# Patient Record
Sex: Female | Born: 1980 | Race: White | Hispanic: No | Marital: Married | Smoking: Current every day smoker
Health system: Southern US, Community
[De-identification: ages and names within clinical notes are randomized; demographics above are authoritative.]

## PROBLEM LIST (undated history)

## (undated) DIAGNOSIS — F1110 Opioid abuse, uncomplicated: Secondary | ICD-10-CM

## (undated) DIAGNOSIS — G8929 Other chronic pain: Secondary | ICD-10-CM

## (undated) DIAGNOSIS — R011 Cardiac murmur, unspecified: Secondary | ICD-10-CM

## (undated) DIAGNOSIS — J189 Pneumonia, unspecified organism: Secondary | ICD-10-CM

## (undated) DIAGNOSIS — F1490 Cocaine use, unspecified, uncomplicated: Secondary | ICD-10-CM

## (undated) DIAGNOSIS — F1111 Opioid abuse, in remission: Secondary | ICD-10-CM

## (undated) DIAGNOSIS — F419 Anxiety disorder, unspecified: Secondary | ICD-10-CM

## (undated) DIAGNOSIS — M549 Dorsalgia, unspecified: Secondary | ICD-10-CM

## (undated) DIAGNOSIS — N809 Endometriosis, unspecified: Secondary | ICD-10-CM

## (undated) DIAGNOSIS — E876 Hypokalemia: Secondary | ICD-10-CM

## (undated) DIAGNOSIS — A6009 Herpesviral infection of other urogenital tract: Secondary | ICD-10-CM

## (undated) DIAGNOSIS — R102 Pelvic and perineal pain: Secondary | ICD-10-CM

## (undated) DIAGNOSIS — R51 Headache: Secondary | ICD-10-CM

## (undated) DIAGNOSIS — N289 Disorder of kidney and ureter, unspecified: Secondary | ICD-10-CM

## (undated) DIAGNOSIS — R519 Headache, unspecified: Secondary | ICD-10-CM

## (undated) HISTORY — PX: TUBAL LIGATION: SHX77

## (undated) HISTORY — DX: Herpesviral infection of other urogenital tract: A60.09

## (undated) HISTORY — PX: CARPAL TUNNEL RELEASE: SHX101

## (undated) HISTORY — PX: TONSILLECTOMY: SUR1361

---

## 2000-08-01 ENCOUNTER — Inpatient Hospital Stay (HOSPITAL_COMMUNITY): Admission: AD | Admit: 2000-08-01 | Discharge: 2000-08-01 | Payer: Self-pay | Admitting: *Deleted

## 2001-08-21 ENCOUNTER — Encounter: Payer: Self-pay | Admitting: Emergency Medicine

## 2001-08-21 ENCOUNTER — Observation Stay (HOSPITAL_COMMUNITY): Admission: EM | Admit: 2001-08-21 | Discharge: 2001-08-21 | Payer: Self-pay | Admitting: Emergency Medicine

## 2003-01-28 ENCOUNTER — Emergency Department (HOSPITAL_COMMUNITY): Admission: EM | Admit: 2003-01-28 | Discharge: 2003-01-28 | Payer: Self-pay | Admitting: Emergency Medicine

## 2003-08-06 ENCOUNTER — Ambulatory Visit (HOSPITAL_COMMUNITY): Admission: AD | Admit: 2003-08-06 | Discharge: 2003-08-06 | Payer: Self-pay | Admitting: Obstetrics and Gynecology

## 2003-09-18 ENCOUNTER — Inpatient Hospital Stay (HOSPITAL_COMMUNITY): Admission: RE | Admit: 2003-09-18 | Discharge: 2003-09-20 | Payer: Self-pay | Admitting: Obstetrics and Gynecology

## 2003-10-23 ENCOUNTER — Ambulatory Visit (HOSPITAL_COMMUNITY): Admission: RE | Admit: 2003-10-23 | Discharge: 2003-10-23 | Payer: Self-pay | Admitting: Obstetrics and Gynecology

## 2005-02-13 ENCOUNTER — Ambulatory Visit: Payer: Self-pay | Admitting: Cardiology

## 2005-03-10 HISTORY — PX: ABDOMINAL HYSTERECTOMY: SHX81

## 2005-08-13 ENCOUNTER — Observation Stay (HOSPITAL_COMMUNITY): Admission: RE | Admit: 2005-08-13 | Discharge: 2005-08-14 | Payer: Self-pay | Admitting: Obstetrics & Gynecology

## 2005-08-13 ENCOUNTER — Encounter (INDEPENDENT_AMBULATORY_CARE_PROVIDER_SITE_OTHER): Payer: Self-pay | Admitting: Specialist

## 2006-02-18 ENCOUNTER — Ambulatory Visit: Payer: Self-pay | Admitting: Cardiology

## 2006-03-11 ENCOUNTER — Ambulatory Visit: Payer: Self-pay | Admitting: Cardiology

## 2006-05-23 ENCOUNTER — Emergency Department (HOSPITAL_COMMUNITY): Admission: EM | Admit: 2006-05-23 | Discharge: 2006-05-23 | Payer: Self-pay | Admitting: Emergency Medicine

## 2006-06-22 ENCOUNTER — Emergency Department (HOSPITAL_COMMUNITY): Admission: EM | Admit: 2006-06-22 | Discharge: 2006-06-22 | Payer: Self-pay | Admitting: Emergency Medicine

## 2006-08-05 ENCOUNTER — Ambulatory Visit (HOSPITAL_COMMUNITY): Admission: RE | Admit: 2006-08-05 | Discharge: 2006-08-05 | Payer: Self-pay | Admitting: Obstetrics and Gynecology

## 2006-08-30 ENCOUNTER — Emergency Department (HOSPITAL_COMMUNITY): Admission: EM | Admit: 2006-08-30 | Discharge: 2006-08-30 | Payer: Self-pay | Admitting: Emergency Medicine

## 2006-09-29 ENCOUNTER — Ambulatory Visit (HOSPITAL_BASED_OUTPATIENT_CLINIC_OR_DEPARTMENT_OTHER): Admission: RE | Admit: 2006-09-29 | Discharge: 2006-09-29 | Payer: Self-pay | Admitting: Orthopedic Surgery

## 2007-01-20 ENCOUNTER — Ambulatory Visit (HOSPITAL_BASED_OUTPATIENT_CLINIC_OR_DEPARTMENT_OTHER): Admission: RE | Admit: 2007-01-20 | Discharge: 2007-01-20 | Payer: Self-pay | Admitting: Orthopedic Surgery

## 2008-03-10 HISTORY — PX: CHOLECYSTECTOMY: SHX55

## 2008-08-28 ENCOUNTER — Emergency Department (HOSPITAL_COMMUNITY): Admission: EM | Admit: 2008-08-28 | Discharge: 2008-08-28 | Payer: Self-pay | Admitting: Emergency Medicine

## 2008-10-27 ENCOUNTER — Inpatient Hospital Stay (HOSPITAL_COMMUNITY): Admission: EM | Admit: 2008-10-27 | Discharge: 2008-10-31 | Payer: Self-pay | Admitting: Emergency Medicine

## 2008-10-30 ENCOUNTER — Encounter (INDEPENDENT_AMBULATORY_CARE_PROVIDER_SITE_OTHER): Payer: Self-pay | Admitting: General Surgery

## 2008-11-20 ENCOUNTER — Emergency Department (HOSPITAL_COMMUNITY): Admission: EM | Admit: 2008-11-20 | Discharge: 2008-11-20 | Payer: Self-pay | Admitting: Emergency Medicine

## 2008-12-17 ENCOUNTER — Emergency Department (HOSPITAL_COMMUNITY): Admission: EM | Admit: 2008-12-17 | Discharge: 2008-12-17 | Payer: Self-pay | Admitting: Emergency Medicine

## 2009-09-11 ENCOUNTER — Emergency Department (HOSPITAL_COMMUNITY): Admission: EM | Admit: 2009-09-11 | Discharge: 2009-09-11 | Payer: Self-pay | Admitting: Emergency Medicine

## 2009-10-18 ENCOUNTER — Emergency Department (HOSPITAL_COMMUNITY): Admission: EM | Admit: 2009-10-18 | Discharge: 2009-10-18 | Payer: Self-pay | Admitting: Emergency Medicine

## 2009-11-04 ENCOUNTER — Emergency Department (HOSPITAL_COMMUNITY): Admission: EM | Admit: 2009-11-04 | Discharge: 2009-11-04 | Payer: Self-pay | Admitting: Emergency Medicine

## 2009-11-20 ENCOUNTER — Emergency Department (HOSPITAL_COMMUNITY): Admission: EM | Admit: 2009-11-20 | Discharge: 2009-11-20 | Payer: Self-pay | Admitting: Emergency Medicine

## 2009-11-30 ENCOUNTER — Emergency Department (HOSPITAL_COMMUNITY): Admission: EM | Admit: 2009-11-30 | Discharge: 2009-11-30 | Payer: Self-pay | Admitting: Emergency Medicine

## 2009-12-07 ENCOUNTER — Emergency Department (HOSPITAL_COMMUNITY): Admission: EM | Admit: 2009-12-07 | Discharge: 2009-12-07 | Payer: Self-pay | Admitting: Emergency Medicine

## 2009-12-10 ENCOUNTER — Ambulatory Visit (HOSPITAL_COMMUNITY): Admission: RE | Admit: 2009-12-10 | Discharge: 2009-12-10 | Payer: Self-pay | Admitting: Obstetrics and Gynecology

## 2010-01-04 ENCOUNTER — Emergency Department (HOSPITAL_COMMUNITY): Admission: EM | Admit: 2010-01-04 | Discharge: 2010-01-04 | Payer: Self-pay | Admitting: Emergency Medicine

## 2010-02-01 ENCOUNTER — Emergency Department (HOSPITAL_COMMUNITY): Admission: EM | Admit: 2010-02-01 | Discharge: 2010-02-01 | Payer: Self-pay | Admitting: Emergency Medicine

## 2010-02-02 ENCOUNTER — Emergency Department (HOSPITAL_COMMUNITY): Admission: EM | Admit: 2010-02-02 | Discharge: 2010-02-02 | Payer: Self-pay | Admitting: Emergency Medicine

## 2010-02-07 ENCOUNTER — Emergency Department (HOSPITAL_COMMUNITY)
Admission: EM | Admit: 2010-02-07 | Discharge: 2010-02-07 | Payer: Self-pay | Source: Home / Self Care | Admitting: Emergency Medicine

## 2010-05-20 LAB — RAPID URINE DRUG SCREEN, HOSP PERFORMED
Amphetamines: NOT DETECTED
Barbiturates: NOT DETECTED
Cocaine: NOT DETECTED
Opiates: NOT DETECTED
Tetrahydrocannabinol: NOT DETECTED

## 2010-05-20 LAB — BASIC METABOLIC PANEL
BUN: 2 mg/dL — ABNORMAL LOW (ref 6–23)
Chloride: 98 mEq/L (ref 96–112)
GFR calc Af Amer: >60 mL/min (ref >60)
Glucose, Bld: 121 mg/dL — ABNORMAL HIGH (ref 70–99)
Potassium: 2.8 mEq/L — ABNORMAL LOW (ref 3.5–5.1)

## 2010-05-20 LAB — DIFFERENTIAL
Basophils Absolute: 0 10*3/uL (ref 0.0–0.1)
Eosinophils Absolute: 0.2 10*3/uL (ref 0.0–0.7)
Eosinophils Relative: 2 % (ref 0–5)
Lymphocytes Relative: 18 % (ref 12–46)
Monocytes Absolute: 0.9 10*3/uL (ref 0.1–1.0)
Monocytes Relative: 8 % (ref 3–12)

## 2010-05-20 LAB — URINALYSIS, ROUTINE W REFLEX MICROSCOPIC
Bilirubin Urine: NEGATIVE
Glucose, UA: NEGATIVE mg/dL
Ketones, ur: NEGATIVE mg/dL
Specific Gravity, Urine: >1.03 — ABNORMAL HIGH (ref 1.005–1.030)
Urobilinogen, UA: 1 mg/dL (ref 0.0–1.0)
pH: 6.5 (ref 5.0–8.0)

## 2010-05-20 LAB — CBC
Hemoglobin: 15.3 g/dL — ABNORMAL HIGH (ref 12.0–15.0)
MCV: 98.8 fL (ref 78.0–100.0)
Platelets: 249 10*3/uL (ref 150–400)
RDW: 11.9 % (ref 11.5–15.5)
WBC: 11.6 10*3/uL — ABNORMAL HIGH (ref 4.0–10.5)

## 2010-05-23 LAB — SURGICAL PCR SCREEN
MRSA, PCR: NEGATIVE
Staphylococcus aureus: NEGATIVE

## 2010-05-23 LAB — BASIC METABOLIC PANEL
CO2: 28 mEq/L (ref 19–32)
Calcium: 8.9 mg/dL (ref 8.4–10.5)
Creatinine, Ser: 0.7 mg/dL (ref 0.4–1.2)
GFR calc non Af Amer: >60 mL/min (ref >60)
Sodium: 141 mEq/L (ref 135–145)

## 2010-05-23 LAB — CBC
HCT: 40.2 % (ref 36.0–46.0)
Platelets: 282 10*3/uL (ref 150–400)

## 2010-05-24 LAB — URINALYSIS, ROUTINE W REFLEX MICROSCOPIC
Hgb urine dipstick: NEGATIVE
Ketones, ur: NEGATIVE mg/dL
Nitrite: NEGATIVE
Protein, ur: NEGATIVE mg/dL
Specific Gravity, Urine: 1.02 (ref 1.005–1.030)
pH: 7 (ref 5.0–8.0)

## 2010-05-26 LAB — BASIC METABOLIC PANEL
BUN: 5 mg/dL — ABNORMAL LOW (ref 6–23)
CO2: 28 mEq/L (ref 19–32)
Calcium: 9 mg/dL (ref 8.4–10.5)
Chloride: 102 mEq/L (ref 96–112)
Creatinine, Ser: 0.6 mg/dL (ref 0.4–1.2)
GFR calc Af Amer: >60 mL/min (ref >60)
Potassium: 4.1 mEq/L (ref 3.5–5.1)

## 2010-05-26 LAB — CBC
Hemoglobin: 14.1 g/dL (ref 12.0–15.0)
MCH: 34.1 pg — ABNORMAL HIGH (ref 26.0–34.0)

## 2010-05-26 LAB — DIFFERENTIAL
Basophils Absolute: 0.1 10*3/uL (ref 0.0–0.1)
Basophils Relative: 1 % (ref 0–1)
Eosinophils Relative: 0 % (ref 0–5)
Monocytes Relative: 4 % (ref 3–12)
Neutro Abs: 5.8 10*3/uL (ref 1.7–7.7)

## 2010-05-26 LAB — POCT PREGNANCY, URINE: Preg Test, Ur: NEGATIVE

## 2010-05-26 LAB — URINALYSIS, ROUTINE W REFLEX MICROSCOPIC
Bilirubin Urine: NEGATIVE
Hgb urine dipstick: NEGATIVE
Ketones, ur: NEGATIVE mg/dL
Protein, ur: NEGATIVE mg/dL
pH: 5 (ref 5.0–8.0)

## 2010-05-26 LAB — HEPATIC FUNCTION PANEL
ALT: 11 U/L (ref 0–35)
AST: 15 U/L (ref 0–37)
Total Bilirubin: 0.7 mg/dL (ref 0.3–1.2)
Total Protein: 6.7 g/dL (ref 6.0–8.3)

## 2010-05-26 LAB — LIPASE, BLOOD: Lipase: 20 U/L (ref 11–59)

## 2010-06-13 LAB — BASIC METABOLIC PANEL
Calcium: 9.5 mg/dL (ref 8.4–10.5)
Chloride: 107 mEq/L (ref 96–112)
Glucose, Bld: 99 mg/dL (ref 70–99)

## 2010-06-13 LAB — CBC
HCT: 40.9 % (ref 36.0–46.0)
Hemoglobin: 14.1 g/dL (ref 12.0–15.0)
MCHC: 34.6 g/dL (ref 30.0–36.0)
MCV: 100.7 fL — ABNORMAL HIGH (ref 78.0–100.0)
Platelets: 308 10*3/uL (ref 150–400)
RBC: 4.06 MIL/uL (ref 3.87–5.11)
RDW: 13 % (ref 11.5–15.5)
WBC: 9.7 10*3/uL (ref 4.0–10.5)

## 2010-06-13 LAB — DIFFERENTIAL
Basophils Relative: 1 % (ref 0–1)
Eosinophils Absolute: 0 10*3/uL (ref 0.0–0.7)
Lymphs Abs: 1.9 10*3/uL (ref 0.7–4.0)
Monocytes Relative: 4 % (ref 3–12)
Neutrophils Relative %: 75 % (ref 43–77)

## 2010-06-14 LAB — DIFFERENTIAL
Basophils Relative: 0 % (ref 0–1)
Eosinophils Relative: 0 % (ref 0–5)
Monocytes Absolute: 0.3 10*3/uL (ref 0.1–1.0)
Monocytes Relative: 3 % (ref 3–12)
Neutro Abs: 9.3 10*3/uL — ABNORMAL HIGH (ref 1.7–7.7)

## 2010-06-14 LAB — BASIC METABOLIC PANEL
Chloride: 101 mEq/L (ref 96–112)
GFR calc Af Amer: >60 mL/min (ref >60)
Glucose, Bld: 101 mg/dL — ABNORMAL HIGH (ref 70–99)
Potassium: 4 mEq/L (ref 3.5–5.1)

## 2010-06-14 LAB — CBC
HCT: 43.6 % (ref 36.0–46.0)
Hemoglobin: 15.3 g/dL — ABNORMAL HIGH (ref 12.0–15.0)
MCHC: 34.9 g/dL (ref 30.0–36.0)
MCV: 100.8 fL — ABNORMAL HIGH (ref 78.0–100.0)
RBC: 4.33 MIL/uL (ref 3.87–5.11)

## 2010-06-15 LAB — CBC
HCT: 32.4 % — ABNORMAL LOW (ref 36.0–46.0)
HCT: 40.1 % (ref 36.0–46.0)
Hemoglobin: 11.5 g/dL — ABNORMAL LOW (ref 12.0–15.0)
MCHC: 35.1 g/dL (ref 30.0–36.0)
MCV: 101.3 fL — ABNORMAL HIGH (ref 78.0–100.0)
MCV: 101.8 fL — ABNORMAL HIGH (ref 78.0–100.0)
Platelets: 170 10*3/uL (ref 150–400)
Platelets: 190 10*3/uL (ref 150–400)
Platelets: 234 10*3/uL (ref 150–400)
RBC: 3.96 MIL/uL (ref 3.87–5.11)
RDW: 12.2 % (ref 11.5–15.5)
WBC: 12.1 10*3/uL — ABNORMAL HIGH (ref 4.0–10.5)
WBC: 6.9 10*3/uL (ref 4.0–10.5)
WBC: 8 10*3/uL (ref 4.0–10.5)

## 2010-06-15 LAB — BASIC METABOLIC PANEL
BUN: 1 mg/dL — ABNORMAL LOW (ref 6–23)
BUN: 5 mg/dL — ABNORMAL LOW (ref 6–23)
CO2: 27 mEq/L (ref 19–32)
Chloride: 105 mEq/L (ref 96–112)
Creatinine, Ser: 0.65 mg/dL (ref 0.4–1.2)
GFR calc non Af Amer: >60 mL/min (ref >60)
Glucose, Bld: 87 mg/dL (ref 70–99)
Potassium: 3.9 mEq/L (ref 3.5–5.1)
Sodium: 142 mEq/L (ref 135–145)

## 2010-06-15 LAB — HEPATIC FUNCTION PANEL
ALT: 10 U/L (ref 0–35)
AST: 11 U/L (ref 0–37)
Albumin: 3.4 g/dL — ABNORMAL LOW (ref 3.5–5.2)
Alkaline Phosphatase: 50 U/L (ref 39–117)
Indirect Bilirubin: 0.3 mg/dL (ref 0.3–0.9)
Indirect Bilirubin: 0.4 mg/dL (ref 0.3–0.9)
Total Protein: 5 g/dL — ABNORMAL LOW (ref 6.0–8.3)
Total Protein: 6 g/dL (ref 6.0–8.3)

## 2010-06-15 LAB — COMPREHENSIVE METABOLIC PANEL
BUN: 4 mg/dL — ABNORMAL LOW (ref 6–23)
CO2: 25 mEq/L (ref 19–32)
Chloride: 110 mEq/L (ref 96–112)
Creatinine, Ser: 0.49 mg/dL (ref 0.4–1.2)
GFR calc non Af Amer: >60 mL/min (ref >60)
Total Bilirubin: 0.7 mg/dL (ref 0.3–1.2)

## 2010-06-15 LAB — DIFFERENTIAL
Basophils Absolute: 0 10*3/uL (ref 0.0–0.1)
Basophils Absolute: 0.1 10*3/uL (ref 0.0–0.1)
Basophils Relative: 0 % (ref 0–1)
Eosinophils Absolute: 0.1 10*3/uL (ref 0.0–0.7)
Eosinophils Relative: 0 % (ref 0–5)
Eosinophils Relative: 1 % (ref 0–5)
Lymphocytes Relative: 29 % (ref 12–46)
Lymphocytes Relative: 38 % (ref 12–46)
Lymphs Abs: 2.6 10*3/uL (ref 0.7–4.0)
Neutro Abs: 5.2 10*3/uL (ref 1.7–7.7)
Neutrophils Relative %: 71 % (ref 43–77)

## 2010-06-15 LAB — URINALYSIS, ROUTINE W REFLEX MICROSCOPIC
Glucose, UA: NEGATIVE mg/dL
Hgb urine dipstick: NEGATIVE
Protein, ur: NEGATIVE mg/dL

## 2010-06-15 LAB — LIPASE, BLOOD: Lipase: 23 U/L (ref 11–59)

## 2010-07-05 ENCOUNTER — Emergency Department (HOSPITAL_COMMUNITY)
Admission: EM | Admit: 2010-07-05 | Discharge: 2010-07-05 | Disposition: A | Discharge status: Home / Self Care | Payer: BC Managed Care – PPO | Attending: Emergency Medicine | Admitting: Emergency Medicine

## 2010-07-05 DIAGNOSIS — R51 Headache: Secondary | ICD-10-CM | POA: Insufficient documentation

## 2010-07-05 DIAGNOSIS — F3289 Other specified depressive episodes: Secondary | ICD-10-CM | POA: Insufficient documentation

## 2010-07-05 DIAGNOSIS — F329 Major depressive disorder, single episode, unspecified: Secondary | ICD-10-CM | POA: Insufficient documentation

## 2010-07-05 DIAGNOSIS — F411 Generalized anxiety disorder: Secondary | ICD-10-CM | POA: Insufficient documentation

## 2010-07-15 ENCOUNTER — Emergency Department (HOSPITAL_COMMUNITY)
Admission: EM | Admit: 2010-07-15 | Discharge: 2010-07-15 | Disposition: A | Discharge status: Home / Self Care | Payer: BC Managed Care – PPO | Attending: Emergency Medicine | Admitting: Emergency Medicine

## 2010-07-15 DIAGNOSIS — F411 Generalized anxiety disorder: Secondary | ICD-10-CM | POA: Insufficient documentation

## 2010-07-15 DIAGNOSIS — B370 Candidal stomatitis: Secondary | ICD-10-CM | POA: Insufficient documentation

## 2010-07-22 ENCOUNTER — Emergency Department (HOSPITAL_COMMUNITY): Payer: BC Managed Care – PPO

## 2010-07-22 ENCOUNTER — Emergency Department (HOSPITAL_COMMUNITY)
Admission: EM | Admit: 2010-07-22 | Discharge: 2010-07-22 | Disposition: A | Discharge status: Home / Self Care | Payer: BC Managed Care – PPO | Attending: Emergency Medicine | Admitting: Emergency Medicine

## 2010-07-22 DIAGNOSIS — M25579 Pain in unspecified ankle and joints of unspecified foot: Secondary | ICD-10-CM | POA: Insufficient documentation

## 2010-07-22 DIAGNOSIS — M25473 Effusion, unspecified ankle: Secondary | ICD-10-CM | POA: Insufficient documentation

## 2010-07-22 DIAGNOSIS — X500XXA Overexertion from strenuous movement or load, initial encounter: Secondary | ICD-10-CM | POA: Insufficient documentation

## 2010-07-22 DIAGNOSIS — F341 Dysthymic disorder: Secondary | ICD-10-CM | POA: Insufficient documentation

## 2010-07-22 DIAGNOSIS — S93409A Sprain of unspecified ligament of unspecified ankle, initial encounter: Secondary | ICD-10-CM | POA: Insufficient documentation

## 2010-07-22 DIAGNOSIS — Z9889 Other specified postprocedural states: Secondary | ICD-10-CM | POA: Insufficient documentation

## 2010-07-22 DIAGNOSIS — M25476 Effusion, unspecified foot: Secondary | ICD-10-CM | POA: Insufficient documentation

## 2010-07-22 DIAGNOSIS — Z79899 Other long term (current) drug therapy: Secondary | ICD-10-CM | POA: Insufficient documentation

## 2010-07-22 DIAGNOSIS — Y92009 Unspecified place in unspecified non-institutional (private) residence as the place of occurrence of the external cause: Secondary | ICD-10-CM | POA: Insufficient documentation

## 2010-07-23 NOTE — Op Note (Signed)
NAMEJONELL, [name]                  ACCOUNT NO.:  [id number]   MEDICAL RECORD NO.:  [id number]          PATIENT TYPE:  AMB   LOCATION:  DSC                          FACILITY:  [facility]   PHYSICIAN:  Cindee Salt, M.D.       DATE OF BIRTH:  05-03-1980   DATE OF PROCEDURE:  01/20/2007  DATE OF DISCHARGE:  01/20/2007                               OPERATIVE REPORT   PREOPERATIVE DIAGNOSIS:  Carpal tunnel syndrome, right hand.   POSTOPERATIVE DIAGNOSIS:  Carpal tunnel syndrome, right hand.   OPERATION:  Decompression of right median nerve.   SURGEON:  Cindee Salt, M.D.   ANESTHESIA:  General.   ANESTHESIOLOGIST:  Kaylyn Layer. Michelle Piper, M.D.   HISTORY:  The patient is a 30 year old female with history of carpal  tunnel syndrome, EMG nerve conductions positive.  This has not responded  to conservative treatment.  She has undergone release on the left side  and is admitted now for release on the right.  In the preoperative area  the patient is seen, questions encouraged and answered, extremity marked  by both the patient and surgeon.  Antibiotic given.   PROCEDURE:  The patient is brought to the operating room where forearm  based IV regional anesthesia was attempted.  This was unsuccessful and  LMA general anesthetic was then used.  She was prepped using DuraPrep,  supine position, right arm free.  The limb was exsanguinated with an  Esmarch bandage, tourniquet on the forearm was inflated to 200 mmHg.  Straight incision was made longitudinally in palm carried down through  subcutaneous tissue.  Bleeders were electrocauterized, palmar fascia was  split, superficial palmar arch identified, flexor tendon of the ring and  little finger identified to the ulnar side of median nerve.  Carpal  retinaculum was incised with sharp dissection, right angle and Sewall  retractor were placed between skin and forearm fascia.  The fascia  released for approximately 1.5 cm proximal to the wrist crease under  direct vision.  The canal was explored.  Area of compression to the  nerve was apparent.  No further lesions were identified.  The wound was  irrigated.  The skin was then closed with interrupted 5-0 Vicryl Rapide  sutures.  A sterile compressive dressing and splint to the wrist  applied, fingers free.  The patient tolerated the procedure well and was  taken to the recovery room for observation in satisfactory condition.  She will be discharged home to return to hand surgeon in [place] in 1  week on Vicodin.           ______________________________  Cindee Salt, M.D.     GK/MEDQ  D:  01/20/2007  T:  01/21/2007  Job:  [id number]   cc:   Kingsley Callander. Ouida Sills, MD

## 2010-07-23 NOTE — Op Note (Signed)
[name], [name]                  ACCOUNT NO.:  [id number]   MEDICAL RECORD NO.:  [id number]          PATIENT TYPE:  INP   LOCATION:  A312                          FACILITY:  [facility]   PHYSICIAN:  Barbaraann Barthel, M.D. DATE OF BIRTH:  [birth year]   DATE OF PROCEDURE:  10/30/2008  DATE OF DISCHARGE:                               OPERATIVE REPORT   PREOPERATIVE DIAGNOSIS:  Acute cholecystitis and cholelithiasis.   POSTOPERATIVE DIAGNOSIS:  Acute cholecystitis and cholelithiasis   PROCEDURE:  Laparoscopic cholecystectomy.   Note, this is a 30 year old white female who was admitted through the  emergency room with severe right upper quadrant pain.  She had multiple  episodes of this.  Sonogram revealed cholelithiasis.  There was evidence  of gallstones, but no biliary duct dilatation.  There were no active  findings.  No definite gallbladder wall thickening; however, the patient  had considerable pain requiring Dilaudid for pain control.  She was  admitted.  IVs were initiated and antibiotics and we followed her  clinically until her pain somewhat diminished and then planned for  surgery on her on Monday morning and after discussing complications of  surgery not limited to, but including bleeding, infection, damage to  bile ducts, perforation of organs and transitory diarrhea.   GROSS OPERATIVE FINDINGS:  Consistent with acute cholecystitis.  She had  some edema of the gallbladder wall.  There were stones also within the  gallbladder.  The liver also appeared to have fatty infiltration.  There  was a small cystic duct, which was not cannulated for cholangiogram.   A wound classification contaminated because of diagnosis of acute  cholecystitis.   SPECIMEN:  Gallbladder and stones.   TECHNIQUE:  The patient was placed in supine position.  After adequate  administration of general anesthesia via endotracheal intubation, her  entire abdomen was prepped with Betadine solution and draped  in the  usual manner.  Prior to this, Foley catheter was aseptically inserted.  A periumbilical incision was carried out over the superior aspect of the  umbilicus.  The fascia was grasped with a sharp towel clip and elevated  and a Veress needle was inserted and confirmed the position with a  saline drop test.  We insufflated the abdomen and then using the  Visiport technique placed a 11 mm cannula in the umbilicus.  We noted  considerable air bubbles within the parietal peritoneum obviously during  insufflation.  The Veress needle that had dissected around the hepatic  ligament and the parietal peritoneum, but there was no bowel dilatation  or any sign of any mishap from the insertion other than just the gas.  We proceeded to take the gallbladder out grasping it and clearly  visualizing the cystic duct and clipping this 3 times and dividing this  as well as the cystic artery.  The gallbladder was removed from the  liver bed without spillage and using the EndoCatch device was removed  through the epigastric incision.  We checked for hemostasis.  We then  elected to leave a Jackson-Pratt drain in the liver bed as well  as a  piece of Surgicel.  Again, there was no signs of any bowel mishap on  cannulization of the abdomen.  I put the camera in the epigastric  incision after the procedure and carefully looked at the abdomen and  there was no signs of any succus entericus or anything other than the  above-mentioned air bubbles from the parietal peritoneum.  We then  desufflated the abdomen and closed the fascia in the area of the  umbilicus with O Polysorb suture.  There was a very small cannula, a  hole in the epigastric area, this was not closed.  I used 0.5%  Sensorcaine around port sites and then closed all the wounds with a  stapling device.  The drain was sutured in place with 3-0 nylon. Prior  to closure, all sponge, needle, instrument counts found to be correct.  Estimated blood loss  was minimal.  The patient received a liter of  crystalloids intraoperatively and as stated one Jackson-Pratt drain was  left in the liver bed.  Prior to closure, all sponge, needle, and  instrument counts were accounted for.      Barbaraann Barthel, M.D.  Electronically Signed     WB/MEDQ  D:  10/30/2008  T:  10/31/2008  Job:  [id number]

## 2010-07-23 NOTE — H&P (Signed)
NAMEDAPHNE, KARRER                  ACCOUNT NO.:  [id number]   MEDICAL RECORD NO.:  [id number]          PATIENT TYPE:  INP   LOCATION:  A312                          FACILITY:  [facility]   PHYSICIAN:  Barbaraann Barthel, M.D. DATE OF BIRTH:  02/04/1981   DATE OF ADMISSION:  10/27/2008  DATE OF DISCHARGE:  LH                              HISTORY & PHYSICAL   Surgeon was asked to see this 30 year old white female with recurrent  right upper quadrant pain.  The chief complaint is that of postprandial  right upper quadrant pain accompanied with nausea and vomiting.  She has  had several episodes of this and her latest one has been worse today.  Clinically, this pain is localized in the right upper quadrant and  radiates to her back and has occurred after eating a greasy meal.   PHYSICAL EXAMINATION:  GENERAL:  A pleasant 30 year old white female,  who is uncomfortable, but in no acute distress.  VITAL SIGNS:  She is 5 feet 1 inch tall, weighs 48.2 kg, her temperature  is 97.6, blood pressure 99/52, heart rate 47, respirations 20, and O2  sat on room air is 99%.  HEENT:  Head is normocephalic.  Eyes, extraocular movements are intact.  Pupils are round and reactive to light and accommodation.  There is no  conjunctive pallor or scleral injection.  The sclera is a normal  tincture.  NECK:  Supple and cylindrical without jugular vein distention,  thyromegaly, or tracheal deviation.  There is no adenopathy.  CHEST:  Clear to both anterior and posterior auscultation.  HEART:  Regular rhythm.  BREASTS AND AXILLA:  Without masses.  ABDOMEN:  Tender in the right upper quadrant.  Bowel sounds are somewhat  diminished.  There are no femoral or inguinal hernias.  The patient has  had previous perforation, piercing of her umbilicus.  RECTAL:  Stool is guaiac-negative.  EXTREMITIES:  Within normal limits.  She has a past history of a  fracture of her left ankle.   REVIEW OF SYSTEMS:  GI SYSTEM:  The  right upper quadrant postprandial  pain with nausea and vomiting.  She has had several episodes of this,  this is the worse today.  She has had no past history of hepatitis, no  past history of bright red rectal bleeding, or black tarry stools, and  no unexplained weight loss.  No past history of hepatitis.  GU SYSTEM:  No history of nephrolithiasis, although her family has had history and  she has no history of dysuria.  ENDOCRINE SYSTEM:  No history of  diabetes or thyroid disease.  MUSCULOSKELETAL SYSTEM:  Grossly within  normal limits.  CARDIORESPIRATORY SYSTEM:  The patient has had a  childhood murmur in the past; however, there is no murmur at present.   SOCIAL HISTORY:  She smokes a pack of cigarettes per day.   OB/GYN HISTORY:  She is a gravida 4, para 4, aborta 0, cesarean 0  female, who has had in the past a tubal ligation in 2007 and in 2008,  had a total abdominal hysterectomy and  right salpingo-oophorectomy.   No past history of carcinoma of the breast in any first-line relatives.   MEDICATIONS:  As stated medications include Ativan for anxiety and she  takes Ambien to help her sleep at night.   ALLERGIES:  She has no known allergies.   LABORATORY DATA:  Sonogram shows cholelithiasis.  No obvious fluid  noted.  Around the gallbladder, metabolic 7 is grossly within normal  limits.  BUN is 4 with creatinine to 0.49.  Liver function studies are  within normal limits.  Bilirubin is 0.7.  White count is 8.0 with an H  and H of 14.3 and 40.1 with 66% neutrophilia.  Lipase is not elevated.   IMPRESSION:  Ms. Lembo is a 30 year old white female with recurrent  episodes of biliary colic.  She has had to have narcotics in the  emergency room for pain and she still had considerable pain and she is  admitted for that reason and we will plan to hopefully do her surgery  during this hospitalization.  Hydration has been continued.  She has  been made n.p.o. except for sips of clears  and for medicines and  antibiotics have been initiated.  We will plan for surgery hopefully  during this hospitalization.  We discussed surgery in detail with her  discussing complications not limited to but including bleeding,  infection, damage to bile ducts, perforation of organs, transitory  diarrhea, and the possibility of an open surgery might be required.  Informed consent was obtained.  I discussed the surgery in detail with  the patient and her husband, and all questions were answered, and  informed consent was obtained.      Barbaraann Barthel, M.D.  Electronically Signed     WB/MEDQ  D:  10/27/2008  T:  10/28/2008  Job:  [id number]

## 2010-07-23 NOTE — Discharge Summary (Signed)
[name], [name]                  ACCOUNT NO.:  [id number]   MEDICAL RECORD NO.:  [id number]          PATIENT TYPE:  INP   LOCATION:  A312                          FACILITY:  [facility]   PHYSICIAN:  Barbaraann Barthel, M.D. DATE OF BIRTH:  26-Sep-1980   DATE OF ADMISSION:  10/27/2008  DATE OF DISCHARGE:  08/24/2010LH                               DISCHARGE SUMMARY   DIAGNOSIS:  Acute cholecystitis.   PROCEDURE:  Laparoscopic cholecystectomy on October 30, 2008.   Note, this is a 30 year old white female who came to the emergency room  with recurrent right upper quadrant pain, nausea, and vomiting.  She was  seen in the emergency room and admitted.  Antibiotics were initiated and  hydration was initiated, the patient improved.  She still had  considerable right upper quadrant pain.  Her laboratory data did not  show any elevation of her liver function studies, her high white count,  and her labs appeared fairly unremarkable.  However, she had persistent  right upper quadrant pain requiring a parenteral analgesia and this got  better with the above-mentioned treatment and we took her to surgery on  October 30, 2008, at which time a laparoscopic cholecystectomy was  performed.  We found multiple stones within the Hartmann's pouch of the  gallbladder and the patient had an uneventful surgery.  On the following  postoperative day, she was tolerating p.o. well.  Her pain was much  resolved.  She had minimal serous JP drainage.  Her abdomen was soft and  she was doing well.  At the time of discharge, her wounds were clean.  We removed her Jackson-Pratt drain and she was discharged.  She remained  afebrile in the postoperative period without shortness of breath, leg  pain, or dysuria.   LABORATORY DATA:  Her white count postoperatively was 12.1, slightly  leukemoid reaction from postsurgery.  Her lytes are within normal  limits, as was her liver function studies.  Sonogram revealed  cholelithiasis  as mentioned.   DISCHARGE INSTRUCTIONS:  Continuing her medications at home which were  Ativan for her anxiety and Ambien 10 mg as needed for bedtime.  She was  discharged with Percocet 5/325 every 4-6 hours for pain and Colace 100  mg p.o. daily or every other day as needed.  She is told to avoid any  aspirin products.  Rest of discharge instructions, she is excused from  work.  She is told to clean her wound with alcohol 2-3 times a day.  She  is told to do no heavy lifting, no straining, no driving, no sex until  further instructed.  She is permitted to shower and not take a tub bath  or go in the swimming pool.  Her diet is full liquids and soft diet as  needed for the first few days and to keep her wound clean with alcohol 3  times a day and after her shower.  She is told to dress her drain site  as needed.  She is given our telephone number, told to call us, should  there be any acute  changes, or go to the emergency room.      Barbaraann Barthel, M.D.  Electronically Signed     WB/MEDQ  D:  10/31/2008  T:  11/01/2008  Job:  [id number]

## 2010-07-23 NOTE — Op Note (Signed)
[name], [name]                  ACCOUNT NO.:  [id number]   MEDICAL RECORD NO.:  [id number]          PATIENT TYPE:  AMB   LOCATION:  DSC                          FACILITY:  [facility]   PHYSICIAN:  Cindee Salt, M.D.       DATE OF BIRTH:  08/25/1980   DATE OF PROCEDURE:  09/29/2006  DATE OF DISCHARGE:                               OPERATIVE REPORT   PREOPERATIVE DIAGNOSIS:  Carpal tunnel syndrome left hand.   POSTOPERATIVE DIAGNOSIS:  Carpal tunnel syndrome left hand.   OPERATION:  Decompression left median nerve.   SURGEON:  Cindee Salt, M.D.   ASSISTANT:  Joaquin Courts, R.N.   ANESTHESIA:  Forearm based IV regional.   HISTORY:  The patient is a 30 year old female with a history of carpal  tunnel syndrome, EMG nerve conductions positive.  This has not responded  to conservative treatment.  She is desirous having this surgically  released.  Pre, peri, and postoperative course have been discussed along  with risks and complications.  She is aware there is no guarantee with  the surgery, possibility of infection, recurrence, injury to arteries,  nerves, tendons, incomplete relief of symptoms, dystrophy.  In the  preoperative area, the patient is seen, the extremity marked by both  patient and surgeon, questions encouraged and answered.   PROCEDURE:  The patient is brought to the operating room where a forearm  based IV regional anesthetic was carried out without difficulty.  She  was prepped using DuraPrep, supine position, left arm free.  A  longitudinal incision was made in the palm and carried down through  subcutaneous tissues.  Bleeders were electrocauterized, the palmar  fascia was split, superficial palmar arch identified, flexor tendon in  the ring and little finger identified. To the ulnar side of the median  nerve, the carpal retinaculum was incised with sharp dissection. Right  angle and Sewall retractor were placed between skin and forearm fascia.  The fascia was released  for approximately 1.5 cm proximal to the wrist  crease under direct vision.  The canal was explored.  Tenosynovium  tissue was moderately thickened.  The area of compression of the nerve  was apparent.  No further lesions were identified.  The wound was  irrigated.  The skin was then closed interrupted 5-0 Vicryl Rapide  sutures.  A sterile compressive dressing and splint with the fingers  free was applied.  The patient tolerated the procedure well and was  taken to the recovery room for observation in satisfactory condition.  She is discharged home to return to the [facility] [place] in one  week on Vicodin.          ______________________________  Cindee Salt, M.D.    GK/MEDQ  D:  09/29/2006  T:  09/29/2006  Job:  [id number]   cc:   Kingsley Callander. Ouida Sills, MD

## 2010-07-26 NOTE — Op Note (Signed)
NAMEBRIANTE, LOVEALL                              ACCOUNT NO.:  [id number]   MEDICAL RECORD NO.:  [id number]                  PATIENT TYPE:   LOCATION:  LD2                                  FACILITY:   PHYSICIAN:  Langley Gauss, M.D.                DATE OF BIRTH:   DATE OF PROCEDURE:  09/18/2003  DATE OF DISCHARGE:                                 OPERATIVE REPORT   PROCEDURE:  Placement of continuous lumbar epidural analgesia; start time  0330. Placed by Dr. Roylene Reason. Lisette Grinder.   COMPLICATIONS:  None.   SUMMARY:  Appropriate and informed consent was obtained.  The patient had  had epidural placed with 2 of her prior labor deliveries.  The patient was  placed in the seated position.  Continuous electronic fetal monitoring is  performed utilizing fetal scalp electrode.  In a seated position the bony  landmarks were identified.  The L3-L4 interspace was chosen.  The patient's  back is sterilely prepped and draped utilizing the epidural tray; 5 cc of 1%  lidocaine plain injected at this site to raise a small skin wheal.   A 17-gauge Touhy-Schliff needle was then utilized with loss of resistance in  air-filled glass syringe to identify entry into the epidural space.  On the  first attempt the bony spinous was encountered, so the epidural needle was  removed and placed slightly caudad and slightly more to the patient's left.  On the second attempt, there was excellent loss of resistance consistent  with entry into the epidural space; 5 cc of 1.5% lidocaine plus epinephrine  injected through the epidural needle.  No signs of CSF or intravascular  injection obtained.  Thus the epidural catheter is inserted to a depth of 5  cm.  Epidural needle is removed.  Aspiration test is negative.  Second test  dose of 2 cc of 1.5% lidocaine plus epinephrine injected through the  epidural line.  Again, no signs of CSF or intravascular injection obtained.   Thus, the patient is connected to the infusion  pump containing the Fentanyl  mixture.  She is treated with a continuous infusion rate of 14 cc per hour.  She, in addition, is given a bolus of 5 cc of 2% lidocaine plain to rapidly  place an adequate block.      ___________________________________________                                            Langley Gauss, M.D.   DC/MEDQ  D:  09/18/2003  T:  09/18/2003  Job:  [id number]   cc:   Langley Gauss, M.D.  [address]  [place]  [place] [address]  Fax: [phone]   [facility]

## 2010-07-26 NOTE — H&P (Signed)
NAME:  [name], [name]                            ACCOUNT NO.:  [id number]   MEDICAL RECORD NO.:  [id number]                   PATIENT TYPE:  AMB   LOCATION:  DAY                                  FACILITY:  [facility]   PHYSICIAN:  [name], M.D.              DATE OF BIRTH:  03/07/1981   DATE OF ADMISSION:  10/23/2003  DATE OF DISCHARGE:                                HISTORY & PHYSICAL   ADMISSION DIAGNOSIS:  Desire for elective permanent sterilization.   HISTORY OF PRESENT ILLNESS:  This 30 year old female, gravida 4, para 4,  recently status post vaginal delivery performed September 18, 2003, is admitted  at this time for elective permanent sterilization.  [name] has acknowledged  her desire for permanent sterilization, has signed Medicaid consent form  September 20, 2003, and is admitted at this time for the tubal ligation.  Permanency of procedure has been emphasized to the patient who acknowledges  a 1 in 100 failure rate.  Technical aspects of the procedure include  laparoscopic technique discussed at our office visit.   PAST MEDICAL HISTORY:  Benign.   PAST SURGICAL HISTORY:  Negative.   OBSTETRIC HISTORY:  1. Four vaginal deliveries, [facility] x 3.  2. Postpartum depression after third pregnancy, none noted at time of this     care for this pregnancy, and postpartum was similarly uneventful.   PHYSICAL EXAMINATION:  GENERAL:  Healthy Caucasian female.  VITAL SIGNS:  Weight 155, blood pressure 100/62.  HEENT:  Pupils equal, round, and reactive to light.  Extraocular movements  intact.  NECK:  Supple.  ABDOMEN:  Nontender.  PELVIC:  External genitalia multiparous without lesions.  Vaginal exam:  Normal secretions.  Cervix closed, multiparous.  Uterus within normal limits  for postpartum state.  Adnexa nontender without masses.   The patient is breast feeding, has not been sexually active since delivery,  and husband is doubling their sterility by obtaining a vasectomy.   PLAN:  Laparoscopic tubal sterilization, Falope rings, October 23, 2003.     ___________________________________________                                         [name], M.D.   JVF/MEDQ  D:  10/22/2003  T:  10/22/2003  Job:  [id number]

## 2010-07-26 NOTE — Op Note (Signed)
NAME:  AHLIYA, GLATT                            ACCOUNT NO.:  [id number]   MEDICAL RECORD NO.:  [id number]                   PATIENT TYPE:  INP   LOCATION:  A417                                 FACILITY:  [facility]   PHYSICIAN:  [name], M.D.              DATE OF BIRTH:  1980-07-03   DATE OF PROCEDURE:  09/18/2003  DATE OF DISCHARGE:                                 OPERATIVE REPORT   DELIVERY NOTE:  Louiza progressed steadily through labor and developed an  urge to push at approximately 0855.  After a two contraction second stage,  she delivered a viable female infant at 0900.  The mouth and nose were  suctioned with a bulb syringe on the perineum and a loose nuchal cord was  reduced.  Apgars are 9 and 9.  Weight is pending, although it looks to be  around 6-1/2 to 7 pounds.  The placenta separated spontaneously and was  delivered via controlled cord traction at 0905.  Pitocin 20 units dilated in  1000 mL of lactated Ringers was then infused rapidly IV.  The fundus was  immediately firm and minimal blood loss was noted.  The vagina was then  inspected and no lacerations were found.  The epidural catheter was removed  with the balloon tip visualized as being intact.  Estimated blood loss 250  mL.     ________________________________________  ___________________________________________  Jacklyn Shell, C.N.M.           [name], M.D.   FC/MEDQ  D:  09/18/2003  T:  09/18/2003  Job:  [id number]   cc:   [facility]  [address]  [place], [place] [address]

## 2010-07-26 NOTE — Assessment & Plan Note (Signed)
[facility]                          [facility] OFFICE NOTE   DARENE, NAPPI                         MRN:          [id number]  DATE:02/18/2006                            DOB:          [birth year]    This is a 30 year old female, granddaughter of [employer].  Patient  has a history of palpitations.  She was seen approximately a year ago.  Appeared these palpitations were felt to be rather benign and further  cardiac workup was undertaken.  The patient says that over the last year  she has been doing well.  She continues to be under a significant amount  of social stress.  Unfortunately also continues to smoke.  She reports  two separate episodes of palpitations over the last two months.  They  were approximately half an hour in duration but were not associated with  diaphoresis, chest pain or shortness of breath.  There was no pre  syncope or syncope.  They did appear to occur at times of increased  stress.  The patient also has a history of heart murmur as a child.   As outlined above, the patient continues to smoke and she also has  problems now with a dental abscess for which she will require further  evaluation next week.  She has been on antibiotic therapy.  Medications,  amoxicillin and pain medications p.r.n.   PHYSICAL EXAMINATION:  VITAL SIGNS: Blood pressure 100/80, heart rate 60  beats per min.  NECK: Normal carotid upstrokes with no bruits.  LUNGS:  Clear and breath sounds are normal.  HEART:  Regular rate and rhythm with normal sinus rhythm. No murmurs,  rubs, or gallops.  ABDOMEN: Soft.  EXTREMITY EXAM: No cyanosis, clubbing or edema.  NEURO: Patient alert, oriented and grossly nonfocal.   PROBLEM:  1. Palpitations.  2. History of cardiac murmur.  3. Stress and anxiety.  4. Tobacco use.   PLAN:  1. The patients palpitations again, I suspect, are rather benign.  I      can not rule out SVT, but they are so infrequent  for her cardiac      monitor is not indicated now.  2. I will rule the patient out for structural heart disease with an      echocardiograph study and encourage her to stop smoking.    Learta Codding, MD,FACC  Electronically Signed   GED/MedQ  DD: 02/18/2006  DT: 02/18/2006  Job #: [id number]   cc:   Kingsley Callander. Ouida Sills, MD

## 2010-07-26 NOTE — H&P (Signed)
[name], [name] NO.:  [id number]   MEDICAL RECORD NO.:  [id number]                  PATIENT TYPE:   LOCATION:                                       FACILITY:   PHYSICIAN:  Langley Gauss, M.D.                DATE OF BIRTH:   DATE OF ADMISSION:  09/18/2003  DATE OF DISCHARGE:                                HISTORY & PHYSICAL   HISTORY OF PRESENT ILLNESS:  A 30 year old gravida 4, para 3, [redacted] weeks  gestation, prenatal care provided through [facility] who presents in  early stages of active labor.  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   PAST MEDICAL HISTORY:  1. Three prior vaginally resolved complications, all delivered up at     [facility].  2. History of anxiety and depression, specifically postpartum depression.     Thus, she was started on Lexapro 10 mg p.o. daily during this third     trimester.  3. No other medical or surgical history.   ALLERGIES:  No known drug allergies.   PHYSICAL EXAMINATION:  GENERAL APPEARANCE:  Pleasant white female.  VITAL SIGNS:  Respirations 20, temperature 97, blood pressure 106/61, height  5 feet 1 inches, weight 176.  HEENT:  Negative.  No adenopathy.  NECK:  Supple.  Thyroid is nonpalpable.  LUNGS:  Clear.  CARDIOVASCULAR:  Regular rate and rhythm.  ABDOMEN:  Soft and nontender.  No surgical scars are identified.  Fundal  height is 38 cm.  She has vertex presentation by Leopold's maneuvers.  EXTREMITIES:  Normal.   STUDIES:  External fetal monitor:  The patient is contracting q.2-4 minutes  of moderate intensity.  She is noted to have a reassuring fetal heart rate  with accelerations noted.  No fetal heart rate decelerations.   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  The  patient is receiving IV ampicillin in effort to prevent vertical  transmission to the infant, A positive blood type, urinalysis  negative,  hemoglobin 12.0, hematocrit 33.4, white count 11.0.  Urinalysis is negative.  The patient was noted to have cervix at 2-3 cm dilated and heavier than  expected bleeding per the nursing staff as well as some irregularity of the  cervix.  When I examined the patient, she was noted to be 6 cm dilated.  Amniotomy was performed with fetal scalp electrode, clear amniotic fluid and  no additional bleeding was noted at that time.  The patient does desire  placement of continuous lumbar epidural. This request will be complied with.    ___________________________________________                                         Langley Gauss, M.D.   DC/MEDQ  D:  09/18/2003  T:  09/18/2003  Job:  [id number]

## 2010-07-26 NOTE — Op Note (Signed)
[name], [name]                  ACCOUNT NO.:  [id number]   MEDICAL RECORD NO.:  [id number]          PATIENT TYPE:  OBV   LOCATION:  A417                          FACILITY:  [facility]   PHYSICIAN:  Lazaro Arms, M.D.   DATE OF BIRTH:  [birth year]   DATE OF PROCEDURE:  08/13/2005  DATE OF DISCHARGE:                                 OPERATIVE REPORT   PREOPERATIVE DIAGNOSES:  1.  Menometrorrhagia.  2.  Dysmenorrhea.  3.  Dyspareunia.  4.  Right lower quadrant pain.   POSTOPERATIVE DIAGNOSES:  1.  Menometrorrhagia.  2.  Dysmenorrhea.  3.  Dyspareunia.  4.  Right lower quadrant pain.   PROCEDURE:  Vaginal hysterectomy with right salpingo-oophorectomy.   SURGEON:  Dr. Despina Hidden   ANESTHESIA:  General endotracheal.   FINDINGS:  The patient had a very mushy uterus.  I would assume that she is  going to have extensive adenomyosis.  The right ovary and tube were normal.  The left ovary and tube were normal.  As per preoperative plan, though the  right was taken out.   DESCRIPTION OF OPERATION:  The patient was taken to the operating room,  placed in a supine position, where she underwent general endotracheal  anesthesia.  She was then placed in dorsal lithotomy position in candy-cane  stirrups.  She was prepped and draped in the usual sterile fashion.  Weighted speculum was placed.  The cervix was grasped.  Marcaine 0.5% with  1:200,000 epinephrine was injected into the cervix.  Electrocautery unit was  used.  An incision was made about the cervix.  The anterior and posterior  vagina were pushed off the lower uterine segment without difficulty.  The  anterior cul-de-sac was entered without difficulty.  The peritoneum was  identified, and I entered anteriorly.  The uterosacral ligaments were  clamped, cut, and suture ligated and held bilaterally.  The cardinal  ligaments were clamped, cut, suture ligated and held bilaterally.  The  anterior and posterior leafs of the broad ligament were  plicated.  The  posterior cul-de-sac was also entered.  The uterine vessels were clamped,  cut, and suture ligated.  Serial pedicles were taken up the cervix and up  the fundus, each pedicle being clamped, cut, and suture ligated.  The  uteroovarian ligaments were cross-clamped, double suture ligated, and the  uterus was removed.  The infundibulopelvic ligament on the right was then  cross-clamped, double suture ligated, and also large hemoclips were placed  above the sutures to prevent any slippage of the peritoneum.  There was a  small hematoma that formed in the right infundibulopelvic ligament but was  stable and watched for several minutes, but it did not expand, and  there was no bleeding.  The peritoneum was closed in a pursestring fashion.  The vagina was closed to posterior without difficulty in interrupted  fashion.  The patient tolerated the procedure well.  She was awakened from  anesthesia and taken to the recovery room in good, stable condition.  All  counts were correct x3.      Omelia Blackwater  Lauretta Chester, M.D.  Electronically Signed     LHE/MEDQ  D:  08/13/2005  T:  08/13/2005  Job:  [id number]

## 2010-07-26 NOTE — H&P (Signed)
[facility]  Patient:    [name], [name] Visit Number: [id number] MRN: [id number]          Service Type: MED Location: 3A A319 01 Attending Physician:  Carylon Perches Admit Date:  08/21/2001 Discharge Date: 08/21/2001                           History and Physical  CHIEF COMPLAINT:  Headache, inability to talk, weakness on the right hand, numbness on the right hand, and right-sided drooping of the mouth.  HISTORY OF PRESENT ILLNESS:  Patient is a 30 year old white female with history of questionable migraine headache who presents in the ER with sudden onset of inability to talk, weakness on the right hand, numbness of the right hand, drooling of the right side of the mouth all of which associated with right-sided headache.  At the time of taking this history, patient was still unable to express herself, it was the husband who gave the major part of the history.  About 20 minutes while patient was in triage, the above-mentioned complaints were still going on.  But, when patient came to the ER, most of the complaints have resolved including weakness, drooping of the mouth, and headache, but inability to express herself had continued.  Husband said patient has had the same complaints about eight months ago.  At that time, a CT scan of the head was done and said to be "negative."  But the report of the CT is not available at this time.  Patient denied fever and chills.  She denied chest pain, shortness of breath, palpitation, and leg swelling.  PAST MEDICAL HISTORY:  Essentially as stated above.  Patient denied a history of hypertension, hypercholesterolemia, seizure disorder, and a history of depressive illness.  SOCIAL HISTORY:  Patient smokes cigarettes about one pack a day, denied a history of cocaine abuse or any other drug abuse.  FAMILY HISTORY:  Positive for stroke in patients father, denied family history of migraine headache.  CURRENT  MEDICATIONS:  Husband said patient takes medicine for sinus but does not state what type of medicine.  ALLERGIES:  No known drug allergies.  REVIEW OF SYSTEMS:  Positive for headache.  No fever.  No nausea, no vomiting, no abdominal pain.  PHYSICAL EXAMINATION:  GENERAL:  A young white woman in no apparent distress.  Patient is alert and oriented x 3.  VITAL SIGNS:  Blood pressure is 130/70, pulse 99, respiratory rate 18, temperature 98.2.  HEENT:  Head is normocephalic, atraumatic.  Pupils are equal, round, reactive to light and accommodation.  Mucous membranes are moist, no oropharyngeal lesion.  NECK:  Supple without adenopathy, no jugular venous distention, no carotid bruit.  CARDIOVASCULAR:  Normal heart sounds and rate, no S3 gallop.  No murmur appreciated.  RESPIRATORY:  Lungs are clear to auscultation and percussion and there is good air entry bilaterally.  GI:  Abdomen is obese, no area of tenderness, no palpable mass, no organomegaly, bowel sounds are normal active.  EXTREMITIES:  No cyanosis, no digital clubbing, pulses palpable and normal.  NEUROLOGIC:  No focal neurological deficits.  No facial drooping.  Power 5/5 in all extremities.  Cranial nerves 2-12 are grossly intact.  DIAGNOSTIC INVESTIGATIONS:  CBC:  WBC 10.9, hemoglobin 15.6, hematocrit 43.5, platelets 326.  PT 12.7, INR 1.0, PTT 32.  CT of the head stated to be negative.  The exact report is not available to me at this time.  ASSESSMENT:  A young white woman with questionable history of migraine now presenting with right-sided headache associated with transient right-sided weakness and persistent expressive aphasia with negative CT of the head.  PLAN:  Patient will be admitted with the following working diagnosis: 1. Acute migraine headache, possibly cluster type of migraine headache. 2. History of migraine headache.  Patient will be admitted to the medical floor and will be closely  monitored. She will be started on Neurontin and on an appropriate analgesic as needed. Patient and her husband have been counseled regarding patients present medical condition.  She has also been counseled regarding tobacco cessation. Attending Physician:  Carylon Perches DD:  08/21/01 TD:  08/23/01 Job: [id number]

## 2010-07-26 NOTE — Op Note (Signed)
NAME:  [name], [name]                            ACCOUNT NO.:  [id number]   MEDICAL RECORD NO.:  [id number]                   PATIENT TYPE:  AMB   LOCATION:  DAY                                  FACILITY:  [facility]   PHYSICIAN:  [name], M.D.              DATE OF BIRTH:  Mar 29, 1980   DATE OF PROCEDURE:  DATE OF DISCHARGE:                                  PROCEDURE NOTE   PREOPERATIVE DIAGNOSIS:  Elective sterilization.   POSTOPERATIVE DIAGNOSIS:  Elective sterilization.   PROCEDURE:  Laparoscopic tubal sterilization, Falope ring.   SURGEON:  Dr. Emelda Fear.   ASSISTANT:  None.   ANESTHESIA:  General.   COMPLICATIONS:  None.   FINDINGS:  Normal anatomy.  Postpartum status.  Slightly enlarged uterus at  five weeks postpartum.  No evidence of uterine perforation or bowel injury.  Slight increased bleeding from the Hulka tenaculum.  Will monitor in  recovery.   DETAILS OF PROCEDURE:   PREOPERATIVE DIAGNOSIS:  Elective sterilization.   POSTOPERATIVE DIAGNOSIS:  Elective sterilization.   PROCEDURE:  Laparoscopic tubal sterilization with Falope rings.   SURGEON:  Christin Bach, M.D.   ASSISTANT:  @@   ANESTHESIA:  General with endotracheal intubation, Idacavage, CRNA.   COMPLICATIONS:  None.   ESTIMATED BLOOD LOSS:  Minimal.   FINDINGS:  Normal tubes, no evidence of adhesions, elongate round ligaments  with very lax pelvic support.   DETAILS OF PROCEDURE:  The patient was taken to the operating room, prepped  and draped in the usual standard fashion with legs in low lithotomy leg  supports after general anesthesia was introduced without difficulty.  The  bladder was in-and-out catheterized and Hulka tenaculum attached to the  cervix for uterine  manipulation.  An infraumbilical, vertical, 1-cm, skin  incision was made as well as a transverse suprapubic 1-cm incision. A Veress  needle was used to achieve pneumoperitoneum through the umbilical incision  while being  careful to orient the needle toward the pelvis while elevating  the abdominal wall by manual elevation. Water droplet test was used to  confirm intraperitoneal placement.   Pneumoperitoneum was achieved easily under 8-to-10 mm of intra-abdominal  pressure; and the laparoscopic trocar was introduced, a 5-mm blunt tipped  trocar, under direct visualization using the video camera.  Peritoneal  cavity was entered without difficulty.  Inspection of the anterior surfaces  of the abdominal contents showed no evidence of injury or bleeding.  Attention was directed to the pelvis.  Findings were as described above.   Attention was first directed to the left fallopian tube which was elevated,  identified to its fimbriated end and grasped in its midportion with Falope  ring applier.  Falope ring applied and then the tube infiltrated with  Marcaine solution 0.25% using a 22-gauge spinal needle percutaneously  applied.   Attention was then directed to the right fallopian tube where a similar  procedure was performed.  Photo documentation of the ring placements was  performed; 120 cc of saline was instilled into the abdomen; deflation of  CO2 performed; instruments removed and subcuticular 4-0 Dexon closure of  skin incisions performed.  The rest of the surgical instruments were  removed; Steri-Strips placed.  The patient allowed to awaken and go to  recovery room in standard fashion.      ___________________________________________                                            [name], M.D.   JVF/MEDQ  D:  10/23/2003  T:  10/23/2003  Job:  [id number]

## 2010-09-19 ENCOUNTER — Encounter: Payer: Self-pay | Admitting: *Deleted

## 2010-09-19 ENCOUNTER — Emergency Department (HOSPITAL_COMMUNITY)
Admission: EM | Admit: 2010-09-19 | Discharge: 2010-09-19 | Disposition: A | Discharge status: Home / Self Care | Payer: BC Managed Care – PPO | Attending: Emergency Medicine | Admitting: Emergency Medicine

## 2010-09-19 DIAGNOSIS — F17200 Nicotine dependence, unspecified, uncomplicated: Secondary | ICD-10-CM | POA: Insufficient documentation

## 2010-09-19 DIAGNOSIS — K029 Dental caries, unspecified: Secondary | ICD-10-CM | POA: Insufficient documentation

## 2010-09-19 DIAGNOSIS — E876 Hypokalemia: Secondary | ICD-10-CM | POA: Insufficient documentation

## 2010-09-19 DIAGNOSIS — F411 Generalized anxiety disorder: Secondary | ICD-10-CM | POA: Insufficient documentation

## 2010-09-19 DIAGNOSIS — F1110 Opioid abuse, uncomplicated: Secondary | ICD-10-CM | POA: Insufficient documentation

## 2010-09-19 HISTORY — DX: Opioid abuse, uncomplicated: F11.10

## 2010-09-19 HISTORY — DX: Hypokalemia: E87.6

## 2010-09-19 MED ORDER — PREDNISONE 10 MG PO TABS: 20.0000 mg | ORAL_TABLET | Freq: Every day | ORAL | Status: AC

## 2010-09-19 MED ORDER — AMOXICILLIN 500 MG PO CAPS: 500.0000 mg | ORAL_CAPSULE | Freq: Three times a day (TID) | ORAL | Status: AC

## 2010-09-19 MED ORDER — PENICILLIN V POTASSIUM 250 MG PO TABS
500.0000 mg | ORAL_TABLET | Freq: Once | ORAL | Status: AC
  Administered 2010-09-19: 500 mg via ORAL
  Filled 2010-09-19: qty 2

## 2010-09-19 MED ORDER — HYDROCODONE-ACETAMINOPHEN 5-325 MG PO TABS
1.0000 | ORAL_TABLET | Freq: Once | ORAL | Status: AC
  Administered 2010-09-19: 1 via ORAL
  Filled 2010-09-19: qty 1

## 2010-09-19 MED ORDER — PENICILLIN V POTASSIUM 250 MG PO TABS
ORAL_TABLET | ORAL | Status: AC
  Filled 2010-09-19: qty 1

## 2010-09-19 MED ORDER — HYDROCODONE-ACETAMINOPHEN 5-500 MG PO TABS: ORAL_TABLET | ORAL | Status: DC

## 2010-09-19 MED ORDER — PREDNISONE 20 MG PO TABS
20.0000 mg | ORAL_TABLET | Freq: Once | ORAL | Status: AC
  Administered 2010-09-19: 20 mg via ORAL
  Filled 2010-09-19: qty 1

## 2010-09-19 NOTE — ED Notes (Signed)
Pt called out. States that pain is worse and it is making her clammy. Pt sitting on side of bed. HOB raised and pt laid back in bed. Cool, wet cloth applied to forehead.

## 2010-09-19 NOTE — ED Provider Notes (Signed)
History     Chief Complaint  Patient presents with  . Dental Pain   Patient is a 30 y.o. female presenting with tooth pain. The history is provided by the patient.  Dental PainThe primary symptoms include mouth pain. Primary symptoms do not include shortness of breath or cough. The symptoms began more than 1 week ago. The symptoms are worsening. The symptoms occur frequently.  Additional symptoms include: dental sensitivity to temperature, gum swelling, gum tenderness, jaw pain and facial swelling. Additional symptoms do not include: nosebleeds.    Past Medical History  Diagnosis Date  . Hypokalemia   . Narcotic abuse     Past Surgical History  Procedure Date  . Tonsillectomy   . Abdominal hysterectomy   . Cholecystectomy     History reviewed. No pertinent family history.  History  Substance Use Topics  . Smoking status: Current Everyday Smoker  . Smokeless tobacco: Not on file  . Alcohol Use: No    OB History    Grav Para Term Preterm Abortions TAB SAB Ect Mult Living                  Review of Systems  Constitutional: Negative for activity change.       All ROS Neg except as noted in HPI  HENT: Positive for facial swelling, neck pain and dental problem. Negative for nosebleeds.   Eyes: Negative for photophobia and discharge.  Respiratory: Negative for cough, shortness of breath and wheezing.   Cardiovascular: Negative for chest pain and palpitations.  Gastrointestinal: Negative for abdominal pain and blood in stool.  Genitourinary: Negative for dysuria, frequency and hematuria.  Musculoskeletal: Negative for back pain and arthralgias.  Skin: Negative.   Neurological: Negative for dizziness, seizures and speech difficulty.  Psychiatric/Behavioral: Negative for hallucinations and confusion. The patient is nervous/anxious.     Physical Exam  BP 118/67  Pulse 96  Temp(Src) 98.7 F (37.1 C) (Oral)  Resp 18  Ht 5\' 1"  (1.549 m)  Wt 105 lb (47.628 kg)  BMI  19.84 kg/m2  SpO2 100%  Physical Exam  Nursing note and vitals reviewed. Constitutional: She is oriented to person, place, and time. She appears well-developed and well-nourished.  Non-toxic appearance.  HENT:  Head: Normocephalic.  Right Ear: Tympanic membrane and external ear normal.  Left Ear: Tympanic membrane and external ear normal.  Mouth/Throat: Oropharynx is clear and moist. Abnormal dentition. Dental caries present.  Eyes: EOM and lids are normal. Pupils are equal, round, and reactive to light.  Neck: Normal range of motion. Neck supple. Carotid bruit is not present.  Cardiovascular: Normal rate, regular rhythm, normal heart sounds, intact distal pulses and normal pulses.   Pulmonary/Chest: Breath sounds normal. No respiratory distress.  Abdominal: Soft. Bowel sounds are normal. There is no tenderness. There is no guarding.  Musculoskeletal: Normal range of motion.  Lymphadenopathy:       Head (right side): No submandibular adenopathy present.       Head (left side): No submandibular adenopathy present.    She has no cervical adenopathy.  Neurological: She is alert and oriented to person, place, and time. She has normal strength. No cranial nerve deficit or sensory deficit.  Skin: Skin is warm and dry.  Psychiatric: Her speech is normal. Her mood appears anxious.    ED Course  Procedures  MDM I have reviewed nursing notes, vital signs, and all appropriate lab and imaging results for this patient.      Kathie Dike,  PA 09/19/10 1404

## 2010-09-19 NOTE — ED Notes (Signed)
Sharp shooting pains from right tooth shooting into right ear x 5 days ago.  C/o headache.

## 2010-12-17 LAB — POCT HEMOGLOBIN-HEMACUE
Hemoglobin: 13.8
Operator id: 123881

## 2010-12-22 ENCOUNTER — Emergency Department (HOSPITAL_COMMUNITY)
Admission: EM | Admit: 2010-12-22 | Discharge: 2010-12-22 | Disposition: A | Discharge status: Home / Self Care | Payer: BC Managed Care – PPO | Attending: Emergency Medicine | Admitting: Emergency Medicine

## 2010-12-22 ENCOUNTER — Emergency Department (HOSPITAL_COMMUNITY): Payer: BC Managed Care – PPO

## 2010-12-22 ENCOUNTER — Encounter (HOSPITAL_COMMUNITY): Payer: Self-pay | Admitting: *Deleted

## 2010-12-22 DIAGNOSIS — IMO0002 Reserved for concepts with insufficient information to code with codable children: Secondary | ICD-10-CM | POA: Insufficient documentation

## 2010-12-22 DIAGNOSIS — Y92009 Unspecified place in unspecified non-institutional (private) residence as the place of occurrence of the external cause: Secondary | ICD-10-CM | POA: Insufficient documentation

## 2010-12-22 DIAGNOSIS — S0083XA Contusion of other part of head, initial encounter: Secondary | ICD-10-CM

## 2010-12-22 DIAGNOSIS — F17200 Nicotine dependence, unspecified, uncomplicated: Secondary | ICD-10-CM | POA: Insufficient documentation

## 2010-12-22 DIAGNOSIS — S0003XA Contusion of scalp, initial encounter: Secondary | ICD-10-CM | POA: Insufficient documentation

## 2010-12-22 MED ORDER — KETOROLAC TROMETHAMINE 60 MG/2ML IM SOLN
60.0000 mg | Freq: Once | INTRAMUSCULAR | Status: AC
  Administered 2010-12-22: 60 mg via INTRAMUSCULAR
  Filled 2010-12-22: qty 2

## 2010-12-22 MED ORDER — IBUPROFEN 600 MG PO TABS: 600.0000 mg | ORAL_TABLET | Freq: Four times a day (QID) | ORAL | Status: AC | PRN
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
## 2010-12-22 NOTE — ED Notes (Signed)
Pt very rude, mad because she wanted something else for pain
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
## 2010-12-22 NOTE — ED Notes (Signed)
Pt reporting pain in right side of jaw and in teeth.  States that no teeth were loosened.  Small abrasion noted on underside of jaw.  Pt reports that it does hurt to swallow.  No breathing difficulties noted.

## 2010-12-23 LAB — POCT HEMOGLOBIN-HEMACUE: Hemoglobin: 13

## 2010-12-24 NOTE — ED Provider Notes (Signed)
Medical screening examination/treatment/procedure(s) were performed by non-physician practitioner and as supervising physician I was immediately available for consultation/collaboration.  Brieonna Crutcher R. Lashan Gluth, MD 12/24/10 0720 

## 2011-01-27 ENCOUNTER — Emergency Department (HOSPITAL_COMMUNITY)
Admission: EM | Admit: 2011-01-27 | Discharge: 2011-01-27 | Disposition: A | Discharge status: Home / Self Care | Payer: BC Managed Care – PPO | Attending: Emergency Medicine | Admitting: Emergency Medicine

## 2011-01-27 ENCOUNTER — Encounter (HOSPITAL_COMMUNITY): Payer: Self-pay | Admitting: *Deleted

## 2011-01-27 ENCOUNTER — Emergency Department (HOSPITAL_COMMUNITY): Payer: BC Managed Care – PPO

## 2011-01-27 DIAGNOSIS — Z5320 Procedure and treatment not carried out because of patient's decision for unspecified reasons: Secondary | ICD-10-CM | POA: Insufficient documentation

## 2011-01-27 DIAGNOSIS — J069 Acute upper respiratory infection, unspecified: Secondary | ICD-10-CM | POA: Insufficient documentation

## 2011-01-27 MED ORDER — ACETAMINOPHEN 500 MG PO TABS: 1000.0000 mg | ORAL_TABLET | Freq: Once | ORAL | Status: DC

## 2011-01-27 NOTE — ED Notes (Signed)
Pt c/o aching all over, cough, ear ache and sore throat since Friday. Pt states that she has had a fever off and on with the highest 103. Pt also c/o nausea.

## 2011-01-27 NOTE — ED Provider Notes (Signed)
History    Scribed for Laray Anger, DO, the patient was seen in room APA09/APA09. This chart was scribed by Katha Cabal.   CSN: [id number] Arrival date & time: 01/27/2011  8:54 AM    Chief Complaint  Patient presents with  . Cough    HPI Pt was seen at 8:57 AM.  [name] is a 30 y.o. female who presents to the Emergency Department complaining of gradual onset and persistence of constant ears ache, sore throat, runny/stuffy nose, cough and generalized body aches/fatigue x3 days.  Has been assoc with home fever to "103" per pt.  +sick contacts in household with same symptoms.  Denies CP/SOB, no rash, no back pain, no vomiting/diarrhea.    Past Medical History  Diagnosis Date  . Hypokalemia   . Narcotic abuse     Past Surgical History  Procedure Date  . Tonsillectomy   . Abdominal hysterectomy   . Cholecystectomy   . Tubal ligation     Family History  Problem Relation Age of Onset  . Stroke Father     History  Substance Use Topics  . Smoking status: Current Everyday Smoker    Types: Cigarettes  . Smokeless tobacco: Not on file  . Alcohol Use: Yes     rarely    Allergies    Allergies  Allergen Reactions  . Depakote   . Toradol Hives     Home Medications    Current Outpatient Rx  Name Route Sig Dispense Refill  . ALPRAZOLAM 1 MG PO TABS Oral Take 1 mg by mouth at bedtime as needed.      Marland Kitchen ZOLPIDEM TARTRATE ER 12.5 MG PO TBCR Oral Take 12.5 mg by mouth at bedtime as needed.        Review of Systems ROS: Statement: All systems negative except as marked or noted in the HPI; Constitutional: Positive for fever and chills, generalized body aches/fatigue; ; Eyes: Negative for eye pain, redness and discharge. ; ; ENMT: Positive for ear pain, nasal congestion, sinus pressure and sore throat. ; ; Cardiovascular: Negative for chest pain, palpitations, diaphoresis, dyspnea and peripheral edema. ; ; Respiratory: +cough.  Negative for wheezing and stridor. ; ;  Gastrointestinal: Negative for vomiting, diarrhea and abdominal pain, blood in stool, hematemesis, jaundice and rectal bleeding. . ; ; Genitourinary: Negative for dysuria, flank pain and hematuria. ; ; Musculoskeletal: Negative for back pain and neck pain. Negative for swelling and trauma.; ; Skin: Negative for pruritus, rash, abrasions, blisters, bruising and skin lesion.; ; Neuro: Negative for headache, lightheadedness and neck stiffness. Negative for weakness, altered level of consciousness , altered mental status, extremity weakness, paresthesias, involuntary movement, seizure and syncope.     BP 99/51  Pulse 81  Temp(Src) 98.2 F (36.8 C) (Oral)  Resp 16  Ht 5\' 1"  (1.549 m)  Wt 115 lb (52.164 kg)  BMI 21.73 kg/m2  SpO2 100%  Physical Exam 0915: Physical examination:  Nursing notes reviewed; Vital signs and O2 SAT reviewed;  Constitutional: Well developed, Well nourished, Well hydrated, In no acute distress; Head:  Normocephalic, atraumatic; Eyes: EOMI, PERRL, No scleral icterus; ENMT: TM's clear bilat.  +edemetous nasal turbinates bilat with clear rhinorrhea. No drooling, no hoarse voice, no stridor.  Mouth and pharynx normal, Mucous membranes moist; Neck: Supple, Full range of motion, No lymphadenopathy, no meningeal signs; Cardiovascular: Regular rate and rhythm, No murmur, rub, or gallop; Respiratory: Breath sounds clear & equal bilaterally, No rales, rhonchi, wheezes, or rub, Normal respiratory  effort/excursion; Chest: Nontender, Movement normal; Abdomen: Soft, +suprapubic tenderness to palp, No palp hernias, no erythema or drainage around naval ring.  Nondistended, Normal bowel sounds; Genitourinary: No CVA tenderness; Extremities: Pulses normal, No tenderness, No edema, No calf edema or asymmetry.; Neuro: AA&Ox3, Major CN grossly intact.  No gross focal motor or sensory deficits in extremities.; Skin: Color normal, Warm, Dry, no rash, no petechiae.    ED Course  Procedures    MDM    MDM Reviewed: vitals, nursing note and previous chart     9:30 AM:  After my exam, pt asked me for "something for the pains all over."  I told her I would give her tylenol and motrin.  States she "can't take motrin."  I informed her that I will give her tylenol for what appears to be a URI/viral illness at this time.  After I left the room, pt apparently told ED RN that she could "just go home and take tylenol" and left the ED AMA.     [facility]  I personally performed the services described in this documentation, which was scribed in my presence. The recorded information has been reviewed and considered.    Laray Anger, DO 01/27/11 [phone]

## 2011-02-24 ENCOUNTER — Emergency Department (HOSPITAL_COMMUNITY)
Admission: EM | Admit: 2011-02-24 | Discharge: 2011-02-24 | Disposition: A | Discharge status: Home / Self Care | Payer: BC Managed Care – PPO | Attending: Emergency Medicine | Admitting: Emergency Medicine

## 2011-02-24 ENCOUNTER — Encounter (HOSPITAL_COMMUNITY): Payer: Self-pay

## 2011-02-24 DIAGNOSIS — F17200 Nicotine dependence, unspecified, uncomplicated: Secondary | ICD-10-CM | POA: Insufficient documentation

## 2011-02-24 DIAGNOSIS — K047 Periapical abscess without sinus: Secondary | ICD-10-CM | POA: Insufficient documentation

## 2011-02-24 DIAGNOSIS — F411 Generalized anxiety disorder: Secondary | ICD-10-CM | POA: Insufficient documentation

## 2011-02-24 DIAGNOSIS — K089 Disorder of teeth and supporting structures, unspecified: Secondary | ICD-10-CM | POA: Insufficient documentation

## 2011-02-24 DIAGNOSIS — S025XXA Fracture of tooth (traumatic), initial encounter for closed fracture: Secondary | ICD-10-CM | POA: Insufficient documentation

## 2011-02-24 DIAGNOSIS — IMO0002 Reserved for concepts with insufficient information to code with codable children: Secondary | ICD-10-CM | POA: Insufficient documentation

## 2011-02-24 HISTORY — DX: Anxiety disorder, unspecified: F41.9

## 2011-02-24 MED ORDER — BUPIVACAINE HCL (PF) 0.5 % IJ SOLN
INTRAMUSCULAR | Status: AC
  Filled 2011-02-24: qty 30

## 2011-02-24 MED ORDER — OXYCODONE-ACETAMINOPHEN 5-325 MG PO TABS: 2.0000 | ORAL_TABLET | ORAL | Status: AC | PRN

## 2011-02-24 NOTE — ED Provider Notes (Signed)
History     CSN: [id number] Arrival date & time: 02/24/2011  4:40 PM   First MD Initiated Contact with Patient 02/24/11 1643      Chief Complaint  Patient presents with  . Dental Pain    (Consider location/radiation/quality/duration/timing/severity/associated sxs/prior treatment) Patient is a 30 y.o. female presenting with tooth pain. The history is provided by the patient.  Dental PainThe primary symptoms include dental injury. Primary symptoms do not include headaches, fever, shortness of breath or sore throat. Primary symptoms comment: she was eating a cheeseburger today when her tooth broke in half.  She has exquisite nerve pain in the tooth and cannot be seen by her dentist until tomorrow. The symptoms began 2 to 6 hours ago. The symptoms are unchanged. The symptoms occur constantly.  Affected teeth include: 31/right lower second molar. Description of dental injury: The tooth on the medial aspect of her filling has broken off.  Additional symptoms include: dental sensitivity to temperature and jaw pain. Additional symptoms do not include: gum swelling, gum tenderness and ear pain.    Past Medical History  Diagnosis Date  . Hypokalemia   . Narcotic abuse   . Anxiety     Past Surgical History  Procedure Date  . Tonsillectomy   . Abdominal hysterectomy   . Cholecystectomy   . Tubal ligation   . Carpal tunnel release     Family History  Problem Relation Age of Onset  . Stroke Father     History  Substance Use Topics  . Smoking status: Current Everyday Smoker    Types: Cigarettes  . Smokeless tobacco: Not on file  . Alcohol Use: Yes     rarely    OB History    Grav Para Term Preterm Abortions TAB SAB Ect Mult Living                  Review of Systems  Constitutional: Negative for fever.  HENT: Positive for dental problem. Negative for ear pain, congestion, sore throat and neck pain.   Eyes: Negative.   Respiratory: Negative for chest tightness and  shortness of breath.   Cardiovascular: Negative for chest pain.  Gastrointestinal: Negative for nausea and abdominal pain.  Genitourinary: Negative.   Musculoskeletal: Negative for joint swelling and arthralgias.  Skin: Negative.  Negative for rash and wound.  Neurological: Negative for dizziness, weakness, light-headedness, numbness and headaches.  Hematological: Negative.   Psychiatric/Behavioral: Negative.     Allergies  Depakote; Ibuprofen; and Toradol  Home Medications   Current Outpatient Rx  Name Route Sig Dispense Refill  . ALPRAZOLAM 1 MG PO TABS Oral Take 1 mg by mouth at bedtime as needed.      Marland Kitchen ZOLPIDEM TARTRATE ER 12.5 MG PO TBCR Oral Take 12.5 mg by mouth at bedtime as needed.        BP 110/51  Pulse 67  Temp 98.3 F (36.8 C)  Resp 20  Ht 5\' 1"  (1.549 m)  Wt 110 lb (49.896 kg)  BMI 20.78 kg/m2  SpO2 100%  Physical Exam  Constitutional: She is oriented to person, place, and time. She appears well-developed and well-nourished. No distress.  HENT:  Head: Normocephalic and atraumatic.  Right Ear: Tympanic membrane and external ear normal.  Left Ear: Tympanic membrane and external ear normal.  Mouth/Throat: Oropharynx is clear and moist and mucous membranes are normal. No oral lesions. Dental abscesses present.    Eyes: Conjunctivae are normal.  Neck: Normal range of motion. Neck supple.  Cardiovascular: Normal rate and normal heart sounds.   Pulmonary/Chest: Effort normal.  Abdominal: She exhibits no distension.  Musculoskeletal: Normal range of motion.  Lymphadenopathy:    She has no cervical adenopathy.  Neurological: She is alert and oriented to person, place, and time.  Skin: Skin is warm and dry. No erythema.  Psychiatric: She has a normal mood and affect.    ED Course  Dental Date/Time: 02/24/2011 5:17 PM Performed by: Barry Culverhouse L Authorized by: Candis Musa Consent: Verbal consent obtained. Risks and benefits: risks, benefits and  alternatives were discussed Consent given by: patient Patient understanding: patient states understanding of the procedure being performed Time out: Immediately prior to procedure a "time out" was called to verify the correct patient, procedure, equipment, support staff and site/side marked as required. Comments: Right lower dental block performed using 3 cc of 0.5% bupivocaine,  Pt tolerated well   (including critical care time)  Labs Reviewed - No data to display No results found.   No diagnosis found.    MDM  Oxycodone for pain when block wears off.  Pt to see dentist tomorrow.  No sign of infection today.        Candis Musa, PA 02/24/11 1718

## 2011-02-24 NOTE — ED Notes (Signed)
Pt reports broke tooth while eating today

## 2011-02-26 NOTE — ED Provider Notes (Signed)
Medical screening examination/treatment/procedure(s) were performed by non-physician practitioner and as supervising physician I was immediately available for consultation/collaboration.   Shelda Jakes, MD 02/26/11 [phone]

## 2011-03-01 NOTE — ED Notes (Signed)
Pt's fall assessment completed. Incorrect date and time on triage doc.

## 2011-03-18 ENCOUNTER — Emergency Department (HOSPITAL_COMMUNITY): Payer: BC Managed Care – PPO

## 2011-03-18 ENCOUNTER — Emergency Department (HOSPITAL_COMMUNITY)
Admission: EM | Admit: 2011-03-18 | Discharge: 2011-03-18 | Disposition: A | Discharge status: Home / Self Care | Payer: BC Managed Care – PPO | Attending: Emergency Medicine | Admitting: Emergency Medicine

## 2011-03-18 ENCOUNTER — Encounter (HOSPITAL_COMMUNITY): Payer: Self-pay | Admitting: *Deleted

## 2011-03-18 DIAGNOSIS — F411 Generalized anxiety disorder: Secondary | ICD-10-CM | POA: Insufficient documentation

## 2011-03-18 DIAGNOSIS — R102 Pelvic and perineal pain: Secondary | ICD-10-CM

## 2011-03-18 DIAGNOSIS — Z9079 Acquired absence of other genital organ(s): Secondary | ICD-10-CM | POA: Insufficient documentation

## 2011-03-18 DIAGNOSIS — F17200 Nicotine dependence, unspecified, uncomplicated: Secondary | ICD-10-CM | POA: Insufficient documentation

## 2011-03-18 DIAGNOSIS — N949 Unspecified condition associated with female genital organs and menstrual cycle: Secondary | ICD-10-CM | POA: Insufficient documentation

## 2011-03-18 DIAGNOSIS — R1032 Left lower quadrant pain: Secondary | ICD-10-CM | POA: Insufficient documentation

## 2011-03-18 LAB — CBC
HCT: 38.2 % (ref 36.0–46.0)
Hemoglobin: 13 g/dL (ref 12.0–15.0)
RBC: 3.77 MIL/uL — ABNORMAL LOW (ref 3.87–5.11)
WBC: 11 10*3/uL — ABNORMAL HIGH (ref 4.0–10.5)

## 2011-03-18 LAB — COMPREHENSIVE METABOLIC PANEL
AST: 12 U/L (ref 0–37)
Alkaline Phosphatase: 85 U/L (ref 39–117)
BUN: 4 mg/dL — ABNORMAL LOW (ref 6–23)
CO2: 29 mEq/L (ref 19–32)
Chloride: 105 mEq/L (ref 96–112)
Creatinine, Ser: 0.55 mg/dL (ref 0.50–1.10)
GFR calc non Af Amer: >90 mL/min (ref >90)
Potassium: 3.3 mEq/L — ABNORMAL LOW (ref 3.5–5.1)
Total Bilirubin: 0.3 mg/dL (ref 0.3–1.2)

## 2011-03-18 LAB — URINALYSIS, ROUTINE W REFLEX MICROSCOPIC
Glucose, UA: NEGATIVE mg/dL
Hgb urine dipstick: NEGATIVE
Ketones, ur: NEGATIVE mg/dL
Protein, ur: NEGATIVE mg/dL
Urobilinogen, UA: 0.2 mg/dL (ref 0.0–1.0)

## 2011-03-18 LAB — DIFFERENTIAL
Lymphocytes Relative: 21 % (ref 12–46)
Lymphs Abs: 2.3 10*3/uL (ref 0.7–4.0)
Monocytes Absolute: 0.6 10*3/uL (ref 0.1–1.0)
Monocytes Relative: 5 % (ref 3–12)
Neutro Abs: 7.9 10*3/uL — ABNORMAL HIGH (ref 1.7–7.7)
Neutrophils Relative %: 72 % (ref 43–77)

## 2011-03-18 MED ORDER — OXYCODONE-ACETAMINOPHEN 5-325 MG PO TABS: 1.0000 | ORAL_TABLET | Freq: Four times a day (QID) | ORAL | Status: AC | PRN

## 2011-03-18 MED ORDER — ONDANSETRON HCL 4 MG/2ML IJ SOLN
4.0000 mg | Freq: Once | INTRAMUSCULAR | Status: AC
  Administered 2011-03-18: 4 mg via INTRAVENOUS
  Filled 2011-03-18: qty 2

## 2011-03-18 MED ORDER — HYDROMORPHONE HCL PF 1 MG/ML IJ SOLN
1.0000 mg | Freq: Once | INTRAMUSCULAR | Status: AC
  Administered 2011-03-18: 1 mg via INTRAVENOUS
  Filled 2011-03-18: qty 1

## 2011-03-18 MED ORDER — SODIUM CHLORIDE 0.9 % IV SOLN
Freq: Once | INTRAVENOUS | Status: AC
  Administered 2011-03-18: 18:00:00 via INTRAVENOUS

## 2011-03-18 NOTE — ED Notes (Signed)
Assisted dr. zammitt with pelvic. No specimens obtained

## 2011-03-18 NOTE — ED Provider Notes (Signed)
History     CSN: [id number]  Arrival date & time 03/18/11  1644   First MD Initiated Contact with Patient 03/18/11 1759      Chief Complaint  Patient presents with  . Abdominal Pain    (Consider location/radiation/quality/duration/timing/severity/associated sxs/prior treatment) Patient is a 31 y.o. female presenting with abdominal pain. The history is provided by the patient (the patient complains of left lower abdominal pain for 2 days now. Patient states that she only has one ovary in the left side does not have a uterus. She's had a lot of problems with ovarian pain and cysts before.).  Abdominal Pain The primary symptoms of the illness include abdominal pain. The primary symptoms of the illness do not include fever, fatigue, shortness of breath or diarrhea. The current episode started 2 days ago. The onset of the illness was sudden. The problem has not changed since onset. The abdominal pain began 2 days ago. The pain came on gradually. The abdominal pain has been unchanged since its onset. The abdominal pain is located in the LLQ. The abdominal pain does not radiate. The severity of the abdominal pain is 6/10. The abdominal pain is relieved by nothing.  The patient states that she believes she is currently not pregnant. The patient has not had a change in bowel habit. Symptoms associated with the illness do not include chills, hematuria, frequency or back pain. Significant associated medical issues do not include PUD.    Past Medical History  Diagnosis Date  . Hypokalemia   . Narcotic abuse   . Anxiety     Past Surgical History  Procedure Date  . Tonsillectomy   . Abdominal hysterectomy   . Cholecystectomy   . Tubal ligation   . Carpal tunnel release     Family History  Problem Relation Age of Onset  . Stroke Father     History  Substance Use Topics  . Smoking status: Current Everyday Smoker    Types: Cigarettes  . Smokeless tobacco: Not on file  . Alcohol Use: Yes      rarely    OB History    Grav Para Term Preterm Abortions TAB SAB Ect Mult Living                  Review of Systems  Constitutional: Negative for fever, chills and fatigue.  HENT: Negative for congestion, sinus pressure and ear discharge.   Eyes: Negative for discharge.  Respiratory: Negative for cough and shortness of breath.   Cardiovascular: Negative for chest pain.  Gastrointestinal: Positive for abdominal pain. Negative for diarrhea.  Genitourinary: Negative for frequency and hematuria.  Musculoskeletal: Negative for back pain.  Skin: Negative for rash.  Neurological: Negative for seizures and headaches.  Hematological: Negative.   Psychiatric/Behavioral: Negative for hallucinations.    Allergies  Depakote; Ibuprofen; and Toradol  Home Medications   Current Outpatient Rx  Name Route Sig Dispense Refill  . ALPRAZOLAM 2 MG PO TABS Oral Take 2 mg by mouth 4 (four) times daily as needed. For anxiety     . POTASSIUM PO Oral Take 1 tablet by mouth daily.      . TRAMADOL HCL 50 MG PO TABS Oral Take 50 mg by mouth every 6 (six) hours as needed. For knee and back pain. Maximum dose= 8 tablets per day     . ZOLPIDEM TARTRATE 10 MG PO TABS Oral Take 10 mg by mouth at bedtime as needed. For pain     . OXYCODONE-ACETAMINOPHEN  5-325 MG PO TABS Oral Take 1 tablet by mouth every 6 (six) hours as needed for pain. 20 tablet 0    BP 109/74  Pulse 73  Temp(Src) 99 F (37.2 C) (Oral)  Resp 20  Ht 5\' 1"  (1.549 m)  Wt 105 lb (47.628 kg)  BMI 19.84 kg/m2  SpO2 96%  Physical Exam  Constitutional: She is oriented to person, place, and time. She appears well-developed.  HENT:  Head: Normocephalic and atraumatic.  Eyes: Conjunctivae and EOM are normal. No scleral icterus.  Neck: Neck supple. No thyromegaly present.  Cardiovascular: Normal rate and regular rhythm.  Exam reveals no gallop and no friction rub.   No murmur heard. Pulmonary/Chest: No stridor. She has no wheezes. She  has no rales. She exhibits no tenderness.  Abdominal: She exhibits no distension. There is tenderness. There is no rebound.  Musculoskeletal: Normal range of motion. She exhibits no edema.  Lymphadenopathy:    She has no cervical adenopathy.  Neurological: She is oriented to person, place, and time. Coordination normal.  Skin: No rash noted. No erythema.  Psychiatric: She has a normal mood and affect. Her behavior is normal.    ED Course  Procedures (including critical care time)  Labs Reviewed  CBC - Abnormal; Notable for the following:    WBC 11.0 (*)    RBC 3.77 (*)    MCV 101.3 (*)    MCH 34.5 (*)    All other components within normal limits  DIFFERENTIAL - Abnormal; Notable for the following:    Neutro Abs 7.9 (*)    All other components within normal limits  COMPREHENSIVE METABOLIC PANEL - Abnormal; Notable for the following:    Potassium 3.3 (*)    BUN 4 (*)    All other components within normal limits  URINALYSIS, ROUTINE W REFLEX MICROSCOPIC   Ct Abdomen Pelvis Wo Contrast  03/18/2011  *RADIOLOGY REPORT*  Clinical Data: Left lower quadrant abdominal pain, low back pain, nausea  CT ABDOMEN AND PELVIS WITHOUT CONTRAST  Technique:  Multidetector CT imaging of the abdomen and pelvis was performed following the standard protocol without intravenous contrast.  Comparison: 09/11/2009  Findings: Lung bases clear.  Normal heart size.  No pericardial or pleural effusion.  Abdomen:  Previous cholecystectomy noted.  No biliary dilatation or obstruction.  Kidneys demonstrate no acute obstructive uropathy, hydronephrosis, or intrarenal calculi.  No hydroureter or ureteral calculus demonstrated.  Focal fatty infiltration of the liver along the falciform ligament, image 22 which is also stable.  No other hepatic abnormality by noncontrast imaging.  Biliary system, pancreas, spleen, and adrenal glands are within normal limits for noncontrast exam.  No bowel obstruction, dilatation, ileus, or  free air.  No abdominal free fluid, fluid collection, hemorrhage, adenopathy or abscess.  Pelvis: Right lower quadrant clips noted.  No abnormal appendix demonstrated.  Patient is also status post hysterectomy.  Pelvic calcifications are likely venous phleboliths.  Trace pelvic free fluid on the right, image 65.  Prominent left ovary with a discrete calcification noted, image 58.  No significant pelvic fluid collection, hemorrhage, abscess, adenopathy, inguinal abnormality, or hernia.  No acute or abnormal osseous finding.  IMPRESSION: Negative for acute obstructive uropathy, hydronephrosis, or obstructing urinary tract calculus.  Focal fatty infiltration of the liver anteriorly  Trace pelvic free fluid on the right, nonspecific.  No acute intra-abdominal pelvic process by noncontrast CT.  Original Report Authenticated By: Judie Petit. Ruel Favors, M.D.   US Transvaginal Non-ob  03/18/2011  *RADIOLOGY  REPORT*  Clinical Data:  Ovarian torsion.  Endometriosis.  Pelvic pain.  TRANSABDOMINAL AND TRANSVAGINAL ULTRASOUND OF PELVIS DOPPLER ULTRASOUND OF OVARIES  Technique:  Both transabdominal and transvaginal ultrasound examinations of the pelvis were performed. Transabdominal technique was performed for global imaging of the pelvis including uterus, ovaries, adnexal regions, and pelvic cul-de-sac.  It was necessary to proceed with endovaginal exam following the transabdominal exam to visualize the right adnexa.  Color and duplex Doppler ultrasound was utilized to evaluate blood flow to the ovaries.  Comparison:  None.  Findings:  Uterus:  Hysterectomy.  Right ovary: Not present.  Resected.  Left ovary: 40 mm x 21 mm x 24 mm.  The normal physiologic appearance.  Normal arterial and venous waveforms.  Multiple follicles.  Pulsed Doppler evaluation demonstrates normal low-resistance arterial and venous waveforms in the leftovaries.  Trace free fluid in the anatomic pelvis.  IMPRESSION: No evidence of ovarian torsion.  Hysterectomy  and resection of the right ovary. Normal physiologic appearance of the left ovary.  No sonographic evidence for ovarian torsion.  Original Report Authenticated By: Andreas Newport, M.D.   US Pelvis Complete  03/18/2011  *RADIOLOGY REPORT*  Clinical Data:  Ovarian torsion.  Endometriosis.  Pelvic pain.  TRANSABDOMINAL AND TRANSVAGINAL ULTRASOUND OF PELVIS DOPPLER ULTRASOUND OF OVARIES  Technique:  Both transabdominal and transvaginal ultrasound examinations of the pelvis were performed. Transabdominal technique was performed for global imaging of the pelvis including uterus, ovaries, adnexal regions, and pelvic cul-de-sac.  It was necessary to proceed with endovaginal exam following the transabdominal exam to visualize the right adnexa.  Color and duplex Doppler ultrasound was utilized to evaluate blood flow to the ovaries.  Comparison:  None.  Findings:  Uterus:  Hysterectomy.  Right ovary: Not present.  Resected.  Left ovary: 40 mm x 21 mm x 24 mm.  The normal physiologic appearance.  Normal arterial and venous waveforms.  Multiple follicles.  Pulsed Doppler evaluation demonstrates normal low-resistance arterial and venous waveforms in the leftovaries.  Trace free fluid in the anatomic pelvis.  IMPRESSION: No evidence of ovarian torsion.  Hysterectomy and resection of the right ovary. Normal physiologic appearance of the left ovary.  No sonographic evidence for ovarian torsion.  Original Report Authenticated By: Andreas Newport, M.D.   [place] Art/ven Flow Abd Pelv Doppler  03/18/2011  *RADIOLOGY REPORT*  Clinical Data:  Ovarian torsion.  Endometriosis.  Pelvic pain.  TRANSABDOMINAL AND TRANSVAGINAL ULTRASOUND OF PELVIS DOPPLER ULTRASOUND OF OVARIES  Technique:  Both transabdominal and transvaginal ultrasound examinations of the pelvis were performed. Transabdominal technique was performed for global imaging of the pelvis including uterus, ovaries, adnexal regions, and pelvic cul-de-sac.  It was necessary to proceed  with endovaginal exam following the transabdominal exam to visualize the right adnexa.  Color and duplex Doppler ultrasound was utilized to evaluate blood flow to the ovaries.  Comparison:  None.  Findings:  Uterus:  Hysterectomy.  Right ovary: Not present.  Resected.  Left ovary: 40 mm x 21 mm x 24 mm.  The normal physiologic appearance.  Normal arterial and venous waveforms.  Multiple follicles.  Pulsed Doppler evaluation demonstrates normal low-resistance arterial and venous waveforms in the leftovaries.  Trace free fluid in the anatomic pelvis.  IMPRESSION: No evidence of ovarian torsion.  Hysterectomy and resection of the right ovary. Normal physiologic appearance of the left ovary.  No sonographic evidence for ovarian torsion.  Original Report Authenticated By: Andreas Newport, M.D.     1. Pelvic pain in female  MDM          [name], MD 03/18/11 2151

## 2011-03-18 NOTE — ED Notes (Signed)
Assisted dr. zammit with pelvic.

## 2011-03-18 NOTE — ED Notes (Signed)
LLQ pain, with pressure sensation.  Alert,  Nausea,  Vag bleeding

## 2011-03-19 NOTE — ED Provider Notes (Signed)
History     CSN: [id number]  Arrival date & time 09/19/10  1159   None     Chief Complaint  Patient presents with  . Dental Pain    (Consider location/radiation/quality/duration/timing/severity/associated sxs/prior treatment) HPI  Past Medical History  Diagnosis Date  . Hypokalemia   . Narcotic abuse   . Anxiety     Past Surgical History  Procedure Date  . Tonsillectomy   . Abdominal hysterectomy   . Cholecystectomy   . Tubal ligation   . Carpal tunnel release     Family History  Problem Relation Age of Onset  . Stroke Father     History  Substance Use Topics  . Smoking status: Current Everyday Smoker    Types: Cigarettes  . Smokeless tobacco: Not on file  . Alcohol Use: Yes     rarely    OB History    Grav Para Term Preterm Abortions TAB SAB Ect Mult Living                  Review of Systems  Allergies  Depakote; Ibuprofen; and Toradol  Home Medications   Current Outpatient Rx  Name Route Sig Dispense Refill  . ALPRAZOLAM 2 MG PO TABS Oral Take 2 mg by mouth 4 (four) times daily as needed. For anxiety     . OXYCODONE-ACETAMINOPHEN 5-325 MG PO TABS Oral Take 1 tablet by mouth every 6 (six) hours as needed for pain. 20 tablet 0  . POTASSIUM PO Oral Take 1 tablet by mouth daily.      . TRAMADOL HCL 50 MG PO TABS Oral Take 50 mg by mouth every 6 (six) hours as needed. For knee and back pain. Maximum dose= 8 tablets per day     . ZOLPIDEM TARTRATE 10 MG PO TABS Oral Take 10 mg by mouth at bedtime as needed. For pain       BP 118/67  Pulse 96  Temp(Src) 98.7 F (37.1 C) (Oral)  Resp 18  Ht 5\' 1"  (1.549 m)  Wt 105 lb (47.628 kg)  BMI 19.84 kg/m2  SpO2 100%  Physical Exam  ED Course  Procedures (including critical care time)  Labs Reviewed - No data to display Ct Abdomen Pelvis Wo Contrast  03/18/2011  *RADIOLOGY REPORT*  Clinical Data: Left lower quadrant abdominal pain, low back pain, nausea  CT ABDOMEN AND PELVIS WITHOUT CONTRAST   Technique:  Multidetector CT imaging of the abdomen and pelvis was performed following the standard protocol without intravenous contrast.  Comparison: 09/11/2009  Findings: Lung bases clear.  Normal heart size.  No pericardial or pleural effusion.  Abdomen:  Previous cholecystectomy noted.  No biliary dilatation or obstruction.  Kidneys demonstrate no acute obstructive uropathy, hydronephrosis, or intrarenal calculi.  No hydroureter or ureteral calculus demonstrated.  Focal fatty infiltration of the liver along the falciform ligament, image 22 which is also stable.  No other hepatic abnormality by noncontrast imaging.  Biliary system, pancreas, spleen, and adrenal glands are within normal limits for noncontrast exam.  No bowel obstruction, dilatation, ileus, or free air.  No abdominal free fluid, fluid collection, hemorrhage, adenopathy or abscess.  Pelvis: Right lower quadrant clips noted.  No abnormal appendix demonstrated.  Patient is also status post hysterectomy.  Pelvic calcifications are likely venous phleboliths.  Trace pelvic free fluid on the right, image 65.  Prominent left ovary with a discrete calcification noted, image 58.  No significant pelvic fluid collection, hemorrhage, abscess, adenopathy, inguinal abnormality, or hernia.  No acute or abnormal osseous finding.  IMPRESSION: Negative for acute obstructive uropathy, hydronephrosis, or obstructing urinary tract calculus.  Focal fatty infiltration of the liver anteriorly  Trace pelvic free fluid on the right, nonspecific.  No acute intra-abdominal pelvic process by noncontrast CT.  Original Report Authenticated By: Judie Petit. Ruel Favors, M.D.   US Transvaginal Non-ob  03/18/2011  *RADIOLOGY REPORT*  Clinical Data:  Ovarian torsion.  Endometriosis.  Pelvic pain.  TRANSABDOMINAL AND TRANSVAGINAL ULTRASOUND OF PELVIS DOPPLER ULTRASOUND OF OVARIES  Technique:  Both transabdominal and transvaginal ultrasound examinations of the pelvis were performed.  Transabdominal technique was performed for global imaging of the pelvis including uterus, ovaries, adnexal regions, and pelvic cul-de-sac.  It was necessary to proceed with endovaginal exam following the transabdominal exam to visualize the right adnexa.  Color and duplex Doppler ultrasound was utilized to evaluate blood flow to the ovaries.  Comparison:  None.  Findings:  Uterus:  Hysterectomy.  Right ovary: Not present.  Resected.  Left ovary: 40 mm x 21 mm x 24 mm.  The normal physiologic appearance.  Normal arterial and venous waveforms.  Multiple follicles.  Pulsed Doppler evaluation demonstrates normal low-resistance arterial and venous waveforms in the leftovaries.  Trace free fluid in the anatomic pelvis.  IMPRESSION: No evidence of ovarian torsion.  Hysterectomy and resection of the right ovary. Normal physiologic appearance of the left ovary.  No sonographic evidence for ovarian torsion.  Original Report Authenticated By: Andreas Newport, M.D.   US Pelvis Complete  03/18/2011  *RADIOLOGY REPORT*  Clinical Data:  Ovarian torsion.  Endometriosis.  Pelvic pain.  TRANSABDOMINAL AND TRANSVAGINAL ULTRASOUND OF PELVIS DOPPLER ULTRASOUND OF OVARIES  Technique:  Both transabdominal and transvaginal ultrasound examinations of the pelvis were performed. Transabdominal technique was performed for global imaging of the pelvis including uterus, ovaries, adnexal regions, and pelvic cul-de-sac.  It was necessary to proceed with endovaginal exam following the transabdominal exam to visualize the right adnexa.  Color and duplex Doppler ultrasound was utilized to evaluate blood flow to the ovaries.  Comparison:  None.  Findings:  Uterus:  Hysterectomy.  Right ovary: Not present.  Resected.  Left ovary: 40 mm x 21 mm x 24 mm.  The normal physiologic appearance.  Normal arterial and venous waveforms.  Multiple follicles.  Pulsed Doppler evaluation demonstrates normal low-resistance arterial and venous waveforms in the  leftovaries.  Trace free fluid in the anatomic pelvis.  IMPRESSION: No evidence of ovarian torsion.  Hysterectomy and resection of the right ovary. Normal physiologic appearance of the left ovary.  No sonographic evidence for ovarian torsion.  Original Report Authenticated By: Andreas Newport, M.D.   [place] Art/ven Flow Abd Pelv Doppler  03/18/2011  *RADIOLOGY REPORT*  Clinical Data:  Ovarian torsion.  Endometriosis.  Pelvic pain.  TRANSABDOMINAL AND TRANSVAGINAL ULTRASOUND OF PELVIS DOPPLER ULTRASOUND OF OVARIES  Technique:  Both transabdominal and transvaginal ultrasound examinations of the pelvis were performed. Transabdominal technique was performed for global imaging of the pelvis including uterus, ovaries, adnexal regions, and pelvic cul-de-sac.  It was necessary to proceed with endovaginal exam following the transabdominal exam to visualize the right adnexa.  Color and duplex Doppler ultrasound was utilized to evaluate blood flow to the ovaries.  Comparison:  None.  Findings:  Uterus:  Hysterectomy.  Right ovary: Not present.  Resected.  Left ovary: 40 mm x 21 mm x 24 mm.  The normal physiologic appearance.  Normal arterial and venous waveforms.  Multiple follicles.  Pulsed Doppler evaluation demonstrates normal  low-resistance arterial and venous waveforms in the leftovaries.  Trace free fluid in the anatomic pelvis.  IMPRESSION: No evidence of ovarian torsion.  Hysterectomy and resection of the right ovary. Normal physiologic appearance of the left ovary.  No sonographic evidence for ovarian torsion.  Original Report Authenticated By: Andreas Newport, M.D.     1. Unspecified dental caries       MDM  Medical screening examination/treatment/procedure(s) were performed by non-physician practitioner and as supervising physician I was immediately available for consultation/collaboration.        Donnetta Hutching, MD 03/19/11 1501

## 2011-05-15 ENCOUNTER — Encounter (HOSPITAL_COMMUNITY): Payer: Self-pay | Admitting: *Deleted

## 2011-05-15 ENCOUNTER — Emergency Department (HOSPITAL_COMMUNITY)
Admission: EM | Admit: 2011-05-15 | Discharge: 2011-05-15 | Disposition: A | Discharge status: Home / Self Care | Payer: BC Managed Care – PPO | Attending: Emergency Medicine | Admitting: Emergency Medicine

## 2011-05-15 DIAGNOSIS — L0291 Cutaneous abscess, unspecified: Secondary | ICD-10-CM

## 2011-05-15 DIAGNOSIS — L02818 Cutaneous abscess of other sites: Secondary | ICD-10-CM | POA: Insufficient documentation

## 2011-05-15 DIAGNOSIS — R599 Enlarged lymph nodes, unspecified: Secondary | ICD-10-CM | POA: Insufficient documentation

## 2011-05-15 DIAGNOSIS — L03818 Cellulitis of other sites: Secondary | ICD-10-CM | POA: Insufficient documentation

## 2011-05-15 DIAGNOSIS — F17200 Nicotine dependence, unspecified, uncomplicated: Secondary | ICD-10-CM | POA: Insufficient documentation

## 2011-05-15 MED ORDER — OXYCODONE-ACETAMINOPHEN 5-325 MG PO TABS
1.0000 | ORAL_TABLET | Freq: Once | ORAL | Status: AC
  Administered 2011-05-15: 1 via ORAL
  Filled 2011-05-15: qty 1

## 2011-05-15 MED ORDER — DOXYCYCLINE HYCLATE 100 MG PO TABS
100.0000 mg | ORAL_TABLET | Freq: Once | ORAL | Status: AC
  Administered 2011-05-15: 100 mg via ORAL
  Filled 2011-05-15: qty 1

## 2011-05-15 MED ORDER — OXYCODONE-ACETAMINOPHEN 5-325 MG PO TABS: 1.0000 | ORAL_TABLET | ORAL | Status: AC | PRN

## 2011-05-15 MED ORDER — DOXYCYCLINE HYCLATE 100 MG PO CAPS: 100.0000 mg | ORAL_CAPSULE | Freq: Two times a day (BID) | ORAL | Status: AC

## 2011-05-15 NOTE — Discharge Instructions (Signed)
Abscess An abscess (boil or furuncle) is an infected area under your skin. This area is filled with yellowish white fluid (pus). HOME CARE   Only take medicine as told by your doctor.   Keep the skin clean around your abscess. Keep clothes that may touch the abscess clean.   Change any bandages (dressings) as told by your doctor.   Avoid direct skin contact with other people. The infection can spread by skin contact with others.   Practice good hygiene and do not share personal care items.   Do not share athletic equipment, towels, or whirlpools. Shower after every practice or work out session.   If a draining area cannot be covered:   Do not play sports.   Children should not go to daycare until the wound has healed or until fluid (drainage) stops coming out of the wound.   See your doctor for a follow-up visit as told.  GET HELP RIGHT AWAY IF:   There is more pain, puffiness (swelling), and redness in the wound site.   There is fluid or bleeding from the wound site.   You have muscle aches, chills, fever, or feel sick.   You or your child has a temperature by mouth above 102 F (38.9 C), not controlled by medicine.   Your baby is older than 3 months with a rectal temperature of 102 F (38.9 C) or higher.  MAKE SURE YOU:   Understand these instructions.   Will watch your condition.   Will get help right away if you are not doing well or get worse.  Document Released: 08/13/2007 Document Revised: 02/13/2011 Document Reviewed: 08/13/2007 [employer] Patient Information 2012 [employer]. 

## 2011-05-15 NOTE — ED Notes (Signed)
Pt states boil to back of neck first noticed yesterday. NAD

## 2011-05-15 NOTE — ED Provider Notes (Signed)
History     CSN: [id number]  Arrival date & time 05/15/11  4098   First MD Initiated Contact with Patient 05/15/11 [phone]      Chief Complaint  Patient presents with  . Abscess    (Consider location/radiation/quality/duration/timing/severity/associated sxs/prior treatment) Patient is a 31 y.o. female presenting with abscess. The history is provided by the patient. No language interpreter was used.  Abscess  This is a new problem. The current episode started yesterday. The onset was gradual. The problem occurs continuously. The problem has been unchanged. The abscess is present on the scalp. The problem is moderate. The abscess is characterized by painfulness and redness. It is unknown what she was exposed to. The abscess first occurred at home. Pertinent negatives include no fever, no diarrhea, no congestion, no rhinorrhea, no sore throat and no cough. Her past medical history is significant for skin abscesses in family. There were no sick contacts. She has received no recent medical care.    Past Medical History  Diagnosis Date  . Hypokalemia   . Narcotic abuse   . Anxiety     Past Surgical History  Procedure Date  . Tonsillectomy   . Abdominal hysterectomy   . Cholecystectomy   . Tubal ligation   . Carpal tunnel release     Family History  Problem Relation Age of Onset  . Stroke Father     History  Substance Use Topics  . Smoking status: Current Everyday Smoker    Types: Cigarettes  . Smokeless tobacco: Not on file  . Alcohol Use: Yes     rarely    OB History    Grav Para Term Preterm Abortions TAB SAB Ect Mult Living                  Review of Systems  Constitutional: Negative for fever and appetite change.  HENT: Negative for congestion, sore throat, facial swelling, rhinorrhea, neck pain and neck stiffness.   Respiratory: Negative for cough.   Gastrointestinal: Negative for diarrhea.  Musculoskeletal: Negative.   Skin:       abscess  Neurological:  Negative for dizziness and headaches.  Hematological: Positive for adenopathy.  All other systems reviewed and are negative.    Allergies  Depakote; Ibuprofen; and Toradol  Home Medications   Current Outpatient Rx  Name Route Sig Dispense Refill  . ALPRAZOLAM 2 MG PO TABS Oral Take 2 mg by mouth 4 (four) times daily as needed. For anxiety     . POTASSIUM PO Oral Take 1 tablet by mouth daily.      . TRAMADOL HCL 50 MG PO TABS Oral Take 50 mg by mouth every 6 (six) hours as needed. For knee and back pain. Maximum dose= 8 tablets per day     . ZOLPIDEM TARTRATE 10 MG PO TABS Oral Take 10 mg by mouth at bedtime as needed. For pain       BP 93/77  Pulse 61  Temp(Src) 97.9 F (36.6 C) (Oral)  Resp 16  Ht 5\' 1"  (1.549 m)  Wt 105 lb (47.628 kg)  BMI 19.84 kg/m2  SpO2 94%  Physical Exam  Nursing note and vitals reviewed. Constitutional: She appears well-developed and well-nourished. No distress.  HENT:  Head: Normocephalic.  Right Ear: External ear normal.  Left Ear: External ear normal.  Mouth/Throat: Oropharynx is clear and moist.  Neck: Normal range of motion. Neck supple.  Cardiovascular: Normal rate, regular rhythm and normal heart sounds.   No murmur  heard. Pulmonary/Chest: Effort normal and breath sounds normal.  Lymphadenopathy:    She has cervical adenopathy.  Neurological: She exhibits normal muscle tone. Coordination normal.  Skin:       Small, erythematous papule to the posterior scalp.  Center appears crusted.  Likely early abscess, no drainage at present    ED Course  Procedures (including critical care time)       MDM     Early developing abscess to the posterior scalp.  Small scabbed area also present. Mild surrounding erythema without drainage.   Slightly enlarged posterior cervical lymph nodes. Patient is nontoxic appearing. Vitals are stable   I will prescribe doxycycline in a short course of pain medication she agrees to frequent warm  compresses and close followup with her primary care physician or I have also advised her to return here if her symptoms worsen   Bryston Colocho L. [place], [place] 05/17/11 2207

## 2011-05-16 ENCOUNTER — Encounter (HOSPITAL_COMMUNITY): Payer: Self-pay

## 2011-05-16 ENCOUNTER — Emergency Department (HOSPITAL_COMMUNITY)
Admission: EM | Admit: 2011-05-16 | Discharge: 2011-05-16 | Disposition: A | Discharge status: Home / Self Care | Payer: BC Managed Care – PPO | Attending: Emergency Medicine | Admitting: Emergency Medicine

## 2011-05-16 DIAGNOSIS — L02811 Cutaneous abscess of head [any part, except face]: Secondary | ICD-10-CM

## 2011-05-16 DIAGNOSIS — L02818 Cutaneous abscess of other sites: Secondary | ICD-10-CM | POA: Insufficient documentation

## 2011-05-16 DIAGNOSIS — Z9089 Acquired absence of other organs: Secondary | ICD-10-CM | POA: Insufficient documentation

## 2011-05-16 DIAGNOSIS — F411 Generalized anxiety disorder: Secondary | ICD-10-CM | POA: Insufficient documentation

## 2011-05-16 DIAGNOSIS — F17200 Nicotine dependence, unspecified, uncomplicated: Secondary | ICD-10-CM | POA: Insufficient documentation

## 2011-05-16 MED ORDER — OXYCODONE-ACETAMINOPHEN 5-325 MG PO TABS
ORAL_TABLET | ORAL | Status: AC
  Filled 2011-05-16: qty 2

## 2011-05-16 MED ORDER — OXYCODONE-ACETAMINOPHEN 7.5-325 MG PO TABS: ORAL_TABLET | ORAL | Status: DC

## 2011-05-16 MED ORDER — OXYCODONE-ACETAMINOPHEN 5-325 MG PO TABS
2.0000 | ORAL_TABLET | Freq: Once | ORAL | Status: AC
  Administered 2011-05-16: 2 via ORAL

## 2011-05-16 NOTE — Discharge Instructions (Signed)
Abscess An abscess (boil or furuncle) is an infected area that contains a collection of pus.  SYMPTOMS Signs and symptoms of an abscess include pain, tenderness, redness, or hardness. You may feel a moveable soft area under your skin. An abscess can occur anywhere in the body.  TREATMENT  A surgical cut (incision) may be made over your abscess to drain the pus. Gauze may be packed into the space or a drain may be looped through the abscess cavity (pocket). This provides a drain that will allow the cavity to heal from the inside outwards. The abscess may be painful for a few days, but should feel much better if it was drained.  Your abscess, if seen early, may not have localized and may not have been drained. If not, another appointment may be required if it does not get better on its own or with medications. HOME CARE INSTRUCTIONS   Only take over-the-counter or prescription medicines for pain, discomfort, or fever as directed by your caregiver.   Take your antibiotics as directed if they were prescribed. Finish them even if you start to feel better.   Keep the skin and clothes clean around your abscess.   If the abscess was drained, you will need to use gauze dressing to collect any draining pus. Dressings will typically need to be changed 3 or more times a day.  The infection may spread by skin contact with others. Avoid skin contact as much as possible. Heat Therapy Your caregiver advises heat therapy for your condition. Heat applications help reduce pain and muscle spasm around injuries or areas of inflammation. They also increase blood flow to the area which can speed healing. Moist heat is commonly used to help heal skin infections. Heat treatments should be used for about 30-40 minutes every 2-4 hours. Shorter treatments should be used if there is discomfort. Different forms of heat therapy are: Warm water - Use a basin or tub filled with heated water; change it often to keep the water hot.  The water temperature should not be uncomfortable to the skin.  Hot packs - Use several bath towels soaked in hot water and lightly wrung out. These should be changed every 5-10 minutes. You can buy commercially-available packs that provide more sustained heat. Hot water bottles are not recommended because they give only a small amount of heat.  Electric heating pads - These may be used for dry heat only. Do not use wet material around a regular heating pad because of the risk of electrical shock. Do not leave heating pads on for long periods as they can burn the skin or cause permanent discoloration. Do not lie on top of a heating pad because, again, this can cause a burn.  Heat lamps - Use an infrared light. Keep the bulb 15-25 inches from the skin. Watch for signs of excessive heat (blotchy areas will appear).  Be cautious with heat therapy to avoid burning the skin. You should not use heat therapy without careful medical supervision if you have: circulation problems, numbness or unusual swelling in the area to be treated. Document Released: 02/24/2005 Document Revised: 02/13/2011 Document Reviewed: 08/22/2006  [facility] Patient Information 2012 [place], [place].  Practice good hygiene. This includes regular hand washing, cover any draining skin lesions, and do not share personal care items.   If you participate in sports, do not share athletic equipment, towels, whirlpools, or personal care items. Shower after every practice or tournament.   If a draining area cannot be  adequately covered:   Do not participate in sports.   Children should not participate in day care until the wound has healed or drainage stops.   If your caregiver has given you a follow-up appointment, it is very important to keep that appointment. Not keeping the appointment could result in a much worse infection, chronic or permanent injury, pain, and disability. If there is any problem keeping the appointment, you must call  back to this facility for assistance.  SEEK MEDICAL CARE IF:   You develop increased pain, swelling, redness, drainage, or bleeding in the wound site.   You develop signs of generalized infection including muscle aches, chills, fever, or a general ill feeling.   You have an oral temperature above 102 F (38.9 C).  MAKE SURE YOU:   Understand these instructions.   Will watch your condition.   Will get help right away if you are not doing well or get worse.  Document Released: 12/04/2004 Document Revised: 02/13/2011 Document Reviewed: 09/28/2007 [facility] Patient Information 2012 [place], [place].    Continue the antibiotic as directed.  Of the percocet 5/325 tabs you still have, take an additional 1/2 tablet with each dose prescribed.  Apply warm compresses 10-15 min several times daily.  If the area becomes soft in the center, return to the ED  And we will perform an incision and drainage procedure on it.

## 2011-05-16 NOTE — ED Provider Notes (Signed)
History     CSN: [id number]  Arrival date & time 05/16/11  1552   First MD Initiated Contact with Patient 05/16/11 1825      Chief Complaint  Patient presents with  . Abscess    (Consider location/radiation/quality/duration/timing/severity/associated sxs/prior treatment) HPI Comments: Pt was here yest for evaluation of a swollen, tender area on the scalp behind her R ear.  She is taking doxycycline 100 mg BID and percocet 5/325.  States no pain relief.  Patient is a 31 y.o. female presenting with abscess. The history is provided by the patient. No language interpreter was used.  Abscess  This is a new problem. The problem has been gradually worsening. The abscess is present on the scalp. The problem is severe. The abscess is characterized by swelling. The abscess first occurred at home. Pertinent negatives include no fever. There were no sick contacts. She has received no recent medical care.    Past Medical History  Diagnosis Date  . Hypokalemia   . Narcotic abuse   . Anxiety     Past Surgical History  Procedure Date  . Tonsillectomy   . Abdominal hysterectomy   . Cholecystectomy   . Tubal ligation   . Carpal tunnel release     Family History  Problem Relation Age of Onset  . Stroke Father     History  Substance Use Topics  . Smoking status: Current Everyday Smoker    Types: Cigarettes  . Smokeless tobacco: Not on file  . Alcohol Use: Yes     rarely    OB History    Grav Para Term Preterm Abortions TAB SAB Ect Mult Living                  Review of Systems  Constitutional: Negative for fever.  Skin:       abscess  All other systems reviewed and are negative.    Allergies  Depakote; Ibuprofen; and Toradol  Home Medications   Current Outpatient Rx  Name Route Sig Dispense Refill  . ALPRAZOLAM 2 MG PO TABS Oral Take 2 mg by mouth 4 (four) times daily as needed. For anxiety     . DOXYCYCLINE HYCLATE 100 MG PO CAPS Oral Take 1 capsule (100 mg  total) by mouth 2 (two) times daily. For 10 days 20 capsule 0  . OXYCODONE-ACETAMINOPHEN 5-325 MG PO TABS Oral Take 1 tablet by mouth every 4 (four) hours as needed for pain. 16 tablet 0  . TRAMADOL HCL 50 MG PO TABS Oral Take 50 mg by mouth every 6 (six) hours as needed. For knee and back pain. Maximum dose= 8 tablets per day     . ZOLPIDEM TARTRATE 10 MG PO TABS Oral Take 10 mg by mouth at bedtime as needed. For pain       BP 109/60  Pulse 72  Temp(Src) 98.1 F (36.7 C) (Oral)  Resp 18  Ht 5\' 1"  (1.549 m)  Wt 105 lb (47.628 kg)  BMI 19.84 kg/m2  SpO2 97%  Physical Exam  Nursing note and vitals reviewed. Constitutional: She is oriented to person, place, and time. She appears well-developed and well-nourished. No distress.  HENT:  Head: Normocephalic and atraumatic.    Eyes: EOM are normal.  Neck: Normal range of motion.  Cardiovascular: Normal rate, regular rhythm and normal heart sounds.   Pulmonary/Chest: Effort normal and breath sounds normal.  Abdominal: Soft. She exhibits no distension. There is no tenderness.  Musculoskeletal: Normal range of motion.  Neurological: She is alert and oriented to person, place, and time.  Skin: Skin is warm and dry.  Psychiatric: She has a normal mood and affect. Judgment normal.    ED Course  Procedures (including critical care time)  Labs Reviewed - No data to display No results found.   No diagnosis found.    MDM  Continue the doxy.  rx percocet 7.5/325.  Of the percocet you still have; take 1.5 tabs every 4-6 hrs.  Apply warm compresses 10-15 min several times daily to the lesion.  If if becomes soft in the center, return to the ED and will do an incision and drainage procedure.      Worthy Rancher, PA 05/16/11 [phone]

## 2011-05-16 NOTE — ED Provider Notes (Signed)
Medical screening examination/treatment/procedure(s) were performed by non-physician practitioner and as supervising physician I was immediately available for consultation/collaboration.  Doug Sou, MD 05/16/11 2340

## 2011-05-16 NOTE — ED Notes (Signed)
Seen here yesterday for abscess to post scalp.  Says she is having a lot of pain and does not feel better.

## 2011-05-16 NOTE — ED Notes (Signed)
Pt presents with abscess to back of head. Pt states she was here yesterday but the pain medication she was given isn't working at all.

## 2011-05-18 NOTE — ED Provider Notes (Signed)
Medical screening examination/treatment/procedure(s) were performed by non-physician practitioner and as supervising physician I was immediately available for consultation/collaboration.   Shelda Jakes, MD 05/18/11 2031

## 2011-08-07 ENCOUNTER — Emergency Department (HOSPITAL_COMMUNITY)
Admission: EM | Admit: 2011-08-07 | Discharge: 2011-08-07 | Disposition: A | Discharge status: Home / Self Care | Payer: BC Managed Care – PPO | Attending: Physician Assistant | Admitting: Physician Assistant

## 2011-08-07 ENCOUNTER — Encounter (HOSPITAL_COMMUNITY): Payer: Self-pay | Admitting: *Deleted

## 2011-08-07 DIAGNOSIS — K0889 Other specified disorders of teeth and supporting structures: Secondary | ICD-10-CM

## 2011-08-07 DIAGNOSIS — K089 Disorder of teeth and supporting structures, unspecified: Secondary | ICD-10-CM | POA: Insufficient documentation

## 2011-08-07 MED ORDER — AMOXICILLIN 500 MG PO CAPS: ORAL_CAPSULE | ORAL | Status: DC

## 2011-08-07 MED ORDER — BUTALBITAL-APAP-CAFF-COD 50-325-40-30 MG PO CAPS: ORAL_CAPSULE | ORAL | Status: DC

## 2011-08-07 NOTE — ED Notes (Signed)
Pt c/o left lower toothache since last night. States that a piece of her tooth broke off. Taking tylenol but is not helping the pain.

## 2011-08-07 NOTE — ED Provider Notes (Signed)
History     CSN: [id number]  Arrival date & time 08/07/11  0944   None     Chief Complaint  Patient presents with  . Dental Pain    (Consider location/radiation/quality/duration/timing/severity/associated sxs/prior treatment) Patient is a 31 y.o. female presenting with tooth pain. The history is provided by the patient.  Dental PainThe primary symptoms include mouth pain. Primary symptoms do not include oral bleeding, fever, shortness of breath or cough. The symptoms began yesterday. The symptoms are worsening. The symptoms occur constantly.  Additional symptoms include: gum tenderness and jaw pain. Additional symptoms do not include: facial swelling and nosebleeds. Medical issues include: smoking.    Past Medical History  Diagnosis Date  . Hypokalemia   . Narcotic abuse   . Anxiety     Past Surgical History  Procedure Date  . Tonsillectomy   . Abdominal hysterectomy   . Cholecystectomy   . Tubal ligation   . Carpal tunnel release     Family History  Problem Relation Age of Onset  . Stroke Father     History  Substance Use Topics  . Smoking status: Current Everyday Smoker    Types: Cigarettes  . Smokeless tobacco: Not on file  . Alcohol Use: Yes     rarely    OB History    Grav Para Term Preterm Abortions TAB SAB Ect Mult Living                  Review of Systems  Constitutional: Negative for fever and activity change.       All ROS Neg except as noted in HPI  HENT: Positive for dental problem. Negative for nosebleeds, facial swelling and neck pain.   Eyes: Negative for photophobia and discharge.  Respiratory: Negative for cough, shortness of breath and wheezing.   Cardiovascular: Negative for chest pain and palpitations.  Gastrointestinal: Negative for abdominal pain and blood in stool.  Genitourinary: Negative for dysuria, frequency and hematuria.  Musculoskeletal: Negative for back pain and arthralgias.  Skin: Negative.   Neurological: Negative  for dizziness, seizures and speech difficulty.  Psychiatric/Behavioral: Negative for hallucinations and confusion.    Allergies  Divalproex sodium; Ibuprofen; and Ketorolac tromethamine  Home Medications   Current Outpatient Rx  Name Route Sig Dispense Refill  . ALPRAZOLAM 2 MG PO TABS Oral Take 2 mg by mouth 4 (four) times daily as needed. For anxiety     . OXYCODONE-ACETAMINOPHEN 7.5-325 MG PO TABS  One po q 4-6 hrs prn pain 20 tablet 0  . TRAMADOL HCL 50 MG PO TABS Oral Take 50 mg by mouth every 6 (six) hours as needed. For knee and back pain. Maximum dose= 8 tablets per day     . ZOLPIDEM TARTRATE 10 MG PO TABS Oral Take 10 mg by mouth at bedtime as needed. For pain       BP 111/52  Pulse 88  Temp(Src) 98.1 F (36.7 C) (Oral)  Resp 16  Ht 5\' 1"  (1.549 m)  Wt 105 lb (47.628 kg)  BMI 19.84 kg/m2  SpO2 99%  Physical Exam  Nursing note and vitals reviewed. Constitutional: She is oriented to person, place, and time. She appears well-developed and well-nourished.  Non-toxic appearance.  HENT:  Head: Normocephalic.  Right Ear: Tympanic membrane and external ear normal.  Left Ear: Tympanic membrane and external ear normal.       Multiple dental caries. Mild swelling of the left lower gum. No abscess noted. Airway patent.  Eyes:  EOM and lids are normal. Pupils are equal, round, and reactive to light.  Neck: Normal range of motion. Neck supple. Carotid bruit is not present.  Cardiovascular: Normal rate, regular rhythm, normal heart sounds, intact distal pulses and normal pulses.   No murmur heard. Pulmonary/Chest: Breath sounds normal. No respiratory distress.  Abdominal: Soft. Bowel sounds are normal. There is no tenderness. There is no guarding.  Musculoskeletal: Normal range of motion.  Lymphadenopathy:       Head (right side): No submandibular adenopathy present.       Head (left side): No submandibular adenopathy present.    She has no cervical adenopathy.  Neurological:  She is alert and oriented to person, place, and time. She has normal strength. No cranial nerve deficit or sensory deficit.  Skin: Skin is warm and dry.  Psychiatric: She has a normal mood and affect. Her speech is normal.    ED Course  Procedures (including critical care time)  Labs Reviewed - No data to display No results found.   No diagnosis found.    MDM  I have reviewed nursing notes, vital signs, and all appropriate lab and imaging results for this patient. Pt has hx of dental caries. Tooth chip last night. Having increasing pain. Can't see her dentist until next week.  Rx for Amoxicillin and fioricet#3 given to the patient.       Kathie Dike, [place] 08/20/11 2021

## 2011-08-07 NOTE — Discharge Instructions (Signed)
Dental Pain  A tooth ache may be caused by cavities (tooth decay). Cavities expose the nerve of the tooth to air and hot or cold temperatures. It may come from an infection or abscess (also called a boil or furuncle) around your tooth. It is also often caused by dental caries (tooth decay). This causes the pain you are having.  DIAGNOSIS   Your caregiver can diagnose this problem by exam.  TREATMENT   · If caused by an infection, it may be treated with medications which kill germs (antibiotics) and pain medications as prescribed by your caregiver. Take medications as directed.  · Only take over-the-counter or prescription medicines for pain, discomfort, or fever as directed by your caregiver.  · Whether the tooth ache today is caused by infection or dental disease, you should see your dentist as soon as possible for further care.  SEEK MEDICAL CARE IF:  The exam and treatment you received today has been provided on an emergency basis only. This is not a substitute for complete medical or dental care. If your problem worsens or new problems (symptoms) appear, and you are unable to meet with your dentist, call or return to this location.  SEEK IMMEDIATE MEDICAL CARE IF:   · You have a fever.  · You develop redness and swelling of your face, jaw, or neck.  · You are unable to open your mouth.  · You have severe pain uncontrolled by pain medicine.  MAKE SURE YOU:   · Understand these instructions.  · Will watch your condition.  · Will get help right away if you are not doing well or get worse.  Document Released: 02/24/2005 Document Revised: 02/13/2011 Document Reviewed: 10/13/2007  [employer]® Patient Information ©2012 [employer].

## 2011-08-21 NOTE — ED Provider Notes (Signed)
Medical screening examination/treatment/procedure(s) were performed by non-physician practitioner and as supervising physician I was immediately available for consultation/collaboration.  Juliet Rude. Rubin Payor, MD 08/21/11 1610

## 2011-08-24 ENCOUNTER — Encounter (HOSPITAL_COMMUNITY): Payer: Self-pay | Admitting: *Deleted

## 2011-08-24 ENCOUNTER — Emergency Department (HOSPITAL_COMMUNITY)
Admission: EM | Admit: 2011-08-24 | Discharge: 2011-08-24 | Disposition: A | Discharge status: Home / Self Care | Payer: BC Managed Care – PPO | Attending: Emergency Medicine | Admitting: Emergency Medicine

## 2011-08-24 DIAGNOSIS — F411 Generalized anxiety disorder: Secondary | ICD-10-CM | POA: Insufficient documentation

## 2011-08-24 DIAGNOSIS — K0889 Other specified disorders of teeth and supporting structures: Secondary | ICD-10-CM

## 2011-08-24 DIAGNOSIS — K089 Disorder of teeth and supporting structures, unspecified: Secondary | ICD-10-CM | POA: Insufficient documentation

## 2011-08-24 DIAGNOSIS — K029 Dental caries, unspecified: Secondary | ICD-10-CM | POA: Insufficient documentation

## 2011-08-24 DIAGNOSIS — F17200 Nicotine dependence, unspecified, uncomplicated: Secondary | ICD-10-CM | POA: Insufficient documentation

## 2011-08-24 MED ORDER — HYDROCODONE-ACETAMINOPHEN 5-325 MG PO TABS: ORAL_TABLET | ORAL | Status: DC

## 2011-08-24 NOTE — ED Notes (Signed)
Patient with no complaints at this time. Respirations even and unlabored. Skin warm/dry. Discharge instructions reviewed with patient at this time. Patient given opportunity to voice concerns/ask questions. Patient discharged at this time and left Emergency Department with steady gait.   

## 2011-08-24 NOTE — ED Notes (Signed)
Toothache since last night,

## 2011-08-24 NOTE — ED Provider Notes (Signed)
History     CSN: [id number]  Arrival date & time 08/24/11  1416   First MD Initiated Contact with Patient 08/24/11 1432      Chief Complaint  Patient presents with  . Dental Pain    HPI Pt was seen at 1445.  Per pt, c/o gradual onset and persistence of constant left lower teeth "pain" for the past several days.  States she has run out of pain medication and is requesting a refill.  Continues to take the abx as prescribed.  Denies fevers, no intra-oral edema, no rash, no facial swelling, no dysphagia, no neck pain.   The condition is aggravated by nothing. The condition is relieved by nothing. The symptoms have been associated with no other complaints. The patient has no significant history of serious medical conditions.     Past Medical History  Diagnosis Date  . Hypokalemia   . Narcotic abuse   . Anxiety     Past Surgical History  Procedure Date  . Tonsillectomy   . Abdominal hysterectomy   . Cholecystectomy   . Tubal ligation   . Carpal tunnel release     Family History  Problem Relation Age of Onset  . Stroke Father     History  Substance Use Topics  . Smoking status: Current Everyday Smoker    Types: Cigarettes  . Smokeless tobacco: Not on file  . Alcohol Use: Yes     rarely    Review of Systems ROS: Statement: All systems negative except as marked or noted in the HPI; Constitutional: Negative for fever and chills. ; ; Eyes: Negative for eye pain and discharge. ; ; ENMT: Positive for dental caries, dental hygiene poor and toothache. Negative for ear pain, bleeding gums, dental injury, facial deformity, facial swelling, hoarseness, nasal congestion, sinus pressure, sore throat, throat swelling and tongue swollen. ; ; Cardiovascular: Negative for chest pain, palpitations, diaphoresis, dyspnea and peripheral edema. ; ; Respiratory: Negative for cough, wheezing and stridor. ; ; Gastrointestinal: Negative for nausea, vomiting, diarrhea and abdominal pain. ; ;  Genitourinary: Negative for dysuria, flank pain and hematuria. ; ; Musculoskeletal: Negative for back pain and neck pain. ; ; Skin: Negative for rash and skin lesion. ; ; Neuro: Negative for headache, lightheadedness and neck stiffness. ;     Allergies  Divalproex sodium; Fioricet-codeine; Ibuprofen; and Ketorolac tromethamine  Home Medications   Current Outpatient Rx  Name Route Sig Dispense Refill  . ALPRAZOLAM 2 MG PO TABS Oral Take 2 mg by mouth 4 (four) times daily as needed. For anxiety     . AMOXICILLIN 500 MG PO CAPS  2 po bid with food 28 capsule 0  . TRAMADOL HCL 50 MG PO TABS Oral Take 50 mg by mouth every 6 (six) hours as needed. For knee and back pain. Maximum dose= 8 tablets per day       BP 117/70  Pulse 95  Temp 98.3 F (36.8 C)  Resp 18  SpO2 99%  Physical Exam 1450: Physical examination: Vital signs and O2 SAT: Reviewed; Constitutional: Well developed, Well nourished, Well hydrated, In no acute distress; Head and Face: Normocephalic, Atraumatic; Eyes: EOMI, PERRL, No scleral icterus; ENMT: Mouth and pharynx normal, Poor dentition, Widespread dental decay, Left TM normal, Right TM normal, Mucous membranes moist, +lower left 1st and 2nd molars with extensive dental decay.  No gingival erythema, edema, fluctuance, or drainage.  No hoarse voice, no drooling, no stridor.  ; Neck: Supple, Full range of motion,  No lymphadenopathy; Cardiovascular: Regular rate and rhythm, No murmur, rub, or gallop; Respiratory: Breath sounds clear & equal bilaterally, No rales, rhonchi, wheezes, or rub, Normal respiratory effort/excursion; Chest: Nontender, Movement normal; Extremities: Pulses normal, No tenderness, No edema; Neuro: AA&Ox3, Major CN grossly intact.  No gross focal motor or sensory deficits in extremities.; Skin: Color normal, No rash, No petechiae, Warm, Dry   ED Course  Procedures    MDM  MDM Reviewed: nursing note, vitals and previous  chart              Laray Anger, DO 08/25/11 1708

## 2011-08-24 NOTE — Discharge Instructions (Signed)
RESOURCE GUIDE  Chronic Pain Problems: Contact [facility]  [phone] Patients need to be referred by their primary care doctor.  Insufficient Money for Medicine: Contact United Way:  call "211" or [facility] [phone].  No Primary Care Doctor: - Call [facility]  [phone] - can help you locate a primary care doctor that  accepts your insurance, provides certain services, etc. - Physician Referral Service- 1-800-533-3463  Agencies that provide inexpensive medical care: - [facility]  [phone] - [facility]  [phone] - [facility]  [phone] - [facility]  [phone] - Planned Parenthood  [phone] - [facility]  [phone]  Medicaid-accepting [place] Providers: - [facility]- [address]  [phone], Mon-Fri 9am-7pm, Sat 9am-1pm - [facility]- [address], [address]  [phone] - [facility]- [address], [address]  [phone] - [facility]- [address] [place] [address]  [phone] - Ve[facility] B[facility]- [address], [address], [phone]  Only accepts [place] Access Medicaid patients after they have their name  applied to their card  Self Pay (no insurance) in [place]: - Sickle Cell Patients: Dr Eric Dean, [facility]  [address], [phone] - [facility]- [address]  [phone]       -     [facility] [place]- [address]       -     [facility]- see information above (Speak to Pam H if you do not have insurance)       -  [facility]- [address], [phone]       -  [facility] [place]- [address],  [phone]       -  [facility]- [address] [place] [address], [phone]       -  Dr Osei-Bonsu-  [address], [place], [phone]       -  [facility]- [address], [phone]       -  [facility]- [address] [place] [address], [phone], also [address], [phone]       -    [facility]- [address], [phone], 1st & 3rd Saturday   every month, 10am-1pm  1) Find a Doctor and Pay Out of Pocket Although you won't have to find out who is covered by your insurance plan, it is a good idea to ask around and get recommendations. You will then need to call the office and see if the doctor you have chosen will accept you as a new patient and what types of options they offer for patients who are self-pay. Some doctors offer discounts or will set up payment plans for their patients who do not have insurance, but you will need to ask so you aren't surprised when you get to your appointment.  2) Contact Your Local Health Department Not all health departments have doctors that can see patients for sick visits, but many do, so it is worth a call to see if yours does. If you don't know where your local health department is, you can check in your phone book. The CDC also has a tool to help you locate your state's health department, and many state websites also have   listings of all of their local health departments.  3) Find a Walk-in Clinic If your illness is not likely to be very severe or complicated, you may want to try a walk in clinic. These are popping up all over the country in pharmacies, drugstores, and shopping centers. They're usually staffed by nurse practitioners or physician assistants that have been trained to treat common illnesses and complaints. They're usually fairly quick and inexpensive. However, if you have serious medical issues or chronic medical problems, these are probably not your best option  STD Testing - [place] [facility] [place], STD Clinic, [address], [place], phone [phone] or 1-877-539-9860.  Monday - Friday, call for an appointment. - [place]  [facility] [place], STD Clinic, [address], [place], phone [phone] or 1-877-539-9860.  Monday - Friday, call for an appointment.  Abuse/Neglect: - [place] Child Abuse Hotline (336) 641-3795 - [place] Child Abuse Hotline 800-378-5315 (After Hours)  Emergency Shelter:  [facility] (336) 271-5985  Maternity Homes: - [facility] (336) 275-9566 - [facility] (704) 372-4663  MRSA Hotline #:   832-7006  [place] Resources  [facility] [place]  [employer] [place] [facility] [address]                 [address]         [address]  [place]                                               [place]                              [place] Phone:  [phone]                                  Phone:  [phone]                   Phone:  [phone]  [place] [facility], [phone] - [place] Services - [facility]- 1-888-581-9988       -     [facility] in [place], [address],                                  336-349-4454, Insurance  [place] Child Abuse Hotline (336) 342-1394 or (336) 342-3537 (After Hours)   Behavioral Health Services  Substance Abuse Resources: - [facility]  336-882-2125 - [facility] 336-784-9470 - [facility] 336-285-9073 - [facility] 336-845-3988 - Residential & Outpatient Substance Abuse Program  800-659-3381  Psychological Services: -  Health  [phone] - [facility]  [phone] - [place] [facility], [address], [place], ACCESS LINE: 1-800-853-5163 or 336-641-4981, Http://www.[facility].com/services/adult.htm  Dental Assistance  If unable to pay or uninsured, contact:  [facility] or [place] [facility] to become qualified for the adult dental  clinic.  Patients with Medicaid:  [facility] [address], [phone], [phone]  If unable   to pay, or uninsured, contact [facility] 206-798-2356) or [facility] 438-375-0022 in [place], [phone] in [facility]) to become qualified for the adult dental clinic  Other Low-Cost Community Dental Services: - [facility]- [address] [place], [place], [place], [address], [phone], [address]. [phone], 2nd and 4th Thursday of the month at 6:30am.  10 clients each day by appointment, can sometimes see walk-in patients if someone does not show for an appointment. [facility]- [address] [place], [place], [address], [phone] - [facility]- [address], [place], [place], [address], [phone] [facility]- [phone] [facility] Department- [phone] [facility] [phone]     Take the prescription as directed.  Continue to take the antibiotic as previously directed.  Call your regular dentist tomorrow morning to schedule a follow up appointment within the next week.  Return to the Emergency Department immediately sooner if worsening.

## 2011-09-27 ENCOUNTER — Emergency Department (HOSPITAL_COMMUNITY)
Admission: EM | Admit: 2011-09-27 | Discharge: 2011-09-28 | Disposition: A | Discharge status: Other Acute Inpatient Hospital | Payer: BC Managed Care – PPO | Attending: Emergency Medicine | Admitting: Emergency Medicine

## 2011-09-27 ENCOUNTER — Encounter (HOSPITAL_COMMUNITY): Payer: Self-pay

## 2011-09-27 ENCOUNTER — Emergency Department (HOSPITAL_COMMUNITY)
Admission: EM | Admit: 2011-09-27 | Discharge: 2011-09-27 | Disposition: A | Discharge status: Home / Self Care | Payer: BC Managed Care – PPO | Attending: Emergency Medicine | Admitting: Emergency Medicine

## 2011-09-27 ENCOUNTER — Encounter (HOSPITAL_COMMUNITY): Payer: Self-pay | Admitting: *Deleted

## 2011-09-27 DIAGNOSIS — F17200 Nicotine dependence, unspecified, uncomplicated: Secondary | ICD-10-CM | POA: Insufficient documentation

## 2011-09-27 DIAGNOSIS — F1910 Other psychoactive substance abuse, uncomplicated: Secondary | ICD-10-CM

## 2011-09-27 DIAGNOSIS — F3289 Other specified depressive episodes: Secondary | ICD-10-CM | POA: Insufficient documentation

## 2011-09-27 DIAGNOSIS — K089 Disorder of teeth and supporting structures, unspecified: Secondary | ICD-10-CM | POA: Insufficient documentation

## 2011-09-27 DIAGNOSIS — R45851 Suicidal ideations: Secondary | ICD-10-CM

## 2011-09-27 DIAGNOSIS — Z79899 Other long term (current) drug therapy: Secondary | ICD-10-CM | POA: Insufficient documentation

## 2011-09-27 DIAGNOSIS — F411 Generalized anxiety disorder: Secondary | ICD-10-CM | POA: Insufficient documentation

## 2011-09-27 DIAGNOSIS — F329 Major depressive disorder, single episode, unspecified: Secondary | ICD-10-CM

## 2011-09-27 DIAGNOSIS — K0889 Other specified disorders of teeth and supporting structures: Secondary | ICD-10-CM

## 2011-09-27 DIAGNOSIS — F32A Depression, unspecified: Secondary | ICD-10-CM

## 2011-09-27 LAB — RAPID URINE DRUG SCREEN, HOSP PERFORMED: Amphetamines: NOT DETECTED

## 2011-09-27 LAB — CBC WITH DIFFERENTIAL/PLATELET
Basophils Absolute: 0 10*3/uL (ref 0.0–0.1)
Basophils Relative: 0 % (ref 0–1)
Eosinophils Absolute: 0.3 10*3/uL (ref 0.0–0.7)
Eosinophils Relative: 3 % (ref 0–5)
HCT: 39.5 % (ref 36.0–46.0)
Lymphocytes Relative: 16 % (ref 12–46)
MCH: 34 pg (ref 26.0–34.0)
MCHC: 34.9 g/dL (ref 30.0–36.0)
MCV: 97.3 fL (ref 78.0–100.0)
Monocytes Absolute: 0.9 10*3/uL (ref 0.1–1.0)
RDW: 11.9 % (ref 11.5–15.5)

## 2011-09-27 LAB — URINALYSIS, ROUTINE W REFLEX MICROSCOPIC
Bilirubin Urine: NEGATIVE
Hgb urine dipstick: NEGATIVE
Ketones, ur: NEGATIVE mg/dL
Protein, ur: NEGATIVE mg/dL
Urobilinogen, UA: 0.2 mg/dL (ref 0.0–1.0)

## 2011-09-27 LAB — BASIC METABOLIC PANEL
CO2: 30 mEq/L (ref 19–32)
Calcium: 9.6 mg/dL (ref 8.4–10.5)
Creatinine, Ser: 0.69 mg/dL (ref 0.50–1.10)

## 2011-09-27 LAB — ETHANOL: Alcohol, Ethyl (B): <11 mg/dL (ref 0–11)

## 2011-09-27 LAB — POCT I-STAT, CHEM 8
Creatinine, Ser: 0.8 mg/dL (ref 0.50–1.10)
HCT: 42 % (ref 36.0–46.0)
Hemoglobin: 14.3 g/dL (ref 12.0–15.0)
Potassium: 3.6 mEq/L (ref 3.5–5.1)
Sodium: 141 mEq/L (ref 135–145)

## 2011-09-27 MED ORDER — HYDROCODONE-ACETAMINOPHEN 5-325 MG PO TABS: 1.0000 | ORAL_TABLET | ORAL | Status: DC | PRN

## 2011-09-27 MED ORDER — LORAZEPAM 1 MG PO TABS
1.0000 mg | ORAL_TABLET | Freq: Once | ORAL | Status: AC
  Administered 2011-09-27: 1 mg via ORAL
  Filled 2011-09-27: qty 1

## 2011-09-27 MED ORDER — NICOTINE 21 MG/24HR TD PT24
21.0000 mg | MEDICATED_PATCH | Freq: Every day | TRANSDERMAL | Status: DC
  Administered 2011-09-27: 21 mg via TRANSDERMAL
  Filled 2011-09-27: qty 1

## 2011-09-27 MED ORDER — HYDROCODONE-ACETAMINOPHEN 5-325 MG PO TABS
1.0000 | ORAL_TABLET | Freq: Once | ORAL | Status: AC
  Administered 2011-09-27: 1 via ORAL
  Filled 2011-09-27: qty 1

## 2011-09-27 MED ORDER — ONDANSETRON HCL 4 MG PO TABS: 4.0000 mg | ORAL_TABLET | Freq: Three times a day (TID) | ORAL | Status: DC | PRN

## 2011-09-27 MED ORDER — ZOLPIDEM TARTRATE 5 MG PO TABS: 5.0000 mg | ORAL_TABLET | Freq: Every evening | ORAL | Status: DC | PRN

## 2011-09-27 MED ORDER — POTASSIUM CHLORIDE 20 MEQ PO PACK
40.0000 meq | PACK | Freq: Once | ORAL | Status: AC
  Administered 2011-09-27: 40 meq via ORAL
  Filled 2011-09-27: qty 2

## 2011-09-27 MED ORDER — ACETAMINOPHEN 325 MG PO TABS
650.0000 mg | ORAL_TABLET | ORAL | Status: DC | PRN
  Administered 2011-09-27 (×2): 650 mg via ORAL
  Filled 2011-09-27 (×2): qty 2

## 2011-09-27 MED ORDER — LORAZEPAM 1 MG PO TABS
1.0000 mg | ORAL_TABLET | Freq: Three times a day (TID) | ORAL | Status: DC | PRN
  Administered 2011-09-27 (×2): 1 mg via ORAL
  Filled 2011-09-27 (×2): qty 1

## 2011-09-27 NOTE — ED Notes (Signed)
Patient sitting in bed eating dinner tray at this time. Sitter at bedside.

## 2011-09-27 NOTE — ED Provider Notes (Signed)
Pt has been accepted to [facility].  Will transfer stable.   Laray Anger, DO 09/27/11 1134

## 2011-09-27 NOTE — ED Notes (Signed)
Notified by Patsy Lager at [facility] that patient has been accepted there. [facility] called.

## 2011-09-27 NOTE — ED Notes (Signed)
Spoke with Blima Rich, RN at [facility]. Notified that pt potassium is now 3.6. Advised that pt will be enroute when [facility] truck becomes available.

## 2011-09-27 NOTE — ED Notes (Signed)
Patient lying in bed watching TV at this time. No obvious distress noted. Sitter at bedside.

## 2011-09-27 NOTE — ED Notes (Signed)
Pt stated she will try to get help for drug addiction on her own, pt instructed to return to ER if unsuccessful.

## 2011-09-27 NOTE — ED Notes (Signed)
Pt notified that transport to [facility] is delayed until her potassium labs return to normal.

## 2011-09-27 NOTE — ED Notes (Signed)
Pt states she needs help to get off of crack, also states she is having suicidal thoughts but does not have a specific plan.  Pt also continues to c/o dental pain.

## 2011-09-27 NOTE — ED Notes (Signed)
Pt reports no relief from ativan.  Requesting something else for anxiety.  edp notified and orders received.

## 2011-09-27 NOTE — ED Notes (Signed)
Was told by Annice Pih at [facility] that "I only have one person working and 3 patients coming here so tell [facility] that they will have to wait with the patient at least 2 hours when they bring the patient. Advised [facility]. Charge nurse called Behavioral Health to check on placement with them. [facility] stated they will "hold off until we hear back from [facility]."
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
## 2011-09-27 NOTE — ED Notes (Signed)
Called report to Irven Baltimore, RN at [facility]. She stated that they cannot accept the pt until potassium level is at a normal level. Reports will hold be for pt until able to transfer. EDP notified

## 2011-09-27 NOTE — ED Notes (Signed)
Spoke with Gearldine Bienenstock at [facility], per Barnett Hatter in intake states they will hold pt's bed until in the morning for pt to arrive.

## 2011-09-27 NOTE — ED Notes (Signed)
Spoke with Annice Pih - intake at [facility]. Advised Annice Pih that [facility] would not be able to transfer patient til after 2300 today. Annice Pih advised that was fine and she would still be accepted after 2300 tonight. Stated she would call back if there was any change.

## 2011-09-27 NOTE — ED Notes (Signed)
Beth Renigar called from [facility] and stated that pt has been accepted by Dr. Betti Cruz.

## 2011-09-27 NOTE — ED Notes (Signed)
Pt complains of tooth/gum pain upper/lower, both sides. Pt also states she wants help with drug addiction problem. Has used crack in past 12 hrs.

## 2011-09-27 NOTE — ED Notes (Signed)
Pt requesting meds for headache and anxiety.  meds given.  Pt calm, cooperative, pleasant.  Sitter at bedside.  nad noted.

## 2011-09-27 NOTE — ED Provider Notes (Signed)
History     CSN: [id number]  Arrival date & time 09/27/11  4098   First MD Initiated Contact with Patient 09/27/11 [phone]      Chief Complaint  Patient presents with  . Medical Clearance    (Consider location/radiation/quality/duration/timing/severity/associated sxs/prior treatment) HPI   [name] is a 31 y.o. female who presents to the Emergency Department complaining of  Depression, suicidal ideation and desire to have help for crack addiction. She was seen earlier tonight for dental pain. She now states her life is not worth living and she feels like she will hurt herself. She has no stated plan. She returned the Rx given her for dental pain (hydrocodone #10).    Past Medical History  Diagnosis Date  . Hypokalemia   . Narcotic abuse   . Anxiety     Past Surgical History  Procedure Date  . Tonsillectomy   . Abdominal hysterectomy   . Cholecystectomy   . Tubal ligation   . Carpal tunnel release     Family History  Problem Relation Age of Onset  . Stroke Father     History  Substance Use Topics  . Smoking status: Current Everyday Smoker -- 1.0 packs/day    Types: Cigarettes  . Smokeless tobacco: Not on file  . Alcohol Use: Yes     rarely    OB History    Grav Para Term Preterm Abortions TAB SAB Ect Mult Living                  Review of Systems  Constitutional: Negative for fever.       10 Systems reviewed and are negative for acute change except as noted in the HPI.  HENT: Positive for dental problem. Negative for congestion.   Eyes: Negative for discharge and redness.  Respiratory: Negative for cough and shortness of breath.   Cardiovascular: Negative for chest pain.  Gastrointestinal: Negative for vomiting and abdominal pain.  Musculoskeletal: Negative for back pain.  Skin: Negative for rash.  Neurological: Negative for syncope, numbness and headaches.  Psychiatric/Behavioral:       Suicidal ideation, depression    Allergies  Divalproex  sodium; Fioricet-codeine; Ibuprofen; and Ketorolac tromethamine  Home Medications   Current Outpatient Rx  Name Route Sig Dispense Refill  . ALPRAZOLAM 2 MG PO TABS Oral Take 2 mg by mouth 4 (four) times daily as needed. For anxiety     . AMOXICILLIN 500 MG PO CAPS  2 po bid with food 28 capsule 0  . HYDROCODONE-ACETAMINOPHEN 5-325 MG PO TABS Oral Take 1 tablet by mouth every 4 (four) hours as needed for pain. 10 tablet 0  . TRAMADOL HCL 50 MG PO TABS Oral Take 50 mg by mouth every 6 (six) hours as needed. For knee and back pain. Maximum dose= 8 tablets per day       BP 113/62  Pulse 73  Temp 97.7 F (36.5 C) (Oral)  Resp 16  SpO2 99%  Physical Exam  Nursing note and vitals reviewed. Constitutional:       Awake, alert, nontoxic appearance.  HENT:  Head: Atraumatic.       Poor dentition with multiple caries and broken teeth. No obvious abscess.  Eyes: Right eye exhibits no discharge. Left eye exhibits no discharge.  Neck: Neck supple.  Pulmonary/Chest: Effort normal. She exhibits no tenderness.  Abdominal: Soft. There is no tenderness. There is no rebound.  Musculoskeletal: She exhibits no tenderness.  Baseline ROM, no obvious new focal weakness.  Neurological:       Mental status and motor strength appears baseline for patient and situation.  Skin: No rash noted.  Psychiatric: She has a normal mood and affect.       Patient states she is suicidal.    ED Course  Procedures (including critical care time)   Labs Reviewed  URINALYSIS, ROUTINE W REFLEX MICROSCOPIC  PREGNANCY, URINE      MDM  Patient with suicidal ideation and depression. To be seen by [facility]. Awaiting labs for medical clearance. UDS should be positive for opiates as she was given opiates at her earlier visit for dental pain.  MDM Reviewed: nursing note and vitals Interpretation: labs          Nicoletta Dress. Colon Branch, MD 09/27/11 [phone]

## 2011-09-27 NOTE — ED Provider Notes (Signed)
History     CSN: [id number]  Arrival date & time 09/27/11  0028   First MD Initiated Contact with Patient 09/27/11 [phone]      Chief Complaint  Patient presents with  . Dental Pain  . Drug Problem    (Consider location/radiation/quality/duration/timing/severity/associated sxs/prior treatment) HPI  [name] is a 31 y.o. female who presents to the Emergency Department complaining of persistent and recurrent lower jaw and dental pain. She has finished antibiotics and has run out of pain medicine. She does not have financial means to see a dentist. She also states she wants help with crack addiction.   Past Medical History  Diagnosis Date  . Hypokalemia   . Narcotic abuse   . Anxiety     Past Surgical History  Procedure Date  . Tonsillectomy   . Abdominal hysterectomy   . Cholecystectomy   . Tubal ligation   . Carpal tunnel release     Family History  Problem Relation Age of Onset  . Stroke Father     History  Substance Use Topics  . Smoking status: Current Everyday Smoker -- 1.0 packs/day    Types: Cigarettes  . Smokeless tobacco: Not on file  . Alcohol Use: Yes     rarely    OB History    Grav Para Term Preterm Abortions TAB SAB Ect Mult Living                  Review of Systems  Constitutional: Negative for fever.       10 Systems reviewed and are negative for acute change except as noted in the HPI.  HENT: Positive for dental problem. Negative for congestion.   Eyes: Negative for discharge and redness.  Respiratory: Negative for cough and shortness of breath.   Cardiovascular: Negative for chest pain.  Gastrointestinal: Negative for vomiting and abdominal pain.  Musculoskeletal: Negative for back pain.  Skin: Negative for rash.  Neurological: Negative for syncope, numbness and headaches.  Psychiatric/Behavioral:       No behavior change.    Allergies  Divalproex sodium; Fioricet-codeine; Ibuprofen; and Ketorolac tromethamine  Home  Medications   Current Outpatient Rx  Name Route Sig Dispense Refill  . ALPRAZOLAM 2 MG PO TABS Oral Take 2 mg by mouth 4 (four) times daily as needed. For anxiety     . AMOXICILLIN 500 MG PO CAPS  2 po bid with food 28 capsule 0  . HYDROCODONE-ACETAMINOPHEN 5-325 MG PO TABS Oral Take 1 tablet by mouth every 4 (four) hours as needed for pain. 10 tablet 0  . TRAMADOL HCL 50 MG PO TABS Oral Take 50 mg by mouth every 6 (six) hours as needed. For knee and back pain. Maximum dose= 8 tablets per day       BP 122/75  Pulse 72  Temp 97.6 F (36.4 C) (Oral)  Resp 18  Ht 5\' 1"  (1.549 m)  Wt 110 lb (49.896 kg)  BMI 20.78 kg/m2  SpO2 99%  Physical Exam  Nursing note and vitals reviewed. Constitutional:       Awake, alert, nontoxic appearance.  HENT:  Head: Atraumatic.       Poor dentition with multiple caries and broken teeth. No obvious abscess.   Eyes: Right eye exhibits no discharge. Left eye exhibits no discharge.  Neck: Neck supple.  Pulmonary/Chest: Effort normal. She exhibits no tenderness.  Abdominal: Soft. There is no tenderness. There is no rebound.  Musculoskeletal: She exhibits no tenderness.  Baseline ROM, no obvious new focal weakness.  Neurological:       Mental status and motor strength appears baseline for patient and situation.  Skin: No rash noted.  Psychiatric: She has a normal mood and affect.    ED Course  Procedures (including critical care time)  0220 Spoke with patient about need for dental help. Spoke with her re crack addiction. Gave her resource information, [facility].   1. Pain, dental       MDM  Patient with recurrent dental pain. Also states she wants help with crack addiction. Given analgesic for dental pain. [facility].Pt stable in ED with no significant deterioration in condition.The patient appears reasonably screened and/or stabilized for discharge and I doubt any other medical condition or other  [facility] requiring further screening, evaluation, or treatment in the ED at this time prior to discharge.  MDM Reviewed: nursing note and vitals           Nicoletta Dress. Colon Branch, MD 09/27/11 [phone]

## 2011-09-27 NOTE — ED Notes (Signed)
Patient requesting Tylenol for headache.  

## 2011-09-27 NOTE — ED Notes (Signed)
Warm blanket given.  Sitter at bedside.  nad noted.

## 2011-09-27 NOTE — ED Notes (Signed)
Pt changed into paper scrubs and has given [place] a urine.

## 2011-09-27 NOTE — ED Notes (Signed)
Patient requesting medications for anxiety. Ativan 1mg  PO prn ordered.

## 2011-09-27 NOTE — ED Notes (Addendum)
Called [facility] regarding transport.  Reports will be after 7pm before pt will be transferred.  Pt sleeping at this time with equal bil chest rise and fall.  nad noted.  Sitter at bedside.

## 2011-09-27 NOTE — ED Notes (Signed)
Discharge instructions reviewed with pt, questions answered. Pt verbalized understanding.  

## 2011-09-27 NOTE — ED Notes (Addendum)
Pt resting in bed at this time. 

## 2011-09-27 NOTE — ED Notes (Signed)
Dr strand talked to pt and she has given back her norco script. The papers were shredded.

## 2011-09-28 ENCOUNTER — Encounter (HOSPITAL_COMMUNITY): Payer: Self-pay | Admitting: *Deleted

## 2011-09-28 ENCOUNTER — Inpatient Hospital Stay (HOSPITAL_COMMUNITY)
Admission: AD | Admit: 2011-09-28 | Discharge: 2011-09-29 | DRG: 745 | Disposition: A | Discharge status: Home / Self Care | Payer: BC Managed Care – PPO | Source: Other Acute Inpatient Hospital | Attending: Psychiatry | Admitting: Psychiatry

## 2011-09-28 DIAGNOSIS — F1994 Other psychoactive substance use, unspecified with psychoactive substance-induced mood disorder: Principal | ICD-10-CM | POA: Diagnosis present

## 2011-09-28 DIAGNOSIS — F1121 Opioid dependence, in remission: Secondary | ICD-10-CM | POA: Diagnosis present

## 2011-09-28 DIAGNOSIS — F1910 Other psychoactive substance abuse, uncomplicated: Secondary | ICD-10-CM

## 2011-09-28 DIAGNOSIS — E349 Endocrine disorder, unspecified: Secondary | ICD-10-CM | POA: Diagnosis present

## 2011-09-28 DIAGNOSIS — F1420 Cocaine dependence, uncomplicated: Secondary | ICD-10-CM | POA: Diagnosis present

## 2011-09-28 DIAGNOSIS — Z79899 Other long term (current) drug therapy: Secondary | ICD-10-CM

## 2011-09-28 DIAGNOSIS — K029 Dental caries, unspecified: Secondary | ICD-10-CM | POA: Diagnosis present

## 2011-09-28 DIAGNOSIS — F17200 Nicotine dependence, unspecified, uncomplicated: Secondary | ICD-10-CM | POA: Diagnosis present

## 2011-09-28 DIAGNOSIS — F1410 Cocaine abuse, uncomplicated: Secondary | ICD-10-CM | POA: Diagnosis present

## 2011-09-28 MED ORDER — PENICILLIN V POTASSIUM 500 MG PO TABS
500.0000 mg | ORAL_TABLET | Freq: Two times a day (BID) | ORAL | Status: DC
  Administered 2011-09-28 – 2011-09-29 (×3): 500 mg via ORAL
  Filled 2011-09-28 (×5): qty 1

## 2011-09-28 MED ORDER — HYDROXYZINE HCL 25 MG PO TABS
25.0000 mg | ORAL_TABLET | ORAL | Status: DC | PRN
  Administered 2011-09-28: 25 mg via ORAL

## 2011-09-28 MED ORDER — TRAZODONE HCL 100 MG PO TABS
100.0000 mg | ORAL_TABLET | Freq: Every day | ORAL | Status: DC
  Administered 2011-09-28: 100 mg via ORAL
  Filled 2011-09-28: qty 1
  Filled 2011-09-28: qty 14
  Filled 2011-09-28: qty 1

## 2011-09-28 MED ORDER — MAGNESIUM HYDROXIDE 400 MG/5ML PO SUSP: 30.0000 mL | Freq: Every day | ORAL | Status: DC | PRN

## 2011-09-28 MED ORDER — ALUM & MAG HYDROXIDE-SIMETH 200-200-20 MG/5ML PO SUSP: 30.0000 mL | ORAL | Status: DC | PRN

## 2011-09-28 MED ORDER — ACETAMINOPHEN 325 MG PO TABS
650.0000 mg | ORAL_TABLET | Freq: Four times a day (QID) | ORAL | Status: DC | PRN
  Administered 2011-09-28: 650 mg via ORAL

## 2011-09-28 MED ORDER — FLUOXETINE HCL 10 MG PO CAPS
10.0000 mg | ORAL_CAPSULE | Freq: Every day | ORAL | Status: DC
  Administered 2011-09-28 – 2011-09-29 (×2): 10 mg via ORAL
  Filled 2011-09-28: qty 1
  Filled 2011-09-28: qty 14
  Filled 2011-09-28 (×3): qty 1

## 2011-09-28 MED ORDER — CHLORHEXIDINE GLUCONATE 0.12 % MT SOLN
15.0000 mL | Freq: Four times a day (QID) | OROMUCOSAL | Status: DC
  Administered 2011-09-28 – 2011-09-29 (×3): 15 mL via OROMUCOSAL
  Filled 2011-09-28 (×8): qty 15

## 2011-09-28 MED ORDER — TRAZODONE HCL 50 MG PO TABS
50.0000 mg | ORAL_TABLET | Freq: Every evening | ORAL | Status: DC | PRN
  Administered 2011-09-28: 50 mg via ORAL
  Filled 2011-09-28: qty 1

## 2011-09-28 MED ORDER — HYDROXYZINE HCL 50 MG PO TABS
50.0000 mg | ORAL_TABLET | Freq: Four times a day (QID) | ORAL | Status: DC | PRN
  Administered 2011-09-28 – 2011-09-29 (×2): 50 mg via ORAL

## 2011-09-28 MED ORDER — NICOTINE 21 MG/24HR TD PT24
21.0000 mg | MEDICATED_PATCH | Freq: Every day | TRANSDERMAL | Status: DC
  Administered 2011-09-28 – 2011-09-29 (×2): 21 mg via TRANSDERMAL
  Filled 2011-09-28 (×4): qty 1

## 2011-09-28 NOTE — Progress Notes (Signed)
Patient ID: DANISA KOPEC, female   DOB: [birth year], 31 y.o.   MRN: [id number]   [facility] Notes:  (Counselor/Nursing/MHT/Case Management/Adjunct)  09/28/2011 1:15 PM  Type of Therapy:  Group Therapy, Dance/Movement Therapy   Participation Level:  Minimal  Participation Quality:  Appropriate  Affect:  Appropriate  Cognitive:  Appropriate  Insight:  Limited  Engagement in Group:  Limited  Engagement in Therapy:  Limited  Modes of Intervention:  Clarification, Problem-solving, Role-play, Socialization and Support  Summary of Progress/Problems:  Therapist and group members discussed motivation for recovery, healthy support systems, and how to ask for help when we need it. Therapist encouraged group members to share positive statements about one another and group members practiced repeating these positive affirmations. Therapist ended group by sharing the 12 promises and encouraged group members to make their own promises and positive statements. Pt. shared that her motivation for recovery is her children.     Cassidi Long 09/28/2011. 3:11 PM

## 2011-09-28 NOTE — Progress Notes (Signed)
Patient ID: [name], female   DOB: 28-Aug-1980, 31 y.o.   MRN: [id number]  After care: Pt. attended and participated in aftercare planning group. Pt. accepted information on suicide prevention, warning signs to look for with suicide and crisis line numbers to use. The pt. agreed to call crisis line numbers if having warning signs or having thoughts of suicide. Pt. listed their current anxiety level as 9 and their current depression level as 8. Pt. shared that she is unsure what will happen when she leaves the hospital and that she is worried.

## 2011-09-28 NOTE — Tx Team (Addendum)
Initial Interdisciplinary Treatment Plan  PATIENT STRENGTHS: (choose at least two) Average or above average intelligence Motivation for treatment/growth Supportive family/friends  PATIENT STRESSORS: Marital or family conflict Substance abuse   PROBLEM LIST: Problem List/Patient Goals Date to be addressed Date deferred Reason deferred Estimated date of resolution  Substance abuse (crack) 09/28/11     Suicidal ideation 09/28/11     depression 09/28/11                                          DISCHARGE CRITERIA:  Improved stabilization in mood, thinking, and/or behavior Motivation to continue treatment in a less acute level of care Need for constant or close observation no longer present Withdrawal symptoms are absent or subacute and managed without 24-hour nursing intervention  PRELIMINARY DISCHARGE PLAN: Attend aftercare/continuing care group Attend 12-step recovery group Return to previous living arrangement  PATIENT/FAMIILY INVOLVEMENT: This treatment plan has been presented to and reviewed with the patient, [name].  The patient and family have been given the opportunity to ask questions and make suggestions.  Juliann Pares 09/28/2011, 2:02 AM

## 2011-09-28 NOTE — H&P (Signed)
  Pt was seen by me today and I agree with the key elements documented in H&P.  
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
## 2011-09-28 NOTE — Progress Notes (Signed)
Patient ID: [name], female   DOB: [birth year], 31 y.o.   MRN: [id number] Pt. denies lethality and A/V/H's and withdrawal pains. At  Pt. did state she has pain in her teeth (due to destruction by crack cocaine) and headache, both rated as "7/10" and accepted Tylenol. Pt. was alert and oriented X's 4 and is pleasant and appropriate with staff and peers. Pt.'s facial scabbed areas are not draining and remain dry and itchy at times.  Pt. cautioned not to scratch her face, due to problems she may have with infection: Pt. is already on antibiotic.  Pt. States she understands: Pt. went to lunch with peers. 14:19--Pt. asked for her new increased-dose of vistaril for anxiety=5/10 and it was given to her.
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
## 2011-09-28 NOTE — H&P (Signed)
Psychiatric Admission Assessment Adult  Patient Identification:  [name] Date of Evaluation:  09/28/2011 31yo MWF CC: detox from crack and help with dental care. History of Present Illness: Reports having her tubes tied when she was 23 and had to have a hysterectomy age 34.Got addicted to opioid pain pills eventually getting off using Suboxone in [place]. Has had cysts on remaining ovary and mood swings. Started using crack in December and is here to stop using before she looses her husband etc.  Has severe dental  disease and no recent GYN follow up.    Past Psychiatric History: No formal care.   Substance Abuse History: Became addicted to opioids a few years ago but got off using Suboxone. Started using crack last December.  Social History:     reports that she has been smoking Cigarettes.  She has been smoking about 1.5 packs per day. She does not have any smokeless tobacco history on file. She reports that she drinks alcohol. She reports that she uses illicit drugs (Cocaine). Finished 11th grade then got a GED . Has done a CNA course and has 4 children 2sons 15-13 2 daughters 65 & 8. Her parents live with her. Father is 13 and had his first stroke when she was 5.   Family Psych History: Mother has mood do but is not diagnosed or treated. Sister has been treated since age 56 for mood disorder.   Past Medical History:     Past Medical History  Diagnosis Date  . Hypokalemia   . Narcotic abuse   . Anxiety        Past Surgical History  Procedure Date  . Tonsillectomy   . Abdominal hysterectomy   . Cholecystectomy   . Tubal ligation   . Carpal tunnel release     Allergies:  Allergies  Allergen Reactions  . Fioricet-Codeine (Butalbital-Apap-Caff-Cod) Itching  . Ibuprofen Other (See Comments)    REACTION: GI UPSET, STOMACH PAIN  . Ketorolac Tromethamine Hives and Other (See Comments)    REACTION: STOMACH PAIN  . Divalproex Sodium Anxiety and Other (See Comments)    'makes me feel really weird' , felt distant from reality    Current Medications:  Prior to Admission medications   Medication Sig Start Date End Date Taking? Authorizing Provider  alprazolam Prudy Feeler) 2 MG tablet Take 2 mg by mouth 4 (four) times daily as needed. For anxiety     Historical Provider, MD  aspirin-acetaminophen-caffeine (EXCEDRIN MIGRAINE) [phone] MG per tablet Take 1 tablet by mouth every 6 (six) hours as needed. FOR HEADACHES    Historical Provider, MD  HYDROcodone-acetaminophen (NORCO/VICODIN) 5-325 MG per tablet Take 1 tablet by mouth every 4 (four) hours as needed for pain. 09/27/11 10/07/11  Nicoletta Dress. Colon Branch, MD  traMADol (ULTRAM) 50 MG tablet Take 50 mg by mouth every 6 (six) hours as needed. For knee and back pain. Maximum dose= 8 tablets per day     Historical Provider, MD    Mental Status Examination/Evaluation: Objective:  Appearance: Disheveled  Psychomotor Activity:  Normal  Eye Contact::  Good  Speech:  Clear and Coherent and Normal Rate  Volume:  Normal  Mood: anxious reports feeling helpless & hopeless -wants to knoew why she needs to get high    Affect:  Full Range  Thought Process:  Clear rational goal oriented   Orientation:  Full  Thought Content:  No AVH/psychosis   Suicidal Thoughts:  No  Homicidal Thoughts:  No  Judgement:  Fair  Insight:  Fair    DIAGNOSIS:    AXIS I Substance Abuse and Substance Induced Mood Disorder  AXIS II Deferred  AXIS III See medical history. R/O estrogen imbalance   AXIS IV economic problems, occupational problems, other psychosocial or environmental problems, problems related to social environment and problems with primary support group  AXIS V 41-50 serious symptoms     Treatment Plan Summary: Admit for safety & stabilization Initiate meds for anxiety-Prozac Vistaril and trazadone  Agree with H&P from ED  Will order  Labs to check for hormonal imbalance

## 2011-09-28 NOTE — Progress Notes (Signed)
Psychoeducational Group Note  Date:  09/28/2011 Time:  1515  Group Topic/Focus:  Conflict Resolution:   The focus of this group is to discuss the conflict resolution process and how it may be used upon discharge.  Participation Level:  Active  Participation Quality:  Appropriate  Affect:  Blunted  Cognitive:  Alert  Insight:  Good  Engagement in Group:  Good  Additional Comments:  none  Ailine Hefferan, Genia Plants 09/28/2011, 4:27 PM

## 2011-09-29 DIAGNOSIS — F1410 Cocaine abuse, uncomplicated: Secondary | ICD-10-CM

## 2011-09-29 LAB — PROGESTERONE: Progesterone: 0.4 ng/mL

## 2011-09-29 LAB — TESTOSTERONE: Testosterone: <10 ng/dL — ABNORMAL LOW (ref 10–70)

## 2011-09-29 MED ORDER — TRAZODONE HCL 100 MG PO TABS: 100.0000 mg | ORAL_TABLET | Freq: Every day | ORAL | Status: DC

## 2011-09-29 MED ORDER — FLUOXETINE HCL 10 MG PO CAPS: 10.0000 mg | ORAL_CAPSULE | Freq: Every day | ORAL | Status: DC

## 2011-09-29 NOTE — H&P (Signed)
Read & reviewed.  Signed for Dr. Theotis Barrio.

## 2011-09-29 NOTE — Progress Notes (Signed)
[facility] Adult Inpatient Family/Significant Other Suicide Prevention Education  Suicide Prevention Education:  Education Completed; Charnel Giles (husband) [phone] has been identified by the patient as the family member/significant other with whom the patient will be residing, and identified as the person(s) who will aid the patient in the event of a mental health crisis (suicidal ideations/suicide attempt).  With written consent from the patient, the family member/significant other has been provided the following suicide prevention education, prior to the and/or following the discharge of the patient.  The suicide prevention education provided includes the following:  Suicide risk factors  Suicide prevention and interventions  National Suicide Hotline telephone number  [facility] assessment telephone number  [facility] Emergency Assistance 911  [facility] and/or Residential Mobile Crisis Unit telephone number  Request made of family/significant other to:  Remove weapons (e.g., guns, rifles, knives), all items previously/currently identified as safety concern.    Remove drugs/medications (over-the-counter, prescriptions, illicit drugs), all items previously/currently identified as a safety concern.  The family member/significant other verbalizes understanding of the suicide prevention education information provided.  The family member/significant other agrees to remove the items of safety concern listed above.  Would like to see pt get a referral to a treatment center Husband is in the process of working with a lawyer to divorce pt Pt can not come home  [facility] 09/29/2011, 9:15 AM

## 2011-09-29 NOTE — Tx Team (Signed)
Interdisciplinary Treatment Plan Update (Adult)  Date:  09/29/2011  Time Reviewed:  10:03 AM   Progress in Treatment: Attending groups: Yes Participating in groups:  Yes Taking medication as prescribed: Yes Tolerating medication:  Yes Family/Significant othe contact made:  Yes Patient understands diagnosis:  Yes Discussing patient identified problems/goals with staff:  Yes Medical problems stabilized or resolved:  Yes Denies suicidal/homicidal ideation: Yes Issues/concerns per patient self-inventory:  None identified Other: N/A  New problem(s) identified: None Identified  Reason for Continuation of Hospitalization: Stable to d/c  Interventions implemented related to continuation of hospitalization: Stable to d/c  Additional comments: N/A  Estimated length of stay: D/C today  Discharge Plan: Pt will follow up with a psychiatrist or therapist in [place].   New goal(s): N/A  Review of initial/current patient goals per problem list:    1.  Goal(s): Address substance use  Met:  Yes  Target date: by discharge  As evidenced by: completed detox protocol and referred to appropriate treatment  2.  Goal (s): Reduce depressive and anxiety symptoms  Met:  Yes  Target date: by discharge  As evidenced by: Reducing depression from a 10 to a 3 as reported by pt.  Pt rates at a 0 today.   3.  Goal(s): Eliminate SI  Met:  Yes  Target date: by discharge  As evidenced by: Pt denies SI.    Attendees: Patient:  [name] 09/29/2011 10:03 AM   Family:     Physician:  Lupe Carney, DO 09/29/2011 10:03 AM   Nursing: Robbie Louis, RN 09/29/2011 10:03 AM   Case Manager:  Reyes Ivan, LCSWA 09/29/2011 10:03 AM   Counselor:  Ronda Fairly, LCSWA 09/29/2011 10:03 AM   Other:  Richelle Ito, LCSW  09/29/2011 10:03 AM   Other:  Chinita Greenland, RN 09/29/2011 10:04 AM   Other:     Other:      Scribe for Treatment Team:   Reyes Ivan 09/29/2011 10:03 AM

## 2011-09-29 NOTE — Progress Notes (Signed)
(  Late entry - system downtime) Met with pt 1:1. Affect flat, anxious with congruent mood. Asked for and was given vistaril prn with relief. Pt also given scheduled trazadone however after admin began to complain about legs feeling restless. Enc pt to report to team in A.M. She denies any other pain or problems. No SI/HI/AVH. Lawrence Marseilles

## 2011-09-29 NOTE — [facility] Suicide Risk Assessment (Signed)
Suicide Risk Assessment  Discharge Assessment      Demographic factors:  Caucasian;Low socioeconomic status;Unemployed;Access to firearms  Current Mental Status Per Nursing Assessment:  On Admission:  Suicidal ideation indicated by patient;Self-harm thoughts At Discharge: Pt denied any SI/HI/thoughts of self harm or acute psychiatric issues in treatment team with clinical, nursing and medical team present.  Current Mental Status Per Physician: Patient seen and evaluated. Chart reviewed. Patient stated that her mood was "better". Her affect was mood congruent and euthymic. She denied any current thoughts of self injurious behavior, suicidal ideation or homicidal ideation. There were no auditory or visual hallucinations, paranoia, delusional thought processes, or mania noted.  Thought process was linear and goal directed.  No psychomotor agitation or retardation was noted. Speech was normal rate, tone and volume. Eye contact was good. Judgment and insight are fair.  Patient has been up and engaged on the unit.  No acute safety concerns reported from team.  No w/d s/s noted.  VS:  Filed Vitals:   09/29/11 0601  BP: 87/61  Pulse: 78  Temp:   Resp:     Loss Factors: Financial problems / change in socioeconomic status;Decline in physical health  Historical Factors: Family history of suicide;Family history of mental illness or substance abuse;Domestic violence in family of origin  Risk Reduction Factors:   Responsible for children under 86 years of age;Sense of responsibility to family;Religious beliefs about death;Living with another person, especially a relative;Positive social support; wants to be employed  Discharge Diagnoses: Stimulant Use Disorder; Opioid Use Disorder; Unspecified Anxiety Disorder; Hypokalemia, resolved   Cognitive Features That Contribute To Risk: none.  Suicide Risk: Pt viewed as a chronic moderate increased risk of harm to self in light of her past hx and risk  factors.  No acute safety concerns on the unit.  Pt contracting for safety and is stable for discharge.  Plan Of Care/Follow-up recommendations: Pt seen and evaluated in treatment team. Chart reviewed.  Pt stable for and requesting discharge. Pt contracting for safety and does not currently meet [place] involuntary commitment criteria for continued hospitalization against her will.  Mental health treatment and continued sobriety will mitigate against the potential increased risk of harm to self and/or others.  Discussed the importance of recovery further with pt, as well as, tools to move forward in a healthy & safe manner.  Pt agreeable with the plan.  Discussed with the team.  Please see orders, follow up appointments per AVS and full discharge summary to be completed by physician extender.  Recommend follow up with NA.  Diet: Regular.  Activity: As tolerated.     Lupe Carney 09/29/2011, 11:49 AM

## 2011-09-29 NOTE — Progress Notes (Signed)
[facility] Case Management Discharge Plan:  Will you be returning to the same living situation after discharge: Yes,  return home At discharge, do you have transportation home?:Yes,  access to transportation Do you have the ability to pay for your medications:Yes,  access to meds   Release of information consent forms completed and in the chart;  Patient's signature needed at discharge.  Patient to Follow up at:  Follow-up Information    Follow up with [facility] on 09/30/2011. (Appointment scheduled at 2:00 pm)    Contact information:   [address] [place], [place] [address] [phone]         Patient denies SI/HI:   Yes,  deneis SI/Hi    Safety Planning and Suicide Prevention discussed:  Yes,  discussed with pt today  Barrier to discharge identified:No.  Summary and Recommendations: Pt attended discharge planning group and actively participated in group.  SW provided pt with today's workbook.  Pt presents with calm mood and affect.  Pt rates depression at a 2 and anxiety at a 3 today.  Pt denies SI.  Pt reports feeling stable to d/c today.  No recommendations from SW.  No further needs voiced by pt.  Pt stable to discharge.     Carmina Miller 09/29/2011, 11:18 AM

## 2011-09-29 NOTE — Progress Notes (Signed)
Pt was discharged home today.  She denied any S/I H/I or A/V hallucinations.    She was given f/u appointment, rx, sample medications, and hotline info booklet.  She voiced understanding to all instructions provided.  She declined the need for smoking cessation materials.  She removed his nicotine patch before he left.

## 2011-09-30 LAB — ESTROGENS, TOTAL: Estrogen: 129.5 pg/mL

## 2011-10-01 NOTE — Progress Notes (Signed)
Patient Discharge Instructions:  After Visit Summary (AVS):   Faxed to:  09/30/2011 Psychiatric Admission Assessment Note:   Faxed to:  09/30/2011 Suicide Risk Assessment - Discharge Assessment:   Faxed to:  09/30/2011 Faxed/Sent to the Next Level Care provider:  09/30/2011  Faxed to [facility] @ [phone]  Wandra Scot, 10/01/2011, 6:01 PM

## 2011-10-09 ENCOUNTER — Emergency Department (HOSPITAL_COMMUNITY)
Admission: EM | Admit: 2011-10-09 | Discharge: 2011-10-09 | Disposition: A | Discharge status: Home / Self Care | Payer: BC Managed Care – PPO | Attending: Emergency Medicine | Admitting: Emergency Medicine

## 2011-10-09 ENCOUNTER — Encounter (HOSPITAL_COMMUNITY): Payer: Self-pay | Admitting: *Deleted

## 2011-10-09 DIAGNOSIS — R5383 Other fatigue: Secondary | ICD-10-CM

## 2011-10-09 DIAGNOSIS — F17200 Nicotine dependence, unspecified, uncomplicated: Secondary | ICD-10-CM | POA: Insufficient documentation

## 2011-10-09 DIAGNOSIS — Z008 Encounter for other general examination: Secondary | ICD-10-CM | POA: Insufficient documentation

## 2011-10-09 LAB — BASIC METABOLIC PANEL
BUN: 4 mg/dL — ABNORMAL LOW (ref 6–23)
CO2: 26 mEq/L (ref 19–32)
Chloride: 102 mEq/L (ref 96–112)
Creatinine, Ser: 0.59 mg/dL (ref 0.50–1.10)
Glucose, Bld: 91 mg/dL (ref 70–99)
Potassium: 3.4 mEq/L — ABNORMAL LOW (ref 3.5–5.1)

## 2011-10-09 LAB — CBC WITH DIFFERENTIAL/PLATELET
Basophils Relative: 1 % (ref 0–1)
HCT: 40.7 % (ref 36.0–46.0)
Hemoglobin: 13.8 g/dL (ref 12.0–15.0)
Lymphocytes Relative: 35 % (ref 12–46)
Lymphs Abs: 3.2 10*3/uL (ref 0.7–4.0)
MCHC: 33.9 g/dL (ref 30.0–36.0)
Monocytes Absolute: 0.7 10*3/uL (ref 0.1–1.0)
Monocytes Relative: 8 % (ref 3–12)
Neutro Abs: 5 10*3/uL (ref 1.7–7.7)
Neutrophils Relative %: 53 % (ref 43–77)
RBC: 4.02 MIL/uL (ref 3.87–5.11)

## 2011-10-09 LAB — URINALYSIS, ROUTINE W REFLEX MICROSCOPIC
Bilirubin Urine: NEGATIVE
Nitrite: NEGATIVE
Protein, ur: NEGATIVE mg/dL
Specific Gravity, Urine: 1.02 (ref 1.005–1.030)
Urobilinogen, UA: 0.2 mg/dL (ref 0.0–1.0)

## 2011-10-09 LAB — RAPID URINE DRUG SCREEN, HOSP PERFORMED
Barbiturates: NOT DETECTED
Cocaine: NOT DETECTED
Opiates: NOT DETECTED

## 2011-10-09 LAB — SALICYLATE LEVEL: Salicylate Lvl: NOT DETECTED mg/dL (ref 2.8–20.0)

## 2011-10-09 NOTE — ED Provider Notes (Signed)
History     CSN: [id number]  Arrival date & time 10/09/11  0157   First MD Initiated Contact with Patient 10/09/11 0247      Chief Complaint  Patient presents with  . Medical Clearance    (Consider location/radiation/quality/duration/timing/severity/associated sxs/prior treatment) HPI  [name] IS A 31 y.o. female brought in by law enforcement to the Emergency Department with IVC paperwork stating patient has been taking hydrocodone and has been sleeping excessively over the last day. Petition states she has abused hydrocodone and they feel she is not safe. Patient was just admitted at [facility] for narcotic detox (09/27/11). She was seen by substance abuse counselor on Monday and referred to a counseling center. Petitioner states that she is missing a full bottle of hydrocodone. Patient denies taking any Rx. She admits to taking her own hydrocodone x 1 yesterday for tooth pain. She denies recent substance abuse since discharge from [facility].She states she and her husband both had a GI illness yesterday with nausea, vomiting and diarrhea. She denies suicidal ideation, HI, hallucinations.   PCP Dr. Sherwood Gambler   Past Medical History  Diagnosis Date  . Hypokalemia   . Narcotic abuse   . Anxiety     Past Surgical History  Procedure Date  . Tonsillectomy   . Abdominal hysterectomy   . Cholecystectomy   . Tubal ligation   . Carpal tunnel release     Family History  Problem Relation Age of Onset  . Stroke Father     History  Substance Use Topics  . Smoking status: Current Everyday Smoker -- 1.5 packs/day    Types: Cigarettes  . Smokeless tobacco: Not on file  . Alcohol Use: Yes     rarely    OB History    Grav Para Term Preterm Abortions TAB SAB Ect Mult Living                  Review of Systems  Constitutional: Negative for fever.       10 Systems reviewed and are negative for acute change except as noted in the HPI.  HENT: Negative for congestion.   Eyes:  Negative for discharge and redness.  Respiratory: Negative for cough and shortness of breath.   Cardiovascular: Negative for chest pain.  Gastrointestinal: Negative for vomiting and abdominal pain.  Musculoskeletal: Negative for back pain.  Skin: Negative for rash.  Neurological: Negative for syncope, numbness and headaches.  Psychiatric/Behavioral:       No behavior change.    Allergies  Fioricet-codeine; Ibuprofen; Ketorolac tromethamine; and Divalproex sodium  Home Medications   Current Outpatient Rx  Name Route Sig Dispense Refill  . FLUOXETINE HCL 10 MG PO CAPS Oral Take 1 capsule (10 mg total) by mouth daily. For depression 30 capsule 0  . TRAZODONE HCL 100 MG PO TABS Oral Take 1 tablet (100 mg total) by mouth at bedtime. For sleep 30 tablet 0    BP 103/51  Pulse 62  Temp 98.7 F (37.1 C) (Oral)  Resp 15  SpO2 100%  Physical Exam  Nursing note and vitals reviewed. Constitutional: She is oriented to person, place, and time. She appears well-developed and well-nourished. No distress.       Sleepy,  alert, nontoxic appearance.  HENT:  Head: Normocephalic and atraumatic.  Right Ear: External ear normal.  Left Ear: External ear normal.  Nose: Nose normal.  Mouth/Throat: Oropharynx is clear and moist.  Eyes: Conjunctivae and EOM are normal. Pupils are  equal, round, and reactive to light. Right eye exhibits no discharge. Left eye exhibits no discharge.  Neck: Neck supple.  Cardiovascular: Normal heart sounds.   Pulmonary/Chest: Effort normal and breath sounds normal. She exhibits no tenderness.  Abdominal: Soft. Bowel sounds are normal. There is no tenderness. There is no rebound.  Musculoskeletal: She exhibits tenderness.       Baseline ROM, no obvious new focal weakness.  Neurological: She is oriented to person, place, and time.       Mental status and motor strength appears baseline for patient and situation.  Skin: No rash noted.  Psychiatric:       Flat affect     ED Course  Procedures (including critical care time)  Results for orders placed during the hospital encounter of 10/09/11  URINALYSIS, ROUTINE W REFLEX MICROSCOPIC      Component Value Range   Color, Urine YELLOW  YELLOW   APPearance CLEAR  CLEAR   Specific Gravity, Urine 1.020  1.005 - 1.030   pH 5.5  5.0 - 8.0   Glucose, UA NEGATIVE  NEGATIVE mg/dL   Hgb urine dipstick NEGATIVE  NEGATIVE   Bilirubin Urine NEGATIVE  NEGATIVE   Ketones, ur NEGATIVE  NEGATIVE mg/dL   Protein, ur NEGATIVE  NEGATIVE mg/dL   Urobilinogen, UA 0.2  0.0 - 1.0 mg/dL   Nitrite NEGATIVE  NEGATIVE   Leukocytes, UA NEGATIVE  NEGATIVE  URINE RAPID DRUG SCREEN (HOSP PERFORMED)      Component Value Range   Opiates NONE DETECTED  NONE DETECTED   Cocaine NONE DETECTED  NONE DETECTED   Benzodiazepines NONE DETECTED  NONE DETECTED   Amphetamines NONE DETECTED  NONE DETECTED   Tetrahydrocannabinol NONE DETECTED  NONE DETECTED   Barbiturates NONE DETECTED  NONE DETECTED  CBC WITH DIFFERENTIAL      Component Value Range   WBC 9.4  4.0 - 10.5 K/uL   RBC 4.02  3.87 - 5.11 MIL/uL   Hemoglobin 13.8  12.0 - 15.0 g/dL   HCT 13.0  86.5 - 78.4 %   MCV 101.2 (*) 78.0 - 100.0 fL   MCH 34.3 (*) 26.0 - 34.0 pg   MCHC 33.9  30.0 - 36.0 g/dL   RDW 69.6  29.5 - 28.4 %   Platelets 245  150 - 400 K/uL   Neutrophils Relative 53  43 - 77 %   Neutro Abs 5.0  1.7 - 7.7 K/uL   Lymphocytes Relative 35  12 - 46 %   Lymphs Abs 3.2  0.7 - 4.0 K/uL   Monocytes Relative 8  3 - 12 %   Monocytes Absolute 0.7  0.1 - 1.0 K/uL   Eosinophils Relative 3  0 - 5 %   Eosinophils Absolute 0.3  0.0 - 0.7 K/uL   Basophils Relative 1  0 - 1 %   Basophils Absolute 0.1  0.0 - 0.1 K/uL  BASIC METABOLIC PANEL      Component Value Range   Sodium 137  135 - 145 mEq/L   Potassium 3.4 (*) 3.5 - 5.1 mEq/L   Chloride 102  96 - 112 mEq/L   CO2 26  19 - 32 mEq/L   Glucose, Bld 91  70 - 99 mg/dL   BUN 4 (*) 6 - 23 mg/dL   Creatinine, Ser 1.32  0.50  - 1.10 mg/dL   Calcium 9.1  8.4 - 44.0 mg/dL   GFR calc non Af Amer >90  >90 mL/min   GFR  calc Af Amer >90  >90 mL/min  ETHANOL      Component Value Range   Alcohol, Ethyl (B) NONE DETECTED  0 - 11 mg/dL  ACETAMINOPHEN LEVEL      Component Value Range   Acetaminophen (Tylenol), Serum NONE DETECTED  10 - 30 ug/mL  SALICYLATE LEVEL      Component Value Range   Salicylate Lvl NONE DETECTED  2.8 - 20.0 mg/dL   1610 Rescinded paperwork.    MDM  Patient brought in with IVC paperwork. She has a h/o substance abuse and recently completed a detox program. She is actively enrolled in an OP program. UDS negative. She is not suicidal, homicidal or psychotic. She does not meet criteria for admission. Rescinded paperwork.Patient to be discharged home. Pt stable in ED with no significant deterioration in condition.The patient appears reasonably screened and/or stabilized for discharge and I doubt any other medical condition or other [facility] requiring further screening, evaluation, or treatment in the ED at this time prior to discharge.  MDM Reviewed: nursing note, vitals and previous chart Interpretation: labs           Nicoletta Dress. Colon Branch, MD 10/09/11 [phone]

## 2011-10-09 NOTE — ED Notes (Signed)
Pt here by IVC from [employer]. paperwork states pt lethargic, slurred speech, was tx at [facility] for rehab & stayed 2 days. Petitioner states is missing a full bottle of hydrocodien pills. Pt states she was sleeping & bought here by police.

## 2011-10-09 NOTE — ED Notes (Signed)
Pt unable to get up out of bed to urinate, order for in and out obtained from dr Colon Branch.

## 2011-10-09 NOTE — ED Notes (Signed)
Pt has been asked to find a ride home, and pt cannot stay awake to find a ride home. resended paperwork has been given to Deputy.

## 2011-10-09 NOTE — ED Notes (Signed)
Pt unable to stay awake during triage, pt states she has not taking any unprescribed medications today.

## 2011-10-25 NOTE — Discharge Summary (Signed)
Physician Discharge Summary Note  Patient:  [name] is an 31 y.o., female MRN:  [id number] DOB:  12-12-1980 Patient phone:  [phone] (home)  Patient address:   [address] [place] [address]  Date of Admission:  09/28/2011  Reason for Admission: see H&P.  Principal Problem:  *Cocaine abuse Active Problems:  Dental decay  Hormonal disorder  Discharge Diagnoses: Stimulant Use Disorder; Opioid Use Disorder; Unspecified Anxiety Disorder; Hypokalemia, resolved   Level of Care:  OP  Hospital Course:  Pt admitted for crisis stabilization, detox and treatment. Pt attended all therapeutic groups, was active in her treatment planning process and agreed to her current medication regimen for further stability during recovery.  There were no acute issues during treatment and she was open to further outpt substance abuse Tx.  All labs were reviewed with her in great detail and medical needs were addressed.  No acute safety issues were noted on the unit. Medications were reviewed with pt and medication education was provided. Mental health treatment, medication management and continued sobriety will mitigate against any increased risk of harm to self and/or others.  Discussed the importance of recovery with pt, as well as, tools to move forward in a healthy & safe manner using the 12 Step Process.    Consults: none.  Significant Diagnostic Studies:  See labs.  Discharge Vitals:   Blood pressure 87/61, pulse 78, temperature 97.9 F (36.6 C), temperature source Oral, resp. rate 20, height 5\' 1"  (1.549 m), weight 50.349 kg (111 lb).  Mental Status Exam: See Mental Status Examination and Suicide Risk Assessment completed by Attending Physician prior to discharge.  Discharge destination:  Home  Is patient on multiple antipsychotic therapies at discharge:  No   Has Patient had three or more failed trials of antipsychotic monotherapy by history:  No  Recommended Plan for Multiple  Antipsychotic Therapies: NA.  Medication List  As of 10/25/2011  8:32 PM   STOP taking these medications         alprazolam 2 MG tablet      aspirin-acetaminophen-caffeine 250-250-65 MG per tablet      HYDROcodone-acetaminophen 5-325 MG per tablet      traMADol 50 MG tablet         TAKE these medications      Indication    FLUoxetine 10 MG capsule   Commonly known as: PROZAC   Take 1 capsule (10 mg total) by mouth daily. For depression       traZODone 100 MG tablet   Commonly known as: DESYREL   Take 1 tablet (100 mg total) by mouth at bedtime. For sleep            Follow-up Information    Follow up with [facility] on 09/30/2011. (Appointment scheduled at 2:00 pm)    Contact information:   [address] [place], [place] [address] [phone]         Plan Of Care/Follow-up recommendations: Pt seen and evaluated in treatment team upon discharge. Chart reviewed. Pt stable for and requesting discharge to home. Pt contracting for safety and does not currently meet [place] involuntary commitment criteria for continued hospitalization against her will. Mental health treatment and continued sobriety will mitigate against the potential increased risk of harm to self and/or others. Discussed the importance of recovery further with pt, as well as, tools to move forward in a healthy & safe manner. Pt agreeable with the plan. Discussed with the team.  Recommend follow  up with NA. Diet: Regular. Activity: As tolerated.    Signed: Lupe Carney 10/25/2011, 8:32 PM

## 2011-11-26 ENCOUNTER — Emergency Department (HOSPITAL_COMMUNITY)
Admission: EM | Admit: 2011-11-26 | Discharge: 2011-11-26 | Disposition: A | Discharge status: Home / Self Care | Payer: BC Managed Care – PPO | Attending: Emergency Medicine | Admitting: Emergency Medicine

## 2011-11-26 ENCOUNTER — Encounter (HOSPITAL_COMMUNITY): Payer: Self-pay | Admitting: Emergency Medicine

## 2011-11-26 DIAGNOSIS — S61219A Laceration without foreign body of unspecified finger without damage to nail, initial encounter: Secondary | ICD-10-CM

## 2011-11-26 DIAGNOSIS — Z823 Family history of stroke: Secondary | ICD-10-CM | POA: Insufficient documentation

## 2011-11-26 DIAGNOSIS — Z888 Allergy status to other drugs, medicaments and biological substances status: Secondary | ICD-10-CM | POA: Insufficient documentation

## 2011-11-26 DIAGNOSIS — F411 Generalized anxiety disorder: Secondary | ICD-10-CM | POA: Insufficient documentation

## 2011-11-26 DIAGNOSIS — Z23 Encounter for immunization: Secondary | ICD-10-CM | POA: Insufficient documentation

## 2011-11-26 DIAGNOSIS — S61209A Unspecified open wound of unspecified finger without damage to nail, initial encounter: Secondary | ICD-10-CM | POA: Insufficient documentation

## 2011-11-26 DIAGNOSIS — W260XXA Contact with knife, initial encounter: Secondary | ICD-10-CM | POA: Insufficient documentation

## 2011-11-26 DIAGNOSIS — Y93G1 Activity, food preparation and clean up: Secondary | ICD-10-CM | POA: Insufficient documentation

## 2011-11-26 DIAGNOSIS — F17200 Nicotine dependence, unspecified, uncomplicated: Secondary | ICD-10-CM | POA: Insufficient documentation

## 2011-11-26 MED ORDER — TETANUS-DIPHTH-ACELL PERTUSSIS 5-2.5-18.5 LF-MCG/0.5 IM SUSP
0.5000 mL | Freq: Once | INTRAMUSCULAR | Status: AC
  Administered 2011-11-26: 0.5 mL via INTRAMUSCULAR
  Filled 2011-11-26: qty 0.5

## 2011-11-26 NOTE — ED Provider Notes (Signed)
History   This chart was scribed for Derwood Kaplan, MD by Gerlean Ren. This patient was seen in room APA08/APA08 and the patient's care was started at 8:08PM.   CSN: [id number]  Arrival date & time 11/26/11  1849   First MD Initiated Contact with Patient 11/26/11 1942      Chief Complaint  Patient presents with  . Laceration    (Consider location/radiation/quality/duration/timing/severity/associated sxs/prior treatment) Patient is a 31 y.o. female presenting with skin laceration. The history is provided by the patient. No language interpreter was used.  Laceration    [name] is a 31 y.o. female who presents to the Emergency Department complaining of laceration to distal portion of left middle finger 1.5 hours ago while cutting chicken with a filet knife.  Pt reports last tetanus shot in 2007.  Pt denies any attempts to clean wound.  Pt is a current everyday smoker (1.5packs/day), reports rare alcohol use, and has a h/o narcotic abuse. Past Medical History  Diagnosis Date  . Hypokalemia   . Narcotic abuse   . Anxiety     Past Surgical History  Procedure Date  . Tonsillectomy   . Abdominal hysterectomy   . Cholecystectomy   . Tubal ligation   . Carpal tunnel release     Family History  Problem Relation Age of Onset  . Stroke Father     History  Substance Use Topics  . Smoking status: Current Every Day Smoker -- 1.5 packs/day    Types: Cigarettes  . Smokeless tobacco: Not on file  . Alcohol Use: Yes     rarely    No OB history provided.  Review of Systems  All other systems reviewed and are negative.    Allergies  Fioricet-codeine; Ibuprofen; Ketorolac tromethamine; and Divalproex sodium  Home Medications   Current Outpatient Rx  Name Route Sig Dispense Refill  . FLUOXETINE HCL 10 MG PO CAPS Oral Take 1 capsule (10 mg total) by mouth daily. For depression 30 capsule 0  . TRAZODONE HCL 100 MG PO TABS Oral Take 1 tablet (100 mg total) by mouth at  bedtime. For sleep 30 tablet 0    BP 119/61  Pulse 94  Temp 97.7 F (36.5 C) (Oral)  Resp 20  Ht 5\' 1"  (1.549 m)  Wt 105 lb (47.628 kg)  BMI 19.84 kg/m2  SpO2 100%  Physical Exam  Nursing note and vitals reviewed. Constitutional: She is oriented to person, place, and time. She appears well-developed and well-nourished.  HENT:  Head: Normocephalic and atraumatic.  Eyes: Conjunctivae normal and EOM are normal.  Neck: Normal range of motion. Neck supple. No tracheal deviation present.  Cardiovascular: Normal rate, regular rhythm and normal heart sounds.   Pulmonary/Chest: Effort normal and breath sounds normal. She has no wheezes.  Musculoskeletal: Normal range of motion. She exhibits no edema.  Neurological: She is alert and oriented to person, place, and time. Coordination normal.  Skin: Skin is warm and dry.       Shallow 0.5-1cm laceration distal to DIP joint and lateral to nail.   Cap refill less than 3 seconds.  Psychiatric: She has a normal mood and affect.    ED Course  Procedures (including critical care time) DIAGNOSTIC STUDIES: Oxygen Saturation is 100% on room air, normal by my interpretation.    COORDINATION OF CARE: 8:12PM- Discussed treatment plan with pt at bedside and pt agreed to plan.    Labs Reviewed - No data to display No results found.  No diagnosis found.    MDM  Medical screening examination/treatment/procedure(s) were performed by me as the supervising physician. Scribe service was utilized for documentation only.  Pt comes in with knife related injury to her digit.  The wound itself wasn't deep, or long. No need for stitches. Plan was to have patient clean the wound by herself, and then i was going to possibly numb her finger and further clean the wound - however, she left prior to me returning. She left without prescription, and i had no chance to give her warning signs on infection.        Derwood Kaplan, MD 11/27/11 2321

## 2011-11-26 NOTE — ED Notes (Signed)
Pt states she does not want to wait for any paperwork, states the doctor told her what to do for follow up etc, and pt walked out with family member

## 2011-11-26 NOTE — ED Notes (Signed)
Patient states she was cutting some chicken and cut her left middle finger.  Laceration noted to distal lateral fingertip beside fingernail.  Bleeding controlled at this time.

## 2011-12-20 ENCOUNTER — Encounter (HOSPITAL_COMMUNITY): Payer: Self-pay | Admitting: *Deleted

## 2011-12-20 ENCOUNTER — Emergency Department (HOSPITAL_COMMUNITY)
Admission: EM | Admit: 2011-12-20 | Discharge: 2011-12-20 | Disposition: A | Discharge status: Home / Self Care | Payer: BC Managed Care – PPO | Attending: Emergency Medicine | Admitting: Emergency Medicine

## 2011-12-20 DIAGNOSIS — S39012A Strain of muscle, fascia and tendon of lower back, initial encounter: Secondary | ICD-10-CM

## 2011-12-20 DIAGNOSIS — IMO0002 Reserved for concepts with insufficient information to code with codable children: Secondary | ICD-10-CM | POA: Insufficient documentation

## 2011-12-20 DIAGNOSIS — F17200 Nicotine dependence, unspecified, uncomplicated: Secondary | ICD-10-CM | POA: Insufficient documentation

## 2011-12-20 DIAGNOSIS — M5416 Radiculopathy, lumbar region: Secondary | ICD-10-CM

## 2011-12-20 DIAGNOSIS — S335XXA Sprain of ligaments of lumbar spine, initial encounter: Secondary | ICD-10-CM | POA: Insufficient documentation

## 2011-12-20 DIAGNOSIS — X500XXA Overexertion from strenuous movement or load, initial encounter: Secondary | ICD-10-CM | POA: Insufficient documentation

## 2011-12-20 MED ORDER — PREDNISONE 20 MG PO TABS
40.0000 mg | ORAL_TABLET | Freq: Once | ORAL | Status: AC
  Administered 2011-12-20: 40 mg via ORAL
  Filled 2011-12-20: qty 2

## 2011-12-20 MED ORDER — PREDNISONE 20 MG PO TABS: 40.0000 mg | ORAL_TABLET | Freq: Every day | ORAL | Status: DC

## 2011-12-20 MED ORDER — HYDROCODONE-ACETAMINOPHEN 5-325 MG PO TABS: 1.0000 | ORAL_TABLET | Freq: Four times a day (QID) | ORAL | Status: DC | PRN

## 2011-12-20 MED ORDER — HYDROCODONE-ACETAMINOPHEN 5-325 MG PO TABS
1.0000 | ORAL_TABLET | Freq: Once | ORAL | Status: AC
  Administered 2011-12-20: 1 via ORAL
  Filled 2011-12-20: qty 1

## 2011-12-20 NOTE — ED Notes (Signed)
Pt with lower back pain since yesterday while taking care of her dad at home, heavy lifting was involved

## 2011-12-20 NOTE — ED Provider Notes (Signed)
History     CSN: [id number]  Arrival date & time 12/20/11  1227   First MD Initiated Contact with Patient 12/20/11 1316      Chief Complaint  Patient presents with  . Back Pain    (Consider location/radiation/quality/duration/timing/severity/associated sxs/prior treatment) HPI Comments: States her father has alzheimers and she was helping her mother change her dad's diaper.  She pulled him off the bed and held in a standing position.  She has had low back pain since.  Patient is a 31 y.o. female presenting with back pain. The history is provided by the patient. No language interpreter was used.  Back Pain  This is a new problem. The current episode started yesterday. The problem occurs constantly. The problem has not changed since onset.The pain is associated with lifting heavy objects. The pain is present in the lumbar spine. The pain radiates to the right thigh. The pain is severe. The symptoms are aggravated by bending, twisting and certain positions. The pain is the same all the time. Associated symptoms include numbness, leg pain and paresthesias. Pertinent negatives include no fever, no bowel incontinence, no perianal numbness, no bladder incontinence, no pelvic pain, no paresis, no tingling and no weakness. She has tried NSAIDs for the symptoms. The treatment provided no ("hurt my stomach") relief.    Past Medical History  Diagnosis Date  . Hypokalemia   . Narcotic abuse   . Anxiety     Past Surgical History  Procedure Date  . Tonsillectomy   . Abdominal hysterectomy   . Cholecystectomy   . Tubal ligation   . Carpal tunnel release     Family History  Problem Relation Age of Onset  . Stroke Father     History  Substance Use Topics  . Smoking status: Current Every Day Smoker -- 1.5 packs/day    Types: Cigarettes  . Smokeless tobacco: Not on file  . Alcohol Use: Yes     rarely    OB History    Grav Para Term Preterm Abortions TAB SAB Ect Mult Living           Review of Systems  Constitutional: Negative for fever and chills.  Gastrointestinal: Negative for bowel incontinence.  Genitourinary: Negative for bladder incontinence and pelvic pain.  Musculoskeletal: Positive for back pain.  Neurological: Positive for numbness and paresthesias. Negative for tingling and weakness.  All other systems reviewed and are negative.    Allergies  Fioricet-codeine; Ibuprofen; Ketorolac tromethamine; and Divalproex sodium  Home Medications   Current Outpatient Rx  Name Route Sig Dispense Refill  . ASPIRIN-ACETAMINOPHEN-CAFFEINE 250-250-65 MG PO TABS Oral Take 1 tablet by mouth every 6 (six) hours as needed. For pain    . HYDROCODONE-ACETAMINOPHEN 5-325 MG PO TABS Oral Take 1 tablet by mouth every 6 (six) hours as needed for pain. 12 tablet 0  . PREDNISONE 20 MG PO TABS Oral Take 2 tablets (40 mg total) by mouth daily. 10 tablet 0    BP 116/62  Pulse 90  Temp 98.3 F (36.8 C) (Oral)  Resp 18  Ht 5\' 2"  (1.575 m)  Wt 110 lb (49.896 kg)  BMI 20.12 kg/m2  SpO2 100%  Physical Exam  Nursing note and vitals reviewed. Constitutional: She is oriented to person, place, and time. She appears well-developed and well-nourished. No distress.  HENT:  Head: Normocephalic and atraumatic.  Eyes: EOM are normal.  Neck: Normal range of motion.  Cardiovascular: Normal rate, regular rhythm and normal heart sounds.  Pulmonary/Chest: Effort normal and breath sounds normal.  Abdominal: Soft. She exhibits no distension. There is no tenderness.  Musculoskeletal: She exhibits tenderness.       Lumbar back: She exhibits decreased range of motion, tenderness and pain.       Back:  Neurological: She is alert and oriented to person, place, and time.  Skin: Skin is warm and dry.  Psychiatric: She has a normal mood and affect. Judgment normal.    ED Course  Procedures (including critical care time)  Labs Reviewed - No data to display No results  found.   1. Lumbar strain   2. Lumbar radiculopathy, acute       MDM  rx-hydrocodone, 12 rx-prednisone 40 mg x 5 d. Ice F/u with PCP prn        Evalina Field, PA 12/20/11 1357

## 2011-12-20 NOTE — ED Provider Notes (Signed)
Medical screening examination/treatment/procedure(s) were performed by non-physician practitioner and as supervising physician I was immediately available for consultation/collaboration.   Charles B. Bernette Mayers, MD 12/20/11 1400

## 2011-12-20 NOTE — ED Notes (Signed)
Patient with no complaints at this time. Respirations even and unlabored. Skin warm/dry. Discharge instructions reviewed with patient at this time. Patient given opportunity to voice concerns/ask questions. Patient discharged at this time and left Emergency Department with steady gait.   

## 2011-12-20 NOTE — ED Notes (Addendum)
Pain is primarily located to right of lumbar spine w/radiation down to R leg and across to L side. Baseline level of pain described as "tolerable", but has severe "peaks" which stop movement abruptly.

## 2011-12-24 ENCOUNTER — Other Ambulatory Visit (HOSPITAL_COMMUNITY): Payer: Self-pay | Admitting: Physician Assistant

## 2011-12-24 DIAGNOSIS — N6019 Diffuse cystic mastopathy of unspecified breast: Secondary | ICD-10-CM

## 2011-12-24 DIAGNOSIS — N63 Unspecified lump in unspecified breast: Secondary | ICD-10-CM

## 2011-12-27 ENCOUNTER — Encounter (HOSPITAL_COMMUNITY): Payer: Self-pay | Admitting: Emergency Medicine

## 2011-12-27 ENCOUNTER — Emergency Department (HOSPITAL_COMMUNITY)
Admission: EM | Admit: 2011-12-27 | Discharge: 2011-12-28 | Disposition: A | Discharge status: Home / Self Care | Payer: BC Managed Care – PPO | Attending: Emergency Medicine | Admitting: Emergency Medicine

## 2011-12-27 DIAGNOSIS — R10814 Left lower quadrant abdominal tenderness: Secondary | ICD-10-CM | POA: Insufficient documentation

## 2011-12-27 DIAGNOSIS — R110 Nausea: Secondary | ICD-10-CM | POA: Insufficient documentation

## 2011-12-27 DIAGNOSIS — R109 Unspecified abdominal pain: Secondary | ICD-10-CM | POA: Insufficient documentation

## 2011-12-27 DIAGNOSIS — Z90710 Acquired absence of both cervix and uterus: Secondary | ICD-10-CM | POA: Insufficient documentation

## 2011-12-27 LAB — URINALYSIS, ROUTINE W REFLEX MICROSCOPIC
Glucose, UA: NEGATIVE mg/dL
Hgb urine dipstick: NEGATIVE
Ketones, ur: NEGATIVE mg/dL
Leukocytes, UA: NEGATIVE
Protein, ur: NEGATIVE mg/dL
pH: 8 (ref 5.0–8.0)

## 2011-12-27 MED ORDER — HYDROMORPHONE HCL PF 1 MG/ML IJ SOLN
1.0000 mg | Freq: Once | INTRAMUSCULAR | Status: AC
Start: 2011-12-27 — End: 2011-12-27
  Administered 2011-12-27: 1 mg via INTRAVENOUS
  Filled 2011-12-27: qty 1

## 2011-12-27 MED ORDER — ONDANSETRON HCL 4 MG/2ML IJ SOLN
4.0000 mg | Freq: Once | INTRAMUSCULAR | Status: AC
  Administered 2011-12-27: 4 mg via INTRAVENOUS
  Filled 2011-12-27: qty 2

## 2011-12-27 NOTE — ED Provider Notes (Signed)
History   This chart was scribed for [employer]. Colon Branch, MD scribed by Magnus Sinning. The patient was seen in room APA12/APA12 at 23:09   CSN: [id number]  Arrival date & time 12/27/11  2019    Chief Complaint  Patient presents with  . Abdominal Pain    (Consider location/radiation/quality/duration/timing/severity/associated sxs/prior treatment) The history is provided by the patient. No language interpreter was used.   [name] is a 31 y.o. female who presents to the Emergency Department complaining of constant moderate LLQ abd pain that began yesterday, but worsened this morning. She states it extends through abd that shoots in her back. She reports associated mild nausea and says pain feels similar to ovarian cysts. She explains she only has one ovary as she has hx of recurrent ovarian cysts.  States nothing eases her abd pain, stating she has tried advil and tramadol, both with no relief. She denies vomiting, diarrhea, fever, and chills. Reports back pain but notes this is not related to chief complaint. Past Medical History  Diagnosis Date  . Hypokalemia   . Narcotic abuse   . Anxiety     Past Surgical History  Procedure Date  . Tonsillectomy   . Abdominal hysterectomy   . Cholecystectomy   . Tubal ligation   . Carpal tunnel release     Family History  Problem Relation Age of Onset  . Stroke Father     History  Substance Use Topics  . Smoking status: Current Every Day Smoker -- 1.5 packs/day    Types: Cigarettes  . Smokeless tobacco: Not on file  . Alcohol Use: Yes     rarely    Review of Systems  All other systems reviewed and are negative.    Allergies  Fioricet-codeine; Ibuprofen; Ketorolac tromethamine; and Divalproex sodium  Home Medications   Current Outpatient Rx  Name Route Sig Dispense Refill  . ASPIRIN-ACETAMINOPHEN-CAFFEINE 250-250-65 MG PO TABS Oral Take 1 tablet by mouth every 6 (six) hours as needed. For pain    .  HYDROCODONE-ACETAMINOPHEN 5-325 MG PO TABS Oral Take 1 tablet by mouth every 6 (six) hours as needed for pain. 12 tablet 0  . PREDNISONE 20 MG PO TABS Oral Take 2 tablets (40 mg total) by mouth daily. 10 tablet 0    BP 99/76  Pulse 85  Temp 98.3 F (36.8 C) (Oral)  Resp 18  Ht 5\' 1"  (1.549 m)  Wt 123 lb 8 oz (56.019 kg)  BMI 23.34 kg/m2  SpO2 100%  Physical Exam  Nursing note and vitals reviewed. Constitutional: She is oriented to person, place, and time. She appears well-developed and well-nourished. No distress.  HENT:  Head: Normocephalic and atraumatic.  Eyes: Conjunctivae normal and EOM are normal.  Neck: Neck supple. No tracheal deviation present.  Cardiovascular: Normal rate and regular rhythm.   Pulmonary/Chest: Effort normal. No respiratory distress.  Abdominal: She exhibits no distension and no mass. There is tenderness. There is guarding. There is no rebound.       LLQ tenderness   Genitourinary:       Pelvic exam performed with permission of pt and female ED tech assist during exam.  External genitalia w/o lesions. Vaginal vault without discharge.   GC/chlam and wet prep obtained and sent to lab from vaginal cuff.  Bimanual exam with mild left sided tenderness. Uterus is not present.  Musculoskeletal: Normal range of motion.  Neurological: She is alert and oriented to person, place, and time. No sensory deficit.  Skin: Skin is dry.  Psychiatric: She has a normal mood and affect. Her behavior is normal.    ED Course  Procedures (including critical care time) DIAGNOSTIC STUDIES: Oxygen Saturation is 100% on room air, normal by my interpretation.    COORDINATION OF CARE: 23:10: Physical exam performed  Results for orders placed during the hospital encounter of 12/27/11  URINALYSIS, ROUTINE W REFLEX MICROSCOPIC      Component Value Range   Color, Urine YELLOW  YELLOW   APPearance CLEAR  CLEAR   Specific Gravity, Urine 1.015  1.005 - 1.030   pH 8.0  5.0 - 8.0    Glucose, UA NEGATIVE  NEGATIVE mg/dL   Hgb urine dipstick NEGATIVE  NEGATIVE   Bilirubin Urine NEGATIVE  NEGATIVE   Ketones, ur NEGATIVE  NEGATIVE mg/dL   Protein, ur NEGATIVE  NEGATIVE mg/dL   Urobilinogen, UA 0.2  0.0 - 1.0 mg/dL   Nitrite NEGATIVE  NEGATIVE   Leukocytes, UA NEGATIVE  NEGATIVE  PREGNANCY, URINE      Component Value Range   Preg Test, Ur NEGATIVE  NEGATIVE  CBC WITH DIFFERENTIAL      Component Value Range   WBC 7.9  4.0 - 10.5 K/uL   RBC 4.27  3.87 - 5.11 MIL/uL   Hemoglobin 14.9  12.0 - 15.0 g/dL   HCT 13.0  86.5 - 78.4 %   MCV 100.9 (*) 78.0 - 100.0 fL   MCH 34.9 (*) 26.0 - 34.0 pg   MCHC 34.6  30.0 - 36.0 g/dL   RDW 69.6  29.5 - 28.4 %   Platelets 300  150 - 400 K/uL   Neutrophils Relative 49  43 - 77 %   Neutro Abs 3.9  1.7 - 7.7 K/uL   Lymphocytes Relative 38  12 - 46 %   Lymphs Abs 3.0  0.7 - 4.0 K/uL   Monocytes Relative 9  3 - 12 %   Monocytes Absolute 0.7  0.1 - 1.0 K/uL   Eosinophils Relative 4  0 - 5 %   Eosinophils Absolute 0.3  0.0 - 0.7 K/uL   Basophils Relative 1  0 - 1 %   Basophils Absolute 0.1  0.0 - 0.1 K/uL  BASIC METABOLIC PANEL      Component Value Range   Sodium 133 (*) 135 - 145 mEq/L   Potassium 3.9  3.5 - 5.1 mEq/L   Chloride 97  96 - 112 mEq/L   CO2 26  19 - 32 mEq/L   Glucose, Bld 82  70 - 99 mg/dL   BUN 9  6 - 23 mg/dL   Creatinine, Ser 1.32  0.50 - 1.10 mg/dL   Calcium 9.3  8.4 - 44.0 mg/dL   GFR calc non Af Amer >90  >90 mL/min   GFR calc Af Amer >90  >90 mL/min  WET PREP, GENITAL      Component Value Range   Yeast Wet Prep HPF POC NONE SEEN  NONE SEEN   Trich, Wet Prep NONE SEEN  NONE SEEN   Clue Cells Wet Prep HPF POC NONE SEEN  NONE SEEN   WBC, Wet Prep HPF POC NONE SEEN  NONE SEEN    MDM  Patient with h/o ovarian cysts here with similar pain on the left side. Labs are unremarkable. Wet prep normal. Given analgesics x 2 with relief of pain. Dx testing d/w pt and husband.  Questions answered.  Verb understanding,  agreeable to d/c home with outpt f/u.  Pt feels improved after observation and/or treatment in ED.Pt stable in ED with no significant deterioration in condition.The patient appears reasonably screened and/or stabilized for discharge and I doubt any other medical condition or other [facility] requiring further screening, evaluation, or treatment in the ED at this time prior to discharge.  I personally performed the services described in this documentation, which was scribed in my presence. The recorded information has been reviewed and considered.   MDM Reviewed: nursing note and vitals Interpretation: labs          Nicoletta Dress. Colon Branch, MD 12/28/11 1610

## 2011-12-27 NOTE — ED Notes (Signed)
Patient complaining of abdominal pain that started in left lower abdomen then radiated to back and right abdomen.

## 2011-12-28 ENCOUNTER — Emergency Department (HOSPITAL_COMMUNITY): Payer: BC Managed Care – PPO

## 2011-12-28 ENCOUNTER — Encounter (HOSPITAL_COMMUNITY): Payer: Self-pay

## 2011-12-28 ENCOUNTER — Emergency Department (HOSPITAL_COMMUNITY)
Admission: EM | Admit: 2011-12-28 | Discharge: 2011-12-29 | Disposition: A | Discharge status: Home / Self Care | Payer: BC Managed Care – PPO | Attending: Emergency Medicine | Admitting: Emergency Medicine

## 2011-12-28 DIAGNOSIS — K5289 Other specified noninfective gastroenteritis and colitis: Secondary | ICD-10-CM | POA: Insufficient documentation

## 2011-12-28 DIAGNOSIS — K529 Noninfective gastroenteritis and colitis, unspecified: Secondary | ICD-10-CM

## 2011-12-28 DIAGNOSIS — R109 Unspecified abdominal pain: Secondary | ICD-10-CM

## 2011-12-28 DIAGNOSIS — R1032 Left lower quadrant pain: Secondary | ICD-10-CM | POA: Insufficient documentation

## 2011-12-28 LAB — CBC WITH DIFFERENTIAL/PLATELET
Eosinophils Absolute: 0.3 10*3/uL (ref 0.0–0.7)
HCT: 43.1 % (ref 36.0–46.0)
Hemoglobin: 14.9 g/dL (ref 12.0–15.0)
Lymphs Abs: 3 10*3/uL (ref 0.7–4.0)
MCH: 34.9 pg — ABNORMAL HIGH (ref 26.0–34.0)
MCHC: 34.6 g/dL (ref 30.0–36.0)
Monocytes Absolute: 0.7 10*3/uL (ref 0.1–1.0)
Monocytes Relative: 9 % (ref 3–12)
Neutro Abs: 3.9 10*3/uL (ref 1.7–7.7)
Neutrophils Relative %: 49 % (ref 43–77)
RBC: 4.27 MIL/uL (ref 3.87–5.11)

## 2011-12-28 LAB — WET PREP, GENITAL
Trich, Wet Prep: NONE SEEN
WBC, Wet Prep HPF POC: NONE SEEN
Yeast Wet Prep HPF POC: NONE SEEN

## 2011-12-28 LAB — BASIC METABOLIC PANEL
BUN: 9 mg/dL (ref 6–23)
Chloride: 97 mEq/L (ref 96–112)
Creatinine, Ser: 0.62 mg/dL (ref 0.50–1.10)
Glucose, Bld: 82 mg/dL (ref 70–99)
Potassium: 3.9 mEq/L (ref 3.5–5.1)

## 2011-12-28 MED ORDER — SODIUM CHLORIDE 0.9 % IV BOLUS (SEPSIS)
1000.0000 mL | Freq: Once | INTRAVENOUS | Status: AC
  Administered 2011-12-29: 1000 mL via INTRAVENOUS

## 2011-12-28 MED ORDER — HYDROCODONE-ACETAMINOPHEN 5-325 MG PO TABS: 1.0000 | ORAL_TABLET | ORAL | Status: DC | PRN

## 2011-12-28 MED ORDER — HYDROMORPHONE HCL PF 1 MG/ML IJ SOLN
1.0000 mg | Freq: Once | INTRAMUSCULAR | Status: AC
  Administered 2011-12-29: 1 mg via INTRAVENOUS
  Filled 2011-12-28: qty 1

## 2011-12-28 MED ORDER — HYDROMORPHONE HCL PF 1 MG/ML IJ SOLN
1.0000 mg | Freq: Once | INTRAMUSCULAR | Status: AC
  Administered 2011-12-28: 1 mg via INTRAVENOUS
  Filled 2011-12-28: qty 1

## 2011-12-28 MED ORDER — ONDANSETRON HCL 4 MG PO TABS: 4.0000 mg | ORAL_TABLET | Freq: Four times a day (QID) | ORAL | Status: DC

## 2011-12-28 MED ORDER — ONDANSETRON HCL 4 MG/2ML IJ SOLN
4.0000 mg | Freq: Once | INTRAMUSCULAR | Status: AC
  Administered 2011-12-29: 4 mg via INTRAVENOUS
  Filled 2011-12-28: qty 2

## 2011-12-28 NOTE — ED Notes (Signed)
Pain in lower abd for several days, seen here last night for same, states is worse tonight

## 2011-12-28 NOTE — ED Provider Notes (Signed)
History     CSN: [id number]  Arrival date & time 12/28/11  2327   First MD Initiated Contact with Patient 12/28/11 2333      No chief complaint on file.   (Consider location/radiation/quality/duration/timing/severity/associated sxs/prior treatment) HPI  [name] is a 31 y.o. female who presents to the Emergency Department complaining of LLQ pain that began tonight with shooting pain across the lower abdomen. Patient was seen in the ER last night for similar pain. She has a h/o ovarian cysts. Labs were normal, pelvic exam with left sided tenderness, wet prep normal, UA normal. She was treated with analgesics and antiemetics with improvement. She stated she took her medicines today. The pain did not start again until tonight after she got up from a nap.Denies fever, chills, nausea, vomiting.  Past Medical History  Diagnosis Date  . Hypokalemia   . Narcotic abuse   . Anxiety     Past Surgical History  Procedure Date  . Tonsillectomy   . Abdominal hysterectomy   . Cholecystectomy   . Tubal ligation   . Carpal tunnel release     Family History  Problem Relation Age of Onset  . Stroke Father     History  Substance Use Topics  . Smoking status: Current Every Day Smoker -- 1.5 packs/day    Types: Cigarettes  . Smokeless tobacco: Not on file  . Alcohol Use: Yes     rarely    OB History    Grav Para Term Preterm Abortions TAB SAB Ect Mult Living                  Review of Systems  Constitutional: Negative for fever.       10 Systems reviewed and are negative for acute change except as noted in the HPI.  HENT: Negative for congestion.   Eyes: Negative for discharge and redness.  Respiratory: Negative for cough and shortness of breath.   Cardiovascular: Negative for chest pain.  Gastrointestinal: Positive for abdominal pain. Negative for vomiting.  Musculoskeletal: Negative for back pain.  Skin: Negative for rash.  Neurological: Negative for syncope, numbness  and headaches.  Psychiatric/Behavioral:       No behavior change.    Allergies  Fioricet-codeine; Ibuprofen; Ketorolac tromethamine; and Divalproex sodium  Home Medications   Current Outpatient Rx  Name Route Sig Dispense Refill  . ASPIRIN-ACETAMINOPHEN-CAFFEINE 250-250-65 MG PO TABS Oral Take 1 tablet by mouth every 6 (six) hours as needed. For pain    . HYDROCODONE-ACETAMINOPHEN 5-325 MG PO TABS Oral Take 1 tablet by mouth every 6 (six) hours as needed for pain. 12 tablet 0  . HYDROCODONE-ACETAMINOPHEN 5-325 MG PO TABS Oral Take 1 tablet by mouth every 4 (four) hours as needed for pain. 15 tablet 0  . ONDANSETRON HCL 4 MG PO TABS Oral Take 1 tablet (4 mg total) by mouth every 6 (six) hours. 12 tablet 0  . PREDNISONE 20 MG PO TABS Oral Take 2 tablets (40 mg total) by mouth daily. 10 tablet 0    There were no vitals taken for this visit.  Physical Exam  Nursing note and vitals reviewed. Constitutional: She appears well-developed and well-nourished.       Awake, alert, nontoxic appearance.  HENT:  Head: Atraumatic.  Eyes: Right eye exhibits no discharge. Left eye exhibits no discharge.  Neck: Neck supple.  Cardiovascular: Normal heart sounds.   Pulmonary/Chest: Effort normal and breath sounds normal. She exhibits no tenderness.  Abdominal: Soft. She  exhibits no mass. There is tenderness. There is no rebound and no guarding.       LLQ tenderness to palpation, no rebound, no guarding.  Musculoskeletal: She exhibits no tenderness.       Baseline ROM, no obvious new focal weakness.  Neurological:       Mental status and motor strength appears baseline for patient and situation.  Skin: No rash noted.  Psychiatric: She has a normal mood and affect.    ED Course  Procedures (including critical care time)  Ct Abdomen Pelvis Wo Contrast  12/29/2011  *RADIOLOGY REPORT*  Clinical Data: Left lower quadrant abdominal pain  CT ABDOMEN AND PELVIS WITHOUT CONTRAST  Technique:   Multidetector CT imaging of the abdomen and pelvis was performed following the standard protocol without intravenous contrast.  Comparison: 03/18/2011  Findings: Limited images through the lung bases demonstrate no significant appreciable abnormality. The heart size is within normal limits. No pleural or pericardial effusion.  Organ abnormality/lesion detection is limited in the absence of intravenous contrast. Within this limitation, unremarkable liver, spleen, pancreas, biliary system, adrenal glands.  Surgically absent gallbladder.  There is mild right renal pelvic fullness. No hydroureter.  No urinary tract calculi identified.  No bowel obstruction. Mild stranding abuts the ascending colon for example on series 2 image 46. Appendix within normal limits.  No free intraperitoneal air or fluid.  No lymphadenopathy.  Normal caliber vasculature.  Thin-walled bladder.  Absent uterus.  No adnexal mass. Right ovary not visualized.  Left ovary appears similar to prior.  No acute osseous finding.  IMPRESSION: There is minimal pericolonic fat stranding abutting the ascending colon.  May reflect a mild colitis or sequelae of prior colitis. No acute abnormality identified on the left.  Mild fullness of the right renal pelvis is nonspecific.  No hydroureter or ureteral calculi identified.  Correlate with urinalysis.   Original Report Authenticated By: Waneta Martins, M.D.      MDM  Patient with persistent lower abdominal pain who has a h/o ovarian cysts. Labs done yesterday were normal. CT shows what may be a mild colitis. Initiated antibiotic therapy. Patient has analgesic and antiemetic at home. Dx testing d/w pt and husband.  Questions answered.  Verb understanding, agreeable to d/c home with outpt f/u.Pt stable in ED with no significant deterioration in condition.The patient appears reasonably screened and/or stabilized for discharge and I doubt any other medical condition or other [facility] requiring further  screening, evaluation, or treatment in the ED at this time prior to discharge.  MDM Reviewed: nursing note, vitals and previous chart Reviewed previous: labs and x-ray Interpretation: CT scan            Nicoletta Dress. Colon Branch, MD 12/29/11 [phone]

## 2011-12-29 ENCOUNTER — Encounter (HOSPITAL_COMMUNITY): Payer: Self-pay | Admitting: *Deleted

## 2011-12-29 ENCOUNTER — Emergency Department (HOSPITAL_COMMUNITY)
Admission: EM | Admit: 2011-12-29 | Discharge: 2011-12-29 | Disposition: A | Discharge status: Home / Self Care | Payer: BC Managed Care – PPO | Attending: Emergency Medicine | Admitting: Emergency Medicine

## 2011-12-29 DIAGNOSIS — Z9851 Tubal ligation status: Secondary | ICD-10-CM | POA: Insufficient documentation

## 2011-12-29 DIAGNOSIS — Z9089 Acquired absence of other organs: Secondary | ICD-10-CM | POA: Insufficient documentation

## 2011-12-29 DIAGNOSIS — E876 Hypokalemia: Secondary | ICD-10-CM | POA: Insufficient documentation

## 2011-12-29 DIAGNOSIS — Z7982 Long term (current) use of aspirin: Secondary | ICD-10-CM | POA: Insufficient documentation

## 2011-12-29 DIAGNOSIS — K529 Noninfective gastroenteritis and colitis, unspecified: Secondary | ICD-10-CM

## 2011-12-29 DIAGNOSIS — F17200 Nicotine dependence, unspecified, uncomplicated: Secondary | ICD-10-CM | POA: Insufficient documentation

## 2011-12-29 DIAGNOSIS — K5289 Other specified noninfective gastroenteritis and colitis: Secondary | ICD-10-CM | POA: Insufficient documentation

## 2011-12-29 DIAGNOSIS — IMO0002 Reserved for concepts with insufficient information to code with codable children: Secondary | ICD-10-CM | POA: Insufficient documentation

## 2011-12-29 DIAGNOSIS — Z90710 Acquired absence of both cervix and uterus: Secondary | ICD-10-CM | POA: Insufficient documentation

## 2011-12-29 DIAGNOSIS — Z79899 Other long term (current) drug therapy: Secondary | ICD-10-CM | POA: Insufficient documentation

## 2011-12-29 DIAGNOSIS — Z792 Long term (current) use of antibiotics: Secondary | ICD-10-CM | POA: Insufficient documentation

## 2011-12-29 DIAGNOSIS — R109 Unspecified abdominal pain: Secondary | ICD-10-CM | POA: Insufficient documentation

## 2011-12-29 LAB — CBC WITH DIFFERENTIAL/PLATELET
Basophils Absolute: 0.1 10*3/uL (ref 0.0–0.1)
Lymphocytes Relative: 43 % (ref 12–46)
Neutro Abs: 4 10*3/uL (ref 1.7–7.7)
Neutrophils Relative %: 46 % (ref 43–77)
Platelets: 380 10*3/uL (ref 150–400)
RBC: 3.99 MIL/uL (ref 3.87–5.11)
RDW: 12 % (ref 11.5–15.5)
WBC: 8.6 10*3/uL (ref 4.0–10.5)

## 2011-12-29 MED ORDER — METRONIDAZOLE 500 MG PO TABS: 500.0000 mg | ORAL_TABLET | Freq: Two times a day (BID) | ORAL | Status: DC

## 2011-12-29 MED ORDER — HYDROMORPHONE HCL PF 1 MG/ML IJ SOLN
1.0000 mg | Freq: Once | INTRAMUSCULAR | Status: AC
  Administered 2011-12-29: 1 mg via INTRAVENOUS
  Filled 2011-12-29: qty 1

## 2011-12-29 MED ORDER — SODIUM CHLORIDE 0.9 % IV BOLUS (SEPSIS)
1000.0000 mL | Freq: Once | INTRAVENOUS | Status: AC
  Administered 2011-12-29: 1000 mL via INTRAVENOUS

## 2011-12-29 MED ORDER — CIPROFLOXACIN HCL 500 MG PO TABS: 500.0000 mg | ORAL_TABLET | Freq: Two times a day (BID) | ORAL | Status: DC

## 2011-12-29 MED ORDER — METRONIDAZOLE 500 MG PO TABS
500.0000 mg | ORAL_TABLET | Freq: Once | ORAL | Status: AC
  Administered 2011-12-29: 500 mg via ORAL
  Filled 2011-12-29: qty 1

## 2011-12-29 MED ORDER — MORPHINE SULFATE 4 MG/ML IJ SOLN
4.0000 mg | Freq: Once | INTRAMUSCULAR | Status: AC
  Administered 2011-12-29: 4 mg via INTRAVENOUS
  Filled 2011-12-29: qty 1

## 2011-12-29 MED ORDER — METRONIDAZOLE IN NACL 5-0.79 MG/ML-% IV SOLN: 500.0000 mg | Freq: Once | INTRAVENOUS | Status: DC

## 2011-12-29 MED ORDER — DIPHENHYDRAMINE HCL 50 MG/ML IJ SOLN
25.0000 mg | Freq: Once | INTRAMUSCULAR | Status: AC
  Administered 2011-12-29: 25 mg via INTRAVENOUS
  Filled 2011-12-29: qty 1

## 2011-12-29 MED ORDER — CIPROFLOXACIN HCL 250 MG PO TABS
500.0000 mg | ORAL_TABLET | Freq: Once | ORAL | Status: AC
  Administered 2011-12-29: 500 mg via ORAL
  Filled 2011-12-29: qty 2

## 2011-12-29 MED ORDER — OXYCODONE-ACETAMINOPHEN 5-325 MG PO TABS: 1.0000 | ORAL_TABLET | Freq: Four times a day (QID) | ORAL | Status: DC | PRN

## 2011-12-29 MED ORDER — ONDANSETRON HCL 4 MG/2ML IJ SOLN
4.0000 mg | Freq: Once | INTRAMUSCULAR | Status: AC
  Administered 2011-12-29: 4 mg via INTRAVENOUS
  Filled 2011-12-29: qty 2

## 2011-12-29 NOTE — ED Provider Notes (Signed)
History     CSN: [id number]  Arrival date & time 12/29/11  1658   First MD Initiated Contact with Patient 12/29/11 1750      Chief Complaint  Patient presents with  . Abdominal Pain    (Consider location/radiation/quality/duration/timing/severity/associated sxs/prior treatment) HPI Comments: This is the third day in a row this patient has been here with complaints of right sided abd pain.  She underwent a ct scan last night which revealed mild stranding in the ascending colon consistent with possible early colitis.  She returns tonight stating that the pain medications are "not even touching her pain".  No fevers or bloody stool.  On her visit two nights ago, the pelvic workup was unremarkable.  Patient is a 31 y.o. female presenting with abdominal pain. The history is provided by the patient.  Abdominal Pain The primary symptoms of the illness include abdominal pain and nausea. The primary symptoms of the illness do not include vomiting or diarrhea. The onset of the illness was gradual. The problem has been gradually worsening.  The patient states that she believes she is currently not pregnant. The patient has not had a change in bowel habit.    Past Medical History  Diagnosis Date  . Hypokalemia   . Narcotic abuse   . Anxiety     Past Surgical History  Procedure Date  . Tonsillectomy   . Abdominal hysterectomy   . Cholecystectomy   . Tubal ligation   . Carpal tunnel release     Family History  Problem Relation Age of Onset  . Stroke Father     History  Substance Use Topics  . Smoking status: Current Every Day Smoker -- 1.5 packs/day    Types: Cigarettes  . Smokeless tobacco: Not on file  . Alcohol Use: Yes     rarely    OB History    Grav Para Term Preterm Abortions TAB SAB Ect Mult Living                  Review of Systems  Gastrointestinal: Positive for nausea and abdominal pain. Negative for vomiting and diarrhea.  All other systems reviewed and  are negative.    Allergies  Fioricet-codeine; Ibuprofen; Ketorolac tromethamine; and Divalproex sodium  Home Medications   Current Outpatient Rx  Name Route Sig Dispense Refill  . ASPIRIN-ACETAMINOPHEN-CAFFEINE 250-250-65 MG PO TABS Oral Take 1 tablet by mouth every 6 (six) hours as needed. For pain    . CIPROFLOXACIN HCL 500 MG PO TABS Oral Take 1 tablet (500 mg total) by mouth 2 (two) times daily. 14 tablet 0  . HYDROCODONE-ACETAMINOPHEN 5-325 MG PO TABS Oral Take 1 tablet by mouth every 6 (six) hours as needed for pain. 12 tablet 0  . HYDROCODONE-ACETAMINOPHEN 5-325 MG PO TABS Oral Take 1 tablet by mouth every 4 (four) hours as needed for pain. 15 tablet 0  . METRONIDAZOLE 500 MG PO TABS Oral Take 1 tablet (500 mg total) by mouth 2 (two) times daily. 14 tablet 0  . ONDANSETRON HCL 4 MG PO TABS Oral Take 1 tablet (4 mg total) by mouth every 6 (six) hours. 12 tablet 0  . PREDNISONE 20 MG PO TABS Oral Take 2 tablets (40 mg total) by mouth daily. 10 tablet 0    BP 116/68  Pulse 88  Temp 98 F (36.7 C) (Oral)  Resp 20  Ht 5\' 1"  (1.549 m)  Wt 123 lb (55.792 kg)  BMI 23.24 kg/m2  SpO2 100%  Physical Exam  Nursing note and vitals reviewed. Constitutional: She is oriented to person, place, and time. She appears well-developed and well-nourished. No distress.  HENT:  Head: Normocephalic and atraumatic.  Mouth/Throat: Oropharynx is clear and moist.  Neck: Normal range of motion. Neck supple.  Cardiovascular: Normal rate and regular rhythm.   No murmur heard. Pulmonary/Chest: Effort normal and breath sounds normal. No respiratory distress.  Abdominal: Soft. Bowel sounds are normal.       There is ttp in the right abdomen.  There is voluntary guarding but no rebound ttp.  Bowel sounds are active.    Musculoskeletal: Normal range of motion. She exhibits no edema.  Neurological: She is alert and oriented to person, place, and time.  Skin: Skin is warm and dry. She is not diaphoretic.      ED Course  Procedures (including critical care time)  Labs Reviewed - No data to display Ct Abdomen Pelvis Wo Contrast  12/29/2011  *RADIOLOGY REPORT*  Clinical Data: Left lower quadrant abdominal pain  CT ABDOMEN AND PELVIS WITHOUT CONTRAST  Technique:  Multidetector CT imaging of the abdomen and pelvis was performed following the standard protocol without intravenous contrast.  Comparison: 03/18/2011  Findings: Limited images through the lung bases demonstrate no significant appreciable abnormality. The heart size is within normal limits. No pleural or pericardial effusion.  Organ abnormality/lesion detection is limited in the absence of intravenous contrast. Within this limitation, unremarkable liver, spleen, pancreas, biliary system, adrenal glands.  Surgically absent gallbladder.  There is mild right renal pelvic fullness. No hydroureter.  No urinary tract calculi identified.  No bowel obstruction. Mild stranding abuts the ascending colon for example on series 2 image 46. Appendix within normal limits.  No free intraperitoneal air or fluid.  No lymphadenopathy.  Normal caliber vasculature.  Thin-walled bladder.  Absent uterus.  No adnexal mass. Right ovary not visualized.  Left ovary appears similar to prior.  No acute osseous finding.  IMPRESSION: There is minimal pericolonic fat stranding abutting the ascending colon.  May reflect a mild colitis or sequelae of prior colitis. No acute abnormality identified on the left.  Mild fullness of the right renal pelvis is nonspecific.  No hydroureter or ureteral calculi identified.  Correlate with urinalysis.   Original Report Authenticated By: Waneta Martins, M.D.      No diagnosis found.    MDM  The patient presents complaining of severe abd pain that is out of proportion with what the cbc and the ct scan from last night would suggest.  She was given two doses of morphine and will be discharged with a few percocet.  I suspect that this  patient is drug seeking and will not prescribe this again.        Geoffery Lyons, MD 12/29/11 1919

## 2011-12-29 NOTE — ED Notes (Signed)
abd pain for 3-4 days, Has been seen here for same.

## 2011-12-29 NOTE — ED Notes (Signed)
States she continues to have pain, MD aware.

## 2011-12-29 NOTE — ED Notes (Signed)
Patient was resting with eyes closed, easily aroused to evaluate patient's pain. NAD noted at this time.

## 2012-01-02 ENCOUNTER — Inpatient Hospital Stay (HOSPITAL_COMMUNITY)
Admission: AD | Admit: 2012-01-02 | Discharge: 2012-01-02 | Discharge status: Against Medical Advice | Payer: BC Managed Care – PPO | Source: Ambulatory Visit | Attending: Obstetrics & Gynecology | Admitting: Obstetrics & Gynecology

## 2012-01-02 ENCOUNTER — Encounter (HOSPITAL_COMMUNITY): Payer: Self-pay

## 2012-01-02 ENCOUNTER — Emergency Department (HOSPITAL_COMMUNITY): Payer: BC Managed Care – PPO

## 2012-01-02 ENCOUNTER — Emergency Department (HOSPITAL_COMMUNITY)
Admission: EM | Admit: 2012-01-02 | Discharge: 2012-01-02 | Disposition: A | Discharge status: Home / Self Care | Payer: BC Managed Care – PPO | Attending: Emergency Medicine | Admitting: Emergency Medicine

## 2012-01-02 DIAGNOSIS — Z79899 Other long term (current) drug therapy: Secondary | ICD-10-CM | POA: Insufficient documentation

## 2012-01-02 DIAGNOSIS — E876 Hypokalemia: Secondary | ICD-10-CM | POA: Insufficient documentation

## 2012-01-02 DIAGNOSIS — R109 Unspecified abdominal pain: Secondary | ICD-10-CM | POA: Insufficient documentation

## 2012-01-02 DIAGNOSIS — Z7982 Long term (current) use of aspirin: Secondary | ICD-10-CM | POA: Insufficient documentation

## 2012-01-02 DIAGNOSIS — K529 Noninfective gastroenteritis and colitis, unspecified: Secondary | ICD-10-CM

## 2012-01-02 DIAGNOSIS — G8929 Other chronic pain: Secondary | ICD-10-CM | POA: Insufficient documentation

## 2012-01-02 DIAGNOSIS — Z90710 Acquired absence of both cervix and uterus: Secondary | ICD-10-CM | POA: Insufficient documentation

## 2012-01-02 DIAGNOSIS — F411 Generalized anxiety disorder: Secondary | ICD-10-CM | POA: Insufficient documentation

## 2012-01-02 DIAGNOSIS — R1032 Left lower quadrant pain: Secondary | ICD-10-CM | POA: Insufficient documentation

## 2012-01-02 DIAGNOSIS — K5289 Other specified noninfective gastroenteritis and colitis: Secondary | ICD-10-CM | POA: Insufficient documentation

## 2012-01-02 DIAGNOSIS — F17200 Nicotine dependence, unspecified, uncomplicated: Secondary | ICD-10-CM | POA: Insufficient documentation

## 2012-01-02 HISTORY — DX: Other chronic pain: G89.29

## 2012-01-02 LAB — URINALYSIS, ROUTINE W REFLEX MICROSCOPIC
Glucose, UA: NEGATIVE mg/dL
Ketones, ur: NEGATIVE mg/dL
Ketones, ur: NEGATIVE mg/dL
Leukocytes, UA: NEGATIVE
Leukocytes, UA: NEGATIVE
Nitrite: NEGATIVE
Nitrite: NEGATIVE
Specific Gravity, Urine: 1.01 (ref 1.005–1.030)
Specific Gravity, Urine: <1.005 — ABNORMAL LOW (ref 1.005–1.030)
pH: 6 (ref 5.0–8.0)
pH: 5.5 (ref 5.0–8.0)

## 2012-01-02 LAB — CBC WITH DIFFERENTIAL/PLATELET
Basophils Absolute: 0 10*3/uL (ref 0.0–0.1)
Basophils Relative: 0 % (ref 0–1)
Eosinophils Absolute: 0.3 10*3/uL (ref 0.0–0.7)
MCH: 34.5 pg — ABNORMAL HIGH (ref 26.0–34.0)
MCHC: 33.9 g/dL (ref 30.0–36.0)
Neutro Abs: 7.8 10*3/uL — ABNORMAL HIGH (ref 1.7–7.7)
Neutrophils Relative %: 66 % (ref 43–77)
Platelets: 368 10*3/uL (ref 150–400)
RDW: 12 % (ref 11.5–15.5)

## 2012-01-02 LAB — COMPREHENSIVE METABOLIC PANEL
AST: 21 U/L (ref 0–37)
Albumin: 4.1 g/dL (ref 3.5–5.2)
Alkaline Phosphatase: 95 U/L (ref 39–117)
BUN: 5 mg/dL — ABNORMAL LOW (ref 6–23)
Chloride: 104 mEq/L (ref 96–112)
Creatinine, Ser: 0.65 mg/dL (ref 0.50–1.10)
Potassium: 3.9 mEq/L (ref 3.5–5.1)
Total Bilirubin: 0.3 mg/dL (ref 0.3–1.2)
Total Protein: 7.3 g/dL (ref 6.0–8.3)

## 2012-01-02 LAB — LIPASE, BLOOD: Lipase: 30 U/L (ref 11–59)

## 2012-01-02 MED ORDER — HYDROMORPHONE HCL PF 1 MG/ML IJ SOLN
1.0000 mg | Freq: Once | INTRAMUSCULAR | Status: AC
  Administered 2012-01-02: 1 mg via INTRAVENOUS
  Filled 2012-01-02: qty 1

## 2012-01-02 MED ORDER — METRONIDAZOLE 500 MG PO TABS: 500.0000 mg | ORAL_TABLET | Freq: Two times a day (BID) | ORAL | Status: DC

## 2012-01-02 MED ORDER — ONDANSETRON HCL 4 MG/2ML IJ SOLN
4.0000 mg | Freq: Once | INTRAMUSCULAR | Status: AC
  Administered 2012-01-02: 4 mg via INTRAVENOUS
  Filled 2012-01-02: qty 2

## 2012-01-02 MED ORDER — HYDROMORPHONE HCL 4 MG PO TABS: 4.0000 mg | ORAL_TABLET | Freq: Four times a day (QID) | ORAL | Status: DC | PRN

## 2012-01-02 NOTE — ED Notes (Signed)
md notified of pt's continued requests for meds for pain, orders obtained.

## 2012-01-02 NOTE — MAU Note (Signed)
Patient states she has had left lower abdominal pain for about 2 weeks. States she has a history of ovarian cysts and states this feels like this. Has had a hysterectomy and only has the left ovary. Denies any bleeding but does have a vaginal discharge.

## 2012-01-02 NOTE — ED Notes (Signed)
Pt given meds for pain, notified of discharge, awaiting papers.

## 2012-01-02 NOTE — MAU Note (Signed)
Patient is not in the lobby when called to a room in MAU.  

## 2012-01-02 NOTE — ED Notes (Signed)
Family at bedside. Patient is stating she is still in pain and upset states she feels like the hospital thinks she comes up here for the heck of it. RN made aware.

## 2012-01-02 NOTE — ED Notes (Signed)
Pt returned from ultrasound c/o of severe pain, "i need a 2nd dose of dilaudid", md notified.

## 2012-01-02 NOTE — ED Notes (Signed)
Pt reports having bil lower quad ab pain for almost 2 weeks, +nausea, no vomiting, no diarrhea, no fever. No vaginal discharge. Ran out of her tramadol this am, has appointment w. Ob/gyn next Friday.

## 2012-01-02 NOTE — ED Notes (Signed)
To ultrasound

## 2012-01-02 NOTE — ED Notes (Signed)
Pt arrived from home by ems, for bil lower quad ab pain for 2 weeks, nausea at times, denies any fever, diarrhea, or vaginal discharge. Has been seen multiple times for same, ran out of her tramadol this am, has appointment next w/ ob/gyn.   Pt signed it today at [facility]. And left before being seen.

## 2012-01-02 NOTE — ED Provider Notes (Signed)
History   This chart was scribed for Angel Lennert, MD by [name]. The patient was seen in room APA04/APA04. Patient's care was started at 1358.  CSN: [id number]  Arrival date & time 01/02/12  1358   First MD Initiated Contact with Patient 01/02/12 1510      Chief Complaint  Patient presents with  . Abdominal Pain   Patient is a 31 y.o. female presenting with abdominal pain. The history is provided by the patient. No language interpreter was used.  Abdominal Pain The primary symptoms of the illness include abdominal pain and nausea. The primary symptoms of the illness do not include fever, dysuria or vaginal discharge. The current episode started more than 2 days ago. The onset of the illness was gradual. The problem has not changed since onset. The patient states that she believes she is currently not pregnant. The patient has not had a change in bowel habit. Risk factors for an acute abdominal problem include a history of abdominal surgery. Symptoms associated with the illness do not include chills, anorexia or constipation. Associated medical issues comments: narcotic abuse, chronic pain.   [name] is a 32 y.o. female who presents to the Emergency Department complaining of 2 weeks of recurrent, constant, unchanged, severe suprapubic abdominal pain with associated moderate nausea. Pain is described as radiating and more sever than previous ovarian cysts. Pt reports being evaluated at [facility] Emergency Department for the past 3 nights in a row, having had Ct scan and a Dx of colonitis. Symptoms have not been treated PTA. She denies vaginal discharge, emesis, diarrhea, and fever. Pt lists a surgical h/o abdominal hysterectomy, cholecystectomy, and tubal ligation and a medical h/o chronic pain and narcotic abuse.   Past Medical History  Diagnosis Date  . Hypokalemia   . Narcotic abuse   . Anxiety   . Chronic pain     Past Surgical History  Procedure Date  . Tonsillectomy     . Abdominal hysterectomy   . Cholecystectomy   . Tubal ligation   . Carpal tunnel release     Family History  Problem Relation Age of Onset  . Stroke Father     History  Substance Use Topics  . Smoking status: Current Every Day Smoker -- 1.5 packs/day    Types: Cigarettes  . Smokeless tobacco: Not on file  . Alcohol Use: Yes     rarely    OB History    Grav Para Term Preterm Abortions TAB SAB Ect Mult Living                  Review of Systems  Constitutional: Negative for fever and chills.  Gastrointestinal: Positive for nausea and abdominal pain. Negative for constipation and anorexia.  Genitourinary: Negative for dysuria and vaginal discharge.  All other systems reviewed and are negative.    Allergies  Fioricet-codeine; Ibuprofen; Ketorolac tromethamine; and Divalproex sodium  Home Medications   Current Outpatient Rx  Name Route Sig Dispense Refill  . ALPRAZOLAM 1 MG PO TABS Oral Take 1 mg by mouth 4 (four) times daily as needed. Anxiety    . ASPIRIN-ACETAMINOPHEN-CAFFEINE 250-250-65 MG PO TABS Oral Take 1 tablet by mouth every 6 (six) hours as needed. For pain    . TRAMADOL HCL 50 MG PO TABS Oral Take 50 mg by mouth every 6 (six) hours as needed. Pain      BP 110/60  Pulse 86  Temp 98.5 F (36.9 C) (Oral)  Resp  19  Ht 5\' 1"  (1.549 m)  Wt 124 lb (56.246 kg)  BMI 23.43 kg/m2  SpO2 100%  Physical Exam  Constitutional: She is oriented to person, place, and time. She appears well-developed.  HENT:  Head: Normocephalic and atraumatic.  Eyes: Conjunctivae normal and EOM are normal. No scleral icterus.  Neck: Neck supple. No thyromegaly present.  Cardiovascular: Normal rate and regular rhythm.  Exam reveals no gallop and no friction rub.   No murmur heard. Pulmonary/Chest: No stridor. She has no wheezes. She has no rales. She exhibits no tenderness.  Abdominal: She exhibits no distension. There is no rebound.       Moderate suprapubic and LLQ tenderness.   Musculoskeletal: Normal range of motion. She exhibits no edema.  Lymphadenopathy:    She has no cervical adenopathy.  Neurological: She is oriented to person, place, and time. Coordination normal.  Skin: No rash noted. No erythema.  Psychiatric: She has a normal mood and affect. Her behavior is normal.    ED Course  Procedures DIAGNOSTIC STUDIES: Oxygen Saturation is 100% on room air, normal by my interpretation.    COORDINATION OF CARE: 15:21- Evaluated Pt. Pt is awake and alert. 15:28- Ordered CBC Panel and Uranalysis. 16:07- Ordered US Transvaginal Non-OB 1 time imaging.  Labs Reviewed  CBC WITH DIFFERENTIAL - Abnormal; Notable for the following:    WBC 11.8 (*)     MCV 101.8 (*)     MCH 34.5 (*)     Neutro Abs 7.8 (*)     All other components within normal limits  URINALYSIS, ROUTINE W REFLEX MICROSCOPIC - Abnormal; Notable for the following:    Specific Gravity, Urine <1.005 (*)     All other components within normal limits  COMPREHENSIVE METABOLIC PANEL  LIPASE, BLOOD   US Transvaginal Non-ob  01/02/2012  *RADIOLOGY REPORT*  Clinical Data: Bilateral pelvic pain.  History of hysterectomy and right oophorectomy   TRANSABDOMINAL AND TRANSVAGINAL ULTRASOUND OF PELVIS  Technique:  Both transabdominal and transvaginal ultrasound examinations of the pelvis were performed.  Transabdominal technique was performed for global imaging of the pelvis including uterus, ovaries, adnexal regions, and pelvic cul-de-sac.  It was necessary to proceed with endovaginal exam following the transabdominal exam to visualize the left ovary.  Comparison:  None.  Findings: Uterus:  Previous hysterectomy.  Endometrium: Not applicable  Right ovary: Status post right oophorectomy.  Left ovary: Appears normal measuring 3.1 x 2.1 x 2.3 cm.  Other Findings:  No free fluid.  IMPRESSION:  1.  Normal appearance of the right ovary. 2.  Status post hysterectomy and right oophorectomy   Original Report Authenticated  By: [name], M.D.    US Pelvis Complete  01/02/2012  *RADIOLOGY REPORT*  Clinical Data: Bilateral pelvic pain.  History of hysterectomy and right oophorectomy   TRANSABDOMINAL AND TRANSVAGINAL ULTRASOUND OF PELVIS  Technique:  Both transabdominal and transvaginal ultrasound examinations of the pelvis were performed.  Transabdominal technique was performed for global imaging of the pelvis including uterus, ovaries, adnexal regions, and pelvic cul-de-sac.  It was necessary to proceed with endovaginal exam following the transabdominal exam to visualize the left ovary.  Comparison:  None.  Findings: Uterus:  Previous hysterectomy.  Endometrium: Not applicable  Right ovary: Status post right oophorectomy.  Left ovary: Appears normal measuring 3.1 x 2.1 x 2.3 cm.  Other Findings:  No free fluid.  IMPRESSION:  1.  Normal appearance of the right ovary. 2.  Status post hysterectomy and right  oophorectomy   Original Report Authenticated By: [name], M.D.      No diagnosis found.    MDM       The chart was scribed for me under my direct supervision.  I personally performed the history, physical, and medical decision making and all procedures in the evaluation of this patient.Angel Lennert, MD 01/02/12 [phone]

## 2012-01-02 NOTE — ED Notes (Signed)
Pt upset over not getting any additional meds for pain, md notified of pt's continued requests for pain meds. No orders given.

## 2012-01-14 ENCOUNTER — Encounter (HOSPITAL_COMMUNITY): Payer: Self-pay

## 2012-01-16 ENCOUNTER — Encounter: Payer: Self-pay | Admitting: Urgent Care

## 2012-01-16 ENCOUNTER — Ambulatory Visit (INDEPENDENT_AMBULATORY_CARE_PROVIDER_SITE_OTHER): Payer: BC Managed Care – PPO | Admitting: Urgent Care

## 2012-01-16 ENCOUNTER — Encounter (HOSPITAL_COMMUNITY): Payer: Self-pay | Admitting: *Deleted

## 2012-01-16 ENCOUNTER — Other Ambulatory Visit: Payer: Self-pay | Admitting: Internal Medicine

## 2012-01-16 ENCOUNTER — Emergency Department (HOSPITAL_COMMUNITY)
Admission: EM | Admit: 2012-01-16 | Discharge: 2012-01-16 | Disposition: A | Discharge status: Home / Self Care | Payer: BC Managed Care – PPO | Attending: Emergency Medicine | Admitting: Emergency Medicine

## 2012-01-16 VITALS — BP 97/67 | HR 95 | Temp 97.6°F | Ht 61.0 in | Wt 124.0 lb

## 2012-01-16 DIAGNOSIS — F17200 Nicotine dependence, unspecified, uncomplicated: Secondary | ICD-10-CM | POA: Insufficient documentation

## 2012-01-16 DIAGNOSIS — K5289 Other specified noninfective gastroenteritis and colitis: Secondary | ICD-10-CM

## 2012-01-16 DIAGNOSIS — G8929 Other chronic pain: Secondary | ICD-10-CM | POA: Insufficient documentation

## 2012-01-16 DIAGNOSIS — R109 Unspecified abdominal pain: Secondary | ICD-10-CM

## 2012-01-16 DIAGNOSIS — K529 Noninfective gastroenteritis and colitis, unspecified: Secondary | ICD-10-CM

## 2012-01-16 DIAGNOSIS — F411 Generalized anxiety disorder: Secondary | ICD-10-CM | POA: Insufficient documentation

## 2012-01-16 DIAGNOSIS — R110 Nausea: Secondary | ICD-10-CM | POA: Insufficient documentation

## 2012-01-16 MED ORDER — PEG 3350-KCL-NA BICARB-NACL 420 G PO SOLR: 4000.0000 mL | ORAL | Status: DC

## 2012-01-16 MED ORDER — OXYCODONE-ACETAMINOPHEN 5-325 MG PO TABS
2.0000 | ORAL_TABLET | Freq: Once | ORAL | Status: AC
  Administered 2012-01-16: 2 via ORAL
  Filled 2012-01-16: qty 2

## 2012-01-16 NOTE — Patient Instructions (Addendum)
Colonoscopy with Dr. Rourk.  

## 2012-01-16 NOTE — Assessment & Plan Note (Addendum)
[name] is a pleasant 31 y.o. female with chronic abdominal pain.  LLQ pain that is quit severe, intermittent.  Interestingly nonspecific findings of possible mild colitis on right (ascending colon) without a change in bowel habits.  Hx multiple abdominal surgeries & past narcotic/polysubstance abuse.  Colonoscopy with Dr Jena Gauss to further evaluate colitis on CT & r/o inflammatory bowel disease.  I have discussed risks & benefits which include, but are not limited to, bleeding, infection, perforation & drug reaction.  The patient agrees with this plan & written consent will be obtained.    Phenergan 12.5mg  IV 30 min prior if BP will tolerate day of procedure to augment sedation Given pt's hx of narcotic abuse, she was advised to contact her PCP for pain medication

## 2012-01-16 NOTE — ED Notes (Signed)
abd pain x 1 month per pt, +nausea, denies vomiting or diarrhea, LBM this morning

## 2012-01-16 NOTE — Progress Notes (Signed)
Primary Care Physician:  Cassell Smiles., MD Primary Gastroenterologist:  Dr. Jena Gauss  Chief Complaint  Patient presents with  . Abdominal Pain  . Bloated    HPI:  [name] is a 31 y.o. female here as a new patient for evaluation of abdominal pain.  She has had visits to the emergency department for this. She tells me Dilaudid if the only thing that helps her pain.  She has tried tylenol & OTC meds.  She previously has a history of narcotic abuse. Has history of chronic abdominal pain, endometriosis, and multiple abdominal surgeries as below. She felt like this pain is similar to her ovarian cysts. She describes the pain as 10/10 in the  LLQ.  Pain radiates to her back. Curling up in a ball is the only thing that helps. Pain is intermittent "like contractions when having a baby".  He has nausea but denies any vomiting.  She has heartburn a few times per month.  Denies dysphagia, odynophagia or anorexia.  Her weight is stable. Denies any change in bowel habits. Usually has it bowel movement daily or QOD.  Denies rectal bleeding or melena. Recent CT abd/pelvis shows a mild ascending colitis.  Recent pelvic ultrasound benign.  She is status post hysterectomy for endometriosis, but her left ovary remains. S  Noncontrast CT abdomen and pelvis:There is minimal pericolonic fat stranding abutting the ascending colon. May reflect a mild colitis or sequelae of prior colitis. No acute abnormality identified on the left.  Mild fullness of the right renal pelvis is nonspecific. No hydroureter or ureteral calculi identified. Correlate with urinalysis.   Pelvic/transvaginal ultrasound:1. Normal appearance of the right ovary. 2. Status post hysterectomy and right oophorectomy  Recent Results (from the past 1008 hour(s))  URINALYSIS, ROUTINE W REFLEX MICROSCOPIC   Collection Time   12/27/11 11:00 PM      Component Value Range   Color, Urine YELLOW  YELLOW   APPearance CLEAR  CLEAR   Specific Gravity, Urine  1.015  1.005 - 1.030   pH 8.0  5.0 - 8.0   Glucose, UA NEGATIVE  NEGATIVE mg/dL   Hgb urine dipstick NEGATIVE  NEGATIVE   Bilirubin Urine NEGATIVE  NEGATIVE   Ketones, ur NEGATIVE  NEGATIVE mg/dL   Protein, ur NEGATIVE  NEGATIVE mg/dL   Urobilinogen, UA 0.2  0.0 - 1.0 mg/dL   Nitrite NEGATIVE  NEGATIVE   Leukocytes, UA NEGATIVE  NEGATIVE  PREGNANCY, URINE   Collection Time   12/27/11 11:00 PM      Component Value Range   Preg Test, Ur NEGATIVE  NEGATIVE  CBC WITH DIFFERENTIAL   Collection Time   12/27/11 11:29 PM      Component Value Range   WBC 7.9  4.0 - 10.5 K/uL   RBC 4.27  3.87 - 5.11 MIL/uL   Hemoglobin 14.9  12.0 - 15.0 g/dL   HCT 04.5  40.9 - 81.1 %   MCV 100.9 (*) 78.0 - 100.0 fL   MCH 34.9 (*) 26.0 - 34.0 pg   MCHC 34.6  30.0 - 36.0 g/dL   RDW 91.4  78.2 - 95.6 %   Platelets 300  150 - 400 K/uL   Neutrophils Relative 49  43 - 77 %   Neutro Abs 3.9  1.7 - 7.7 K/uL   Lymphocytes Relative 38  12 - 46 %   Lymphs Abs 3.0  0.7 - 4.0 K/uL   Monocytes Relative 9  3 - 12 %   Monocytes  Absolute 0.7  0.1 - 1.0 K/uL   Eosinophils Relative 4  0 - 5 %   Eosinophils Absolute 0.3  0.0 - 0.7 K/uL   Basophils Relative 1  0 - 1 %   Basophils Absolute 0.1  0.0 - 0.1 K/uL  BASIC METABOLIC PANEL   Collection Time   12/27/11 11:29 PM      Component Value Range   Sodium 133 (*) 135 - 145 mEq/L   Potassium 3.9  3.5 - 5.1 mEq/L   Chloride 97  96 - 112 mEq/L   CO2 26  19 - 32 mEq/L   Glucose, Bld 82  70 - 99 mg/dL   BUN 9  6 - 23 mg/dL   Creatinine, Ser 1.61  0.50 - 1.10 mg/dL   Calcium 9.3  8.4 - 09.6 mg/dL   GFR calc non Af Amer >90  >90 mL/min   GFR calc Af Amer >90  >90 mL/min  GC/CHLAMYDIA PROBE AMP, GENITAL   Collection Time   12/28/11 12:32 AM      Component Value Range   GC Probe Amp, Genital NEGATIVE  NEGATIVE   Chlamydia, DNA Probe NEGATIVE  NEGATIVE  WET PREP, GENITAL   Collection Time   12/28/11 12:33 AM      Component Value Range   Yeast Wet Prep HPF POC NONE  SEEN  NONE SEEN   Trich, Wet Prep NONE SEEN  NONE SEEN   Clue Cells Wet Prep HPF POC NONE SEEN  NONE SEEN   WBC, Wet Prep HPF POC NONE SEEN  NONE SEEN  CBC WITH DIFFERENTIAL   Collection Time   12/29/11  6:03 PM      Component Value Range   WBC 8.6  4.0 - 10.5 K/uL   RBC 3.99  3.87 - 5.11 MIL/uL   Hemoglobin 14.0  12.0 - 15.0 g/dL   HCT 04.5  40.9 - 81.1 %   MCV 101.0 (*) 78.0 - 100.0 fL   MCH 35.1 (*) 26.0 - 34.0 pg   MCHC 34.7  30.0 - 36.0 g/dL   RDW 91.4  78.2 - 95.6 %   Platelets 380  150 - 400 K/uL   Neutrophils Relative 46  43 - 77 %   Neutro Abs 4.0  1.7 - 7.7 K/uL   Lymphocytes Relative 43  12 - 46 %   Lymphs Abs 3.7  0.7 - 4.0 K/uL   Monocytes Relative 6  3 - 12 %   Monocytes Absolute 0.6  0.1 - 1.0 K/uL   Eosinophils Relative 5  0 - 5 %   Eosinophils Absolute 0.4  0.0 - 0.7 K/uL   Basophils Relative 1  0 - 1 %   Basophils Absolute 0.1  0.0 - 0.1 K/uL  URINALYSIS, ROUTINE W REFLEX MICROSCOPIC   Collection Time   01/02/12 12:20 PM      Component Value Range   Color, Urine YELLOW  YELLOW   APPearance CLEAR  CLEAR   Specific Gravity, Urine 1.010  1.005 - 1.030   pH 6.0  5.0 - 8.0   Glucose, UA NEGATIVE  NEGATIVE mg/dL   Hgb urine dipstick NEGATIVE  NEGATIVE   Bilirubin Urine NEGATIVE  NEGATIVE   Ketones, ur NEGATIVE  NEGATIVE mg/dL   Protein, ur NEGATIVE  NEGATIVE mg/dL   Urobilinogen, UA 0.2  0.0 - 1.0 mg/dL   Nitrite NEGATIVE  NEGATIVE   Leukocytes, UA NEGATIVE  NEGATIVE  URINALYSIS, ROUTINE W REFLEX MICROSCOPIC   Collection Time  01/02/12  3:46 PM      Component Value Range   Color, Urine YELLOW  YELLOW   APPearance CLEAR  CLEAR   Specific Gravity, Urine <1.005 (*) 1.005 - 1.030   pH 5.5  5.0 - 8.0   Glucose, UA NEGATIVE  NEGATIVE mg/dL   Hgb urine dipstick NEGATIVE  NEGATIVE   Bilirubin Urine NEGATIVE  NEGATIVE   Ketones, ur NEGATIVE  NEGATIVE mg/dL   Protein, ur NEGATIVE  NEGATIVE mg/dL   Urobilinogen, UA 0.2  0.0 - 1.0 mg/dL   Nitrite NEGATIVE   NEGATIVE   Leukocytes, UA NEGATIVE  NEGATIVE  CBC WITH DIFFERENTIAL   Collection Time   01/02/12  4:42 PM      Component Value Range   WBC 11.8 (*) 4.0 - 10.5 K/uL   RBC 4.00  3.87 - 5.11 MIL/uL   Hemoglobin 13.8  12.0 - 15.0 g/dL   HCT 40.9  81.1 - 91.4 %   MCV 101.8 (*) 78.0 - 100.0 fL   MCH 34.5 (*) 26.0 - 34.0 pg   MCHC 33.9  30.0 - 36.0 g/dL   RDW 78.2  95.6 - 21.3 %   Platelets 368  150 - 400 K/uL   Neutrophils Relative 66  43 - 77 %   Neutro Abs 7.8 (*) 1.7 - 7.7 K/uL   Lymphocytes Relative 26  12 - 46 %   Lymphs Abs 3.1  0.7 - 4.0 K/uL   Monocytes Relative 5  3 - 12 %   Monocytes Absolute 0.6  0.1 - 1.0 K/uL   Eosinophils Relative 3  0 - 5 %   Eosinophils Absolute 0.3  0.0 - 0.7 K/uL   Basophils Relative 0  0 - 1 %   Basophils Absolute 0.0  0.0 - 0.1 K/uL  COMPREHENSIVE METABOLIC PANEL   Collection Time   01/02/12  4:42 PM      Component Value Range   Sodium 142  135 - 145 mEq/L   Potassium 3.9  3.5 - 5.1 mEq/L   Chloride 104  96 - 112 mEq/L   CO2 30  19 - 32 mEq/L   Glucose, Bld 86  70 - 99 mg/dL   BUN 5 (*) 6 - 23 mg/dL   Creatinine, Ser 0.86  0.50 - 1.10 mg/dL   Calcium 9.3  8.4 - 57.8 mg/dL   Total Protein 7.3  6.0 - 8.3 g/dL   Albumin 4.1  3.5 - 5.2 g/dL   AST 21  0 - 37 U/L   ALT 37 (*) 0 - 35 U/L   Alkaline Phosphatase 95  39 - 117 U/L   Total Bilirubin 0.3  0.3 - 1.2 mg/dL   GFR calc non Af Amer >90  >90 mL/min   GFR calc Af Amer >90  >90 mL/min  LIPASE, BLOOD   Collection Time   01/02/12  4:42 PM      Component Value Range   Lipase 30  11 - 59 U/L   Past Medical History  Diagnosis Date  . Hypokalemia   . Narcotic abuse     5 yrs ago lortab, off few yrs  . Anxiety   . Chronic pain     Past Surgical History  Procedure Date  . Tonsillectomy   . Abdominal hysterectomy     left ovary remains  . Cholecystectomy 2010  . Tubal ligation   . Carpal tunnel release     both    Current Outpatient Prescriptions  Medication  Sig Dispense Refill   . ALPRAZolam (XANAX) 1 MG tablet Take 1 mg by mouth 4 (four) times daily as needed. Anxiety      . [DISCONTINUED] POTASSIUM PO Take 1 tablet by mouth daily.          Allergies as of 01/16/2012 - Review Complete 01/16/2012  Allergen Reaction Noted  . Fioricet-codeine (butalbital-apap-caff-cod) Itching 08/24/2011  . Ibuprofen Other (See Comments) 02/24/2011  . Ketorolac tromethamine Hives and Other (See Comments) 01/27/2011  . Divalproex sodium Anxiety and Other (See Comments) 09/19/2010    Family History:There is no known family history of colorectal carcinoma , liver disease, or inflammatory bowel disease.   Problem Relation Age of Onset  . Stroke Father   . Diverticulitis Father     History   Social History  . Marital Status: Married    Spouse Name: N/A    Number of Children: 4  . Years of Education: N/A   Occupational History  . homemaker    Social History Main Topics  . Smoking status: Current Every Day Smoker -- 1.5 packs/day for 18 years    Types: Cigarettes  . Smokeless tobacco: Not on file  . Alcohol Use: Yes     Comment: rarely  . Drug Use: Yes    Special: Cocaine     Comment: last use 5 months ago  . Sexually Active: Yes    Birth Control/ Protection: Surgical   Other Topics Concern  . Not on file   Social History Narrative  . No narrative on file    Review of Systems: Gen: Denies any fever, chills, sweats, anorexia, fatigue, weakness, malaise, weight loss, and sleep disorder CV: Denies chest pain, angina, palpitations, syncope, orthopnea, PND, peripheral edema, and claudication. Resp: Denies dyspnea at rest, dyspnea with exercise, cough, sputum, wheezing, coughing up blood, and pleurisy. GI: Denies vomiting blood, jaundice, and fecal incontinence.    GU : +increased urinary frequency, dysuria.  UA normal. MS: Denies joint pain, limitation of movement, and swelling, stiffness, low back pain, extremity pain. Denies muscle weakness, cramps, atrophy.    Derm: Denies rash, itching, dry skin, hives, moles, warts, or unhealing ulcers.  Psych: Denies depression, anxiety, memory loss, suicidal ideation, hallucinations, paranoia, and confusion. Heme: Denies bruising, bleeding, and enlarged lymph nodes. Neuro:  Denies any headaches, dizziness, paresthesias. Endo:  Denies any problems with DM, thyroid, adrenal function.  Physical Exam: BP 97/67  Pulse 95  Temp 97.6 F (36.4 C) (Temporal)  Ht 5\' 1"  (1.549 m)  Wt 124 lb (56.246 kg)  BMI 23.43 kg/m2 No LMP recorded. Patient has had a hysterectomy. General:   Alert,  Well-developed, well-nourished, pleasant and cooperative in NAD.  Accompanied by 2 daughters. Head:  Normocephalic and atraumatic. Eyes:  Sclera clear, no icterus.   Conjunctiva pink. Ears:  Normal auditory acuity. Nose:  No deformity, discharge, or lesions. Mouth:  No deformity or lesions,oropharynx pink & moist. Neck:  Supple; no masses or thyromegaly. Lungs:  Clear throughout to auscultation.   No wheezes, crackles, or rhonchi. No acute distress. Heart:  Regular rate and rhythm; no murmurs, clicks, rubs,  or gallops. Abdomen:  Normal bowel sounds.  No bruits.  Soft,non-distended without masses, hepatosplenomegaly or hernias noted.  +mild TTP LLQ.  No guarding or rebound tenderness.   Rectal:  Deferred. Msk:  Symmetrical without gross deformities. Normal posture. Pulses:  Normal pulses noted. Extremities:  No clubbing or edema. Neurologic:  Alert and  oriented x4;  grossly normal neurologically. Skin:  Multiple  tattoos.  Intact without significant lesions or rashes. Lymph Nodes:  No significant cervical adenopathy. Psych:  Alert and cooperative. Normal mood and affect.

## 2012-01-16 NOTE — Progress Notes (Signed)
Faxed to PCP

## 2012-01-16 NOTE — ED Provider Notes (Signed)
History   This chart was scribed for Charles B. Bernette Mayers, MD by ED Scribe Toya Smothers. The patient was seen in room APA14/[facility]14. Patient's care was started at 1440.  CSN: [id number]  Arrival date & time 01/16/12  1440   First MD Initiated Contact with Patient 01/16/12 1509      Chief Complaint  Patient presents with  . Abdominal Pain   HPI  JOVAN BONA is a 31 y.o. female who presents to the Emergency Department complaining of 1 month of recurrent, gradual onset,  moderate lower abdominal pain, with associate nausea. Pain is neither aggravated or alleviated by anything. Pt has been evaluated multiple time in ED for similar symptoms in the past month. She denotes no change in quality despite use of Rx. No fever, chills, cough, congestion, rhinorrhea, chest pain, SOB, or n/v/d. Hx includes Hypokalemia, Abdominal hysterectomy, Tubal ligation, and chronic pain. She was seen at GI doctor this morning for same and scheduled for colonoscopy. She has extensive ED workup for her symptoms including CT and [place] in the last several weeks.     Past Medical History  Diagnosis Date  . Hypokalemia   . Narcotic abuse     5 yrs ago lortab, off few yrs  . Anxiety   . Chronic pain     Past Surgical History  Procedure Date  . Tonsillectomy   . Abdominal hysterectomy     left ovary remains  . Cholecystectomy 2010  . Tubal ligation   . Carpal tunnel release     both    Family History  Problem Relation Age of Onset  . Stroke Father   . Diverticulitis Father     History  Substance Use Topics  . Smoking status: Current Every Day Smoker -- 1.5 packs/day for 18 years    Types: Cigarettes  . Smokeless tobacco: Not on file  . Alcohol Use: Yes     Comment: rarely   Review of Systems  Gastrointestinal: Positive for nausea and abdominal pain. Negative for vomiting, diarrhea and constipation.  All other systems reviewed and are negative.    Allergies  Fioricet-codeine; Ibuprofen; Ketorolac  tromethamine; and Divalproex sodium  Home Medications   Current Outpatient Rx  Name  Route  Sig  Dispense  Refill  . ACETAMINOPHEN 500 MG PO TABS   Oral   Take 500 mg by mouth every 6 (six) hours as needed. For pain         . ALPRAZOLAM 1 MG PO TABS   Oral   Take 1 mg by mouth 4 (four) times daily as needed. Anxiety         . PEG 3350-KCL-NA BICARB-NACL 420 G PO SOLR   Oral   Take 4,000 mLs by mouth as directed.   4000 mL   0     BP 110/57  Pulse 57  Temp 98 F (36.7 C) (Oral)  Resp 18  Ht 5\' 1"  (1.549 m)  Wt 123 lb (55.792 kg)  BMI 23.24 kg/m2  SpO2 100%  Physical Exam  Nursing note and vitals reviewed. Constitutional: She is oriented to person, place, and time. She appears well-developed and well-nourished.  HENT:  Head: Normocephalic and atraumatic.  Eyes: EOM are normal. Pupils are equal, round, and reactive to light.  Neck: Normal range of motion. Neck supple.  Cardiovascular: Normal rate, normal heart sounds and intact distal pulses.   Pulmonary/Chest: Effort normal and breath sounds normal.  Abdominal: Bowel sounds are normal. She exhibits no distension  and no mass. There is no rebound.       Mild left lower abdominal pain.  Musculoskeletal: Normal range of motion. She exhibits no edema and no tenderness.  Neurological: She is alert and oriented to person, place, and time. She has normal strength. No cranial nerve deficit or sensory deficit.  Skin: Skin is warm and dry. No rash noted.  Psychiatric: She has a normal mood and affect.    ED Course  Procedures DIAGNOSTIC STUDIES: Oxygen Saturation is 100% on room air, normal by my interpretation.    COORDINATION OF CARE: 15:20- Evaluated Pt. Pt is awake, alert, and without distress. 15:22- PT advised that no further ED workup was necessary. She needs to continue with outpatient evaluation of her pain. Advised to contact her PCP or GI doctor for long term pain control and that no further RX for narcotics  would be written in the ED. Given a single dose of pain medications here.   Labs Reviewed - No data to display No results found.   No diagnosis found.    MDM  Chronic abdominal pain with extensive ED workup for same.       I personally performed the services described in this documentation, which was scribed in my presence. The recorded information has been reviewed and is accurate.     Charles B. Bernette Mayers, MD 01/16/12 1538

## 2012-01-16 NOTE — ED Notes (Signed)
Pt reports having abdominal pain x 36 hrs. Pt denies vomiting and fever. Pt reports pain is in left side and lower abdomin. Pt states last BM was this morning.  NAD noted at this time.

## 2012-01-16 NOTE — Assessment & Plan Note (Signed)
See colitis 

## 2012-01-22 ENCOUNTER — Emergency Department (HOSPITAL_COMMUNITY)
Admission: EM | Admit: 2012-01-22 | Discharge: 2012-01-22 | Disposition: A | Discharge status: Home / Self Care | Payer: BC Managed Care – PPO | Attending: Emergency Medicine | Admitting: Emergency Medicine

## 2012-01-22 ENCOUNTER — Encounter (HOSPITAL_COMMUNITY): Payer: Self-pay | Admitting: *Deleted

## 2012-01-22 ENCOUNTER — Emergency Department (HOSPITAL_COMMUNITY): Payer: BC Managed Care – PPO

## 2012-01-22 DIAGNOSIS — S5010XA Contusion of unspecified forearm, initial encounter: Secondary | ICD-10-CM

## 2012-01-22 DIAGNOSIS — F1111 Opioid abuse, in remission: Secondary | ICD-10-CM | POA: Insufficient documentation

## 2012-01-22 DIAGNOSIS — F17200 Nicotine dependence, unspecified, uncomplicated: Secondary | ICD-10-CM | POA: Insufficient documentation

## 2012-01-22 DIAGNOSIS — G8929 Other chronic pain: Secondary | ICD-10-CM | POA: Insufficient documentation

## 2012-01-22 DIAGNOSIS — Y929 Unspecified place or not applicable: Secondary | ICD-10-CM | POA: Insufficient documentation

## 2012-01-22 DIAGNOSIS — Y939 Activity, unspecified: Secondary | ICD-10-CM | POA: Insufficient documentation

## 2012-01-22 DIAGNOSIS — Z791 Long term (current) use of non-steroidal anti-inflammatories (NSAID): Secondary | ICD-10-CM | POA: Insufficient documentation

## 2012-01-22 DIAGNOSIS — E876 Hypokalemia: Secondary | ICD-10-CM | POA: Insufficient documentation

## 2012-01-22 DIAGNOSIS — F411 Generalized anxiety disorder: Secondary | ICD-10-CM | POA: Insufficient documentation

## 2012-01-22 DIAGNOSIS — S40029A Contusion of unspecified upper arm, initial encounter: Secondary | ICD-10-CM | POA: Insufficient documentation

## 2012-01-22 DIAGNOSIS — W2209XA Striking against other stationary object, initial encounter: Secondary | ICD-10-CM | POA: Insufficient documentation

## 2012-01-22 DIAGNOSIS — S60229A Contusion of unspecified hand, initial encounter: Secondary | ICD-10-CM

## 2012-01-22 NOTE — ED Provider Notes (Signed)
History     CSN: [id number]  Arrival date & time 01/22/12  1715   First MD Initiated Contact with Patient 01/22/12 1914      Chief Complaint  Patient presents with  . Arm Pain    `    (Consider location/radiation/quality/duration/timing/severity/associated sxs/prior treatment) HPI Comments: Patient states a Tree surgeon into her right arm and hand earlier today. She has pain with movement of her fingers and pain with touching the forearm area. There is no broken skin areas and no bleeding. The patient denies being on any antiplatelet medications. The patient also states that she had carpal tunnel surgery of this arm many years ago but has not had any other medical problems related to the right upper extremity.  Patient is a 31 y.o. female presenting with arm pain. The history is provided by the patient.  Arm Pain This is a new problem. The current episode started today. The problem has been gradually worsening. Pertinent negatives include no abdominal pain, arthralgias, chest pain, coughing or neck pain. Exacerbated by: movement and palpation. She has tried nothing for the symptoms. The treatment provided no relief.    Past Medical History  Diagnosis Date  . Hypokalemia   . Narcotic abuse     5 yrs ago lortab, off few yrs  . Anxiety   . Chronic pain     Past Surgical History  Procedure Date  . Tonsillectomy   . Abdominal hysterectomy     left ovary remains  . Cholecystectomy 2010  . Tubal ligation   . Carpal tunnel release     both    Family History  Problem Relation Age of Onset  . Stroke Father   . Diverticulitis Father     History  Substance Use Topics  . Smoking status: Current Every Day Smoker -- 1.5 packs/day for 18 years    Types: Cigarettes  . Smokeless tobacco: Not on file  . Alcohol Use: Yes     Comment: rarely    OB History    Grav Para Term Preterm Abortions TAB SAB Ect Mult Living                  Review of Systems  Constitutional:  Negative for activity change.       All ROS Neg except as noted in HPI  HENT: Negative for nosebleeds and neck pain.   Eyes: Negative for photophobia and discharge.  Respiratory: Negative for cough, shortness of breath and wheezing.   Cardiovascular: Negative for chest pain and palpitations.  Gastrointestinal: Negative for abdominal pain and blood in stool.  Genitourinary: Negative for dysuria, frequency and hematuria.  Musculoskeletal: Negative for back pain and arthralgias.  Skin: Negative.   Neurological: Negative for dizziness, seizures and speech difficulty.  Psychiatric/Behavioral: Negative for hallucinations and confusion.    Allergies  Fioricet-codeine; Ibuprofen; Ketorolac tromethamine; and Divalproex sodium  Home Medications   Current Outpatient Rx  Name  Route  Sig  Dispense  Refill  . ACETAMINOPHEN 500 MG PO TABS   Oral   Take 500 mg by mouth every 6 (six) hours as needed. For pain         . ALPRAZOLAM 1 MG PO TABS   Oral   Take 1 mg by mouth 4 (four) times daily as needed. Anxiety         . PEG 3350-KCL-NA BICARB-NACL 420 G PO SOLR   Oral   Take 4,000 mLs by mouth as directed.   4000 mL  0     There were no vitals taken for this visit.  Physical Exam  Nursing note and vitals reviewed. Constitutional: She is oriented to person, place, and time. She appears well-developed and well-nourished.  Non-toxic appearance.  HENT:  Head: Normocephalic.  Right Ear: Tympanic membrane and external ear normal.  Left Ear: Tympanic membrane and external ear normal.  Eyes: EOM and lids are normal. Pupils are equal, round, and reactive to light.  Neck: Normal range of motion. Neck supple. Carotid bruit is not present.  Cardiovascular: Normal rate, regular rhythm, normal heart sounds, intact distal pulses and normal pulses.   Pulmonary/Chest: Breath sounds normal. No respiratory distress.  Abdominal: Soft. Bowel sounds are normal. There is no tenderness. There is no  guarding.  Musculoskeletal: Normal range of motion.       There is a small bruise to the dorsum of the right hand. There is a bruise to the ulnar aspect of the right forearm. There is full range of motion of the right shoulder and elbow. There is no effusion of either joint area. There is good range of motion of the wrists but with some soreness. There is good range of motion of the fingers but with some soreness. Capillary refill is less than 3 seconds. The radial pulses are 2+ and symmetrical.  Lymphadenopathy:       Head (right side): No submandibular adenopathy present.       Head (left side): No submandibular adenopathy present.    She has no cervical adenopathy.  Neurological: She is alert and oriented to person, place, and time. She has normal strength. No cranial nerve deficit or sensory deficit.  Skin: Skin is warm and dry.  Psychiatric: She has a normal mood and affect. Her speech is normal.    ED Course  Procedures (including critical care time)  Labs Reviewed - No data to display Dg Forearm Right  01/22/2012  *RADIOLOGY REPORT*  Clinical Data: Pain secondary to blunt trauma today.  RIGHT FOREARM - 2 VIEW  Comparison: None.  Findings: The radius and ulna are normal.  No joint effusions.  IMPRESSION: Normal exam.   Original Report Authenticated By: Francene Boyers, M.D.    Dg Hand Complete Right  01/22/2012  *RADIOLOGY REPORT*  Clinical Data: Pain secondary to trauma today.  RIGHT HAND - COMPLETE 3+ VIEW  Comparison: None.  Findings: There is no fracture, dislocation, or other significant abnormality.  IMPRESSION: Normal exam.   Original Report Authenticated By: Francene Boyers, M.D.      No diagnosis found.    MDM  I have reviewed nursing notes, vital signs, and all appropriate lab and imaging results for this patient. The x-ray of the right forearm is negative for fracture or dislocation. The x-ray of the right hand is negative for fracture or dislocation or significant  abnormality. These findings were discussed with the patient. Suggested that she use an ice pack and arm sling. The patient states that she has an arm sling at home. Father suggested the patient use Tylenol Extra Strength 2 tablets every 4 hours as needed for pain. The patient is given the name of the orthopedist on call for evaluation if not improving.       Kathie Dike, [place] 01/22/12 2016

## 2012-01-22 NOTE — ED Notes (Signed)
Van Writer into right arm/hand.  C/o pain to area.

## 2012-01-23 ENCOUNTER — Encounter (HOSPITAL_COMMUNITY): Payer: Self-pay | Admitting: *Deleted

## 2012-01-23 ENCOUNTER — Other Ambulatory Visit (INDEPENDENT_AMBULATORY_CARE_PROVIDER_SITE_OTHER): Payer: Self-pay | Admitting: Internal Medicine

## 2012-01-23 ENCOUNTER — Emergency Department (HOSPITAL_COMMUNITY)
Admission: EM | Admit: 2012-01-23 | Discharge: 2012-01-23 | Disposition: A | Discharge status: Home / Self Care | Payer: BC Managed Care – PPO | Attending: Emergency Medicine | Admitting: Emergency Medicine

## 2012-01-23 DIAGNOSIS — R109 Unspecified abdominal pain: Secondary | ICD-10-CM | POA: Insufficient documentation

## 2012-01-23 DIAGNOSIS — Z79899 Other long term (current) drug therapy: Secondary | ICD-10-CM | POA: Insufficient documentation

## 2012-01-23 DIAGNOSIS — Z9889 Other specified postprocedural states: Secondary | ICD-10-CM | POA: Insufficient documentation

## 2012-01-23 DIAGNOSIS — F17200 Nicotine dependence, unspecified, uncomplicated: Secondary | ICD-10-CM | POA: Insufficient documentation

## 2012-01-23 DIAGNOSIS — R110 Nausea: Secondary | ICD-10-CM | POA: Insufficient documentation

## 2012-01-23 DIAGNOSIS — E876 Hypokalemia: Secondary | ICD-10-CM | POA: Insufficient documentation

## 2012-01-23 DIAGNOSIS — F1910 Other psychoactive substance abuse, uncomplicated: Secondary | ICD-10-CM | POA: Insufficient documentation

## 2012-01-23 DIAGNOSIS — G8929 Other chronic pain: Secondary | ICD-10-CM | POA: Insufficient documentation

## 2012-01-23 DIAGNOSIS — F411 Generalized anxiety disorder: Secondary | ICD-10-CM | POA: Insufficient documentation

## 2012-01-23 MED ORDER — HYDROCODONE-ACETAMINOPHEN 5-500 MG PO TABS: 1.0000 | ORAL_TABLET | Freq: Three times a day (TID) | ORAL | Status: DC | PRN

## 2012-01-23 MED ORDER — HYDROMORPHONE HCL PF 2 MG/ML IJ SOLN
2.0000 mg | Freq: Once | INTRAMUSCULAR | Status: AC
  Administered 2012-01-23: 2 mg via INTRAMUSCULAR
  Filled 2012-01-23: qty 1

## 2012-01-23 MED ORDER — PROMETHAZINE HCL 25 MG/ML IJ SOLN
25.0000 mg | Freq: Once | INTRAMUSCULAR | Status: AC
  Administered 2012-01-23: 25 mg via INTRAMUSCULAR
  Filled 2012-01-23: qty 1

## 2012-01-23 NOTE — ED Provider Notes (Signed)
History     CSN: [id number]  Arrival date & time 01/23/12  1338   First MD Initiated Contact with Patient 01/23/12 1351      Chief Complaint  Patient presents with  . Abdominal Pain    (Consider location/radiation/quality/duration/timing/severity/associated sxs/prior treatment) HPI Comments: Patient with history of chronic abdominal pain.  She presents with another flare starting this morning.  She denies diarrhea or vomiting.  No urinary complaints.  She has seen Dr. Luvenia Starch PA and is scheduled for a colonoscopy at the end of the month.  Patient is a 31 y.o. female presenting with abdominal pain. The history is provided by the patient.  Abdominal Pain The primary symptoms of the illness include abdominal pain and nausea. The primary symptoms of the illness do not include fever, fatigue or vomiting.    Past Medical History  Diagnosis Date  . Hypokalemia   . Narcotic abuse     5 yrs ago lortab, off few yrs  . Anxiety   . Chronic pain     Past Surgical History  Procedure Date  . Tonsillectomy   . Abdominal hysterectomy     left ovary remains  . Cholecystectomy 2010  . Tubal ligation   . Carpal tunnel release     both    Family History  Problem Relation Age of Onset  . Stroke Father   . Diverticulitis Father     History  Substance Use Topics  . Smoking status: Current Every Day Smoker -- 1.5 packs/day for 18 years    Types: Cigarettes  . Smokeless tobacco: Not on file  . Alcohol Use: Yes     Comment: rarely    OB History    Grav Para Term Preterm Abortions TAB SAB Ect Mult Living                  Review of Systems  Constitutional: Negative for fever and fatigue.  Gastrointestinal: Positive for nausea and abdominal pain. Negative for vomiting.  All other systems reviewed and are negative.    Allergies  Fioricet-codeine; Ibuprofen; Ketorolac tromethamine; and Divalproex sodium  Home Medications   Current Outpatient Rx  Name  Route  Sig  Dispense   Refill  . ACETAMINOPHEN 500 MG PO TABS   Oral   Take 500 mg by mouth every 6 (six) hours as needed. For pain         . ALPRAZOLAM 1 MG PO TABS   Oral   Take 1 mg by mouth 4 (four) times daily as needed. Anxiety         . PEG 3350-KCL-NA BICARB-NACL 420 G PO SOLR   Oral   Take 4,000 mLs by mouth as directed.   4000 mL   0     BP 121/61  Pulse 103  Temp 98.2 F (36.8 C) (Oral)  Resp 22  Ht 5\' 1"  (1.549 m)  Wt 123 lb (55.792 kg)  BMI 23.24 kg/m2  SpO2 100%  Physical Exam  Nursing note and vitals reviewed. Constitutional: She is oriented to person, place, and time. She appears well-developed and well-nourished.  HENT:  Head: Normocephalic and atraumatic.  Neck: Normal range of motion. Neck supple.  Cardiovascular: Normal rate and regular rhythm.   No murmur heard. Pulmonary/Chest: Effort normal and breath sounds normal.  Abdominal: Soft.       There is ttp in the right mid abdomen into the suprapubic area.  There is no rebound or guarding.  Musculoskeletal: Normal range of motion.  Neurological: She is alert and oriented to person, place, and time.  Skin: Skin is warm and dry.    ED Course  Procedures (including critical care time)  Labs Reviewed - No data to display Dg Forearm Right  01/22/2012  *RADIOLOGY REPORT*  Clinical Data: Pain secondary to blunt trauma today.  RIGHT FOREARM - 2 VIEW  Comparison: None.  Findings: The radius and ulna are normal.  No joint effusions.  IMPRESSION: Normal exam.   Original Report Authenticated By: Francene Boyers, M.D.    Dg Hand Complete Right  01/22/2012  *RADIOLOGY REPORT*  Clinical Data: Pain secondary to trauma today.  RIGHT HAND - COMPLETE 3+ VIEW  Comparison: None.  Findings: There is no fracture, dislocation, or other significant abnormality.  IMPRESSION: Normal exam.   Original Report Authenticated By: Francene Boyers, M.D.      No diagnosis found.    MDM  The patient presents here with a flare of her chronic abd  pain.  She has been here multiple times and no cause has been found.  I have given an injection of dilaudid for her pain.  I have also informed her that I will not prescribe any further narcotics from the ER as these need to be prescribed by her pcp since this is a chronic issue.        Geoffery Lyons, MD 01/23/12 [phone]

## 2012-01-23 NOTE — ED Notes (Signed)
Low abd pain, NV, crying at triage, says she has had similar episodes in past and told she had "colitis"

## 2012-01-23 NOTE — ED Provider Notes (Signed)
History/physical exam/procedure(s) were performed by non-physician practitioner and as supervising physician I was immediately available for consultation/collaboration. I have reviewed all notes and am in agreement with care and plan.   Amore Grater S Taline Nass, MD 01/23/12 1514 

## 2012-01-24 ENCOUNTER — Telehealth (INDEPENDENT_AMBULATORY_CARE_PROVIDER_SITE_OTHER): Payer: Self-pay | Admitting: Internal Medicine

## 2012-01-24 NOTE — Telephone Encounter (Signed)
In the spring called yesterday complaining of abdominal pain. She was seen in emergency room and discharged. He has colonoscopy scheduled with Dr. Jena Gauss week after next. Patient was called in prescription for Vicodin 5 /500 doses only and advised to contact [facility] on Monday

## 2012-01-25 ENCOUNTER — Emergency Department (HOSPITAL_COMMUNITY): Payer: BC Managed Care – PPO

## 2012-01-25 ENCOUNTER — Encounter (HOSPITAL_COMMUNITY): Payer: Self-pay | Admitting: Emergency Medicine

## 2012-01-25 ENCOUNTER — Emergency Department (HOSPITAL_COMMUNITY)
Admission: EM | Admit: 2012-01-25 | Discharge: 2012-01-25 | Disposition: A | Discharge status: Home / Self Care | Payer: BC Managed Care – PPO | Attending: Emergency Medicine | Admitting: Emergency Medicine

## 2012-01-25 DIAGNOSIS — Z79899 Other long term (current) drug therapy: Secondary | ICD-10-CM | POA: Insufficient documentation

## 2012-01-25 DIAGNOSIS — F411 Generalized anxiety disorder: Secondary | ICD-10-CM | POA: Insufficient documentation

## 2012-01-25 DIAGNOSIS — F1111 Opioid abuse, in remission: Secondary | ICD-10-CM | POA: Insufficient documentation

## 2012-01-25 DIAGNOSIS — Y929 Unspecified place or not applicable: Secondary | ICD-10-CM | POA: Insufficient documentation

## 2012-01-25 DIAGNOSIS — X500XXA Overexertion from strenuous movement or load, initial encounter: Secondary | ICD-10-CM | POA: Insufficient documentation

## 2012-01-25 DIAGNOSIS — Y93F2 Activity, caregiving, lifting: Secondary | ICD-10-CM | POA: Insufficient documentation

## 2012-01-25 DIAGNOSIS — E876 Hypokalemia: Secondary | ICD-10-CM | POA: Insufficient documentation

## 2012-01-25 DIAGNOSIS — R109 Unspecified abdominal pain: Secondary | ICD-10-CM | POA: Insufficient documentation

## 2012-01-25 DIAGNOSIS — F17200 Nicotine dependence, unspecified, uncomplicated: Secondary | ICD-10-CM | POA: Insufficient documentation

## 2012-01-25 LAB — COMPREHENSIVE METABOLIC PANEL
Alkaline Phosphatase: 109 U/L (ref 39–117)
BUN: 7 mg/dL (ref 6–23)
GFR calc Af Amer: >90 mL/min (ref >90)
GFR calc non Af Amer: >90 mL/min (ref >90)
Glucose, Bld: 95 mg/dL (ref 70–99)
Potassium: 3.8 mEq/L (ref 3.5–5.1)
Total Bilirubin: 0.4 mg/dL (ref 0.3–1.2)
Total Protein: 7.1 g/dL (ref 6.0–8.3)

## 2012-01-25 LAB — CBC WITH DIFFERENTIAL/PLATELET
Eosinophils Absolute: 0.3 10*3/uL (ref 0.0–0.7)
Hemoglobin: 13.7 g/dL (ref 12.0–15.0)
Lymphs Abs: 3.1 10*3/uL (ref 0.7–4.0)
MCH: 34.2 pg — ABNORMAL HIGH (ref 26.0–34.0)
MCV: 98.8 fL (ref 78.0–100.0)
Monocytes Relative: 7 % (ref 3–12)
Neutrophils Relative %: 64 % (ref 43–77)
RBC: 4.01 MIL/uL (ref 3.87–5.11)

## 2012-01-25 LAB — URINALYSIS, ROUTINE W REFLEX MICROSCOPIC
Bilirubin Urine: NEGATIVE
Glucose, UA: NEGATIVE mg/dL
Hgb urine dipstick: NEGATIVE
Ketones, ur: NEGATIVE mg/dL
pH: 6 (ref 5.0–8.0)

## 2012-01-25 MED ORDER — LORAZEPAM 2 MG/ML IJ SOLN
1.0000 mg | Freq: Once | INTRAMUSCULAR | Status: AC
  Administered 2012-01-25: 1 mg via INTRAVENOUS
  Filled 2012-01-25: qty 1

## 2012-01-25 MED ORDER — HYDROMORPHONE HCL PF 1 MG/ML IJ SOLN
1.0000 mg | Freq: Once | INTRAMUSCULAR | Status: AC
  Administered 2012-01-25: 1 mg via INTRAVENOUS
  Filled 2012-01-25: qty 1

## 2012-01-25 MED ORDER — HYDROMORPHONE HCL 4 MG PO TABS: 4.0000 mg | ORAL_TABLET | Freq: Four times a day (QID) | ORAL | Status: DC | PRN

## 2012-01-25 MED ORDER — ONDANSETRON HCL 4 MG/2ML IJ SOLN
4.0000 mg | Freq: Once | INTRAMUSCULAR | Status: AC
  Administered 2012-01-25: 4 mg via INTRAVENOUS
  Filled 2012-01-25: qty 2

## 2012-01-25 MED ORDER — SODIUM CHLORIDE 0.9 % IV SOLN
Freq: Once | INTRAVENOUS | Status: AC
  Administered 2012-01-25: 18:00:00 via INTRAVENOUS

## 2012-01-25 MED ORDER — RANITIDINE HCL 150 MG PO CAPS: 150.0000 mg | ORAL_CAPSULE | Freq: Two times a day (BID) | ORAL | Status: DC

## 2012-01-25 MED ORDER — IOHEXOL 300 MG/ML  SOLN
100.0000 mL | Freq: Once | INTRAMUSCULAR | Status: AC | PRN
  Administered 2012-01-25: 100 mL via INTRAVENOUS

## 2012-01-25 NOTE — ED Notes (Signed)
Patient returned from CT

## 2012-01-25 NOTE — ED Notes (Signed)
MD notified of patient's med request.

## 2012-01-25 NOTE — ED Notes (Signed)
Pt c/o abd pain, rates at 8.  Notified Dr. Estell Harpin, medication given.

## 2012-01-25 NOTE — ED Notes (Signed)
Pt c/o abd pain, lower back pain that started after she helped life her father, pt states that she has problems with chronic abd pain but that this pain seems different. N/v X2 after lifting her father due to pain

## 2012-01-25 NOTE — ED Notes (Signed)
CT notified that patient is almost done with her contrast.

## 2012-01-25 NOTE — ED Provider Notes (Signed)
History   This chart was scribed for Angel Lennert, MD, by Frederik Pear, ER scribe. The patient was seen in room [facility]19/[facility] and the patient's care was started at 1654.    CSN: [id number]  Arrival date & time 01/25/12  1645   First MD Initiated Contact with Patient 01/25/12 1654      Chief Complaint  Patient presents with  . Abdominal Pain  . Back Pain    (Consider location/radiation/quality/duration/timing/severity/associated sxs/prior treatment) HPI Comments: [name] is a 31 y.o. female who presents to the Emergency Department complaining of constant, moderate back pain that began earlier today after she was helping to lift her elderly father. She also reports abdominal pain, and she has a h/o of chronic abdominal pain.   PCP is Dr. Sherwood Gambler.  Patient is a 31 y.o. female presenting with back pain.  Back Pain  This is a new problem. The current episode started 6 to 12 hours ago. The problem occurs constantly. The problem has not changed since onset.The pain is associated with lifting heavy objects. The pain is present in the lumbar spine. Pertinent negatives include no chest pain and no headaches.    Past Medical History  Diagnosis Date  . Hypokalemia   . Narcotic abuse     5 yrs ago lortab, off few yrs  . Anxiety   . Chronic pain     Past Surgical History  Procedure Date  . Tonsillectomy   . Abdominal hysterectomy     left ovary remains  . Cholecystectomy 2010  . Tubal ligation   . Carpal tunnel release     both    Family History  Problem Relation Age of Onset  . Stroke Father   . Diverticulitis Father     History  Substance Use Topics  . Smoking status: Current Every Day Smoker -- 1.5 packs/day for 18 years    Types: Cigarettes  . Smokeless tobacco: Not on file  . Alcohol Use: Yes     Comment: rarely    OB History    Grav Para Term Preterm Abortions TAB SAB Ect Mult Living                  Review of Systems  HENT: Negative for congestion,  sinus pressure and ear discharge.   Eyes: Negative for discharge.  Respiratory: Negative for cough.   Cardiovascular: Negative for chest pain.  Skin: Negative for rash.  Neurological: Negative for seizures and headaches.  Hematological: Negative.   Psychiatric/Behavioral: Negative for hallucinations.  All other systems reviewed and are negative.    Allergies  Fioricet-codeine; Ibuprofen; Ketorolac tromethamine; and Divalproex sodium  Home Medications   Current Outpatient Rx  Name  Route  Sig  Dispense  Refill  . ALPRAZOLAM 1 MG PO TABS   Oral   Take 1 mg by mouth 4 (four) times daily as needed. Anxiety         . HYDROCODONE-ACETAMINOPHEN 5-500 MG PO TABS   Oral   Take 1 tablet by mouth every 8 (eight) hours as needed for pain.   10 tablet   0   . PEG 3350-KCL-NA BICARB-NACL 420 G PO SOLR   Oral   Take 4,000 mLs by mouth as directed.   4000 mL   0     BP 103/51  Pulse 97  Temp 98.2 F (36.8 C) (Oral)  Resp 20  Ht 5\' 1"  (1.549 m)  Wt 123 lb (55.792 kg)  BMI 23.24 kg/m2  SpO2 100%  Physical Exam  Nursing note and vitals reviewed. Constitutional: She is oriented to person, place, and time. She appears well-developed.  HENT:  Head: Normocephalic and atraumatic.  Eyes: Conjunctivae normal and EOM are normal. No scleral icterus.  Neck: Neck supple. No thyromegaly present.  Cardiovascular: Normal rate and regular rhythm.  Exam reveals no gallop and no friction rub.   No murmur heard. Pulmonary/Chest: No stridor. She has no wheezes. She has no rales. She exhibits no tenderness.  Abdominal: She exhibits no distension. There is no tenderness. There is no rebound.       She has tenderness throughout her abdomen.  Musculoskeletal: Normal range of motion. She exhibits tenderness. She exhibits no edema.       She had moderate tenderness in her lumbar spine.  Lymphadenopathy:    She has no cervical adenopathy.  Neurological: She is oriented to person, place, and time.  Coordination normal.  Skin: No rash noted. No erythema.  Psychiatric: She has a normal mood and affect. Her behavior is normal.    ED Course  Procedures (including critical care time)  DIAGNOSTIC STUDIES: Oxygen Saturation is 100% on room air, normal by my interpretation.    COORDINATION OF CARE:  17:01- Discussed planned course of treatment with the patient,  Including an X-ray of the lumbar spine and pain and nausea medication, who is agreeable at this time.  17:15- Medication Orders- hydromorphone (DILAUDID) injection 1 mg-Once, ondansetron (ZOFRAN) injection 4 mg-Once.  18:00- Medication Orders- Hydromorphone (DILAUDID) injection 1 mg-Once.  18:07- Recheck- Pt reports that she is still in pain. Discussed negative X-ray finding. Suggested following up with a GI specialist or PCP.  18:15- Lorazepam (ATIVAN) injection 1 mg-Once  18:30- 0.9% sodium chloride infusion- Once.  19:45- Hydromorphone (DILAUDID) injection 1 mg-Once.  Results for orders placed during the hospital encounter of 01/25/12  URINALYSIS, ROUTINE W REFLEX MICROSCOPIC      Component Value Range   Color, Urine YELLOW  YELLOW   APPearance CLEAR  CLEAR   Specific Gravity, Urine 1.010  1.005 - 1.030   pH 6.0  5.0 - 8.0   Glucose, UA NEGATIVE  NEGATIVE mg/dL   Hgb urine dipstick NEGATIVE  NEGATIVE   Bilirubin Urine NEGATIVE  NEGATIVE   Ketones, ur NEGATIVE  NEGATIVE mg/dL   Protein, ur NEGATIVE  NEGATIVE mg/dL   Urobilinogen, UA 0.2  0.0 - 1.0 mg/dL   Nitrite NEGATIVE  NEGATIVE   Leukocytes, UA NEGATIVE  NEGATIVE  PREGNANCY, URINE      Component Value Range   Preg Test, Ur NEGATIVE  NEGATIVE  CBC WITH DIFFERENTIAL      Component Value Range   WBC 11.9 (*) 4.0 - 10.5 K/uL   RBC 4.01  3.87 - 5.11 MIL/uL   Hemoglobin 13.7  12.0 - 15.0 g/dL   HCT 40.9  81.1 - 91.4 %   MCV 98.8  78.0 - 100.0 fL   MCH 34.2 (*) 26.0 - 34.0 pg   MCHC 34.6  30.0 - 36.0 g/dL   RDW 78.2  95.6 - 21.3 %   Platelets 376  150 - 400  K/uL   Neutrophils Relative 64  43 - 77 %   Neutro Abs 7.6  1.7 - 7.7 K/uL   Lymphocytes Relative 26  12 - 46 %   Lymphs Abs 3.1  0.7 - 4.0 K/uL   Monocytes Relative 7  3 - 12 %   Monocytes Absolute 0.8  0.1 - 1.0 K/uL  Eosinophils Relative 3  0 - 5 %   Eosinophils Absolute 0.3  0.0 - 0.7 K/uL   Basophils Relative 0  0 - 1 %   Basophils Absolute 0.1  0.0 - 0.1 K/uL  COMPREHENSIVE METABOLIC PANEL      Component Value Range   Sodium 140  135 - 145 mEq/L   Potassium 3.8  3.5 - 5.1 mEq/L   Chloride 101  96 - 112 mEq/L   CO2 28  19 - 32 mEq/L   Glucose, Bld 95  70 - 99 mg/dL   BUN 7  6 - 23 mg/dL   Creatinine, Ser 1.61  0.50 - 1.10 mg/dL   Calcium 9.1  8.4 - 09.6 mg/dL   Total Protein 7.1  6.0 - 8.3 g/dL   Albumin 4.2  3.5 - 5.2 g/dL   AST 23  0 - 37 U/L   ALT 25  0 - 35 U/L   Alkaline Phosphatase 109  39 - 117 U/L   Total Bilirubin 0.4  0.3 - 1.2 mg/dL   GFR calc non Af Amer >90  >90 mL/min   GFR calc Af Amer >90  >90 mL/min    Labs Reviewed  CBC WITH DIFFERENTIAL - Abnormal; Notable for the following:    WBC 11.9 (*)     MCH 34.2 (*)     All other components within normal limits  URINALYSIS, ROUTINE W REFLEX MICROSCOPIC  PREGNANCY, URINE  COMPREHENSIVE METABOLIC PANEL   Dg Lumbar Spine Complete  01/25/2012  *RADIOLOGY REPORT*  Clinical Data: Lumbar spine pain.  Lifting injury today.  LUMBAR SPINE - COMPLETE 4+ VIEW  Comparison: None.  Findings: Five lumbar type vertebral bodies.  No spondylolisthesis. Mild levoconvex curvature may be positional or secondary to spasm. Cholecystectomy clips.  Clips in the right anatomic pelvis, possibly for tubal ligation.  No pars defects.  Lumbosacral junction appears normal.  IMPRESSION: No acute osseous abnormality.  Mild levoconvex curvature may be positional or secondary to spasm.   Original Report Authenticated By: Andreas Newport, M.D.      No diagnosis found.    MDM  The chart was scribed for me under my direct supervision.  I  personally performed the history, physical, and medical decision making and all procedures in the evaluation of this patient.Angel Lennert, MD 01/25/12 2013

## 2012-01-25 NOTE — ED Notes (Signed)
Patient has completed one bottle of PO ct contrast.

## 2012-01-25 NOTE — ED Notes (Addendum)
Pt sitting in bed, texting on cell phone, states that she is feeling better and that she is not able to give urine sample at present time,

## 2012-01-25 NOTE — ED Notes (Signed)
Pt c/o abd/back pain with n/v. Pt states she has vomited twice today.

## 2012-01-25 NOTE — ED Notes (Signed)
Request something else for pain

## 2012-01-25 NOTE — ED Notes (Signed)
Patient to CT.

## 2012-01-26 ENCOUNTER — Encounter (HOSPITAL_COMMUNITY): Payer: Self-pay | Admitting: Pharmacy Technician

## 2012-01-30 ENCOUNTER — Telehealth: Payer: Self-pay | Admitting: *Deleted

## 2012-01-30 NOTE — Telephone Encounter (Signed)
[name] called today. She is still having a lot of pain and has been to the ER, and they cant do anything for her. She called her PCP, Dr Sherwood Gambler, and he has referred her back to our office. Please follow up. Thanks.

## 2012-01-30 NOTE — Telephone Encounter (Signed)
Advise pt to keep procedure as planned. Recent CT scan was normal. Trial Levsin 0.125mg  before meals/bedtime (QID) prn abd pain, #60, 0RF Be sure no hx glaucoma. Thanks

## 2012-01-30 NOTE — Telephone Encounter (Signed)
Pt aware of new Rx send to drug store. I told her that if that did not help to call the hospital and have them page the doctor on call.

## 2012-01-30 NOTE — Telephone Encounter (Signed)
Spoke with pt- she is having lower abd pain with sharp pain on her R side that radiates to her back. Pt also has Nausea and vomiting. Last BM this morning and it was normal. No fever, no diarrhea. Pt has been to the ED x2 since last seen here, she stated they gave her dilaudid at the ED and she couldn't keep it down. She is scheduled for tcs on 02/03/12. Pt wants to know what to do.  Please advise.

## 2012-01-31 ENCOUNTER — Emergency Department (HOSPITAL_COMMUNITY): Payer: BC Managed Care – PPO

## 2012-01-31 ENCOUNTER — Encounter (HOSPITAL_COMMUNITY): Payer: Self-pay | Admitting: *Deleted

## 2012-01-31 ENCOUNTER — Emergency Department (HOSPITAL_COMMUNITY)
Admission: EM | Admit: 2012-01-31 | Discharge: 2012-01-31 | Disposition: A | Discharge status: Home / Self Care | Payer: BC Managed Care – PPO | Attending: Emergency Medicine | Admitting: Emergency Medicine

## 2012-01-31 DIAGNOSIS — F411 Generalized anxiety disorder: Secondary | ICD-10-CM | POA: Insufficient documentation

## 2012-01-31 DIAGNOSIS — Z79899 Other long term (current) drug therapy: Secondary | ICD-10-CM | POA: Insufficient documentation

## 2012-01-31 DIAGNOSIS — G8929 Other chronic pain: Secondary | ICD-10-CM | POA: Insufficient documentation

## 2012-01-31 DIAGNOSIS — F1110 Opioid abuse, uncomplicated: Secondary | ICD-10-CM | POA: Insufficient documentation

## 2012-01-31 DIAGNOSIS — R1110 Vomiting, unspecified: Secondary | ICD-10-CM | POA: Insufficient documentation

## 2012-01-31 DIAGNOSIS — R109 Unspecified abdominal pain: Secondary | ICD-10-CM | POA: Insufficient documentation

## 2012-01-31 LAB — URINALYSIS, ROUTINE W REFLEX MICROSCOPIC
Glucose, UA: NEGATIVE mg/dL
Hgb urine dipstick: NEGATIVE
Ketones, ur: NEGATIVE mg/dL
Protein, ur: NEGATIVE mg/dL
pH: 6 (ref 5.0–8.0)

## 2012-01-31 LAB — COMPREHENSIVE METABOLIC PANEL
ALT: 32 U/L (ref 0–35)
AST: 34 U/L (ref 0–37)
Albumin: 4.1 g/dL (ref 3.5–5.2)
CO2: 25 mEq/L (ref 19–32)
Chloride: 105 mEq/L (ref 96–112)
GFR calc non Af Amer: >90 mL/min (ref >90)
Potassium: 3.8 mEq/L (ref 3.5–5.1)
Sodium: 139 mEq/L (ref 135–145)
Total Bilirubin: 0.3 mg/dL (ref 0.3–1.2)

## 2012-01-31 LAB — CBC WITH DIFFERENTIAL/PLATELET
Basophils Absolute: 0.1 10*3/uL (ref 0.0–0.1)
HCT: 38.3 % (ref 36.0–46.0)
Lymphocytes Relative: 35 % (ref 12–46)
Neutro Abs: 4.3 10*3/uL (ref 1.7–7.7)
Neutrophils Relative %: 56 % (ref 43–77)
Platelets: 327 10*3/uL (ref 150–400)
RDW: 12 % (ref 11.5–15.5)
WBC: 7.7 10*3/uL (ref 4.0–10.5)

## 2012-01-31 MED ORDER — HYDROMORPHONE HCL PF 1 MG/ML IJ SOLN
1.0000 mg | Freq: Once | INTRAMUSCULAR | Status: AC
  Administered 2012-01-31: 1 mg via INTRAVENOUS

## 2012-01-31 MED ORDER — HYDROMORPHONE HCL 4 MG PO TABS: 4.0000 mg | ORAL_TABLET | Freq: Four times a day (QID) | ORAL | Status: DC | PRN

## 2012-01-31 MED ORDER — HYDROMORPHONE HCL PF 1 MG/ML IJ SOLN
INTRAMUSCULAR | Status: AC
  Filled 2012-01-31: qty 1

## 2012-01-31 MED ORDER — ONDANSETRON HCL 4 MG/2ML IJ SOLN
4.0000 mg | Freq: Once | INTRAMUSCULAR | Status: AC
  Administered 2012-01-31: 4 mg via INTRAVENOUS

## 2012-01-31 MED ORDER — PROMETHAZINE HCL 25 MG PO TABS: 25.0000 mg | ORAL_TABLET | Freq: Four times a day (QID) | ORAL | Status: DC | PRN

## 2012-01-31 MED ORDER — ONDANSETRON HCL 4 MG/2ML IJ SOLN
INTRAMUSCULAR | Status: AC
  Filled 2012-01-31: qty 2

## 2012-01-31 MED ORDER — LORAZEPAM 2 MG/ML IJ SOLN
INTRAMUSCULAR | Status: AC
  Filled 2012-01-31: qty 1

## 2012-01-31 MED ORDER — LORAZEPAM 2 MG/ML IJ SOLN
1.0000 mg | Freq: Once | INTRAMUSCULAR | Status: AC
  Administered 2012-01-31: 1 mg via INTRAVENOUS

## 2012-01-31 MED ORDER — SODIUM CHLORIDE 0.9 % IV BOLUS (SEPSIS)
1000.0000 mL | Freq: Once | INTRAVENOUS | Status: AC
  Administered 2012-01-31: 1000 mL via INTRAVENOUS

## 2012-01-31 NOTE — ED Notes (Signed)
Reports chronic abd pain - states it's periumbilical that radiates downward.  Describes as sharp.  Reports n/v with pain.

## 2012-01-31 NOTE — ED Notes (Signed)
Patient with no complaints at this time. Respirations even and unlabored. Skin warm/dry. Discharge instructions reviewed with patient at this time. Patient given opportunity to voice concerns/ask questions. Patient discharged at this time and left Emergency Department with steady gait.   

## 2012-01-31 NOTE — ED Provider Notes (Signed)
History     CSN: [id number]  Arrival date & time 01/31/12  1322   First MD Initiated Contact with Patient 01/31/12 1333      Chief Complaint  Patient presents with  . Abdominal Pain  . Emesis    (Consider location/radiation/quality/duration/timing/severity/associated sxs/prior treatment) Patient is a 31 y.o. female presenting with abdominal pain and vomiting. The history is provided by the patient (the pt complains of nauseu and abd pain). No language interpreter was used.  Abdominal Pain The primary symptoms of the illness include abdominal pain and vomiting. The primary symptoms of the illness do not include fatigue or diarrhea. The current episode started 13 to 24 hours ago. The onset of the illness was gradual. The problem has not changed since onset. Associated with: nothing. The patient states that she believes she is currently not pregnant. The patient has not had a change in bowel habit. Symptoms associated with the illness do not include chills, hematuria, frequency or back pain.  Emesis  Associated symptoms include abdominal pain. Pertinent negatives include no chills, no cough, no diarrhea and no headaches.    Past Medical History  Diagnosis Date  . Hypokalemia   . Narcotic abuse     5 yrs ago lortab, off few yrs  . Anxiety   . Chronic pain     Past Surgical History  Procedure Date  . Tonsillectomy   . Abdominal hysterectomy     left ovary remains  . Cholecystectomy 2010  . Tubal ligation   . Carpal tunnel release     both    Family History  Problem Relation Age of Onset  . Stroke Father   . Diverticulitis Father     History  Substance Use Topics  . Smoking status: Current Every Day Smoker -- 1.5 packs/day for 18 years    Types: Cigarettes  . Smokeless tobacco: Not on file  . Alcohol Use: Yes     Comment: rarely    OB History    Grav Para Term Preterm Abortions TAB SAB Ect Mult Living                  Review of Systems  Constitutional:  Negative for chills and fatigue.  HENT: Negative for congestion, sinus pressure and ear discharge.   Eyes: Negative for discharge.  Respiratory: Negative for cough.   Cardiovascular: Negative for chest pain.  Gastrointestinal: Positive for vomiting and abdominal pain. Negative for diarrhea.  Genitourinary: Negative for frequency and hematuria.  Musculoskeletal: Negative for back pain.  Skin: Negative for rash.  Neurological: Negative for seizures and headaches.  Hematological: Negative.   Psychiatric/Behavioral: Negative for hallucinations.    Allergies  Fioricet-codeine; Ibuprofen; Ketorolac tromethamine; and Divalproex sodium  Home Medications   Current Outpatient Rx  Name  Route  Sig  Dispense  Refill  . ALPRAZOLAM 1 MG PO TABS   Oral   Take 1 mg by mouth 4 (four) times daily as needed. Anxiety         . HYDROMORPHONE HCL 4 MG PO TABS   Oral   Take 1 tablet (4 mg total) by mouth every 6 (six) hours as needed for pain.   30 tablet   0   . RANITIDINE HCL 150 MG PO CAPS   Oral   Take 1 capsule (150 mg total) by mouth 2 (two) times daily.   20 capsule   0   . HYDROMORPHONE HCL 4 MG PO TABS   Oral   Take 1  tablet (4 mg total) by mouth every 6 (six) hours as needed for pain.   20 tablet   0   . PEG 3350-KCL-NA BICARB-NACL 420 G PO SOLR   Oral   Take 4,000 mLs by mouth as directed.   4000 mL   0   . PROMETHAZINE HCL 25 MG PO TABS   Oral   Take 1 tablet (25 mg total) by mouth every 6 (six) hours as needed for nausea.   20 tablet   0     BP 108/74  Pulse 77  Temp 98.7 F (37.1 C) (Oral)  Resp 20  Ht 5\' 1"  (1.549 m)  Wt 123 lb (55.792 kg)  BMI 23.24 kg/m2  SpO2 98%  Physical Exam  Constitutional: She is oriented to person, place, and time. She appears well-developed.  HENT:  Head: Normocephalic and atraumatic.  Eyes: Conjunctivae normal and EOM are normal. No scleral icterus.  Neck: Neck supple. No thyromegaly present.  Cardiovascular: Normal rate  and regular rhythm.  Exam reveals no gallop and no friction rub.   No murmur heard. Pulmonary/Chest: No stridor. She has no wheezes. She has no rales. She exhibits no tenderness.  Abdominal: She exhibits no distension. There is tenderness. There is no rebound.  Musculoskeletal: Normal range of motion. She exhibits no edema.  Lymphadenopathy:    She has no cervical adenopathy.  Neurological: She is oriented to person, place, and time. Coordination normal.  Skin: No rash noted. No erythema.  Psychiatric: She has a normal mood and affect. Her behavior is normal.    ED Course  Procedures (including critical care time)  Labs Reviewed  CBC WITH DIFFERENTIAL - Abnormal; Notable for the following:    RBC 3.80 (*)     MCV 100.8 (*)     MCH 35.0 (*)     All other components within normal limits  COMPREHENSIVE METABOLIC PANEL - Abnormal; Notable for the following:    BUN 5 (*)     All other components within normal limits  URINALYSIS, ROUTINE W REFLEX MICROSCOPIC - Abnormal; Notable for the following:    Specific Gravity, Urine <1.005 (*)     All other components within normal limits  LIPASE, BLOOD   Dg Abd Acute W/chest  01/31/2012  *RADIOLOGY REPORT*  Clinical Data: Right abdominal pain  ACUTE ABDOMEN SERIES (ABDOMEN 2 VIEW & CHEST 1 VIEW)  Comparison: 12/18/2010, 01/25/2012  Findings: Normal heart size and vascularity.  Lungs clear.  No effusion or pneumothorax.  Trachea midline.  Prior cholecystectomy noted.  Residual contrast from a recent CT noted in the colon.  Negative for obstruction or dilatation.  Prior appendectomy noted.  No osseous abnormality or abnormal calcification.  IMPRESSION: No acute finding.  Residual colonic contrast from a prior imaging study.   Original Report Authenticated By: Judie Petit. Shick, M.D.      1. Abdominal pain     Chronic abd pain  MDM          [name], MD 01/31/12 1440

## 2012-02-03 ENCOUNTER — Ambulatory Visit (HOSPITAL_COMMUNITY)
Admission: RE | Admit: 2012-02-03 | Payer: BC Managed Care – PPO | Source: Ambulatory Visit | Admitting: Internal Medicine

## 2012-02-03 ENCOUNTER — Telehealth: Payer: Self-pay | Admitting: Internal Medicine

## 2012-02-03 ENCOUNTER — Encounter (HOSPITAL_COMMUNITY): Admission: RE | Payer: Self-pay | Source: Ambulatory Visit

## 2012-02-03 SURGERY — COLONOSCOPY
Anesthesia: Moderate Sedation

## 2012-02-03 NOTE — Telephone Encounter (Signed)
Patient was a N/S for her procedure today and when Melanie from [facility] called her to find out why she wasn't there someone said that she was gone to take her mother to social services We will wait for her to return our call to R/S procedure

## 2012-02-19 ENCOUNTER — Emergency Department (HOSPITAL_COMMUNITY)
Admission: EM | Admit: 2012-02-19 | Discharge: 2012-02-19 | Disposition: A | Discharge status: Home / Self Care | Payer: BC Managed Care – PPO | Attending: Emergency Medicine | Admitting: Emergency Medicine

## 2012-02-19 ENCOUNTER — Encounter (HOSPITAL_COMMUNITY): Payer: Self-pay | Admitting: Emergency Medicine

## 2012-02-19 DIAGNOSIS — G8929 Other chronic pain: Secondary | ICD-10-CM | POA: Insufficient documentation

## 2012-02-19 DIAGNOSIS — F17200 Nicotine dependence, unspecified, uncomplicated: Secondary | ICD-10-CM | POA: Insufficient documentation

## 2012-02-19 DIAGNOSIS — F1110 Opioid abuse, uncomplicated: Secondary | ICD-10-CM | POA: Insufficient documentation

## 2012-02-19 DIAGNOSIS — F411 Generalized anxiety disorder: Secondary | ICD-10-CM | POA: Insufficient documentation

## 2012-02-19 DIAGNOSIS — Z8639 Personal history of other endocrine, nutritional and metabolic disease: Secondary | ICD-10-CM | POA: Insufficient documentation

## 2012-02-19 DIAGNOSIS — Z862 Personal history of diseases of the blood and blood-forming organs and certain disorders involving the immune mechanism: Secondary | ICD-10-CM | POA: Insufficient documentation

## 2012-02-19 DIAGNOSIS — Z79899 Other long term (current) drug therapy: Secondary | ICD-10-CM | POA: Insufficient documentation

## 2012-02-19 DIAGNOSIS — R1084 Generalized abdominal pain: Secondary | ICD-10-CM | POA: Insufficient documentation

## 2012-02-19 MED ORDER — PROMETHAZINE HCL 25 MG/ML IJ SOLN
25.0000 mg | Freq: Four times a day (QID) | INTRAMUSCULAR | Status: DC | PRN
  Administered 2012-02-19: 25 mg via INTRAMUSCULAR
  Filled 2012-02-19 (×2): qty 1

## 2012-02-19 MED ORDER — HYDROMORPHONE HCL PF 2 MG/ML IJ SOLN
2.0000 mg | Freq: Once | INTRAMUSCULAR | Status: AC
  Administered 2012-02-19: 2 mg via INTRAMUSCULAR
  Filled 2012-02-19: qty 1

## 2012-02-19 NOTE — ED Notes (Signed)
Pt c/o abd pain with n x 3 days.

## 2012-02-19 NOTE — ED Provider Notes (Signed)
History     CSN: [id number]  Arrival date & time 02/19/12  1625   First MD Initiated Contact with Patient 02/19/12 1703      Chief Complaint  Patient presents with  . Abdominal Pain    (Consider location/radiation/quality/duration/timing/severity/associated sxs/prior treatment) HPI Pt with long history of chronic abdominal pain with numerous ED visits for same has had normal workup despite extensive evaluation including labs, [place] and CT. She has been referred to GI but missed appointment for colonoscopy 2 weeks ago. Waiting to reschedule. She reports 2-3 days of moderate to severe cramping periumbilical and L lower abdominal pain, associated with nausea, but no vomiting diarrhea or fever. This is typical pain for her.   Past Medical History  Diagnosis Date  . Hypokalemia   . Narcotic abuse     5 yrs ago lortab, off few yrs  . Anxiety   . Chronic pain     Past Surgical History  Procedure Date  . Tonsillectomy   . Abdominal hysterectomy     left ovary remains  . Cholecystectomy 2010  . Tubal ligation   . Carpal tunnel release     both    Family History  Problem Relation Age of Onset  . Stroke Father   . Diverticulitis Father     History  Substance Use Topics  . Smoking status: Current Every Day Smoker -- 1.5 packs/day for 18 years    Types: Cigarettes  . Smokeless tobacco: Not on file  . Alcohol Use: Yes     Comment: rarely    OB History    Grav Para Term Preterm Abortions TAB SAB Ect Mult Living                  Review of Systems All other systems reviewed and are negative except as noted in HPI.   Allergies  Fioricet-codeine; Ibuprofen; Ketorolac tromethamine; and Divalproex sodium  Home Medications   Current Outpatient Rx  Name  Route  Sig  Dispense  Refill  . ALPRAZOLAM 1 MG PO TABS   Oral   Take 1 mg by mouth 4 (four) times daily as needed. Anxiety         . HYDROMORPHONE HCL 4 MG PO TABS   Oral   Take 1 tablet (4 mg total) by mouth  every 6 (six) hours as needed for pain.   30 tablet   0   . HYDROMORPHONE HCL 4 MG PO TABS   Oral   Take 1 tablet (4 mg total) by mouth every 6 (six) hours as needed for pain.   20 tablet   0   . PEG 3350-KCL-NA BICARB-NACL 420 G PO SOLR   Oral   Take 4,000 mLs by mouth as directed.   4000 mL   0   . PROMETHAZINE HCL 25 MG PO TABS   Oral   Take 1 tablet (25 mg total) by mouth every 6 (six) hours as needed for nausea.   20 tablet   0   . RANITIDINE HCL 150 MG PO CAPS   Oral   Take 1 capsule (150 mg total) by mouth 2 (two) times daily.   20 capsule   0     BP 101/55  Pulse 74  Temp 98.8 F (37.1 C)  Resp 18  Ht 5\' 1"  (1.549 m)  Wt 115 lb (52.164 kg)  BMI 21.73 kg/m2  SpO2 100%  Physical Exam  Nursing note and vitals reviewed. Constitutional: She is oriented to  person, place, and time. She appears well-developed and well-nourished.  HENT:  Head: Normocephalic and atraumatic.  Eyes: EOM are normal. Pupils are equal, round, and reactive to light.  Neck: Normal range of motion. Neck supple.  Cardiovascular: Normal rate, normal heart sounds and intact distal pulses.   Pulmonary/Chest: Effort normal and breath sounds normal.  Abdominal: Bowel sounds are normal. She exhibits no distension. There is tenderness (diffuse tenderness). There is no rebound and no guarding.  Musculoskeletal: Normal range of motion. She exhibits no edema and no tenderness.  Neurological: She is alert and oriented to person, place, and time. She has normal strength. No cranial nerve deficit or sensory deficit.  Skin: Skin is warm and dry. No rash noted.  Psychiatric: She has a normal mood and affect.    ED Course  Procedures (including critical care time)  Labs Reviewed - No data to display No results found.   No diagnosis found.    MDM  Abdomen benign. No need for repeated ED studies at this time. Offered IM pain and nausea meds in the ED, but advised that no additional Rx for  narcotics would be given in the ED for her chronic pain. Encouraged to see PCP and GI for long term control of her symptoms.         Charles B. Bernette Mayers, MD 02/19/12 1714

## 2012-02-21 ENCOUNTER — Encounter (HOSPITAL_COMMUNITY): Payer: Self-pay

## 2012-02-21 ENCOUNTER — Encounter (HOSPITAL_COMMUNITY): Payer: Self-pay | Admitting: Emergency Medicine

## 2012-02-21 ENCOUNTER — Emergency Department (HOSPITAL_COMMUNITY)
Admission: EM | Admit: 2012-02-21 | Discharge: 2012-02-22 | Disposition: A | Discharge status: Home / Self Care | Payer: BC Managed Care – PPO | Attending: Emergency Medicine | Admitting: Emergency Medicine

## 2012-02-21 ENCOUNTER — Emergency Department (HOSPITAL_COMMUNITY)
Admission: EM | Admit: 2012-02-21 | Discharge: 2012-02-21 | Disposition: A | Discharge status: Home / Self Care | Payer: BC Managed Care – PPO | Attending: Emergency Medicine | Admitting: Emergency Medicine

## 2012-02-21 ENCOUNTER — Encounter (HOSPITAL_COMMUNITY): Payer: Self-pay | Admitting: *Deleted

## 2012-02-21 ENCOUNTER — Inpatient Hospital Stay (HOSPITAL_COMMUNITY)
Admission: AD | Admit: 2012-02-21 | Discharge: 2012-02-21 | Disposition: A | Discharge status: Home / Self Care | Payer: BC Managed Care – PPO | Source: Ambulatory Visit | Attending: Obstetrics and Gynecology | Admitting: Obstetrics and Gynecology

## 2012-02-21 DIAGNOSIS — Z8742 Personal history of other diseases of the female genital tract: Secondary | ICD-10-CM | POA: Insufficient documentation

## 2012-02-21 DIAGNOSIS — Z8639 Personal history of other endocrine, nutritional and metabolic disease: Secondary | ICD-10-CM | POA: Insufficient documentation

## 2012-02-21 DIAGNOSIS — Z79899 Other long term (current) drug therapy: Secondary | ICD-10-CM | POA: Insufficient documentation

## 2012-02-21 DIAGNOSIS — G8929 Other chronic pain: Secondary | ICD-10-CM

## 2012-02-21 DIAGNOSIS — R109 Unspecified abdominal pain: Secondary | ICD-10-CM | POA: Insufficient documentation

## 2012-02-21 DIAGNOSIS — Z862 Personal history of diseases of the blood and blood-forming organs and certain disorders involving the immune mechanism: Secondary | ICD-10-CM | POA: Insufficient documentation

## 2012-02-21 DIAGNOSIS — F17200 Nicotine dependence, unspecified, uncomplicated: Secondary | ICD-10-CM | POA: Insufficient documentation

## 2012-02-21 DIAGNOSIS — R110 Nausea: Secondary | ICD-10-CM | POA: Insufficient documentation

## 2012-02-21 DIAGNOSIS — F1110 Opioid abuse, uncomplicated: Secondary | ICD-10-CM | POA: Insufficient documentation

## 2012-02-21 DIAGNOSIS — E876 Hypokalemia: Secondary | ICD-10-CM | POA: Insufficient documentation

## 2012-02-21 DIAGNOSIS — F411 Generalized anxiety disorder: Secondary | ICD-10-CM | POA: Insufficient documentation

## 2012-02-21 DIAGNOSIS — Z90710 Acquired absence of both cervix and uterus: Secondary | ICD-10-CM | POA: Insufficient documentation

## 2012-02-21 DIAGNOSIS — N949 Unspecified condition associated with female genital organs and menstrual cycle: Secondary | ICD-10-CM

## 2012-02-21 DIAGNOSIS — Z8659 Personal history of other mental and behavioral disorders: Secondary | ICD-10-CM | POA: Insufficient documentation

## 2012-02-21 DIAGNOSIS — R1084 Generalized abdominal pain: Secondary | ICD-10-CM | POA: Insufficient documentation

## 2012-02-21 DIAGNOSIS — R350 Frequency of micturition: Secondary | ICD-10-CM | POA: Insufficient documentation

## 2012-02-21 HISTORY — DX: Endometriosis, unspecified: N80.9

## 2012-02-21 LAB — CBC WITH DIFFERENTIAL/PLATELET
Basophils Absolute: 0.1 10*3/uL (ref 0.0–0.1)
Basophils Relative: 1 % (ref 0–1)
Eosinophils Absolute: 0.6 10*3/uL (ref 0.0–0.7)
HCT: 42.1 % (ref 36.0–46.0)
MCH: 34.9 pg — ABNORMAL HIGH (ref 26.0–34.0)
MCHC: 34.2 g/dL (ref 30.0–36.0)
Monocytes Absolute: 0.7 10*3/uL (ref 0.1–1.0)
Neutro Abs: 4.3 10*3/uL (ref 1.7–7.7)
Neutrophils Relative %: 50 % (ref 43–77)
RDW: 12.2 % (ref 11.5–15.5)

## 2012-02-21 LAB — RAPID URINE DRUG SCREEN, HOSP PERFORMED
Amphetamines: NOT DETECTED
Benzodiazepines: POSITIVE — AB
Cocaine: NOT DETECTED
Opiates: NOT DETECTED
Tetrahydrocannabinol: NOT DETECTED

## 2012-02-21 LAB — COMPREHENSIVE METABOLIC PANEL
AST: 31 U/L (ref 0–37)
Albumin: 4.2 g/dL (ref 3.5–5.2)
BUN: 11 mg/dL (ref 6–23)
Chloride: 101 mEq/L (ref 96–112)
Creatinine, Ser: 0.7 mg/dL (ref 0.50–1.10)
Total Bilirubin: 0.2 mg/dL — ABNORMAL LOW (ref 0.3–1.2)
Total Protein: 7.7 g/dL (ref 6.0–8.3)

## 2012-02-21 LAB — URINALYSIS, ROUTINE W REFLEX MICROSCOPIC
Glucose, UA: NEGATIVE mg/dL
Hgb urine dipstick: NEGATIVE
Leukocytes, UA: NEGATIVE
Protein, ur: NEGATIVE mg/dL
Specific Gravity, Urine: 1.02 (ref 1.005–1.030)
pH: 6 (ref 5.0–8.0)

## 2012-02-21 LAB — WET PREP, GENITAL
Clue Cells Wet Prep HPF POC: NONE SEEN
Trich, Wet Prep: NONE SEEN
Yeast Wet Prep HPF POC: NONE SEEN

## 2012-02-21 MED ORDER — DIPHENHYDRAMINE HCL 50 MG/ML IJ SOLN
25.0000 mg | Freq: Once | INTRAMUSCULAR | Status: AC
  Administered 2012-02-21: 25 mg via INTRAMUSCULAR
  Filled 2012-02-21: qty 1

## 2012-02-21 MED ORDER — HYDROMORPHONE HCL PF 1 MG/ML IJ SOLN
1.0000 mg | Freq: Once | INTRAMUSCULAR | Status: AC
  Administered 2012-02-21: 1 mg via INTRAMUSCULAR
  Filled 2012-02-21: qty 1

## 2012-02-21 MED ORDER — HYDROMORPHONE HCL PF 2 MG/ML IJ SOLN
2.0000 mg | Freq: Once | INTRAMUSCULAR | Status: AC
  Administered 2012-02-21: 2 mg via INTRAMUSCULAR
  Filled 2012-02-21: qty 1

## 2012-02-21 MED ORDER — ONDANSETRON 4 MG PO TBDP
4.0000 mg | ORAL_TABLET | Freq: Once | ORAL | Status: AC
  Administered 2012-02-21: 4 mg via ORAL
  Filled 2012-02-21: qty 1

## 2012-02-21 MED ORDER — ACETAMINOPHEN 500 MG PO TABS
1000.0000 mg | ORAL_TABLET | Freq: Once | ORAL | Status: AC
  Administered 2012-02-21: 1000 mg via ORAL
  Filled 2012-02-21: qty 2

## 2012-02-21 NOTE — ED Provider Notes (Signed)
History     CSN: [id number]  Arrival date & time 02/21/12  0827   First MD Initiated Contact with Patient 02/21/12 [phone]      Chief Complaint  Patient presents with  . Abdominal Pain    (Consider location/radiation/quality/duration/timing/severity/associated sxs/prior treatment) HPI Comments: Pt has been having diffuse abdominal pain ~ 3-4 months.  Worse in past few weeks.  No fever or chills.  Seen here ~ 3 weeks ago and referred to her PCP and dr. Karilyn Cota for colonoscopy but reportedly was a no show.  She has not re-scheduled.  She has not seen her PCP.  She was seen here 2 days ago for same.  She was here ~ 5 hrs ago with same complaint but left without notifying anyone because "i had to take my kids to school".  Labs including CBC, bmet and urinalysis were all essentially normal.  CT of abd and pelvis from ~ 01-25-12 were normal.  U/s of pelvis was normal.  S/p cholecystectomy, appendectomy and partial hysterectomy (L ovary remains).  Patient is a 31 y.o. female presenting with abdominal pain. The history is provided by the patient. No language interpreter was used.  Abdominal Pain The primary symptoms of the illness include abdominal pain and nausea. The primary symptoms of the illness do not include fever, vomiting, diarrhea, hematochezia, dysuria or vaginal discharge.  The patient states that she believes she is currently not pregnant. The patient has not had a change in bowel habit. Risk factors for an acute abdominal problem include a history of abdominal surgery. Additional symptoms associated with the illness include frequency. Symptoms associated with the illness do not include chills, anorexia, diaphoresis, constipation, urgency, hematuria or back pain. Significant associated medical issues do not include diverticulitis.    Past Medical History  Diagnosis Date  . Hypokalemia   . Narcotic abuse     5 yrs ago lortab, off few yrs  . Anxiety   . Chronic pain     Past Surgical  History  Procedure Date  . Tonsillectomy   . Abdominal hysterectomy     left ovary remains  . Cholecystectomy 2010  . Tubal ligation   . Carpal tunnel release     both    Family History  Problem Relation Age of Onset  . Stroke Father   . Diverticulitis Father     History  Substance Use Topics  . Smoking status: Current Every Day Smoker -- 1.5 packs/day for 18 years    Types: Cigarettes  . Smokeless tobacco: Not on file  . Alcohol Use: Yes     Comment: rarely    OB History    Grav Para Term Preterm Abortions TAB SAB Ect Mult Living                  Review of Systems  Constitutional: Negative for fever, chills and diaphoresis.  Gastrointestinal: Positive for nausea and abdominal pain. Negative for vomiting, diarrhea, constipation, hematochezia and anorexia.  Genitourinary: Positive for frequency. Negative for dysuria, urgency, hematuria and vaginal discharge.  Musculoskeletal: Negative for back pain.  All other systems reviewed and are negative.    Allergies  Fioricet-codeine; Ibuprofen; Ketorolac tromethamine; and Divalproex sodium  Home Medications   Current Outpatient Rx  Name  Route  Sig  Dispense  Refill  . ALPRAZOLAM 1 MG PO TABS   Oral   Take 1 mg by mouth 4 (four) times daily as needed. Anxiety         . HYDROMORPHONE  HCL 4 MG PO TABS   Oral   Take 1 tablet (4 mg total) by mouth every 6 (six) hours as needed for pain.   30 tablet   0   . HYDROMORPHONE HCL 4 MG PO TABS   Oral   Take 1 tablet (4 mg total) by mouth every 6 (six) hours as needed for pain.   20 tablet   0   . PEG 3350-KCL-NA BICARB-NACL 420 G PO SOLR   Oral   Take 4,000 mLs by mouth as directed.   4000 mL   0   . PROMETHAZINE HCL 25 MG PO TABS   Oral   Take 1 tablet (25 mg total) by mouth every 6 (six) hours as needed for nausea.   20 tablet   0   . RANITIDINE HCL 150 MG PO CAPS   Oral   Take 1 capsule (150 mg total) by mouth 2 (two) times daily.   20 capsule   0      BP 114/68  Pulse 92  Temp 97.9 F (36.6 C) (Oral)  Resp 20  SpO2 100%  Physical Exam  Nursing note and vitals reviewed. Constitutional: She is oriented to person, place, and time. She appears well-developed and well-nourished. No distress.  HENT:  Head: Normocephalic and atraumatic.  Eyes: EOM are normal.  Neck: Normal range of motion.  Cardiovascular: Normal rate, regular rhythm and normal heart sounds.   Pulmonary/Chest: Effort normal and breath sounds normal.  Abdominal: Soft. Normal appearance and bowel sounds are normal. She exhibits no distension and no mass. There is generalized tenderness. There is no rigidity, no rebound, no guarding, no CVA tenderness, no tenderness at McBurney's point and negative Murphy's sign.  Musculoskeletal: Normal range of motion.  Neurological: She is alert and oriented to person, place, and time.  Skin: Skin is warm and dry.  Psychiatric: She has a normal mood and affect. Judgment normal.    ED Course  Procedures (including critical care time)  Labs Reviewed - No data to display No results found.   1. Abdominal pain of unknown etiology       MDM  F/u with dr. Sherwood Gambler and dr. Peyton Bottoms, [place] 02/21/12 [phone]

## 2012-02-21 NOTE — ED Notes (Signed)
Pt c/o abd pain around navel intermittant x 3-4 months. States this is the 3rd visit here in 12 hours. Pt states " they didn't do shit for me". Nad. Pt rating pain 8. C/o nausea. Denies v/d. Color wnl. Mm moist. Was unable to f/u with gi.

## 2012-02-21 NOTE — ED Notes (Signed)
Pt son is calling his dad to pick her up. Pt states she drove here.

## 2012-02-21 NOTE — ED Provider Notes (Signed)
History     CSN: [id number]  Arrival date & time 02/21/12  3244   First MD Initiated Contact with Patient 02/21/12 [phone]      Chief Complaint  Patient presents with  . Abdominal Pain    (Consider location/radiation/quality/duration/timing/severity/associated sxs/prior treatment) HPI Comments: Patient with history of chronic unexplained abdominal pain.  She presents here with a worsening of her pain.  She was set to have an endoscopy performed two weeks ago but she was a no-show for this.  She has made no attempt to reschedule.  She tells me she had to take her child for a tonsillectomy, however the chart says that she had to take her mother to social services.    Patient is a 31 y.o. female presenting with abdominal pain. The history is provided by the patient.  Abdominal Pain The primary symptoms of the illness include abdominal pain and nausea. The primary symptoms of the illness do not include fever, vomiting or dysuria.  The patient states that she believes she is currently not pregnant. The patient has not had a change in bowel habit. Symptoms associated with the illness do not include back pain.    Past Medical History  Diagnosis Date  . Hypokalemia   . Narcotic abuse     5 yrs ago lortab, off few yrs  . Anxiety   . Chronic pain     Past Surgical History  Procedure Date  . Tonsillectomy   . Abdominal hysterectomy     left ovary remains  . Cholecystectomy 2010  . Tubal ligation   . Carpal tunnel release     both    Family History  Problem Relation Age of Onset  . Stroke Father   . Diverticulitis Father     History  Substance Use Topics  . Smoking status: Current Every Day Smoker -- 1.5 packs/day for 18 years    Types: Cigarettes  . Smokeless tobacco: Not on file  . Alcohol Use: Yes     Comment: rarely    OB History    Grav Para Term Preterm Abortions TAB SAB Ect Mult Living                  Review of Systems  Constitutional: Negative for fever.   Gastrointestinal: Positive for nausea and abdominal pain. Negative for vomiting.  Genitourinary: Negative for dysuria.  Musculoskeletal: Negative for back pain.  All other systems reviewed and are negative.    Allergies  Fioricet-codeine; Ibuprofen; Ketorolac tromethamine; and Divalproex sodium  Home Medications   Current Outpatient Rx  Name  Route  Sig  Dispense  Refill  . ALPRAZOLAM 1 MG PO TABS   Oral   Take 1 mg by mouth 4 (four) times daily as needed. Anxiety         . HYDROMORPHONE HCL 4 MG PO TABS   Oral   Take 1 tablet (4 mg total) by mouth every 6 (six) hours as needed for pain.   30 tablet   0   . HYDROMORPHONE HCL 4 MG PO TABS   Oral   Take 1 tablet (4 mg total) by mouth every 6 (six) hours as needed for pain.   20 tablet   0   . PEG 3350-KCL-NA BICARB-NACL 420 G PO SOLR   Oral   Take 4,000 mLs by mouth as directed.   4000 mL   0   . PROMETHAZINE HCL 25 MG PO TABS   Oral   Take 1 tablet (  25 mg total) by mouth every 6 (six) hours as needed for nausea.   20 tablet   0   . RANITIDINE HCL 150 MG PO CAPS   Oral   Take 1 capsule (150 mg total) by mouth 2 (two) times daily.   20 capsule   0     BP 105/60  Pulse 92  Temp 98 F (36.7 C) (Oral)  Resp 18  Ht 5\' 1"  (1.549 m)  Wt 115 lb (52.164 kg)  BMI 21.73 kg/m2  SpO2 99%  Physical Exam  Nursing note and vitals reviewed. Constitutional: She appears well-developed and well-nourished. No distress.  HENT:  Head: Normocephalic and atraumatic.  Mouth/Throat: Oropharynx is clear and moist.  Neck: Normal range of motion. Neck supple.  Cardiovascular: Normal rate and regular rhythm.   No murmur heard. Pulmonary/Chest: Effort normal and breath sounds normal.  Abdominal: Soft. Bowel sounds are normal. She exhibits no distension. There is no rebound.       There is ttp in the lower abdomen in the LLQ and suprapubic region.    Musculoskeletal: Normal range of motion.  Neurological: She is alert.   Skin: Skin is warm and dry. She is not diaphoretic.    ED Course  Procedures (including critical care time)  Labs Reviewed - No data to display No results found.   No diagnosis found.    MDM  The patient presents here for a flare of her chronic abdominal pain.  She was set for a colonoscopy two weeks ago but no-showed.  The reason she gives me for missing this is not consistent with the reason that was given to the Gastroenterology nurse when she called to check on her.    Labs are unremarkable.  When I went to explain this to her, she had eloped from the ED without informing anyone.  No instructions were given.  I have strong suspicions that this patient is drug seeking.  She has the usual list of allergies, has been dishonest and non-compliant, then elopes when not given the medications that she wants.          Geoffery Lyons, MD 02/21/12 [phone]

## 2012-02-21 NOTE — ED Provider Notes (Signed)
History     CSN: [id number]  Arrival date & time 02/21/12  2307   First MD Initiated Contact with Patient 02/21/12 2311      Chief Complaint  Patient presents with  . Abdominal Pain    (Consider location/radiation/quality/duration/timing/severity/associated sxs/prior treatment) HPI [name] is a 31 y.o. female with a h/o chronic recurrent abdominal pain  who presents to the Emergency Department complaining of abdominal pain. She has been seen in the ER x 2 in the last 24 hours and at [facility] for the same. She has recently had a CT 01/25/12 which was normal. [place] of pelvis was normal.Labs have been normal. Pelvic exam done earlier today was unremarkable. Patient is complaining of continued lower abdominal pain. Her PCP is Dr. Sherwood Gambler who will not prescribe narcotic pain relievers. She was to be seen by Dr. Karilyn Cota, GI for colonoscopy and did not keep the appointment. Is rescheduled for an appointment with Dr. Karilyn Cota for scheduling of colonoscopy on Monday. She denies fever, chills, weight loss, nausea, vomiting, change in stool habits, diarrhea, blood in the stool.  PCP Dr. Sherwood Gambler GI Dr. Karilyn Cota Past Medical History  Diagnosis Date  . Hypokalemia   . Narcotic abuse     5 yrs ago lortab, off few yrs  . Anxiety   . Chronic pain   . Endometriosis     Past Surgical History  Procedure Date  . Tonsillectomy   . Abdominal hysterectomy     left ovary remains  . Cholecystectomy 2010  . Tubal ligation   . Carpal tunnel release     both    Family History  Problem Relation Age of Onset  . Stroke Father   . Diverticulitis Father     History  Substance Use Topics  . Smoking status: Current Every Day Smoker -- 1.5 packs/day for 18 years    Types: Cigarettes  . Smokeless tobacco: Not on file  . Alcohol Use: Yes     Comment: rarely    OB History    Grav Para Term Preterm Abortions TAB SAB Ect Mult Living   4 4 4       4       Review of Systems  Constitutional:  Negative for fever.       10 Systems reviewed and are negative for acute change except as noted in the HPI.  HENT: Negative for congestion.   Eyes: Negative for discharge and redness.  Respiratory: Negative for cough and shortness of breath.   Cardiovascular: Negative for chest pain.  Gastrointestinal: Positive for abdominal pain. Negative for vomiting.  Musculoskeletal: Negative for back pain.  Skin: Negative for rash.  Neurological: Negative for syncope, numbness and headaches.  Psychiatric/Behavioral:       No behavior change.    Allergies  Fioricet-codeine; Ibuprofen; Ketorolac tromethamine; and Divalproex sodium  Home Medications   Current Outpatient Rx  Name  Route  Sig  Dispense  Refill  . ALPRAZOLAM 1 MG PO TABS   Oral   Take 1 mg by mouth 4 (four) times daily as needed. Anxiety           There were no vitals taken for this visit.  Physical Exam  Nursing note and vitals reviewed. Constitutional:       Awake, alert, nontoxic appearance.  HENT:  Head: Atraumatic.  Eyes: Right eye exhibits no discharge. Left eye exhibits no discharge.  Neck: Neck supple.  Pulmonary/Chest: Effort normal. She exhibits no tenderness.  Abdominal: Soft. There  is tenderness. There is no rebound.       Pain to lower abdomen that is general to lower abdomen with no focal areas of tenderness.  Musculoskeletal: She exhibits no tenderness.       Baseline ROM, no obvious new focal weakness.  Neurological:       Mental status and motor strength appears baseline for patient and situation.  Skin: No rash noted.  Psychiatric: She has a normal mood and affect.    ED Course  Procedures (including critical care time)     MDM  Patient is with recurrent abdominal pain with multiple recent ER visits for the same. Given dilaudid and benadryl with some relief. Rx for tramadol. Encouraged to keep follow up with Dr. Karilyn Cota, GI and with Dr. Sherwood Gambler, PCP.Pt stable in ED with no significant  deterioration in condition.The patient appears reasonably screened and/or stabilized for discharge and I doubt any other medical condition or other [facility] requiring further screening, evaluation, or treatment in the ED at this time prior to discharge.  MDM Reviewed: nursing note, vitals and previous chart Reviewed previous: labs, x-ray, ultrasound and CT scan Interpretation: labs          Nicoletta Dress. Colon Branch, MD 02/22/12 [phone]

## 2012-02-21 NOTE — ED Notes (Signed)
Pt complaining of abdominal pain that started a month ago. Pt was seen in the er for the same thing yesterday. Pt denies vomiting or diarrhea.

## 2012-02-21 NOTE — MAU Note (Signed)
Lower abdominal pain x3 days. Some pink spotting today. States only has left ovary and was told had ovarian cysts in the past. Some nausea today, denies vomiting. Increased frequency of urination. Was seen at [facility] this morning for same complaint.

## 2012-02-21 NOTE — ED Notes (Signed)
Ride here and present. Walked out and pt got in passenger side. Advised security to look for same vehicle for the next few minutes to make sure they do not come back for her vehicle.

## 2012-02-21 NOTE — ED Notes (Signed)
Abdominal pain, possible pain in ovary per pt. Went to the [facility] hospital in [place] and they could not do anything for her because she was not pregnant.

## 2012-02-21 NOTE — ED Notes (Addendum)
Pt caught trying to leave. Triage nurse brought her back to room. Pt rude and states  "well it's not like the medicine is working anyway". Advised pt prior to giving med that it is dangerous and illegal to drive within 4 hours of taking narcotic. Pt instructed to wait until ride gets here. Cursing. Went in room and started calling staff "bitch"

## 2012-02-21 NOTE — MAU Provider Note (Signed)
History     CSN: [id number]  Arrival date & time 02/21/12  2017   None     No chief complaint on file.   (Consider location/radiation/quality/duration/timing/severity/associated sxs/prior treatment) HPI [name] is a 31 y.o. [id number] s/p hyst  with [facility] for endometrosis '07. She presents with c/o diffuse low abd pain that she has had for  2- 3 months. She has had multiple ED visits with pelvic U/S and CT, labs -all normal. She was at [facility] this am for same c/o. Exam and labs were neg. Pt is to be getting her pain w/u with [facility], does not have another appt . She failed to show for colonoscopy a few wks ago.  Past Medical History  Diagnosis Date  . Hypokalemia   . Narcotic abuse     5 yrs ago lortab, off few yrs  . Anxiety   . Chronic pain   . Endometriosis     Past Surgical History  Procedure Date  . Tonsillectomy   . Abdominal hysterectomy     left ovary remains  . Cholecystectomy 2010  . Tubal ligation   . Carpal tunnel release     both    Family History  Problem Relation Age of Onset  . Stroke Father   . Diverticulitis Father     History  Substance Use Topics  . Smoking status: Current Every Day Smoker -- 1.5 packs/day for 18 years    Types: Cigarettes  . Smokeless tobacco: Not on file  . Alcohol Use: Yes     Comment: rarely    OB History    Grav Para Term Preterm Abortions TAB SAB Ect Mult Living   4 4 4       4       Review of Systems  Constitutional: Negative for fever and chills.  Genitourinary: Positive for vaginal discharge and pelvic pain. Negative for dysuria, urgency, frequency and vaginal pain.    Allergies  Fioricet-codeine; Ibuprofen; Ketorolac tromethamine; and Divalproex sodium  Home Medications  No current outpatient prescriptions on file.  BP 119/65  Pulse 107  Temp 97.8 F (36.6 C) (Oral)  Resp 18  Ht 5\' 1"  (1.549 m)  Wt 120 lb (54.432 kg)  BMI 22.67 kg/m2  Physical Exam  Constitutional: She is  oriented to person, place, and time. She appears well-developed and well-nourished.  Abdominal: Soft. Bowel sounds are normal. She exhibits no distension and no mass. There is tenderness. There is no rebound and no guarding.  Genitourinary:       Pelvic: Vulva- nl anatomy, skin intact Vagina small amt thick white discharge, no bleeding noted Cx- absent Uterus- absent  Adn- no masses palp, tender bil  Musculoskeletal: Normal range of motion.  Neurological: She is alert and oriented to person, place, and time.  Skin: Skin is warm and dry.  Psychiatric: She has a normal mood and affect.    ED Course  Procedures (including critical care time)   Labs Reviewed  WET PREP, GENITAL   No results found. Results for orders placed during the hospital encounter of 02/21/12 (from the past 24 hour(s))  WET PREP, GENITAL     Status: Abnormal   Collection Time   02/21/12  8:54 PM      Component Value Range   Yeast Wet Prep HPF POC NONE SEEN  NONE SEEN   Trich, Wet Prep NONE SEEN  NONE SEEN   Clue Cells Wet Prep HPF POC NONE SEEN  NONE  SEEN   WBC, Wet Prep HPF POC FEW (*) NONE SEEN    ASSESSMENT:  Chronic pelvic pain thought to be GI in origin. Nl labs and benign exam this am and again here. Wet prep neg. I strongly encouraged her to f/u with her PCP for further evaluation of her pain.   No diagnosis found.    MDM

## 2012-02-22 ENCOUNTER — Emergency Department (HOSPITAL_COMMUNITY)
Admission: EM | Admit: 2012-02-22 | Discharge: 2012-02-22 | Discharge status: Against Medical Advice | Payer: BC Managed Care – PPO | Attending: Emergency Medicine | Admitting: Emergency Medicine

## 2012-02-22 ENCOUNTER — Emergency Department (HOSPITAL_COMMUNITY): Payer: BC Managed Care – PPO

## 2012-02-22 ENCOUNTER — Encounter (HOSPITAL_COMMUNITY): Payer: Self-pay | Admitting: Emergency Medicine

## 2012-02-22 DIAGNOSIS — G8929 Other chronic pain: Secondary | ICD-10-CM | POA: Insufficient documentation

## 2012-02-22 DIAGNOSIS — F411 Generalized anxiety disorder: Secondary | ICD-10-CM | POA: Insufficient documentation

## 2012-02-22 DIAGNOSIS — R109 Unspecified abdominal pain: Secondary | ICD-10-CM

## 2012-02-22 DIAGNOSIS — Z8742 Personal history of other diseases of the female genital tract: Secondary | ICD-10-CM | POA: Insufficient documentation

## 2012-02-22 DIAGNOSIS — F1110 Opioid abuse, uncomplicated: Secondary | ICD-10-CM | POA: Insufficient documentation

## 2012-02-22 DIAGNOSIS — F17200 Nicotine dependence, unspecified, uncomplicated: Secondary | ICD-10-CM | POA: Insufficient documentation

## 2012-02-22 DIAGNOSIS — Z862 Personal history of diseases of the blood and blood-forming organs and certain disorders involving the immune mechanism: Secondary | ICD-10-CM | POA: Insufficient documentation

## 2012-02-22 DIAGNOSIS — R1084 Generalized abdominal pain: Secondary | ICD-10-CM | POA: Insufficient documentation

## 2012-02-22 DIAGNOSIS — Z8639 Personal history of other endocrine, nutritional and metabolic disease: Secondary | ICD-10-CM | POA: Insufficient documentation

## 2012-02-22 DIAGNOSIS — R42 Dizziness and giddiness: Secondary | ICD-10-CM | POA: Insufficient documentation

## 2012-02-22 DIAGNOSIS — K920 Hematemesis: Secondary | ICD-10-CM | POA: Insufficient documentation

## 2012-02-22 LAB — CBC WITH DIFFERENTIAL/PLATELET
Eosinophils Absolute: 0.3 10*3/uL (ref 0.0–0.7)
Eosinophils Relative: 4 % (ref 0–5)
Hemoglobin: 13.9 g/dL (ref 12.0–15.0)
Lymphocytes Relative: 23 % (ref 12–46)
Lymphs Abs: 2 10*3/uL (ref 0.7–4.0)
MCH: 35.2 pg — ABNORMAL HIGH (ref 26.0–34.0)
MCV: 100.3 fL — ABNORMAL HIGH (ref 78.0–100.0)
Monocytes Relative: 6 % (ref 3–12)
Platelets: 303 10*3/uL (ref 150–400)
RBC: 3.95 MIL/uL (ref 3.87–5.11)
WBC: 8.6 10*3/uL (ref 4.0–10.5)

## 2012-02-22 LAB — COMPREHENSIVE METABOLIC PANEL
ALT: 25 U/L (ref 0–35)
Alkaline Phosphatase: 80 U/L (ref 39–117)
BUN: 12 mg/dL (ref 6–23)
CO2: 27 mEq/L (ref 19–32)
Calcium: 9.2 mg/dL (ref 8.4–10.5)
Glucose, Bld: 93 mg/dL (ref 70–99)
Potassium: 3.7 mEq/L (ref 3.5–5.1)
Sodium: 139 mEq/L (ref 135–145)
Total Bilirubin: 0.5 mg/dL (ref 0.3–1.2)
Total Protein: 7.6 g/dL (ref 6.0–8.3)

## 2012-02-22 LAB — URINALYSIS, ROUTINE W REFLEX MICROSCOPIC
Glucose, UA: NEGATIVE mg/dL
Ketones, ur: NEGATIVE mg/dL
Leukocytes, UA: NEGATIVE
Specific Gravity, Urine: <1.005 — ABNORMAL LOW (ref 1.005–1.030)
pH: 6 (ref 5.0–8.0)

## 2012-02-22 LAB — OCCULT BLOOD, POC DEVICE: Fecal Occult Bld: NEGATIVE

## 2012-02-22 MED ORDER — SODIUM CHLORIDE 0.9 % IV BOLUS (SEPSIS)
1000.0000 mL | Freq: Once | INTRAVENOUS | Status: AC
  Administered 2012-02-22: 1000 mL via INTRAVENOUS

## 2012-02-22 MED ORDER — TRAMADOL HCL 50 MG PO TABS: 50.0000 mg | ORAL_TABLET | Freq: Four times a day (QID) | ORAL | Status: DC | PRN

## 2012-02-22 MED ORDER — PROMETHAZINE HCL 25 MG/ML IJ SOLN
25.0000 mg | Freq: Once | INTRAMUSCULAR | Status: AC
  Administered 2012-02-22: 25 mg via INTRAVENOUS
  Filled 2012-02-22: qty 1

## 2012-02-22 NOTE — ED Notes (Signed)
Pt c/o abd pain and dizzy. Pt states she has been having abd pain x 2 months. Pt was seen here yesterday.

## 2012-02-22 NOTE — ED Notes (Signed)
Pt c/o generalized abd pain x 1 1/2 - 2 months.  Reports this morning vomited bright red blood.  Pt says was seen several times for the same thing but says everyone keeps telling her that it is "nothing wrong."  LBM was yesterday and was normal per pt.  Denies seeing any black or bloody stools.  Had colonoscopy scheduled for Nov but was cancelled because her daughter had to have her tonsils removed.

## 2012-02-22 NOTE — ED Notes (Signed)
Pt upset because no pain medication was given.  Dr. Rosalia Hammers aware.  PT verbalized was walking out.  Loyal Gambler RN verified that IV was out.  PT left without signing ama form.

## 2012-02-22 NOTE — Discharge Instructions (Signed)
Keep your follow up with Dr. Karilyn Cota. Speak with Dr. Sherwood Gambler about medicine alternatives or pain management options.

## 2012-02-22 NOTE — ED Provider Notes (Addendum)
History     CSN: [id number]  Arrival date & time 02/22/12  1643   First MD Initiated Contact with Patient 02/22/12 1720      Chief Complaint  Patient presents with  . Dizziness  . Abdominal Pain    (Consider location/radiation/quality/duration/timing/severity/associated sxs/prior treatment) HPI   Patient complaining of chronic recurrent abdominal pain presents with abdominal pain.  She has been seen in the ed x3 and at [facility] hospital for the same pain in the past 24 hours.  Recent CT and [place] of pelvisnotmal. Continues to complain of lower abdominal pain and wants pain medicine.  She is followed by Drs Karilyn Cota and Sherwood Gambler.  No fever, not pregnanct, no abnormal vaginal discharge, vomiting or diarrhea.    Past Medical History  Diagnosis Date  . Hypokalemia   . Narcotic abuse     5 yrs ago lortab, off few yrs  . Anxiety   . Chronic pain   . Endometriosis     Past Surgical History  Procedure Date  . Tonsillectomy   . Abdominal hysterectomy     left ovary remains  . Cholecystectomy 2010  . Tubal ligation   . Carpal tunnel release     both    Family History  Problem Relation Age of Onset  . Stroke Father   . Diverticulitis Father     History  Substance Use Topics  . Smoking status: Current Every Day Smoker -- 1.5 packs/day for 18 years    Types: Cigarettes  . Smokeless tobacco: Not on file  . Alcohol Use: Yes     Comment: rarely    OB History    Grav Para Term Preterm Abortions TAB SAB Ect Mult Living   4 4 4       4       Review of Systems  All other systems reviewed and are negative.    Allergies  Fioricet-codeine; Ibuprofen; Ketorolac tromethamine; and Divalproex sodium  Home Medications   Current Outpatient Rx  Name  Route  Sig  Dispense  Refill  . ALPRAZOLAM 1 MG PO TABS   Oral   Take 1 mg by mouth 4 (four) times daily as needed. Anxiety         . TRAMADOL HCL 50 MG PO TABS   Oral   Take 1 tablet (50 mg total) by mouth every 6 (six) hours  as needed for pain.   15 tablet   0     BP 105/56  Pulse 94  Temp 97.5 F (36.4 C) (Oral)  Resp 20  Ht 5\' 1"  (1.549 m)  Wt 120 lb (54.432 kg)  BMI 22.67 kg/m2  SpO2 100%  Physical Exam  Nursing note and vitals reviewed. Constitutional: She is oriented to person, place, and time. She appears well-developed and well-nourished.  HENT:  Head: Normocephalic and atraumatic.  Eyes: Conjunctivae normal are normal. Pupils are equal, round, and reactive to light.  Neck: Normal range of motion. Neck supple.  Cardiovascular: Normal rate.   Pulmonary/Chest: Effort normal and breath sounds normal.  Abdominal: Soft.       Mild diffuse lower abdominal tenderness  Musculoskeletal: Normal range of motion.  Neurological: She is alert and oriented to person, place, and time.  Skin: Skin is warm and dry.    ED Course  Procedures (including critical care time)  Labs Reviewed  CBC WITH DIFFERENTIAL - Abnormal; Notable for the following:    MCV 100.3 (*)     MCH 35.2 (*)  All other components within normal limits  URINALYSIS, ROUTINE W REFLEX MICROSCOPIC - Abnormal; Notable for the following:    Specific Gravity, Urine <1.005 (*)     All other components within normal limits  COMPREHENSIVE METABOLIC PANEL  LIPASE, BLOOD  OCCULT BLOOD, POC DEVICE   Dg Abd 1 View  02/22/2012  *RADIOLOGY REPORT*  Clinical Data: Abdominal pain  ABDOMEN - 1 VIEW  Comparison: 01/31/2012  Findings: Nonspecific bowel gas pattern without disproportionate small bowel dilatation to suggest small bowel obstruction.  Cholecystectomy clips.  Surgical clips in the right pelvis.  Multiple calcified pelvic phleboliths.  Visualized osseous structures are within normal limits.  IMPRESSION: Unremarkable abdominal radiograph.   Original Report Authenticated By: Charline Bills, M.D.      No diagnosis found.   Results for orders placed during the hospital encounter of 02/22/12  CBC WITH DIFFERENTIAL      Component  Value Range   WBC 8.6  4.0 - 10.5 K/uL   RBC 3.95  3.87 - 5.11 MIL/uL   Hemoglobin 13.9  12.0 - 15.0 g/dL   HCT 14.7  82.9 - 56.2 %   MCV 100.3 (*) 78.0 - 100.0 fL   MCH 35.2 (*) 26.0 - 34.0 pg   MCHC 35.1  30.0 - 36.0 g/dL   RDW 13.0  86.5 - 78.4 %   Platelets 303  150 - 400 K/uL   Neutrophils Relative 67  43 - 77 %   Neutro Abs 5.7  1.7 - 7.7 K/uL   Lymphocytes Relative 23  12 - 46 %   Lymphs Abs 2.0  0.7 - 4.0 K/uL   Monocytes Relative 6  3 - 12 %   Monocytes Absolute 0.5  0.1 - 1.0 K/uL   Eosinophils Relative 4  0 - 5 %   Eosinophils Absolute 0.3  0.0 - 0.7 K/uL   Basophils Relative 1  0 - 1 %   Basophils Absolute 0.0  0.0 - 0.1 K/uL  COMPREHENSIVE METABOLIC PANEL      Component Value Range   Sodium 139  135 - 145 mEq/L   Potassium 3.7  3.5 - 5.1 mEq/L   Chloride 102  96 - 112 mEq/L   CO2 27  19 - 32 mEq/L   Glucose, Bld 93  70 - 99 mg/dL   BUN 12  6 - 23 mg/dL   Creatinine, Ser 6.96  0.50 - 1.10 mg/dL   Calcium 9.2  8.4 - 29.5 mg/dL   Total Protein 7.6  6.0 - 8.3 g/dL   Albumin 4.2  3.5 - 5.2 g/dL   AST 20  0 - 37 U/L   ALT 25  0 - 35 U/L   Alkaline Phosphatase 80  39 - 117 U/L   Total Bilirubin 0.5  0.3 - 1.2 mg/dL   GFR calc non Af Amer >90  >90 mL/min   GFR calc Af Amer >90  >90 mL/min  LIPASE, BLOOD      Component Value Range   Lipase 27  11 - 59 U/L  URINALYSIS, ROUTINE W REFLEX MICROSCOPIC      Component Value Range   Color, Urine YELLOW  YELLOW   APPearance CLEAR  CLEAR   Specific Gravity, Urine <1.005 (*) 1.005 - 1.030   pH 6.0  5.0 - 8.0   Glucose, UA NEGATIVE  NEGATIVE mg/dL   Hgb urine dipstick NEGATIVE  NEGATIVE   Bilirubin Urine NEGATIVE  NEGATIVE   Ketones, ur NEGATIVE  NEGATIVE mg/dL  Protein, ur NEGATIVE  NEGATIVE mg/dL   Urobilinogen, UA 0.2  0.0 - 1.0 mg/dL   Nitrite NEGATIVE  NEGATIVE   Leukocytes, UA NEGATIVE  NEGATIVE  OCCULT BLOOD, POC DEVICE      Component Value Range   Fecal Occult Bld NEGATIVE  NEGATIVE    MDM  Patient left  AGAINST MEDICAL ADVICE prior to my reevaluation. Her labs are normal.  During my initial evaluation I did advise her to continue to followup with Dr. Dionicia Abler for her abdominal pain.      Hilario Quarry, MD 02/22/12 9528  Hilario Quarry, MD 04/05/12 [phone]

## 2012-02-24 ENCOUNTER — Encounter (HOSPITAL_COMMUNITY): Payer: Self-pay

## 2012-02-24 ENCOUNTER — Emergency Department (HOSPITAL_COMMUNITY)
Admission: EM | Admit: 2012-02-24 | Discharge: 2012-02-24 | Disposition: A | Discharge status: Home / Self Care | Payer: BC Managed Care – PPO | Attending: Emergency Medicine | Admitting: Emergency Medicine

## 2012-02-24 DIAGNOSIS — R1031 Right lower quadrant pain: Secondary | ICD-10-CM | POA: Insufficient documentation

## 2012-02-24 DIAGNOSIS — Z79899 Other long term (current) drug therapy: Secondary | ICD-10-CM | POA: Insufficient documentation

## 2012-02-24 DIAGNOSIS — Z3202 Encounter for pregnancy test, result negative: Secondary | ICD-10-CM | POA: Insufficient documentation

## 2012-02-24 DIAGNOSIS — Z9089 Acquired absence of other organs: Secondary | ICD-10-CM | POA: Insufficient documentation

## 2012-02-24 DIAGNOSIS — R109 Unspecified abdominal pain: Secondary | ICD-10-CM

## 2012-02-24 DIAGNOSIS — F17200 Nicotine dependence, unspecified, uncomplicated: Secondary | ICD-10-CM | POA: Insufficient documentation

## 2012-02-24 DIAGNOSIS — F411 Generalized anxiety disorder: Secondary | ICD-10-CM | POA: Insufficient documentation

## 2012-02-24 DIAGNOSIS — R110 Nausea: Secondary | ICD-10-CM | POA: Insufficient documentation

## 2012-02-24 DIAGNOSIS — G8929 Other chronic pain: Secondary | ICD-10-CM | POA: Insufficient documentation

## 2012-02-24 DIAGNOSIS — Z8742 Personal history of other diseases of the female genital tract: Secondary | ICD-10-CM | POA: Insufficient documentation

## 2012-02-24 LAB — CBC WITH DIFFERENTIAL/PLATELET
Basophils Absolute: 0 10*3/uL (ref 0.0–0.1)
Basophils Relative: 0 % (ref 0–1)
Eosinophils Relative: 3 % (ref 0–5)
Lymphocytes Relative: 36 % (ref 12–46)
MCHC: 35 g/dL (ref 30.0–36.0)
Neutro Abs: 5.6 10*3/uL (ref 1.7–7.7)
Platelets: 273 10*3/uL (ref 150–400)
RDW: 11.9 % (ref 11.5–15.5)
WBC: 10 10*3/uL (ref 4.0–10.5)

## 2012-02-24 LAB — URINALYSIS, ROUTINE W REFLEX MICROSCOPIC
Leukocytes, UA: NEGATIVE
Nitrite: NEGATIVE
Specific Gravity, Urine: 1.015 (ref 1.005–1.030)
Urobilinogen, UA: 0.2 mg/dL (ref 0.0–1.0)

## 2012-02-24 LAB — COMPREHENSIVE METABOLIC PANEL
ALT: 21 U/L (ref 0–35)
AST: 17 U/L (ref 0–37)
Albumin: 4 g/dL (ref 3.5–5.2)
CO2: 26 mEq/L (ref 19–32)
Calcium: 8.8 mg/dL (ref 8.4–10.5)
Chloride: 105 mEq/L (ref 96–112)
GFR calc non Af Amer: >90 mL/min (ref >90)
Sodium: 139 mEq/L (ref 135–145)

## 2012-02-24 LAB — PREGNANCY, URINE: Preg Test, Ur: NEGATIVE

## 2012-02-24 MED ORDER — HYDROMORPHONE HCL PF 1 MG/ML IJ SOLN
1.0000 mg | Freq: Once | INTRAMUSCULAR | Status: AC
  Administered 2012-02-24: 1 mg via INTRAVENOUS

## 2012-02-24 MED ORDER — ONDANSETRON HCL 4 MG/2ML IJ SOLN
4.0000 mg | Freq: Once | INTRAMUSCULAR | Status: AC
  Administered 2012-02-24: 4 mg via INTRAVENOUS
  Filled 2012-02-24: qty 2

## 2012-02-24 MED ORDER — HYDROMORPHONE HCL PF 1 MG/ML IJ SOLN
1.0000 mg | Freq: Once | INTRAMUSCULAR | Status: AC
  Administered 2012-02-24: 1 mg via INTRAVENOUS
  Filled 2012-02-24: qty 1

## 2012-02-24 MED ORDER — HYDROMORPHONE HCL PF 1 MG/ML IJ SOLN
INTRAMUSCULAR | Status: AC
  Filled 2012-02-24: qty 1

## 2012-02-24 MED ORDER — SODIUM CHLORIDE 0.9 % IV BOLUS (SEPSIS)
1000.0000 mL | Freq: Once | INTRAVENOUS | Status: AC
  Administered 2012-02-24: 1000 mL via INTRAVENOUS

## 2012-02-24 MED ORDER — SODIUM CHLORIDE 0.9 % IV SOLN: Freq: Once | INTRAVENOUS | Status: DC

## 2012-02-24 NOTE — MAU Provider Note (Signed)
Attestation of Attending Supervision of Advanced Practitioner (CNM/NP): Evaluation and management procedures were performed by the Advanced Practitioner under my supervision and collaboration.  I have reviewed the Advanced Practitioner's note and chart, and I agree with the management and plan.  Beyla Loney 02/24/2012 6:20 PM   

## 2012-02-24 NOTE — ED Provider Notes (Signed)
History   This chart was scribed for Dione Booze, MD, by Frederik Pear, ER scribe. The patient was seen in room APAH7/APAH7 and the patient's care was started at 1622.    CSN: [id number]  Arrival date & time 02/24/12  1359   First MD Initiated Contact with Patient 02/24/12 1622      Chief Complaint  Patient presents with  . Abdominal Pain    (Consider location/radiation/quality/duration/timing/severity/associated sxs/prior treatment) HPI Comments: [name] is a 31 y.o. female who presents to the Emergency Department complaining of constant, sharp, 10/10 RLQ abdomen with nausea that began about 13:00. She states that the pain is aggrated by stretching her body. She denies any polyuria, constipation, diarrhea, or dysuria. She has a h/o of similar abdomen pain. She denies any treatment at home. She reports that the only thing that helps the pain is Dilaudid, but the pain never fully goes away. She has a surgical h/o of a cholecystectomy. She is a current, almost 1 pack a day, every day smoker who consumes ETOH occasionally and does not use street drugs.   PCP is Dr. Sherwood Gambler.   Past Medical History  Diagnosis Date  . Hypokalemia   . Narcotic abuse     5 yrs ago lortab, off few yrs  . Anxiety   . Chronic pain   . Endometriosis     Past Surgical History  Procedure Date  . Tonsillectomy   . Abdominal hysterectomy     left ovary remains  . Cholecystectomy 2010  . Tubal ligation   . Carpal tunnel release     both    Family History  Problem Relation Age of Onset  . Stroke Father   . Diverticulitis Father     History  Substance Use Topics  . Smoking status: Current Every Day Smoker -- 1.5 packs/day for 18 years    Types: Cigarettes  . Smokeless tobacco: Not on file  . Alcohol Use: Yes     Comment: rarely    OB History    Grav Para Term Preterm Abortions TAB SAB Ect Mult Living   4 4 4       4       Review of Systems  Gastrointestinal: Positive for abdominal  pain.  All other systems reviewed and are negative.    Allergies  Fioricet-codeine; Ibuprofen; Ketorolac tromethamine; and Divalproex sodium  Home Medications   Current Outpatient Rx  Name  Route  Sig  Dispense  Refill  . ALPRAZOLAM 1 MG PO TABS   Oral   Take 1 mg by mouth 4 (four) times daily as needed. Anxiety         . TRAMADOL HCL 50 MG PO TABS   Oral   Take 1 tablet (50 mg total) by mouth every 6 (six) hours as needed for pain.   15 tablet   0     BP 118/71  Pulse 76  Temp 97.9 F (36.6 C) (Oral)  Resp 20  Ht 5\' 1"  (1.549 m)  Wt 120 lb (54.432 kg)  BMI 22.67 kg/m2  SpO2 99%  Physical Exam  Nursing note and vitals reviewed. Constitutional: She is oriented to person, place, and time. She appears well-developed and well-nourished. No distress.       She appears to be in pain and is writhing on the bed.   HENT:  Head: Normocephalic and atraumatic.  Eyes: EOM are normal. Pupils are equal, round, and reactive to light.  Neck: Normal range of motion.  Neck supple. No tracheal deviation present.  Cardiovascular: Normal rate.   Pulmonary/Chest: Effort normal. No respiratory distress.  Abdominal: Soft. She exhibits no distension. Bowel sounds are decreased. There is no rebound and no guarding.       Her abdomen is soft with diffused tenderness, but not rebound or guarding. She has decreased bowel sounds.  Musculoskeletal: Normal range of motion. She exhibits no edema.  Neurological: She is alert and oriented to person, place, and time.  Skin: Skin is warm and dry.  Psychiatric: She has a normal mood and affect. Her behavior is normal.    ED Course  Procedures (including critical care time)  DIAGNOSTIC STUDIES: Oxygen Saturation is 99% on room air, normal by my interpretation.    COORDINATION OF CARE:  16:42- Discussed planned course of treatment with the patient, including an IV and pain and nausea medication, who is agreeable at this time.  16:45- Medication  Orders- sodium chloride 0.9% bolus 1,000 mL-Once, 0.9% sodium chloride infusion- Once, Hydromorphone (Dilaudid) injection 1 mg- Once, ondansetron (Zofran) injection 4 mg- Once.  19:05- Recheck- Discussed negative results from labs. She is ambulatory and tearful.  Results for orders placed during the hospital encounter of 02/24/12  URINALYSIS, ROUTINE W REFLEX MICROSCOPIC      Component Value Range   Color, Urine YELLOW  YELLOW   APPearance CLEAR  CLEAR   Specific Gravity, Urine 1.015  1.005 - 1.030   pH 5.5  5.0 - 8.0   Glucose, UA NEGATIVE  NEGATIVE mg/dL   Hgb urine dipstick NEGATIVE  NEGATIVE   Bilirubin Urine NEGATIVE  NEGATIVE   Ketones, ur NEGATIVE  NEGATIVE mg/dL   Protein, ur NEGATIVE  NEGATIVE mg/dL   Urobilinogen, UA 0.2  0.0 - 1.0 mg/dL   Nitrite NEGATIVE  NEGATIVE   Leukocytes, UA NEGATIVE  NEGATIVE  PREGNANCY, URINE      Component Value Range   Preg Test, Ur NEGATIVE  NEGATIVE  CBC WITH DIFFERENTIAL      Component Value Range   WBC 10.0  4.0 - 10.5 K/uL   RBC 3.52 (*) 3.87 - 5.11 MIL/uL   Hemoglobin 12.2  12.0 - 15.0 g/dL   HCT 16.1 (*) 09.6 - 04.5 %   MCV 99.1  78.0 - 100.0 fL   MCH 34.7 (*) 26.0 - 34.0 pg   MCHC 35.0  30.0 - 36.0 g/dL   RDW 40.9  81.1 - 91.4 %   Platelets 273  150 - 400 K/uL   Neutrophils Relative 56  43 - 77 %   Neutro Abs 5.6  1.7 - 7.7 K/uL   Lymphocytes Relative 36  12 - 46 %   Lymphs Abs 3.5  0.7 - 4.0 K/uL   Monocytes Relative 6  3 - 12 %   Monocytes Absolute 0.6  0.1 - 1.0 K/uL   Eosinophils Relative 3  0 - 5 %   Eosinophils Absolute 0.3  0.0 - 0.7 K/uL   Basophils Relative 0  0 - 1 %   Basophils Absolute 0.0  0.0 - 0.1 K/uL  COMPREHENSIVE METABOLIC PANEL      Component Value Range   Sodium 139  135 - 145 mEq/L   Potassium 3.7  3.5 - 5.1 mEq/L   Chloride 105  96 - 112 mEq/L   CO2 26  19 - 32 mEq/L   Glucose, Bld 83  70 - 99 mg/dL   BUN 7  6 - 23 mg/dL   Creatinine, Ser 7.82  0.50 - 1.10 mg/dL   Calcium 8.8  8.4 - 16.1 mg/dL    Total Protein 6.9  6.0 - 8.3 g/dL   Albumin 4.0  3.5 - 5.2 g/dL   AST 17  0 - 37 U/L   ALT 21  0 - 35 U/L   Alkaline Phosphatase 71  39 - 117 U/L   Total Bilirubin 0.6  0.3 - 1.2 mg/dL   GFR calc non Af Amer >90  >90 mL/min   GFR calc Af Amer >90  >90 mL/min  LIPASE, BLOOD      Component Value Range   Lipase 15  11 - 59 U/L     1. Abdominal pain       MDM  Exacerbation of chronic abdominal pain. Old records are reviewed, and she has multiple ED visits for abdominal pain. She's had negative ultrasounds and negative CT scans. She was supposed to have a colonoscopy but canceled and she states that the colonoscopy is been rescheduled for sometime in January. Her abdominal exam is fairly benign. WBC is not elevated, I will not do any further imaging of her abdomen and appeared to be given IV fluids and IV hydromorphone and IV ondansetron.  She states she only got temporary relief with the above-noted medication. I have informed her that I would give her a second dose of hydromorphone. She is requesting a prescription for hydromorphone at home and I told her that I would not give her a prescription for that medication. She then asked for a prescription for Vicodin and advised her that I would not give her that either. I did offer to give her a prescription for more tramadol which she states she did not want because it did not help her. Following this exchange, she told the nurse that "if I'm not going to get a prescription, I'm is to leave". She is discharged and instructed to followup with her gastroenterologist to try and find the reason for her abdominal pain. However, at the moment, it appears to be mainly drug-seeking behavior.  I personally performed the services described in this documentation, which was scribed in my presence. The recorded information has been reviewed and is accurate.            Dione Booze, MD 02/24/12 2001

## 2012-02-24 NOTE — ED Notes (Signed)
Complain of pain in right lower abdomen

## 2012-02-24 NOTE — ED Notes (Signed)
Pt with severe abd pain off and on for 2 months

## 2012-02-25 NOTE — ED Provider Notes (Signed)
Medical screening examination/treatment/procedure(s) were performed by non-physician practitioner and as supervising physician I was immediately available for consultation/collaboration. Devoria Albe, MD, Armando Gang   Ward Givens, MD 02/25/12 [phone]

## 2012-02-27 ENCOUNTER — Encounter (HOSPITAL_COMMUNITY): Payer: Self-pay | Admitting: *Deleted

## 2012-02-27 ENCOUNTER — Encounter (HOSPITAL_COMMUNITY): Payer: Self-pay

## 2012-02-27 ENCOUNTER — Emergency Department (HOSPITAL_COMMUNITY)
Admission: EM | Admit: 2012-02-27 | Discharge: 2012-02-27 | Disposition: A | Discharge status: Home / Self Care | Payer: BC Managed Care – PPO | Attending: Emergency Medicine | Admitting: Emergency Medicine

## 2012-02-27 DIAGNOSIS — K089 Disorder of teeth and supporting structures, unspecified: Secondary | ICD-10-CM | POA: Insufficient documentation

## 2012-02-27 DIAGNOSIS — Z8742 Personal history of other diseases of the female genital tract: Secondary | ICD-10-CM | POA: Insufficient documentation

## 2012-02-27 DIAGNOSIS — F411 Generalized anxiety disorder: Secondary | ICD-10-CM | POA: Insufficient documentation

## 2012-02-27 DIAGNOSIS — F1110 Opioid abuse, uncomplicated: Secondary | ICD-10-CM | POA: Insufficient documentation

## 2012-02-27 DIAGNOSIS — F1410 Cocaine abuse, uncomplicated: Secondary | ICD-10-CM | POA: Insufficient documentation

## 2012-02-27 DIAGNOSIS — E876 Hypokalemia: Secondary | ICD-10-CM | POA: Insufficient documentation

## 2012-02-27 DIAGNOSIS — F17200 Nicotine dependence, unspecified, uncomplicated: Secondary | ICD-10-CM | POA: Insufficient documentation

## 2012-02-27 DIAGNOSIS — G8929 Other chronic pain: Secondary | ICD-10-CM | POA: Insufficient documentation

## 2012-02-27 DIAGNOSIS — K0889 Other specified disorders of teeth and supporting structures: Secondary | ICD-10-CM

## 2012-02-27 DIAGNOSIS — Z87442 Personal history of urinary calculi: Secondary | ICD-10-CM | POA: Insufficient documentation

## 2012-02-27 DIAGNOSIS — R109 Unspecified abdominal pain: Secondary | ICD-10-CM | POA: Insufficient documentation

## 2012-02-27 MED ORDER — PENICILLIN V POTASSIUM 500 MG PO TABS: 500.0000 mg | ORAL_TABLET | Freq: Four times a day (QID) | ORAL | Status: AC

## 2012-02-27 NOTE — ED Notes (Addendum)
Hematuria, onset today, now having difficulty voiding.  Says she had a syncope episode today, also.  Similar episode in past due to low potassium.   Alert .  Nausea.  ABd pain, nausea.   Says she feels sob at times.

## 2012-02-27 NOTE — ED Notes (Signed)
Pt c/o left lower tooth pain after eating potato chips this morning.

## 2012-02-27 NOTE — ED Provider Notes (Signed)
History  This chart was scribed for Joya Gaskins, MD by Ardeen Jourdain, ED Scribe. This patient was seen in room APA03/APA03 and the patient's care was started at 1010.  CSN: [id number]  Arrival date & time 02/27/12  1004   First MD Initiated Contact with Patient 02/27/12 1010      Chief Complaint  Patient presents with  . Dental Pain     Patient is a 31 y.o. female presenting with tooth pain. The history is provided by the patient. No language interpreter was used.  Dental PainThe primary symptoms include dental injury. Primary symptoms do not include mouth pain, headaches, fever or sore throat. The symptoms began 1 to 2 hours ago. The symptoms are unchanged. The symptoms occur constantly.    [name] is a 31 y.o. female who presents to the Emergency Department complaining of gradually worsening, sudden onset left lower tooth pain. She states the pain started after eating potato chips. She reports being in the process of getting her teeth "fixed" but was unable to get in today.     Past Medical History  Diagnosis Date  . Hypokalemia   . Narcotic abuse     5 yrs ago lortab, off few yrs  . Anxiety   . Chronic pain   . Endometriosis     Past Surgical History  Procedure Date  . Tonsillectomy   . Abdominal hysterectomy     left ovary remains  . Cholecystectomy 2010  . Tubal ligation   . Carpal tunnel release     both    Family History  Problem Relation Age of Onset  . Stroke Father   . Diverticulitis Father     History  Substance Use Topics  . Smoking status: Current Every Day Smoker -- 1.5 packs/day for 18 years    Types: Cigarettes  . Smokeless tobacco: Not on file  . Alcohol Use: Yes     Comment: rarely    OB History    Grav Para Term Preterm Abortions TAB SAB Ect Mult Living   4 4 4       4       Review of Systems  Constitutional: Negative for fever.  HENT: Positive for dental problem. Negative for sore throat.   Neurological: Negative for  headaches.    Allergies  Fioricet-codeine; Ibuprofen; Ketorolac tromethamine; and Divalproex sodium  Home Medications   Current Outpatient Rx  Name  Route  Sig  Dispense  Refill  . ACETAMINOPHEN 500 MG PO TABS   Oral   Take 1,000 mg by mouth every 6 (six) hours as needed. Pain         . ALPRAZOLAM 1 MG PO TABS   Oral   Take 1 mg by mouth 4 (four) times daily as needed. Anxiety         . TRAMADOL HCL 50 MG PO TABS   Oral   Take 1 tablet (50 mg total) by mouth every 6 (six) hours as needed for pain.   15 tablet   0   . PENICILLIN V POTASSIUM 500 MG PO TABS   Oral   Take 1 tablet (500 mg total) by mouth 4 (four) times daily.   40 tablet   0     Triage Vitals: BP 108/57  Pulse 72  Temp 98.2 F (36.8 C)  Resp 16  Ht 5\' 1"  (1.549 m)  Wt 120 lb (54.432 kg)  BMI 22.67 kg/m2  SpO2 100%  Physical Exam  CONSTITUTIONAL:  Well developed/well nourished HEAD AND FACE: Normocephalic/atraumatic EYES: EOMI/PERRL ENMT: Mucous membranes moist.  Poor dentition.  No trismus.  No focal abscess noted. NECK: supple no meningeal signs CV: S1/S2 noted, no murmurs/rubs/gallops noted LUNGS: Lungs are clear to auscultation bilaterally, no apparent distress ABDOMEN: soft, nontender, no rebound or guarding NEURO: Pt is awake/alert, moves all extremitiesx4 EXTREMITIES:full ROM SKIN: warm, color normal  ED Course  Procedures   DIAGNOSTIC STUDIES: Oxygen Saturation is 100% on room air, normal by my interpretation.    COORDINATION OF CARE:  10:49 AM: Discussed treatment plan which includes follow up with dentist with pt at bedside and pt agreed to plan.    1. Pain, dental       MDM  Nursing notes including past medical history and social history reviewed and considered in documentation       I personally performed the services described in this documentation, which was scribed in my presence. The recorded information has been reviewed and is accurate.      Joya Gaskins, MD 02/27/12 [phone]

## 2012-02-28 ENCOUNTER — Emergency Department (HOSPITAL_COMMUNITY)
Admission: EM | Admit: 2012-02-28 | Discharge: 2012-02-28 | Disposition: A | Discharge status: Home / Self Care | Payer: BC Managed Care – PPO | Attending: Emergency Medicine | Admitting: Emergency Medicine

## 2012-02-28 DIAGNOSIS — R109 Unspecified abdominal pain: Secondary | ICD-10-CM

## 2012-02-28 LAB — URINALYSIS, ROUTINE W REFLEX MICROSCOPIC
Bilirubin Urine: NEGATIVE
Glucose, UA: NEGATIVE mg/dL
Nitrite: NEGATIVE
Specific Gravity, Urine: 1.01 (ref 1.005–1.030)
pH: 6.5 (ref 5.0–8.0)

## 2012-02-28 MED ORDER — NAPROXEN 250 MG PO TABS
500.0000 mg | ORAL_TABLET | Freq: Once | ORAL | Status: DC
  Filled 2012-02-28: qty 2

## 2012-02-28 NOTE — ED Notes (Signed)
Discharge instructions reviewed with pt, questions answered. Pt verbalized understanding.  

## 2012-02-28 NOTE — ED Notes (Signed)
Family at bedside. Patient states she is in extreme pain. MD aware.

## 2012-02-28 NOTE — ED Notes (Signed)
Family at bedside. 

## 2012-02-28 NOTE — ED Notes (Signed)
Patient would like something for pain. RN aware. 

## 2012-02-28 NOTE — ED Provider Notes (Signed)
History     CSN: [id number]  Arrival date & time 02/27/12  2247   First MD Initiated Contact with Patient 02/28/12 0051      Chief Complaint  Patient presents with  . Hematuria    (Consider location/radiation/quality/duration/timing/severity/associated sxs/prior treatment) HPI  [name] is a 31 y.o. female  With a h/o chronic abdominal pain, recent dental pain, who presents to the Emergency Department complaining of hematuria just prior to arrival associated with lower abdominal pain. She has no h/o kidney stones. She is seen frequently for pain complaints, most recently yesterday for dental pain. She has had CT abdomen and pelvis as recently as 01/25/12 which was normal, acute abdominal series 01/31/12 which was normal and acute abdomen 02/22/12 which was normal. She denies fever, chills, vomiting, diarrhea. She is scheduled for follow up with OB/GYN on 03/11/12 and with GI on 1/613.  PCP Dr. Sherwood Gambler   Past Medical History  Diagnosis Date  . Hypokalemia   . Narcotic abuse     5 yrs ago lortab, off few yrs  . Anxiety   . Chronic pain   . Endometriosis     Past Surgical History  Procedure Date  . Tonsillectomy   . Abdominal hysterectomy     left ovary remains  . Cholecystectomy 2010  . Tubal ligation   . Carpal tunnel release     both    Family History  Problem Relation Age of Onset  . Stroke Father   . Diverticulitis Father     History  Substance Use Topics  . Smoking status: Current Every Day Smoker -- 1.5 packs/day for 18 years    Types: Cigarettes  . Smokeless tobacco: Not on file  . Alcohol Use: Yes     Comment: rarely    OB History    Grav Para Term Preterm Abortions TAB SAB Ect Mult Living   4 4 4       4       Review of Systems  Constitutional: Negative for fever.       10 Systems reviewed and are negative for acute change except as noted in the HPI.  HENT: Negative for congestion.   Eyes: Negative for discharge and redness.  Respiratory:  Negative for cough and shortness of breath.   Cardiovascular: Negative for chest pain.  Gastrointestinal: Positive for abdominal pain. Negative for vomiting.  Genitourinary: Positive for hematuria.  Musculoskeletal: Negative for back pain.  Skin: Negative for rash.  Neurological: Negative for syncope, numbness and headaches.  Psychiatric/Behavioral:       No behavior change.    Allergies  Fioricet-codeine; Ibuprofen; Ketorolac tromethamine; and Divalproex sodium  Home Medications   Current Outpatient Rx  Name  Route  Sig  Dispense  Refill  . ACETAMINOPHEN 500 MG PO TABS   Oral   Take 1,000 mg by mouth every 6 (six) hours as needed. Pain         . ALPRAZOLAM 1 MG PO TABS   Oral   Take 1 mg by mouth 4 (four) times daily as needed. Anxiety         . PENICILLIN V POTASSIUM 500 MG PO TABS   Oral   Take 1 tablet (500 mg total) by mouth 4 (four) times daily.   40 tablet   0   . TRAMADOL HCL 50 MG PO TABS   Oral   Take 1 tablet (50 mg total) by mouth every 6 (six) hours as needed for pain.  15 tablet   0     BP 101/51  Pulse 69  Temp 98.2 F (36.8 C) (Oral)  Resp 22  Ht 5\' 1"  (1.549 m)  Wt 120 lb (54.432 kg)  BMI 22.67 kg/m2  SpO2 100%  Physical Exam  Nursing note and vitals reviewed. Constitutional: She appears well-developed and well-nourished.       Awake, alert, nontoxic appearance.  HENT:  Head: Atraumatic.  Eyes: Right eye exhibits no discharge. Left eye exhibits no discharge.  Neck: Neck supple.  Cardiovascular: Normal heart sounds.   Pulmonary/Chest: Effort normal and breath sounds normal. She exhibits no tenderness.  Abdominal: Soft. There is tenderness. There is no rebound.       Lower abdominal tenderness without focal tenderness.  Musculoskeletal: She exhibits no tenderness.       Baseline ROM, no obvious new focal weakness.  Neurological:       Mental status and motor strength appears baseline for patient and situation.  Skin: No rash noted.   Psychiatric: She has a normal mood and affect.    ED Course  Procedures (including critical care time) Results for orders placed during the hospital encounter of 02/28/12  URINALYSIS, ROUTINE W REFLEX MICROSCOPIC      Component Value Range   Color, Urine YELLOW  YELLOW   APPearance CLEAR  CLEAR   Specific Gravity, Urine 1.010  1.005 - 1.030   pH 6.5  5.0 - 8.0   Glucose, UA NEGATIVE  NEGATIVE mg/dL   Hgb urine dipstick NEGATIVE  NEGATIVE   Bilirubin Urine NEGATIVE  NEGATIVE   Ketones, ur NEGATIVE  NEGATIVE mg/dL   Protein, ur NEGATIVE  NEGATIVE mg/dL   Urobilinogen, UA 0.2  0.0 - 1.0 mg/dL   Nitrite NEGATIVE  NEGATIVE   Leukocytes, UA NEGATIVE  NEGATIVE    No results found.   1. Abdominal pain     0124 Patient had advised nursing she could not take tylenol, "it upsets my stomach". Ordered naprosyn which she informed nursing also "upsets my stomach". She requested a "shot". I spoke with the patient and advised that for the current complaint there was no indication for a narcotic. We would await urine. 1610 Reviewed urine result with patient. She states her pain is 10/10. Offered tramadol which she refused. Once again asked for narcotic shot. I declined.   MDM  Patient presents with c/o  hematuria and lower abdominal pain. Urine is clear and normal. Patient refused the antiinflammatories offered for pain management. She is scheduled to be seen by OB/GYN and GI the first week of January. Pt stable in ED with no significant deterioration in condition.The patient appears reasonably screened and/or stabilized for discharge and I doubt any other medical condition or other [facility] requiring further screening, evaluation, or treatment in the ED at this time prior to discharge.  MDM Reviewed: nursing note, vitals and previous chart Reviewed previous: labs, CT scan and x-ray Interpretation: labs           Nicoletta Dress. Colon Branch, MD 02/28/12 [phone]

## 2012-02-29 ENCOUNTER — Encounter (HOSPITAL_COMMUNITY): Payer: Self-pay | Admitting: Emergency Medicine

## 2012-02-29 ENCOUNTER — Emergency Department (HOSPITAL_COMMUNITY)
Admission: EM | Admit: 2012-02-29 | Discharge: 2012-02-29 | Disposition: A | Discharge status: Home / Self Care | Payer: BC Managed Care – PPO | Attending: Emergency Medicine | Admitting: Emergency Medicine

## 2012-02-29 DIAGNOSIS — Z8742 Personal history of other diseases of the female genital tract: Secondary | ICD-10-CM | POA: Insufficient documentation

## 2012-02-29 DIAGNOSIS — Z90710 Acquired absence of both cervix and uterus: Secondary | ICD-10-CM | POA: Insufficient documentation

## 2012-02-29 DIAGNOSIS — F411 Generalized anxiety disorder: Secondary | ICD-10-CM | POA: Insufficient documentation

## 2012-02-29 DIAGNOSIS — Z87448 Personal history of other diseases of urinary system: Secondary | ICD-10-CM | POA: Insufficient documentation

## 2012-02-29 DIAGNOSIS — F17200 Nicotine dependence, unspecified, uncomplicated: Secondary | ICD-10-CM | POA: Insufficient documentation

## 2012-02-29 DIAGNOSIS — R109 Unspecified abdominal pain: Secondary | ICD-10-CM | POA: Insufficient documentation

## 2012-02-29 DIAGNOSIS — G8929 Other chronic pain: Secondary | ICD-10-CM | POA: Insufficient documentation

## 2012-02-29 DIAGNOSIS — Z79899 Other long term (current) drug therapy: Secondary | ICD-10-CM | POA: Insufficient documentation

## 2012-02-29 DIAGNOSIS — Z7982 Long term (current) use of aspirin: Secondary | ICD-10-CM | POA: Insufficient documentation

## 2012-02-29 DIAGNOSIS — F1410 Cocaine abuse, uncomplicated: Secondary | ICD-10-CM | POA: Insufficient documentation

## 2012-02-29 DIAGNOSIS — R110 Nausea: Secondary | ICD-10-CM | POA: Insufficient documentation

## 2012-02-29 HISTORY — DX: Disorder of kidney and ureter, unspecified: N28.9

## 2012-02-29 MED ORDER — ONDANSETRON 4 MG PO TBDP
4.0000 mg | ORAL_TABLET | Freq: Once | ORAL | Status: AC
  Administered 2012-02-29: 4 mg via ORAL
  Filled 2012-02-29: qty 1

## 2012-02-29 MED ORDER — TRAMADOL HCL 50 MG PO TABS
50.0000 mg | ORAL_TABLET | Freq: Once | ORAL | Status: AC
  Administered 2012-02-29: 50 mg via ORAL
  Filled 2012-02-29: qty 1

## 2012-02-29 MED ORDER — ONDANSETRON 4 MG PO TBDP: 4.0000 mg | ORAL_TABLET | Freq: Three times a day (TID) | ORAL | Status: DC | PRN

## 2012-02-29 MED ORDER — TRAMADOL HCL 50 MG PO TABS: 50.0000 mg | ORAL_TABLET | Freq: Four times a day (QID) | ORAL | Status: DC | PRN

## 2012-02-29 NOTE — ED Notes (Signed)
Pt c/o left lower abdominal pain that radiates to low back area. Pt has history of same x 2 months. Pt has appointment with GI Dr on the 6th and ob/gyn Dr on the 2nd. Pt reports pain worse than usual. Pt has history of cyst on ovary and kidney.

## 2012-02-29 NOTE — ED Provider Notes (Signed)
History     CSN: [id number]  Arrival date & time 02/29/12  1659   First MD Initiated Contact with Patient 02/29/12 1702      Chief Complaint  Patient presents with  . Abdominal Pain    (Consider location/radiation/quality/duration/timing/severity/associated sxs/prior treatment) HPI Pt with multiple presentations to different ED's this month most recently yesterday for same complaint of lower abd pain. Pain is unchanged. She has had multiple imaging studies without cause being identified. According to several ED MD notes pt demonstrates drug seeking behavior. She denies fever, or chills. +nausea. No vaginal or urinary complaints.  Past Medical History  Diagnosis Date  . Hypokalemia   . Narcotic abuse     5 yrs ago lortab, off few yrs  . Anxiety   . Chronic pain   . Endometriosis   . Renal disorder Cyst on Kidney    Past Surgical History  Procedure Date  . Tonsillectomy   . Cholecystectomy 2010  . Tubal ligation   . Carpal tunnel release     both  . Abdominal hysterectomy     left ovary remains    Family History  Problem Relation Age of Onset  . Stroke Father   . Diverticulitis Father     History  Substance Use Topics  . Smoking status: Current Every Day Smoker -- 1.5 packs/day for 18 years    Types: Cigarettes  . Smokeless tobacco: Not on file  . Alcohol Use: Yes     Comment: rarely    OB History    Grav Para Term Preterm Abortions TAB SAB Ect Mult Living   4 4 4       4       Review of Systems  Constitutional: Negative for fever and chills.  Gastrointestinal: Positive for nausea and abdominal pain. Negative for diarrhea, constipation and anal bleeding.  Genitourinary: Negative for dysuria, hematuria, flank pain, vaginal bleeding, vaginal discharge and vaginal pain.  All other systems reviewed and are negative.    Allergies  Fioricet-codeine; Ibuprofen; Ketorolac tromethamine; and Divalproex sodium  Home Medications   Current Outpatient Rx   Name  Route  Sig  Dispense  Refill  . ALPRAZOLAM 1 MG PO TABS   Oral   Take 1 mg by mouth 4 (four) times daily as needed. Anxiety         . AMOXICILLIN 500 MG PO CAPS   Oral   Take 500 mg by mouth 3 (three) times daily.         . ASPIRIN EC 81 MG PO TBEC   Oral   Take 81 mg by mouth once.         . TRAMADOL-ACETAMINOPHEN 37.5-325 MG PO TABS   Oral   Take 2 tablets by mouth every 6 (six) hours as needed. For pain         . ONDANSETRON 4 MG PO TBDP   Oral   Take 1 tablet (4 mg total) by mouth every 8 (eight) hours as needed for nausea.   10 tablet   0   . PENICILLIN V POTASSIUM 500 MG PO TABS   Oral   Take 1 tablet (500 mg total) by mouth 4 (four) times daily.   40 tablet   0   . TRAMADOL HCL 50 MG PO TABS   Oral   Take 1 tablet (50 mg total) by mouth every 6 (six) hours as needed for pain.   15 tablet   0     BP 103/50  Pulse 78  Temp 98 F (36.7 C) (Oral)  Resp 20  SpO2 99%  Physical Exam  Nursing note and vitals reviewed. Constitutional: She is oriented to person, place, and time. She appears well-developed and well-nourished. No distress.  HENT:  Head: Normocephalic and atraumatic.  Mouth/Throat: Oropharynx is clear and moist.  Eyes: EOM are normal. Pupils are equal, round, and reactive to light.  Neck: Normal range of motion. Neck supple.  Cardiovascular: Normal rate and regular rhythm.   Pulmonary/Chest: Effort normal and breath sounds normal. No respiratory distress. She has no wheezes. She has no rales.  Abdominal: Soft. Bowel sounds are normal. She exhibits no distension and no mass. There is tenderness (Mild generalized tenderness worse in lower abd. no rebound or guarding. ). There is no rebound and no guarding.  Musculoskeletal: Normal range of motion. She exhibits no edema and no tenderness.  Neurological: She is alert and oriented to person, place, and time.  Skin: Skin is warm and dry. No rash noted. No erythema.  Psychiatric: She has a  normal mood and affect. Her behavior is normal.    ED Course  Procedures (including critical care time)  Labs Reviewed - No data to display No results found.   1. Chronic abdominal pain       MDM   I have informed her she will not receive narcotics. She is well appearing, well hydrated and has a benign exam. Her VS are within normal limits. She is advised to f/u with her specialist. I do not see the benefit in repeating any lab or imaging test given recent negative results without change in symptoms.         Loren Racer, MD 02/29/12 [phone]

## 2012-03-08 ENCOUNTER — Encounter (HOSPITAL_COMMUNITY): Payer: Self-pay

## 2012-03-08 ENCOUNTER — Emergency Department (HOSPITAL_COMMUNITY)
Admission: EM | Admit: 2012-03-08 | Discharge: 2012-03-08 | Disposition: A | Discharge status: Home / Self Care | Payer: BC Managed Care – PPO | Attending: Emergency Medicine | Admitting: Emergency Medicine

## 2012-03-08 DIAGNOSIS — J029 Acute pharyngitis, unspecified: Secondary | ICD-10-CM | POA: Insufficient documentation

## 2012-03-08 DIAGNOSIS — G8929 Other chronic pain: Secondary | ICD-10-CM | POA: Insufficient documentation

## 2012-03-08 DIAGNOSIS — J3489 Other specified disorders of nose and nasal sinuses: Secondary | ICD-10-CM | POA: Insufficient documentation

## 2012-03-08 DIAGNOSIS — Z79899 Other long term (current) drug therapy: Secondary | ICD-10-CM | POA: Insufficient documentation

## 2012-03-08 DIAGNOSIS — J069 Acute upper respiratory infection, unspecified: Secondary | ICD-10-CM | POA: Insufficient documentation

## 2012-03-08 DIAGNOSIS — Z8742 Personal history of other diseases of the female genital tract: Secondary | ICD-10-CM | POA: Insufficient documentation

## 2012-03-08 DIAGNOSIS — J329 Chronic sinusitis, unspecified: Secondary | ICD-10-CM | POA: Insufficient documentation

## 2012-03-08 DIAGNOSIS — F411 Generalized anxiety disorder: Secondary | ICD-10-CM | POA: Insufficient documentation

## 2012-03-08 DIAGNOSIS — F17200 Nicotine dependence, unspecified, uncomplicated: Secondary | ICD-10-CM | POA: Insufficient documentation

## 2012-03-08 DIAGNOSIS — R509 Fever, unspecified: Secondary | ICD-10-CM | POA: Insufficient documentation

## 2012-03-08 DIAGNOSIS — Z87448 Personal history of other diseases of urinary system: Secondary | ICD-10-CM | POA: Insufficient documentation

## 2012-03-08 DIAGNOSIS — E876 Hypokalemia: Secondary | ICD-10-CM | POA: Insufficient documentation

## 2012-03-08 DIAGNOSIS — F1111 Opioid abuse, in remission: Secondary | ICD-10-CM | POA: Insufficient documentation

## 2012-03-08 MED ORDER — HYDROCODONE-ACETAMINOPHEN 5-325 MG PO TABS: 1.0000 | ORAL_TABLET | ORAL | Status: DC | PRN

## 2012-03-08 MED ORDER — PHENYLEPHRINE-CHLORPHEN-DM 12.5-4-15 MG/5ML PO SYRP: 5.0000 mL | ORAL_SOLUTION | ORAL | Status: DC

## 2012-03-08 NOTE — ED Notes (Signed)
Pt c/o bilateral ear pain since Friday with pressure.

## 2012-03-08 NOTE — ED Notes (Signed)
Bilateral earache, with sore throat , sl cough,  fever101 earlier today. Took motrin

## 2012-03-10 NOTE — ED Provider Notes (Signed)
History     CSN: [id number]  Arrival date & time 03/08/12  1611   First MD Initiated Contact with Patient 03/08/12 1820      Chief Complaint  Patient presents with  . Otalgia    (Consider location/radiation/quality/duration/timing/severity/associated sxs/prior treatment) Patient is a 32 y.o. female presenting with ear pain. The history is provided by the patient.  Otalgia This is a new problem. The current episode started more than 2 days ago. There is pain in both ears. The problem occurs constantly. The problem has not changed since onset.There has been no fever. The pain is moderate. Associated symptoms include sore throat. Pertinent negatives include no abdominal pain, no neck pain and no cough. Her past medical history does not include chronic ear infection.    Past Medical History  Diagnosis Date  . Hypokalemia   . Narcotic abuse     5 yrs ago lortab, off few yrs  . Anxiety   . Chronic pain   . Endometriosis   . Renal disorder Cyst on Kidney    Past Surgical History  Procedure Date  . Tonsillectomy   . Cholecystectomy 2010  . Tubal ligation   . Carpal tunnel release     both  . Abdominal hysterectomy     left ovary remains    Family History  Problem Relation Age of Onset  . Stroke Father   . Diverticulitis Father     History  Substance Use Topics  . Smoking status: Current Every Day Smoker -- 1.5 packs/day for 18 years    Types: Cigarettes  . Smokeless tobacco: Not on file  . Alcohol Use: Yes     Comment: rarely    OB History    Grav Para Term Preterm Abortions TAB SAB Ect Mult Living   4 4 4       4       Review of Systems  Constitutional: Positive for fever and chills. Negative for activity change.       All ROS Neg except as noted in HPI  HENT: Positive for ear pain, congestion and sore throat. Negative for nosebleeds and neck pain.   Eyes: Negative for photophobia and discharge.  Respiratory: Negative for cough, shortness of breath and  wheezing.   Cardiovascular: Negative for chest pain and palpitations.  Gastrointestinal: Negative for abdominal pain and blood in stool.  Genitourinary: Negative for dysuria, frequency and hematuria.  Musculoskeletal: Negative for back pain and arthralgias.  Skin: Negative.   Neurological: Negative for dizziness, seizures and speech difficulty.  Psychiatric/Behavioral: Negative for hallucinations and confusion.    Allergies  Fioricet-codeine; Ibuprofen; Ketorolac tromethamine; and Divalproex sodium  Home Medications   Current Outpatient Rx  Name  Route  Sig  Dispense  Refill  . ALPRAZOLAM 1 MG PO TABS   Oral   Take 1 mg by mouth 4 (four) times daily as needed. Anxiety         . AMOXICILLIN 500 MG PO CAPS   Oral   Take 500 mg by mouth 3 (three) times daily.         . CHLORPHEN-PE-ACETAMINOPHEN 2-5-325 MG PO TABS   Oral   Take 1-2 tablets by mouth daily as needed. For cold/pain symptoms         . PHENYLEPHRINE-CHLORPHEN-DM 12.07-11-13 MG/5ML PO SYRP   Oral   Take 5 mLs by mouth every 4 (four) hours.   100 mL   0   . HYDROCODONE-ACETAMINOPHEN 5-325 MG PO TABS   Oral  Take 1 tablet by mouth every 4 (four) hours as needed for pain.   15 tablet   0   . ONDANSETRON 4 MG PO TBDP   Oral   Take 1 tablet (4 mg total) by mouth every 8 (eight) hours as needed for nausea.   10 tablet   0   . TRAMADOL HCL 50 MG PO TABS   Oral   Take 1 tablet (50 mg total) by mouth every 6 (six) hours as needed for pain.   15 tablet   0   . TRAMADOL-ACETAMINOPHEN 37.5-325 MG PO TABS   Oral   Take 2 tablets by mouth every 6 (six) hours as needed. For pain           BP 102/85  Pulse 96  Temp 98.3 F (36.8 C)  Resp 16  Ht 5\' 1"  (1.549 m)  Wt 120 lb (54.432 kg)  BMI 22.67 kg/m2  SpO2 100%  Physical Exam  Nursing note and vitals reviewed. Constitutional: She is oriented to person, place, and time. She appears well-developed and well-nourished.  Non-toxic appearance.  HENT:    Head: Normocephalic.  Right Ear: Tympanic membrane and external ear normal.  Left Ear: Tympanic membrane and external ear normal.       Nasal congestion. Soreness to percussion over the sinuses.  Eyes: EOM and lids are normal. Pupils are equal, round, and reactive to light.  Neck: Normal range of motion. Neck supple. Carotid bruit is not present.  Cardiovascular: Normal rate, regular rhythm, normal heart sounds, intact distal pulses and normal pulses.   Pulmonary/Chest: Breath sounds normal. No respiratory distress.  Abdominal: Soft. Bowel sounds are normal. There is no tenderness. There is no guarding.  Musculoskeletal: Normal range of motion.  Lymphadenopathy:       Head (right side): No submandibular adenopathy present.       Head (left side): No submandibular adenopathy present.    She has no cervical adenopathy.  Neurological: She is alert and oriented to person, place, and time. She has normal strength. No cranial nerve deficit or sensory deficit.  Skin: Skin is warm and dry.  Psychiatric: She has a normal mood and affect. Her speech is normal.    ED Course  Procedures (including critical care time)  Labs Reviewed - No data to display No results found.   1. Sinusitis   2. URI (upper respiratory infection)       MDM  I have reviewed nursing notes, vital signs, and all appropriate lab and imaging results for this patient. Exam is consistent with sinusitis and upper respiratory infection. The patient is treated with pheniramine, Norco, and advised to use Tylenol or ibuprofen for mild soreness. Patient is to increase fluids and wash hands frequently. Patient is to see her primary physician for additional evaluation and followup.       Kathie Dike, [place] 03/10/12 Kristopher Oppenheim

## 2012-03-10 NOTE — ED Provider Notes (Signed)
Medical screening examination/treatment/procedure(s) were performed by non-physician practitioner and as supervising physician I was immediately available for consultation/collaboration.   Lyanne Co, MD 03/10/12 2012

## 2012-03-17 ENCOUNTER — Encounter: Payer: Self-pay | Admitting: Orthopedic Surgery

## 2012-03-17 ENCOUNTER — Ambulatory Visit (INDEPENDENT_AMBULATORY_CARE_PROVIDER_SITE_OTHER): Payer: BC Managed Care – PPO

## 2012-03-17 ENCOUNTER — Ambulatory Visit (INDEPENDENT_AMBULATORY_CARE_PROVIDER_SITE_OTHER): Payer: BC Managed Care – PPO | Admitting: Orthopedic Surgery

## 2012-03-17 VITALS — BP 100/58 | Ht 61.0 in | Wt 126.0 lb

## 2012-03-17 DIAGNOSIS — M719 Bursopathy, unspecified: Secondary | ICD-10-CM

## 2012-03-17 DIAGNOSIS — M75102 Unspecified rotator cuff tear or rupture of left shoulder, not specified as traumatic: Secondary | ICD-10-CM

## 2012-03-17 DIAGNOSIS — M25519 Pain in unspecified shoulder: Secondary | ICD-10-CM

## 2012-03-17 DIAGNOSIS — M67919 Unspecified disorder of synovium and tendon, unspecified shoulder: Secondary | ICD-10-CM

## 2012-03-17 MED ORDER — HYDROCODONE-ACETAMINOPHEN 5-325 MG PO TABS: 1.0000 | ORAL_TABLET | ORAL | Status: DC | PRN

## 2012-03-17 NOTE — Progress Notes (Signed)
Patient ID: [name], female   DOB: 03-03-1981, 32 y.o.   MRN: [id number] Chief Complaint  Patient presents with  . Shoulder Pain    Left shoulder pain started 2 months ago no injury   Referral from [facility]  She complains of a gradual onset of throbbing constant LEFT shoulder pain, which occurs morning and night, improve with a heating pad, and oral pain medication, worse with cold weather. She also complains of locking, catching, and swelling. She also complains of back pain and sore joints. She denies any previous treatment. She is on tramadol and also has taken some hydrocodone.  Review of systems heart palpitations, fainting, murmurs or anxiety seasonal allergies.  Physical Exam  Ortho Exam

## 2012-03-17 NOTE — Patient Instructions (Addendum)
PROBLEM # 1 SORE JOINTS SEE YOUR DR AT [facility]  PROBLEM # 2 BACK PAIN SEE CHIROPRACTOR OF YOUR CHOICE  PROBLEM # 3 LEFT SHOULDER PAIN: DIAGNOSIS IS: BURSITIS You have received a steroid shot. 15% of patients experience increased pain at the injection site with in the next 24 hours. This is best treated with ice and tylenol extra strength 2 tabs every 8 hours. If you are still having pain please call the office.   CONTINUE TRAMADOL   Impingement Syndrome, Rotator Cuff, Bursitis with Rehab Impingement syndrome is a condition that involves inflammation of the tendons of the rotator cuff and the subacromial bursa, that causes pain in the shoulder. The rotator cuff consists of four tendons and muscles that control much of the shoulder and upper arm function. The subacromial bursa is a fluid filled sac that helps reduce friction between the rotator cuff and one of the bones of the shoulder (acromion). Impingement syndrome is usually an overuse injury that causes swelling of the bursa (bursitis), swelling of the tendon (tendonitis), and/or a tear of the tendon (strain). Strains are classified into three categories. Grade 1 strains cause pain, but the tendon is not lengthened. Grade 2 strains include a lengthened ligament, due to the ligament being stretched or partially ruptured. With grade 2 strains there is still function, although the function may be decreased. Grade 3 strains include a complete tear of the tendon or muscle, and function is usually impaired. SYMPTOMS    Pain around the shoulder, often at the outer portion of the upper arm.   Pain that gets worse with shoulder function, especially when reaching overhead or lifting.   Sometimes, aching when not using the arm.   Pain that wakes you up at night.   Sometimes, tenderness, swelling, warmth, or redness over the affected area.   Loss of strength.   Limited motion of the shoulder, especially reaching behind the back (to the back pocket  or to unhook bra) or across your body.   Crackling sound (crepitation) when moving the arm.   Biceps tendon pain and inflammation (in the front of the shoulder). Worse when bending the elbow or lifting.  CAUSES   Impingement syndrome is often an overuse injury, in which chronic (repetitive) motions cause the tendons or bursa to become inflamed. A strain occurs when a force is paced on the tendon or muscle that is greater than it can withstand. Common mechanisms of injury include: Stress from sudden increase in duration, frequency, or intensity of training.  Direct hit (trauma) to the shoulder.   Aging, erosion of the tendon with normal use.   Bony bump on shoulder (acromial spur).  RISK INCREASES WITH:  Contact sports (football, wrestling, boxing).   Throwing sports (baseball, tennis, volleyball).   Weightlifting and bodybuilding.   Heavy labor.   Previous injury to the rotator cuff, including impingement.   Poor shoulder strength and flexibility.   Failure to warm up properly before activity.   Inadequate protective equipment.   Old age.   Bony bump on shoulder (acromial spur).  PREVENTION    Warm up and stretch properly before activity.   Allow for adequate recovery between workouts.   Maintain physical fitness:   Strength, flexibility, and endurance.   Cardiovascular fitness.   Learn and use proper exercise technique.  PROGNOSIS   If treated properly, impingement syndrome usually goes away within 6 weeks. Sometimes surgery is required.   RELATED COMPLICATIONS    Longer healing time if not properly  treated, or if not given enough time to heal.   Recurring symptoms, that result in a chronic condition.   Shoulder stiffness, frozen shoulder, or loss of motion.   Rotator cuff tendon tear.   Recurring symptoms, especially if activity is resumed too soon, with overuse, with a direct blow, or when using poor technique.  TREATMENT   Treatment first involves the  use of ice and medicine, to reduce pain and inflammation. The use of strengthening and stretching exercises may help reduce pain with activity. These exercises may be performed at home or with a therapist.  If pain medicine is needed, nonsteroidal anti-inflammatory medicines (aspirin and ibuprofen), or other minor pain relievers (acetaminophen), are often advised.   Do not take pain medicine for 7 days before surgery.   Corticosteroid injections may be given by your caregiver. These injections should be reserved for the most serious cases, because they may only be given a certain number of times.  HEAT AND COLD  Cold treatment (icing) should be applied for 10 to 15 minutes every 2 to 3 hours for inflammation and pain, and immediately after activity that aggravates your symptoms. Use ice packs or an ice massage.   Heat treatment may be used before performing stretching and strengthening activities prescribed by your caregiver, physical therapist, or athletic trainer. Use a heat pack or a warm water soak.

## 2012-05-18 ENCOUNTER — Emergency Department (HOSPITAL_COMMUNITY)
Admission: EM | Admit: 2012-05-18 | Discharge: 2012-05-18 | Disposition: A | Discharge status: Home / Self Care | Payer: BC Managed Care – PPO | Attending: Emergency Medicine | Admitting: Emergency Medicine

## 2012-05-18 ENCOUNTER — Encounter (HOSPITAL_COMMUNITY): Payer: Self-pay | Admitting: Emergency Medicine

## 2012-05-18 ENCOUNTER — Encounter (HOSPITAL_COMMUNITY): Payer: Self-pay | Admitting: *Deleted

## 2012-05-18 DIAGNOSIS — F17200 Nicotine dependence, unspecified, uncomplicated: Secondary | ICD-10-CM | POA: Insufficient documentation

## 2012-05-18 DIAGNOSIS — Y93E5 Activity, floor mopping and cleaning: Secondary | ICD-10-CM | POA: Insufficient documentation

## 2012-05-18 DIAGNOSIS — T24019A Burn of unspecified degree of unspecified thigh, initial encounter: Secondary | ICD-10-CM | POA: Insufficient documentation

## 2012-05-18 DIAGNOSIS — G8929 Other chronic pain: Secondary | ICD-10-CM | POA: Insufficient documentation

## 2012-05-18 DIAGNOSIS — Z8639 Personal history of other endocrine, nutritional and metabolic disease: Secondary | ICD-10-CM | POA: Insufficient documentation

## 2012-05-18 DIAGNOSIS — F411 Generalized anxiety disorder: Secondary | ICD-10-CM | POA: Insufficient documentation

## 2012-05-18 DIAGNOSIS — Z8742 Personal history of other diseases of the female genital tract: Secondary | ICD-10-CM | POA: Insufficient documentation

## 2012-05-18 DIAGNOSIS — Z87448 Personal history of other diseases of urinary system: Secondary | ICD-10-CM | POA: Insufficient documentation

## 2012-05-18 DIAGNOSIS — Y92009 Unspecified place in unspecified non-institutional (private) residence as the place of occurrence of the external cause: Secondary | ICD-10-CM | POA: Insufficient documentation

## 2012-05-18 DIAGNOSIS — X19XXXA Contact with other heat and hot substances, initial encounter: Secondary | ICD-10-CM | POA: Insufficient documentation

## 2012-05-18 DIAGNOSIS — G8911 Acute pain due to trauma: Secondary | ICD-10-CM | POA: Insufficient documentation

## 2012-05-18 DIAGNOSIS — Z862 Personal history of diseases of the blood and blood-forming organs and certain disorders involving the immune mechanism: Secondary | ICD-10-CM | POA: Insufficient documentation

## 2012-05-18 DIAGNOSIS — Z79899 Other long term (current) drug therapy: Secondary | ICD-10-CM | POA: Insufficient documentation

## 2012-05-18 DIAGNOSIS — M79609 Pain in unspecified limb: Secondary | ICD-10-CM | POA: Insufficient documentation

## 2012-05-18 DIAGNOSIS — T300 Burn of unspecified body region, unspecified degree: Secondary | ICD-10-CM

## 2012-05-18 DIAGNOSIS — Z09 Encounter for follow-up examination after completed treatment for conditions other than malignant neoplasm: Secondary | ICD-10-CM

## 2012-05-18 MED ORDER — LIDOCAINE VISCOUS 2 % MT SOLN: 20.0000 mL | Freq: Once | OROMUCOSAL | Status: DC

## 2012-05-18 MED ORDER — LIDOCAINE VISCOUS 2 % MT SOLN
OROMUCOSAL | Status: AC
  Administered 2012-05-18: 18:00:00
  Filled 2012-05-18: qty 15

## 2012-05-18 MED ORDER — OXYCODONE-ACETAMINOPHEN 5-325 MG PO TABS
1.0000 | ORAL_TABLET | Freq: Once | ORAL | Status: AC
  Administered 2012-05-18: 1 via ORAL
  Filled 2012-05-18: qty 1
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
## 2012-05-18 NOTE — Discharge Instructions (Signed)
Burn Care  Your skin is a natural barrier to infection. It is the largest organ of your body. Burns damage this natural protection. To help prevent infection, it is very important to follow your caregiver's instructions in the care of your burn.  Burns are classified as:  · First degree. There is only redness of the skin (erythema). No scarring is expected.  · Second degree. There is blistering of the skin. Scarring may occur with deeper burns.  · Third degree. All layers of the skin are injured, and scarring is expected.  HOME CARE INSTRUCTIONS   · Wash your hands well before changing your bandage.  · Change your bandage as often as directed by your caregiver.  · Remove the old bandage. If the bandage sticks, you may soak it off with cool, clean water.  · Cleanse the burn thoroughly but gently with mild soap and water.  · Pat the area dry with a clean, dry cloth.  · Apply a thin layer of antibacterial cream to the burn.  · Apply a clean bandage as instructed by your caregiver.  · Keep the bandage as clean and dry as possible.  · Elevate the affected area for the first 24 hours, then as instructed by your caregiver.  · Only take over-the-counter or prescription medicines for pain, discomfort, or fever as directed by your caregiver.  SEEK IMMEDIATE MEDICAL CARE IF:   · You develop excessive pain.  · You develop redness, tenderness, swelling, or red streaks near the burn.  · The burned area develops yellowish-white fluid (pus) or a bad smell.  · You have a fever.  MAKE SURE YOU:   · Understand these instructions.  · Will watch your condition.  · Will get help right away if you are not doing well or get worse.  Document Released: 02/24/2005 Document Revised: 05/19/2011 Document Reviewed: 07/17/2010  [employer]® Patient Information ©2013 [employer].
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
## 2012-05-23 NOTE — ED Provider Notes (Signed)
Medical screening examination/treatment/procedure(s) were performed by non-physician practitioner and as supervising physician I was immediately available for consultation/collaboration.   Kahealani Yankovich, MD 05/23/12 0648 

## 2012-06-12 ENCOUNTER — Emergency Department (HOSPITAL_COMMUNITY)
Admission: EM | Admit: 2012-06-12 | Discharge: 2012-06-12 | Disposition: A | Discharge status: Home / Self Care | Payer: BC Managed Care – PPO | Attending: Emergency Medicine | Admitting: Emergency Medicine

## 2012-06-12 ENCOUNTER — Encounter (HOSPITAL_COMMUNITY): Payer: Self-pay

## 2012-06-12 DIAGNOSIS — R599 Enlarged lymph nodes, unspecified: Secondary | ICD-10-CM | POA: Insufficient documentation

## 2012-06-12 DIAGNOSIS — K089 Disorder of teeth and supporting structures, unspecified: Secondary | ICD-10-CM | POA: Insufficient documentation

## 2012-06-12 DIAGNOSIS — Z87448 Personal history of other diseases of urinary system: Secondary | ICD-10-CM | POA: Insufficient documentation

## 2012-06-12 DIAGNOSIS — F411 Generalized anxiety disorder: Secondary | ICD-10-CM | POA: Insufficient documentation

## 2012-06-12 DIAGNOSIS — K029 Dental caries, unspecified: Secondary | ICD-10-CM | POA: Insufficient documentation

## 2012-06-12 DIAGNOSIS — Z8639 Personal history of other endocrine, nutritional and metabolic disease: Secondary | ICD-10-CM | POA: Insufficient documentation

## 2012-06-12 DIAGNOSIS — Z8742 Personal history of other diseases of the female genital tract: Secondary | ICD-10-CM | POA: Insufficient documentation

## 2012-06-12 DIAGNOSIS — F17200 Nicotine dependence, unspecified, uncomplicated: Secondary | ICD-10-CM | POA: Insufficient documentation

## 2012-06-12 DIAGNOSIS — Z862 Personal history of diseases of the blood and blood-forming organs and certain disorders involving the immune mechanism: Secondary | ICD-10-CM | POA: Insufficient documentation

## 2012-06-12 MED ORDER — PENICILLIN V POTASSIUM 500 MG PO TABS: 500.0000 mg | ORAL_TABLET | Freq: Three times a day (TID) | ORAL | Status: AC

## 2012-06-12 MED ORDER — HYDROCODONE-ACETAMINOPHEN 5-325 MG PO TABS: 1.0000 | ORAL_TABLET | ORAL | Status: DC | PRN

## 2012-06-12 NOTE — ED Notes (Signed)
Pt reports dental pain, h/o chronic dental issues.

## 2012-06-12 NOTE — ED Provider Notes (Signed)
History     CSN: [id number]  Arrival date & time 06/12/12  1123   First MD Initiated Contact with Patient 06/12/12 1142      Chief Complaint  Patient presents with  . Dental Pain    (Consider location/radiation/quality/duration/timing/severity/associated sxs/prior treatment) HPI [name] is a 32 y.o. female who presents to the ED with dental pain. The pain started 2 days ago. She describes the pain as throbbing. The pain is located in the left lowe dental area. She rates the pain as 8/10. She has had chronic dental problems. She went to a dentist and started having work done and had a follow up appointment on 03/09/2024but her father died and was buried on the Jul 17, 2022 and she forgot to call and cancel so they will not see her again. She is trying to find another dentist but does not have one yet. The history was provided by the patient.  Past Medical History  Diagnosis Date  . Hypokalemia   . Narcotic abuse     5 yrs ago lortab, off few yrs  . Anxiety   . Chronic pain   . Endometriosis   . 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 Tonsillectomy    . Cholecystectomy  2010  . Tubal ligation    . Carpal tunnel release      both  . Abdominal hysterectomy      left ovary remains    Family History  Problem Relation Age of Onset  . Stroke Father   . Diverticulitis Father   . Heart disease    . Arthritis    . Diabetes    . Kidney disease      History  Substance Use Topics  . Smoking status: Current Every Day Smoker -- 1.50 packs/day for 18 years    Types: Cigarettes  . Smokeless tobacco: Not on file  . Alcohol Use: Yes     Comment: rarely    OB History   Grav Para Term Preterm Abortions TAB SAB Ect Mult Living   4 4 4       4       Review of Systems  Constitutional: Negative for fever and chills.  HENT: Positive for dental problem. Negative for facial swelling, trouble swallowing and neck pain.   Respiratory: Negative  for shortness of breath.   Gastrointestinal: Negative for nausea and vomiting.  Musculoskeletal: Negative for myalgias.  Skin: Negative for rash.  Neurological: Negative for headaches.  Psychiatric/Behavioral: Negative for confusion. The patient is not nervous/anxious.     Allergies  Fioricet-codeine; Ibuprofen; Ketorolac tromethamine; and Divalproex sodium  Home Medications   Current Outpatient Rx  Name  Route  Sig  Dispense  Refill  . ALPRAZolam (XANAX) 1 MG tablet   Oral   Take 1 mg by mouth 3 (three) times daily as needed for anxiety. Anxiety         . benzocaine (ORAJEL) 10 % mucosal gel   Mouth/Throat   Use as directed 1 application in the mouth or throat as needed for pain.         . diphenhydrAMINE (BENADRYL) 25 mg capsule   Oral   Take 25-50 mg by mouth every 6 (six) hours as needed for allergies.           BP 130/99  Pulse 100  Temp(Src) 98.3 F (36.8 C) (Oral)  Resp 20  Ht 5\' 1"  (1.549 m)  Wt 120 lb (54.432 kg)  BMI 22.69 kg/m2  SpO2 100%  Physical Exam  Nursing note and vitals reviewed. Constitutional: She is oriented to person, place, and time. She appears well-developed and well-nourished. No distress.  HENT:  Head: Normocephalic.  Right Ear: Tympanic membrane normal.  Left Ear: Tympanic membrane normal.  Mouth/Throat: Uvula is midline, oropharynx is clear and moist and mucous membranes are normal. Dental caries present.    Eyes: EOM are normal.  Neck: Neck supple.  Cardiovascular: Normal rate.   Pulmonary/Chest: Effort normal.  Abdominal: Soft. There is no tenderness.  Musculoskeletal: Normal range of motion.  Lymphadenopathy:    She has cervical adenopathy.  Neurological: She is alert and oriented to person, place, and time. No cranial nerve deficit.  Skin: Skin is warm and dry.  Psychiatric: She has a normal mood and affect. Her behavior is normal. Judgment and thought content normal.    ED Course  Procedures (including critical  care time)   MDM  32 y.o. female with dental pain and dental caries. Will treat with antibiotics and pain medication. Discussed need for finding a new dentist to complete the work on her teeth as soon as possible. Patient voices understanding. She is stable for discharge home.    Medication List    TAKE these medications       HYDROcodone-acetaminophen 5-325 MG per tablet  Commonly known as:  NORCO/VICODIN  Take 1 tablet by mouth every 4 (four) hours as needed.     penicillin v potassium 500 MG tablet  Commonly known as:  VEETID  Take 1 tablet (500 mg total) by mouth 3 (three) times daily.      ASK your doctor about these medications       ALPRAZolam 1 MG tablet  Commonly known as:  XANAX  Take 1 mg by mouth 3 (three) times daily as needed for anxiety. Anxiety     benzocaine 10 % mucosal gel  Commonly known as:  ORAJEL  Use as directed 1 application in the mouth or throat as needed for pain.     diphenhydrAMINE 25 mg capsule  Commonly known as:  BENADRYL  Take 25-50 mg by mouth every 6 (six) hours as needed for allergies.               [facility] [name], [place] 06/12/12 1308

## 2012-06-12 NOTE — ED Notes (Signed)
Patient with no complaints at this time. Respirations even and unlabored. Skin warm/dry. Discharge instructions reviewed with patient at this time. Patient given opportunity to voice concerns/ask questions. Patient discharged at this time and left Emergency Department with steady gait.   

## 2012-06-12 NOTE — ED Provider Notes (Signed)
Medical screening examination/treatment/procedure(s) were performed by non-physician practitioner and as supervising physician I was immediately available for consultation/collaboration.   Carleene Cooper III, MD 06/12/12 [phone]

## 2012-06-17 ENCOUNTER — Emergency Department (HOSPITAL_COMMUNITY)
Admission: EM | Admit: 2012-06-17 | Discharge: 2012-06-17 | Disposition: A | Discharge status: Home / Self Care | Payer: BC Managed Care – PPO | Attending: Emergency Medicine | Admitting: Emergency Medicine

## 2012-06-17 ENCOUNTER — Encounter (HOSPITAL_COMMUNITY): Payer: Self-pay | Admitting: *Deleted

## 2012-06-17 DIAGNOSIS — J3489 Other specified disorders of nose and nasal sinuses: Secondary | ICD-10-CM | POA: Insufficient documentation

## 2012-06-17 DIAGNOSIS — B9789 Other viral agents as the cause of diseases classified elsewhere: Secondary | ICD-10-CM | POA: Insufficient documentation

## 2012-06-17 DIAGNOSIS — B349 Viral infection, unspecified: Secondary | ICD-10-CM

## 2012-06-17 DIAGNOSIS — IMO0001 Reserved for inherently not codable concepts without codable children: Secondary | ICD-10-CM | POA: Insufficient documentation

## 2012-06-17 DIAGNOSIS — F411 Generalized anxiety disorder: Secondary | ICD-10-CM | POA: Insufficient documentation

## 2012-06-17 DIAGNOSIS — Z87448 Personal history of other diseases of urinary system: Secondary | ICD-10-CM | POA: Insufficient documentation

## 2012-06-17 DIAGNOSIS — Z79899 Other long term (current) drug therapy: Secondary | ICD-10-CM | POA: Insufficient documentation

## 2012-06-17 DIAGNOSIS — F1310 Sedative, hypnotic or anxiolytic abuse, uncomplicated: Secondary | ICD-10-CM | POA: Insufficient documentation

## 2012-06-17 DIAGNOSIS — Z8742 Personal history of other diseases of the female genital tract: Secondary | ICD-10-CM | POA: Insufficient documentation

## 2012-06-17 DIAGNOSIS — R059 Cough, unspecified: Secondary | ICD-10-CM | POA: Insufficient documentation

## 2012-06-17 DIAGNOSIS — R05 Cough: Secondary | ICD-10-CM | POA: Insufficient documentation

## 2012-06-17 DIAGNOSIS — R6883 Chills (without fever): Secondary | ICD-10-CM | POA: Insufficient documentation

## 2012-06-17 DIAGNOSIS — F17200 Nicotine dependence, unspecified, uncomplicated: Secondary | ICD-10-CM | POA: Insufficient documentation

## 2012-06-17 DIAGNOSIS — K029 Dental caries, unspecified: Secondary | ICD-10-CM | POA: Insufficient documentation

## 2012-06-17 DIAGNOSIS — Z3202 Encounter for pregnancy test, result negative: Secondary | ICD-10-CM | POA: Insufficient documentation

## 2012-06-17 DIAGNOSIS — R110 Nausea: Secondary | ICD-10-CM | POA: Insufficient documentation

## 2012-06-17 LAB — URINALYSIS, ROUTINE W REFLEX MICROSCOPIC
Hgb urine dipstick: NEGATIVE
Nitrite: NEGATIVE
Specific Gravity, Urine: 1.025 (ref 1.005–1.030)
Urobilinogen, UA: 0.2 mg/dL (ref 0.0–1.0)
pH: 6 (ref 5.0–8.0)

## 2012-06-17 MED ORDER — ONDANSETRON HCL 4 MG PO TABS: 4.0000 mg | ORAL_TABLET | Freq: Four times a day (QID) | ORAL | Status: DC

## 2012-06-17 MED ORDER — OXYCODONE-ACETAMINOPHEN 5-325 MG PO TABS
1.0000 | ORAL_TABLET | Freq: Once | ORAL | Status: AC
  Administered 2012-06-17: 1 via ORAL
  Filled 2012-06-17: qty 1

## 2012-06-17 MED ORDER — OXYCODONE-ACETAMINOPHEN 5-325 MG PO TABS: 1.0000 | ORAL_TABLET | ORAL | Status: DC | PRN

## 2012-06-17 MED ORDER — ONDANSETRON 8 MG PO TBDP
8.0000 mg | ORAL_TABLET | Freq: Once | ORAL | Status: AC
Start: 2012-06-17 — End: 2012-06-17
  Administered 2012-06-17: 8 mg via ORAL
  Filled 2012-06-17: qty 1

## 2012-06-17 NOTE — ED Provider Notes (Signed)
History     CSN: [id number]  Arrival date & time 06/17/12  1446   First MD Initiated Contact with Patient 06/17/12 1523      Chief Complaint  Patient presents with  . Generalized Body Aches    (Consider location/radiation/quality/duration/timing/severity/associated sxs/prior treatment) HPI Comments: [name] is a 32 yo female who comes to ED with a complain of generalized body aches and nausea that began yesterday.  States she is also having chills, but unsure if she had a fever at home.  She is currently taking penicillin for a toothache and took her last two vicodin earlier this morning.  She states that her sister also had similar sx's recently.  She reports having intermittent cough and slight nasal congestion.  She denies vomiting, chest pain, shortness of breath, abdominal pain or dysuria.   The history is provided by the patient.    Past Medical History  Diagnosis Date  . Hypokalemia   . Narcotic abuse     5 yrs ago lortab, off few yrs  . Anxiety   . Chronic pain   . Endometriosis   . 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 Tonsillectomy    . Cholecystectomy  2010  . Tubal ligation    . Carpal tunnel release      both  . Abdominal hysterectomy      left ovary remains    Family History  Problem Relation Age of Onset  . Stroke Father   . Diverticulitis Father   . Heart disease    . Arthritis    . Diabetes    . Kidney disease      History  Substance Use Topics  . Smoking status: Current Every Day Smoker -- 1.50 packs/day for 18 years    Types: Cigarettes  . Smokeless tobacco: Not on file  . Alcohol Use: Yes     Comment: rarely    OB History   Grav Para Term Preterm Abortions TAB SAB Ect Mult Living   4 4 4       4       Review of Systems  Constitutional: Positive for chills. Negative for fever, activity change and appetite change.  HENT: Positive for congestion and rhinorrhea. Negative for ear  pain, sore throat, facial swelling, trouble swallowing, neck pain and neck stiffness.   Respiratory: Negative for cough, chest tightness, shortness of breath and wheezing.   Cardiovascular: Negative for chest pain.  Gastrointestinal: Positive for nausea. Negative for vomiting, abdominal pain, diarrhea and abdominal distention.  Genitourinary: Negative for dysuria, urgency, frequency, decreased urine volume, vaginal bleeding, vaginal discharge, difficulty urinating and vaginal pain.  Musculoskeletal: Positive for myalgias.  Skin: Negative for rash.  Neurological: Negative for dizziness, syncope, facial asymmetry, weakness, light-headedness, numbness and headaches.  Hematological: Negative for adenopathy.  All other systems reviewed and are negative.    Allergies  Fioricet-codeine; Ibuprofen; Ketorolac tromethamine; and Divalproex sodium  Home Medications   Current Outpatient Rx  Name  Route  Sig  Dispense  Refill  . ALPRAZolam (XANAX) 1 MG tablet   Oral   Take 1 mg by mouth 3 (three) times daily as needed for anxiety. Anxiety         . benzocaine (ORAJEL) 10 % mucosal gel   Mouth/Throat   Use as directed 1 application in the mouth or throat as needed for pain.         . diphenhydrAMINE (BENADRYL) 25  mg capsule   Oral   Take 25-50 mg by mouth every 6 (six) hours as needed for allergies.         Marland Kitchen HYDROcodone-acetaminophen (NORCO/VICODIN) 5-325 MG per tablet   Oral   Take 1 tablet by mouth every 4 (four) hours as needed.   20 tablet   0   . penicillin v potassium (VEETID) 500 MG tablet   Oral   Take 1 tablet (500 mg total) by mouth 3 (three) times daily.   30 tablet   0   . traMADol-acetaminophen (ULTRACET) 37.5-325 MG per tablet                 BP 102/61  Pulse 90  Temp(Src) 97.9 F (36.6 C) (Oral)  Resp 20  Ht 5\' 1"  (1.549 m)  Wt 123 lb (55.792 kg)  BMI 23.25 kg/m2  SpO2 100%  Physical Exam  Nursing note and vitals reviewed. Constitutional: She is  oriented to person, place, and time. She appears well-developed and well-nourished. No distress.  HENT:  Head: Normocephalic and atraumatic. No trismus in the jaw.  Right Ear: Tympanic membrane and ear canal normal.  Left Ear: Tympanic membrane and ear canal normal.  Mouth/Throat: Uvula is midline, oropharynx is clear and moist and mucous membranes are normal. No oral lesions. Dental caries present. No dental abscesses or edematous.    Eyes: Conjunctivae and EOM are normal. Pupils are equal, round, and reactive to light.  Neck: Normal range of motion, full passive range of motion without pain and phonation normal. Neck supple. No Brudzinski's sign and no Kernig's sign noted. No thyromegaly present.  Cardiovascular: Normal rate, regular rhythm, normal heart sounds and intact distal pulses.   No murmur heard. Pulmonary/Chest: Effort normal and breath sounds normal. No respiratory distress. She has no wheezes. She has no rales. She exhibits no tenderness.  Abdominal: Soft. She exhibits no distension. There is no tenderness. There is no rebound and no guarding.  Musculoskeletal: Normal range of motion.  Lymphadenopathy:    She has no cervical adenopathy.  Neurological: She is alert and oriented to person, place, and time. She exhibits normal muscle tone. Coordination normal.  Skin: Skin is warm and dry.  Psychiatric: She has a normal mood and affect.    ED Course  Procedures (including critical care time)  Results for orders placed during the hospital encounter of 06/17/12  URINALYSIS, ROUTINE W REFLEX MICROSCOPIC      Result Value Range   Color, Urine YELLOW  YELLOW   APPearance CLEAR  CLEAR   Specific Gravity, Urine 1.025  1.005 - 1.030   pH 6.0  5.0 - 8.0   Glucose, UA NEGATIVE  NEGATIVE mg/dL   Hgb urine dipstick NEGATIVE  NEGATIVE   Bilirubin Urine NEGATIVE  NEGATIVE   Ketones, ur NEGATIVE  NEGATIVE mg/dL   Protein, ur NEGATIVE  NEGATIVE mg/dL   Urobilinogen, UA 0.2  0.0 - 1.0  mg/dL   Nitrite NEGATIVE  NEGATIVE   Leukocytes, UA NEGATIVE  NEGATIVE  POCT PREGNANCY, URINE      Result Value Range   Preg Test, Ur NEGATIVE  NEGATIVE        MDM    Patient is feeling better after percocet and zofran.  Has ambulated to the nursing desk without difficulty. Currently taking antibiotics.   Sx's likely related to viral illness.  abd remains soft, NT, vitals stable.  Non-toxic appearing.    The patient appears reasonably screened and/or stabilized for discharge and  I doubt any other medical condition or other [facility] requiring further screening, evaluation, or treatment in the ED at this time prior to discharge.       Larren Copes L. Trisha Mangle, PA-C 06/21/12 [phone]

## 2012-06-17 NOTE — ED Notes (Signed)
Body aches with nausea onset yesterday

## 2012-06-18 LAB — POCT PREGNANCY, URINE: Preg Test, Ur: NEGATIVE

## 2012-06-21 NOTE — ED Provider Notes (Signed)
Medical screening examination/treatment/procedure(s) were performed by non-physician practitioner and as supervising physician I was immediately available for consultation/collaboration.  Betania Dizon, MD 06/21/12 1521 

## 2012-07-12 ENCOUNTER — Encounter (HOSPITAL_COMMUNITY): Payer: Self-pay | Admitting: *Deleted

## 2012-07-12 ENCOUNTER — Emergency Department (HOSPITAL_COMMUNITY)
Admission: EM | Admit: 2012-07-12 | Discharge: 2012-07-12 | Disposition: A | Discharge status: Home / Self Care | Payer: BC Managed Care – PPO | Attending: Emergency Medicine | Admitting: Emergency Medicine

## 2012-07-12 DIAGNOSIS — Z87448 Personal history of other diseases of urinary system: Secondary | ICD-10-CM | POA: Insufficient documentation

## 2012-07-12 DIAGNOSIS — Z862 Personal history of diseases of the blood and blood-forming organs and certain disorders involving the immune mechanism: Secondary | ICD-10-CM | POA: Insufficient documentation

## 2012-07-12 DIAGNOSIS — F17200 Nicotine dependence, unspecified, uncomplicated: Secondary | ICD-10-CM | POA: Insufficient documentation

## 2012-07-12 DIAGNOSIS — K029 Dental caries, unspecified: Secondary | ICD-10-CM | POA: Insufficient documentation

## 2012-07-12 DIAGNOSIS — G8929 Other chronic pain: Secondary | ICD-10-CM | POA: Insufficient documentation

## 2012-07-12 DIAGNOSIS — Z8639 Personal history of other endocrine, nutritional and metabolic disease: Secondary | ICD-10-CM | POA: Insufficient documentation

## 2012-07-12 DIAGNOSIS — F411 Generalized anxiety disorder: Secondary | ICD-10-CM | POA: Insufficient documentation

## 2012-07-12 DIAGNOSIS — Z8742 Personal history of other diseases of the female genital tract: Secondary | ICD-10-CM | POA: Insufficient documentation

## 2012-07-12 DIAGNOSIS — Z79899 Other long term (current) drug therapy: Secondary | ICD-10-CM | POA: Insufficient documentation

## 2012-07-12 DIAGNOSIS — K089 Disorder of teeth and supporting structures, unspecified: Secondary | ICD-10-CM | POA: Insufficient documentation

## 2012-07-12 MED ORDER — AMOXICILLIN 500 MG PO CAPS: 1000.0000 mg | ORAL_CAPSULE | Freq: Two times a day (BID) | ORAL | Status: DC

## 2012-07-12 MED ORDER — ONDANSETRON 8 MG PO TBDP
8.0000 mg | ORAL_TABLET | Freq: Once | ORAL | Status: AC
  Administered 2012-07-12: 8 mg via ORAL
  Filled 2012-07-12: qty 1

## 2012-07-12 MED ORDER — AMOXICILLIN 250 MG PO CAPS
1000.0000 mg | ORAL_CAPSULE | Freq: Once | ORAL | Status: AC
  Administered 2012-07-12: 1000 mg via ORAL
  Filled 2012-07-12: qty 4

## 2012-07-12 MED ORDER — TRAMADOL HCL 50 MG PO TABS: 50.0000 mg | ORAL_TABLET | Freq: Four times a day (QID) | ORAL | Status: DC | PRN

## 2012-07-12 MED ORDER — HYDROMORPHONE HCL PF 2 MG/ML IJ SOLN
2.0000 mg | Freq: Once | INTRAMUSCULAR | Status: AC
  Administered 2012-07-12: 2 mg via INTRAMUSCULAR
  Filled 2012-07-12: qty 1

## 2012-07-12 NOTE — ED Provider Notes (Signed)
History    This chart was scribed for Dione Booze, MD by Leone Payor, ED Scribe. This patient was seen in room APA17/APA17 and the patient's care was started 8:23 AM.   CSN: [id number]  Arrival date & time 07/12/12  4098   First MD Initiated Contact with Patient 07/12/12 [phone]      Chief Complaint  Patient presents with  . Dental Pain     The history is provided by the patient. No language interpreter was used.    [name] is a 32 y.o. female who presents to the Emergency Department complaining of ongoing, constant, unchanged dental pain to the upper teeth starting 5-6 days ago. She rates the pain as 7/10 currently. She is taking benadryl to help relieve pressure (last dose at 0600) and tylenol for pain (last dose 0200). Pt is a current everyday smoker and occasional alcohol user. Pt admits to having narcotic problem in the past and requests ultram instead.   Past Medical History  Diagnosis Date  . Hypokalemia   . Narcotic abuse     5 yrs ago lortab, off few yrs  . Anxiety   . Chronic pain   . Endometriosis   . 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 Tonsillectomy    . Cholecystectomy  2010  . Tubal ligation    . Carpal tunnel release      both  . Abdominal hysterectomy      left ovary remains    Family History  Problem Relation Age of Onset  . Stroke Father   . Diverticulitis Father   . Heart disease    . Arthritis    . Diabetes    . Kidney disease      History  Substance Use Topics  . Smoking status: Current Every Day Smoker -- 1.50 packs/day for 18 years    Types: Cigarettes  . Smokeless tobacco: Not on file  . Alcohol Use: Yes     Comment: rarely    OB History   Grav Para Term Preterm Abortions TAB SAB Ect Mult Living   4 4 4       4       Review of Systems  Constitutional: Negative for fever.  HENT: Positive for dental problem.     Allergies  Fioricet-codeine; Ibuprofen; Ketorolac tromethamine;  and Divalproex sodium  Home Medications   Current Outpatient Rx  Name  Route  Sig  Dispense  Refill  . ALPRAZolam (XANAX) 1 MG tablet   Oral   Take 1 mg by mouth 3 (three) times daily as needed for anxiety. Anxiety         . benzocaine (ORAJEL) 10 % mucosal gel   Mouth/Throat   Use as directed 1 application in the mouth or throat as needed for pain.         . diphenhydrAMINE (BENADRYL) 25 mg capsule   Oral   Take 25-50 mg by mouth every 6 (six) hours as needed for allergies.         Marland Kitchen HYDROcodone-acetaminophen (NORCO/VICODIN) 5-325 MG per tablet   Oral   Take 1 tablet by mouth every 4 (four) hours as needed.   20 tablet   0   . ondansetron (ZOFRAN) 4 MG tablet   Oral   Take 1 tablet (4 mg total) by mouth every 6 (six) hours.   12 tablet   0   . oxyCODONE-acetaminophen (PERCOCET/ROXICET) 5-325 MG per  tablet   Oral   Take 1 tablet by mouth every 4 (four) hours as needed for pain.   8 tablet   0   . traMADol-acetaminophen (ULTRACET) 37.5-325 MG per tablet                 BP 132/66  Pulse 115  Temp(Src) 98.4 F (36.9 C) (Oral)  Resp 16  Ht 5\' 1"  (1.549 m)  Wt 120 lb (54.432 kg)  BMI 22.69 kg/m2  SpO2 100%  Physical Exam  Nursing note and vitals reviewed. Constitutional: She is oriented to person, place, and time. She appears well-developed and well-nourished. No distress.  Appears uncomfortable  HENT:  Head: Normocephalic and atraumatic.  Mouth/Throat: Dental caries present.  Multiple dental caries.  Marked tenderness to percussion on teeth number 1, 2 , and 3  Eyes: EOM are normal.  Neck: Neck supple. No tracheal deviation present.  Cardiovascular: Normal rate.   Pulmonary/Chest: Effort normal. No respiratory distress.  Musculoskeletal: Normal range of motion.  Neurological: She is alert and oriented to person, place, and time.  Skin: Skin is warm and dry.  Psychiatric: She has a normal mood and affect. Her behavior is normal.    ED Course   Procedures (including critical care time)  DIAGNOSTIC STUDIES: Oxygen Saturation is 100% on room air, normal by my interpretation.    COORDINATION OF CARE: 10:14 AM Discussed treatment plan with pt at bedside and pt agreed to plan.     1. Pain due to dental caries       MDM  Dental pain with very poor dentition and diffuse caries. She is discharged with prescription for amoxicillin and tramadol. Old records are reviewed and she has several ED visits for dental pain.    I personally performed the services described in this documentation, which was scribed in my presence. The recorded information has been reviewed and is accurate.     Dione Booze, MD 07/12/12 [phone]

## 2012-07-12 NOTE — ED Notes (Signed)
Pt states that this pain in her tooth has been present for 5 days. Pt reports taking Benadryl to help relieve pressure, and taken Tylenol for pain. Pt states she took Tylenol at 0200 this AM with no relief. Last dose of Benadryl was at 0600. Pt states she is looking for a new dentist.

## 2012-07-12 NOTE — ED Notes (Signed)
Dental pain to top right x 5 days.

## 2012-07-26 ENCOUNTER — Encounter (HOSPITAL_COMMUNITY): Payer: Self-pay | Admitting: *Deleted

## 2012-07-26 ENCOUNTER — Emergency Department (HOSPITAL_COMMUNITY)
Admission: EM | Admit: 2012-07-26 | Discharge: 2012-07-26 | Disposition: A | Discharge status: Home / Self Care | Payer: BC Managed Care – PPO | Attending: Emergency Medicine | Admitting: Emergency Medicine

## 2012-07-26 DIAGNOSIS — Z8659 Personal history of other mental and behavioral disorders: Secondary | ICD-10-CM | POA: Insufficient documentation

## 2012-07-26 DIAGNOSIS — F17200 Nicotine dependence, unspecified, uncomplicated: Secondary | ICD-10-CM | POA: Insufficient documentation

## 2012-07-26 DIAGNOSIS — S39012A Strain of muscle, fascia and tendon of lower back, initial encounter: Secondary | ICD-10-CM

## 2012-07-26 DIAGNOSIS — Z87448 Personal history of other diseases of urinary system: Secondary | ICD-10-CM | POA: Insufficient documentation

## 2012-07-26 DIAGNOSIS — Z862 Personal history of diseases of the blood and blood-forming organs and certain disorders involving the immune mechanism: Secondary | ICD-10-CM | POA: Insufficient documentation

## 2012-07-26 DIAGNOSIS — Y9289 Other specified places as the place of occurrence of the external cause: Secondary | ICD-10-CM | POA: Insufficient documentation

## 2012-07-26 DIAGNOSIS — Y9389 Activity, other specified: Secondary | ICD-10-CM | POA: Insufficient documentation

## 2012-07-26 DIAGNOSIS — S335XXA Sprain of ligaments of lumbar spine, initial encounter: Secondary | ICD-10-CM | POA: Insufficient documentation

## 2012-07-26 DIAGNOSIS — Z8742 Personal history of other diseases of the female genital tract: Secondary | ICD-10-CM | POA: Insufficient documentation

## 2012-07-26 DIAGNOSIS — Z8639 Personal history of other endocrine, nutritional and metabolic disease: Secondary | ICD-10-CM | POA: Insufficient documentation

## 2012-07-26 DIAGNOSIS — X500XXA Overexertion from strenuous movement or load, initial encounter: Secondary | ICD-10-CM | POA: Insufficient documentation

## 2012-07-26 LAB — URINALYSIS, ROUTINE W REFLEX MICROSCOPIC
Ketones, ur: NEGATIVE mg/dL
Leukocytes, UA: NEGATIVE
Nitrite: NEGATIVE
Protein, ur: NEGATIVE mg/dL
pH: 7 (ref 5.0–8.0)

## 2012-07-26 MED ORDER — TRAMADOL HCL 50 MG PO TABS: 50.0000 mg | ORAL_TABLET | Freq: Four times a day (QID) | ORAL | Status: DC | PRN

## 2012-07-26 MED ORDER — HYDROMORPHONE HCL PF 1 MG/ML IJ SOLN
1.0000 mg | Freq: Once | INTRAMUSCULAR | Status: AC
  Administered 2012-07-26: 1 mg via INTRAMUSCULAR
  Filled 2012-07-26: qty 1

## 2012-07-26 NOTE — ED Notes (Signed)
Patient waiting med time.  

## 2012-07-26 NOTE — ED Notes (Signed)
History of chronic back pain, but turned wrong on Saturday which escalated pain.  Has tried Tylenol and warm compresses.

## 2012-07-30 NOTE — ED Provider Notes (Signed)
History     CSN: [id number]  Arrival date & time 07/26/12  0803   First MD Initiated Contact with Patient 07/26/12 [phone]      Chief Complaint  Patient presents with  . Back Pain    (Consider location/radiation/quality/duration/timing/severity/associated sxs/prior treatment) HPI Comments: [name] is a 32 y.o. Female presenting with acute on chronic low back pain which has which has been present for the past 2 days.   Patient denies any new injury specifically, but states she twisted her back while bending and has had increased pain since the event.  There is no radiation into her lower extremities.  There has been no weakness or numbness in the lower extremities and no urinary or bowel retention or incontinence.  Patient does not have a history of cancer or IVDU.  She has tried tylenol and warm compresses without relief of symptoms.  She denies dysuria, hematuria and increased frequency of urination.      The history is provided by the patient.    Past Medical History  Diagnosis Date  . Hypokalemia   . Narcotic abuse     5 yrs ago lortab, off few yrs  . Anxiety   . Chronic pain   . Endometriosis   . 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 Tonsillectomy    . Cholecystectomy  2010  . Tubal ligation    . Carpal tunnel release      both  . Abdominal hysterectomy      left ovary remains    Family History  Problem Relation Age of Onset  . Stroke Father   . Diverticulitis Father   . Heart disease    . Arthritis    . Diabetes    . Kidney disease      History  Substance Use Topics  . Smoking status: Current Every Day Smoker -- 1.50 packs/day for 18 years    Types: Cigarettes  . Smokeless tobacco: Not on file  . Alcohol Use: Yes     Comment: rarely    OB History   Grav Para Term Preterm Abortions TAB SAB Ect Mult Living   4 4 4       4       Review of Systems  Constitutional: Negative for fever.   Respiratory: Negative for shortness of breath.   Cardiovascular: Negative for chest pain and leg swelling.  Gastrointestinal: Negative for abdominal pain, constipation and abdominal distention.  Genitourinary: Negative for dysuria, urgency, frequency, flank pain and difficulty urinating.  Musculoskeletal: Positive for back pain. Negative for joint swelling and gait problem.  Skin: Negative for rash.  Neurological: Negative for weakness and numbness.    Allergies  Fioricet-codeine; Ibuprofen; Ketorolac tromethamine; and Divalproex sodium  Home Medications   Current Outpatient Rx  Name  Route  Sig  Dispense  Refill  . acetaminophen (TYLENOL) 500 MG tablet   Oral   Take 1,000 mg by mouth every 6 (six) hours as needed for pain.         . traMADol (ULTRAM) 50 MG tablet   Oral   Take 1 tablet (50 mg total) by mouth every 6 (six) hours as needed for pain.   15 tablet   0     BP 118/71  Pulse 111  Temp(Src) 97.8 F (36.6 C) (Oral)  Ht 5\' 1"  (1.549 m)  Wt 120 lb (54.432 kg)  BMI 22.69 kg/m2  SpO2 100%  Physical  Exam  Nursing note and vitals reviewed. Constitutional: She appears well-developed and well-nourished.  Appears uncomfortable.  She is splinting her right lower back.  HENT:  Head: Normocephalic.  Eyes: Conjunctivae are normal.  Neck: Normal range of motion. Neck supple.  Cardiovascular: Normal rate and intact distal pulses.   Pedal pulses normal.  Pulmonary/Chest: Effort normal.  Abdominal: Soft. Bowel sounds are normal. She exhibits no distension and no mass.  Musculoskeletal: Normal range of motion. She exhibits tenderness. She exhibits no edema.       Lumbar back: She exhibits tenderness. She exhibits no bony tenderness, no swelling, no edema and no spasm.  Right lower flank ttp.  Neurological: She is alert. She has normal strength. She displays no atrophy and no tremor. No sensory deficit. Gait normal.  Reflex Scores:      Patellar reflexes are 2+ on the  right side and 2+ on the left side.      Achilles reflexes are 2+ on the right side and 2+ on the left side. No strength deficit noted in hip and knee flexor and extensor muscle groups.  Ankle flexion and extension intact.  Skin: Skin is warm and dry.  Psychiatric: She has a normal mood and affect.    ED Course  Procedures (including critical care time)  Labs Reviewed  URINALYSIS, ROUTINE W REFLEX MICROSCOPIC - Abnormal; Notable for the following:    Specific Gravity, Urine <1.005 (*)    All other components within normal limits   No results found.   1. Lumbar strain, initial encounter       MDM  Patients labs and/or radiological studies were viewed and considered during the medical decision making and disposition process. No neuro deficit on exam or by history to suggest emergent or surgical presentation.  Also discussed worsened sx that should prompt immediate re-evaluation including distal weakness, bowel/bladder retention/incontinence.  Pt was prescribed ultram,  Encouraged continued rest,  Heat tx,  F/u with her pcp if not improving over the next 4-5 days, sooner for any worsened sx.            Burgess Amor, PA-C 07/30/12 1028

## 2012-08-05 NOTE — ED Provider Notes (Signed)
Medical screening examination/treatment/procedure(s) were performed by non-physician practitioner and as supervising physician I was immediately available for consultation/collaboration.   Shota Kohrs W. Callaway Hailes, MD 08/05/12 0037 

## 2012-08-19 ENCOUNTER — Emergency Department (HOSPITAL_COMMUNITY)
Admission: EM | Admit: 2012-08-19 | Discharge: 2012-08-19 | Disposition: A | Discharge status: Home / Self Care | Payer: BC Managed Care – PPO | Attending: Emergency Medicine | Admitting: Emergency Medicine

## 2012-08-19 ENCOUNTER — Emergency Department (HOSPITAL_COMMUNITY): Payer: BC Managed Care – PPO

## 2012-08-19 ENCOUNTER — Encounter (HOSPITAL_COMMUNITY): Payer: Self-pay | Admitting: *Deleted

## 2012-08-19 DIAGNOSIS — R102 Pelvic and perineal pain: Secondary | ICD-10-CM

## 2012-08-19 DIAGNOSIS — Z862 Personal history of diseases of the blood and blood-forming organs and certain disorders involving the immune mechanism: Secondary | ICD-10-CM | POA: Insufficient documentation

## 2012-08-19 DIAGNOSIS — Z8742 Personal history of other diseases of the female genital tract: Secondary | ICD-10-CM | POA: Insufficient documentation

## 2012-08-19 DIAGNOSIS — M545 Low back pain, unspecified: Secondary | ICD-10-CM | POA: Insufficient documentation

## 2012-08-19 DIAGNOSIS — Z8659 Personal history of other mental and behavioral disorders: Secondary | ICD-10-CM | POA: Insufficient documentation

## 2012-08-19 DIAGNOSIS — Z87448 Personal history of other diseases of urinary system: Secondary | ICD-10-CM | POA: Insufficient documentation

## 2012-08-19 DIAGNOSIS — Z8639 Personal history of other endocrine, nutritional and metabolic disease: Secondary | ICD-10-CM | POA: Insufficient documentation

## 2012-08-19 DIAGNOSIS — F17200 Nicotine dependence, unspecified, uncomplicated: Secondary | ICD-10-CM | POA: Insufficient documentation

## 2012-08-19 DIAGNOSIS — N949 Unspecified condition associated with female genital organs and menstrual cycle: Secondary | ICD-10-CM | POA: Insufficient documentation

## 2012-08-19 DIAGNOSIS — Z3202 Encounter for pregnancy test, result negative: Secondary | ICD-10-CM | POA: Insufficient documentation

## 2012-08-19 LAB — COMPREHENSIVE METABOLIC PANEL
AST: 14 U/L (ref 0–37)
BUN: 4 mg/dL — ABNORMAL LOW (ref 6–23)
CO2: 29 mEq/L (ref 19–32)
Chloride: 98 mEq/L (ref 96–112)
Creatinine, Ser: 0.63 mg/dL (ref 0.50–1.10)
GFR calc non Af Amer: >90 mL/min (ref >90)
Glucose, Bld: 88 mg/dL (ref 70–99)
Total Bilirubin: 0.3 mg/dL (ref 0.3–1.2)

## 2012-08-19 LAB — CBC WITH DIFFERENTIAL/PLATELET
HCT: 42 % (ref 36.0–46.0)
Hemoglobin: 14.4 g/dL (ref 12.0–15.0)
Lymphocytes Relative: 24 % (ref 12–46)
Lymphs Abs: 2.7 10*3/uL (ref 0.7–4.0)
Monocytes Absolute: 0.6 10*3/uL (ref 0.1–1.0)
Monocytes Relative: 5 % (ref 3–12)
Neutro Abs: 8.1 10*3/uL — ABNORMAL HIGH (ref 1.7–7.7)
WBC: 11.5 10*3/uL — ABNORMAL HIGH (ref 4.0–10.5)

## 2012-08-19 LAB — URINALYSIS, ROUTINE W REFLEX MICROSCOPIC
Ketones, ur: NEGATIVE mg/dL
Leukocytes, UA: NEGATIVE
Nitrite: NEGATIVE
Protein, ur: NEGATIVE mg/dL
pH: 5.5 (ref 5.0–8.0)

## 2012-08-19 LAB — WET PREP, GENITAL: WBC, Wet Prep HPF POC: NONE SEEN

## 2012-08-19 LAB — PREGNANCY, URINE: Preg Test, Ur: NEGATIVE

## 2012-08-19 MED ORDER — TRAMADOL HCL 50 MG PO TABS: 50.0000 mg | ORAL_TABLET | Freq: Four times a day (QID) | ORAL | Status: DC | PRN

## 2012-08-19 MED ORDER — ONDANSETRON HCL 4 MG PO TABS: 4.0000 mg | ORAL_TABLET | Freq: Four times a day (QID) | ORAL | Status: DC

## 2012-08-19 MED ORDER — ACETAMINOPHEN 325 MG PO TABS
650.0000 mg | ORAL_TABLET | Freq: Once | ORAL | Status: AC
  Administered 2012-08-19: 650 mg via ORAL
  Filled 2012-08-19: qty 2

## 2012-08-19 MED ORDER — OXYCODONE-ACETAMINOPHEN 5-325 MG PO TABS
2.0000 | ORAL_TABLET | Freq: Once | ORAL | Status: AC
  Administered 2012-08-19: 2 via ORAL
  Filled 2012-08-19: qty 2

## 2012-08-19 MED ORDER — IOHEXOL 300 MG/ML  SOLN
50.0000 mL | Freq: Once | INTRAMUSCULAR | Status: AC | PRN
  Administered 2012-08-19: 50 mL via ORAL

## 2012-08-19 MED ORDER — ONDANSETRON HCL 4 MG/2ML IJ SOLN
4.0000 mg | Freq: Once | INTRAMUSCULAR | Status: AC
  Administered 2012-08-19: 4 mg via INTRAVENOUS
  Filled 2012-08-19: qty 2

## 2012-08-19 MED ORDER — SODIUM CHLORIDE 0.9 % IV BOLUS (SEPSIS)
1000.0000 mL | Freq: Once | INTRAVENOUS | Status: AC
  Administered 2012-08-19: 1000 mL via INTRAVENOUS

## 2012-08-19 MED ORDER — IOHEXOL 300 MG/ML  SOLN
100.0000 mL | Freq: Once | INTRAMUSCULAR | Status: AC | PRN
  Administered 2012-08-19: 100 mL via INTRAVENOUS

## 2012-08-19 MED ORDER — MORPHINE SULFATE 4 MG/ML IJ SOLN
4.0000 mg | Freq: Once | INTRAMUSCULAR | Status: AC
  Administered 2012-08-19: 4 mg via INTRAVENOUS
  Filled 2012-08-19: qty 1

## 2012-08-19 NOTE — ED Provider Notes (Signed)
History    This chart was scribed for Angel Octave, MD by Leone Payor, ED Scribe. This patient was seen in room APA03/APA03 and the patient's care was started 10:48 AM.   CSN: [id number]  Arrival date & time 08/19/12  1014   First MD Initiated Contact with Patient 08/19/12 1041      Chief Complaint  Patient presents with  . Abdominal Pain  . Back Pain     HPI  HPI Comments: [name] is a 32 y.o. female who presents to the Emergency Department complaining of ongoing, new flare up of constant, LLQ pain that radiates to left lower back that started today. Pt reports having h/o ovarian cysts and has had her R ovary removed. She had some associated vomiting yesterday. Has taken tylenol and tramadol at home with minimal relief. Reports having similar symptoms in the past. She has had a cholecystectomy and partial hysterectomy.  She denies fever, dysuria, hematuria.   Past Medical History  Diagnosis Date  . Hypokalemia   . Narcotic abuse     5 yrs ago lortab, off few yrs  . Anxiety   . Chronic pain   . Endometriosis   . 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 Tonsillectomy    . Cholecystectomy  2010  . Tubal ligation    . Carpal tunnel release      both  . Abdominal hysterectomy      left ovary remains    Family History  Problem Relation Age of Onset  . Stroke Father   . Diverticulitis Father   . Heart disease    . Arthritis    . Diabetes    . Kidney disease      History  Substance Use Topics  . Smoking status: Current Every Day Smoker -- 1.50 packs/day for 18 years    Types: Cigarettes  . Smokeless tobacco: Not on file  . Alcohol Use: Yes     Comment: rarely    OB History   Grav Para Term Preterm Abortions TAB SAB Ect Mult Living   4 4 4       4       Review of Systems A complete 10 system review of systems was obtained and all systems are negative except as noted in the HPI and PMH.   Allergies   Fioricet-codeine; Ibuprofen; Ketorolac tromethamine; and Divalproex sodium  Home Medications   Current Outpatient Rx  Name  Route  Sig  Dispense  Refill  . acetaminophen (TYLENOL) 500 MG tablet   Oral   Take 1,000 mg by mouth every 6 (six) hours as needed for pain.         Marland Kitchen oxyCODONE-acetaminophen (PERCOCET/ROXICET) 5-325 MG per tablet   Oral   Take 1 tablet by mouth every 4 (four) hours as needed for pain.         Marland Kitchen ondansetron (ZOFRAN) 4 MG tablet   Oral   Take 1 tablet (4 mg total) by mouth every 6 (six) hours.   12 tablet   0   . traMADol (ULTRAM) 50 MG tablet   Oral   Take 1 tablet (50 mg total) by mouth every 6 (six) hours as needed for pain.   15 tablet   0   . traMADol (ULTRAM) 50 MG tablet   Oral   Take 1 tablet (50 mg total) by mouth every 6 (six) hours as needed for pain.  15 tablet   0     BP 116/80  Pulse 115  Temp(Src) 98.3 F (36.8 C) (Oral)  Resp 16  Ht 5\' 1"  (1.549 m)  Wt 120 lb (54.432 kg)  BMI 22.69 kg/m2  SpO2 99%  Physical Exam  Nursing note and vitals reviewed. Constitutional: She is oriented to person, place, and time. She appears well-developed and well-nourished. No distress.  Uncomfortable appearing.  HENT:  Head: Normocephalic and atraumatic.  Eyes: EOM are normal.  Neck: Neck supple. No tracheal deviation present.  Cardiovascular: Normal rate, regular rhythm and normal heart sounds.   Pulmonary/Chest: Effort normal. No respiratory distress.  Abdominal: Soft. There is tenderness. There is no guarding.   Tender to LLQ without guarding.      Genitourinary: No vaginal discharge found.  No CVA tenderness. Cervix absent. L adnexal tenderness  Musculoskeletal: Normal range of motion.  5/5 strength in bilateral lower extremities. Ankle plantar and dorsiflexion intact. Great toe extension intact bilaterally. +2 DP and PT pulses. +2 patellar reflexes bilaterally. Normal gait.   Neurological: She is alert and oriented to person,  place, and time.  Skin: Skin is warm and dry.  Psychiatric: She has a normal mood and affect. Her behavior is normal.    ED Course  Procedures (including critical care time)  DIAGNOSTIC STUDIES: Oxygen Saturation is 99% on room air, normal by my interpretation.    COORDINATION OF CARE: 10:50 AM Discussed treatment plan with pt at bedside and pt agreed to plan.   Labs Reviewed  WET PREP, GENITAL - Abnormal; Notable for the following:    Clue Cells Wet Prep HPF POC MANY (*)    All other components within normal limits  URINALYSIS, ROUTINE W REFLEX MICROSCOPIC - Abnormal; Notable for the following:    Specific Gravity, Urine <1.005 (*)    All other components within normal limits  CBC WITH DIFFERENTIAL - Abnormal; Notable for the following:    WBC 11.5 (*)    Neutro Abs 8.1 (*)    All other components within normal limits  COMPREHENSIVE METABOLIC PANEL - Abnormal; Notable for the following:    Potassium 3.4 (*)    BUN 4 (*)    Alkaline Phosphatase 187 (*)    All other components within normal limits  GC/CHLAMYDIA PROBE AMP  PREGNANCY, URINE  LIPASE, BLOOD   US Transvaginal Non-ob  08/19/2012   *RADIOLOGY REPORT*  Clinical Data: 32 year old female with left pelvic pain.  History of hysterectomy and right oophorectomy.  TRANSABDOMINAL AND TRANSVAGINAL ULTRASOUND OF PELVIS Technique:  Both transabdominal and transvaginal ultrasound examinations of the pelvis were performed. Transabdominal technique was performed for global imaging of the pelvis including uterus, ovaries, adnexal regions, and pelvic cul-de-sac.  It was necessary to proceed with endovaginal exam following the transabdominal exam to visualize the adnexal regions.  Comparison:  01/25/2012 CT and 02/02/2004 ultrasound.  Findings:  Uterus: The uterus is not visualized compatible with hysterectomy.  Right ovary:  The right ovary is not visualized compatible with oophorectomy.  Left ovary: The left ovary is unremarkable  measuring 2.8 x 2.2 x 2.8 cm.  There is no evidence of solid mass.  Normal vascular flow is identified.  Other findings: There is no evidence of free fluid or adnexal mass.  IMPRESSION: No evidence of acute abnormality.  Normal left ovary.  Status post hysterectomy and right oophorectomy.   Original Report Authenticated By: Harmon Pier, M.D.   US Pelvis Complete  08/19/2012   *RADIOLOGY REPORT*  Clinical  Data: 32 year old female with left pelvic pain.  History of hysterectomy and right oophorectomy.  TRANSABDOMINAL AND TRANSVAGINAL ULTRASOUND OF PELVIS Technique:  Both transabdominal and transvaginal ultrasound examinations of the pelvis were performed. Transabdominal technique was performed for global imaging of the pelvis including uterus, ovaries, adnexal regions, and pelvic cul-de-sac.  It was necessary to proceed with endovaginal exam following the transabdominal exam to visualize the adnexal regions.  Comparison:  01/25/2012 CT and 02/02/2004 ultrasound.  Findings:  Uterus: The uterus is not visualized compatible with hysterectomy.  Right ovary:  The right ovary is not visualized compatible with oophorectomy.  Left ovary: The left ovary is unremarkable measuring 2.8 x 2.2 x 2.8 cm.  There is no evidence of solid mass.  Normal vascular flow is identified.  Other findings: There is no evidence of free fluid or adnexal mass.  IMPRESSION: No evidence of acute abnormality.  Normal left ovary.  Status post hysterectomy and right oophorectomy.   Original Report Authenticated By: Harmon Pier, M.D.   Ct Abdomen Pelvis W Contrast  08/19/2012   *RADIOLOGY REPORT*  Clinical Data:  Abdominal pain and back pain  CT ABDOMEN AND PELVIS WITH CONTRAST  Technique:  Multidetector CT imaging of the abdomen and pelvis was performed following the standard protocol during bolus administration of intravenous contrast.  Contrast: 50mL OMNIPAQUE IOHEXOL 300 MG/ML  SOLN, OMNIPAQUE IOHEXOL 300 MG/ML  SOLN  Comparison: 01/25/2012   Findings: The lung bases are clear.  No pericardial or pleural effusion.  Mild diffuse low attenuation within the liver is noted consistent with fatty infiltration no suspicious liver abnormality. Prior cholecystectomy.  No biliary dilatation.  The pancreas is unremarkable.  Normal appearance of the spleen.  The adrenal glands are both normal.  Right kidney is normal.  Small left renal hypodensity measures 6 mm and is too small to characterize, image 25/series 2.  Urinary bladder is normal. Previous hysterectomy.  The left ovary is identified measuring 3.5 x 2.1 x 4.4 cm.  Right ovary is not seen.  Normal caliber of the abdominal aorta.  No aneurysm.  No upper abdominal adenopathy.  There is no pelvic or inguinal adenopathy noted.  No free fluid or abnormal fluid collections identified within the abdomen or pelvis.  The stomach is normal.  The small bowel loops are unremarkable. Normal appearance of the appendix.  The colon is within normal limits.  Review of the visualized bony structures is unremarkable.  IMPRESSION:  1.  Hepatic steatosis. 2.  Prior cholecystectomy and hysterectomy.  The right ovary has presumably been removed. 3.  The appendix is visualized and appears within normal limits.   Original Report Authenticated By: Signa Kell, M.D.     1. Pelvic pain       MDM  LLQ pain that radiates to L back that started today. Nontoxic appearing, abdomen without peritoneal signs.   Urinalysis negative. Pelvic exam with left adnexal tenderness. And Ultrasound performed to rule out torsion.  No evidence of torsion on ultrasound. Normal left ovary. No evidence of hematuria or urinary tract infection.  Patient is a recovering narcotic and does not want any narcotics for home. She requests Ultram. Followup with gynecology. She has frequent ED visits for pain complaints. Tolerating by mouth in ED. And ambulatory without problem.  I personally performed the services described in this  documentation, which was scribed in my presence. The recorded information has been reviewed and is accurate.     Angel Octave, MD 08/19/12 [phone]

## 2012-08-19 NOTE — Progress Notes (Signed)
I spoke with Mardene Celeste the charge RN @ [phone] and told her that I was taking room 3 to CT. I was unable locate patient's RN

## 2012-08-19 NOTE — ED Notes (Signed)
Pt states constant ache with intermittent sharp pains to left lower abdomen with pain radiating to left lower back. Pt states hx of ovarian cyst and cyst to kidney (unsure which kidney). Ongoing pain for over a year and flared up last night.

## 2012-09-12 ENCOUNTER — Emergency Department (HOSPITAL_COMMUNITY)
Admission: EM | Admit: 2012-09-12 | Discharge: 2012-09-12 | Disposition: A | Discharge status: Home / Self Care | Payer: BC Managed Care – PPO | Attending: Emergency Medicine | Admitting: Emergency Medicine

## 2012-09-12 ENCOUNTER — Emergency Department (HOSPITAL_COMMUNITY): Payer: BC Managed Care – PPO

## 2012-09-12 ENCOUNTER — Encounter (HOSPITAL_COMMUNITY): Payer: Self-pay | Admitting: Emergency Medicine

## 2012-09-12 DIAGNOSIS — Z8639 Personal history of other endocrine, nutritional and metabolic disease: Secondary | ICD-10-CM | POA: Insufficient documentation

## 2012-09-12 DIAGNOSIS — F411 Generalized anxiety disorder: Secondary | ICD-10-CM | POA: Insufficient documentation

## 2012-09-12 DIAGNOSIS — R4789 Other speech disturbances: Secondary | ICD-10-CM | POA: Insufficient documentation

## 2012-09-12 DIAGNOSIS — G8929 Other chronic pain: Secondary | ICD-10-CM | POA: Insufficient documentation

## 2012-09-12 DIAGNOSIS — R112 Nausea with vomiting, unspecified: Secondary | ICD-10-CM | POA: Insufficient documentation

## 2012-09-12 DIAGNOSIS — F17200 Nicotine dependence, unspecified, uncomplicated: Secondary | ICD-10-CM | POA: Insufficient documentation

## 2012-09-12 DIAGNOSIS — H53149 Visual discomfort, unspecified: Secondary | ICD-10-CM | POA: Insufficient documentation

## 2012-09-12 DIAGNOSIS — Z79899 Other long term (current) drug therapy: Secondary | ICD-10-CM | POA: Insufficient documentation

## 2012-09-12 DIAGNOSIS — Z862 Personal history of diseases of the blood and blood-forming organs and certain disorders involving the immune mechanism: Secondary | ICD-10-CM | POA: Insufficient documentation

## 2012-09-12 DIAGNOSIS — G43909 Migraine, unspecified, not intractable, without status migrainosus: Secondary | ICD-10-CM | POA: Insufficient documentation

## 2012-09-12 DIAGNOSIS — Z8742 Personal history of other diseases of the female genital tract: Secondary | ICD-10-CM | POA: Insufficient documentation

## 2012-09-12 LAB — CBC WITH DIFFERENTIAL/PLATELET
Basophils Absolute: 0 10*3/uL (ref 0.0–0.1)
Eosinophils Absolute: 0.2 10*3/uL (ref 0.0–0.7)
Eosinophils Relative: 2 % (ref 0–5)
Lymphocytes Relative: 24 % (ref 12–46)
MCV: 100.3 fL — ABNORMAL HIGH (ref 78.0–100.0)
Platelets: 324 10*3/uL (ref 150–400)
RDW: 12.6 % (ref 11.5–15.5)
WBC: 8.8 10*3/uL (ref 4.0–10.5)

## 2012-09-12 LAB — COMPREHENSIVE METABOLIC PANEL
Alkaline Phosphatase: 103 U/L (ref 39–117)
BUN: 6 mg/dL (ref 6–23)
Creatinine, Ser: 0.58 mg/dL (ref 0.50–1.10)
GFR calc Af Amer: >90 mL/min (ref >90)
GFR calc non Af Amer: >90 mL/min (ref >90)
Glucose, Bld: 90 mg/dL (ref 70–99)
Sodium: 141 mEq/L (ref 135–145)
Total Bilirubin: 0.4 mg/dL (ref 0.3–1.2)

## 2012-09-12 MED ORDER — HYDROMORPHONE HCL 4 MG PO TABS: 4.0000 mg | ORAL_TABLET | Freq: Four times a day (QID) | ORAL | Status: DC | PRN

## 2012-09-12 MED ORDER — LORAZEPAM 2 MG/ML IJ SOLN
1.0000 mg | Freq: Once | INTRAMUSCULAR | Status: AC
  Administered 2012-09-12: 1 mg via INTRAVENOUS
  Filled 2012-09-12: qty 1

## 2012-09-12 MED ORDER — ONDANSETRON HCL 4 MG/2ML IJ SOLN
4.0000 mg | Freq: Once | INTRAMUSCULAR | Status: AC
  Administered 2012-09-12: 4 mg via INTRAVENOUS
  Filled 2012-09-12: qty 2

## 2012-09-12 MED ORDER — METOCLOPRAMIDE HCL 5 MG/ML IJ SOLN
10.0000 mg | Freq: Once | INTRAMUSCULAR | Status: AC
  Administered 2012-09-12: 10 mg via INTRAVENOUS
  Filled 2012-09-12: qty 2

## 2012-09-12 MED ORDER — HYDROMORPHONE HCL PF 1 MG/ML IJ SOLN
1.0000 mg | Freq: Once | INTRAMUSCULAR | Status: AC
  Administered 2012-09-12: 1 mg via INTRAVENOUS
  Filled 2012-09-12: qty 1

## 2012-09-12 MED ORDER — DIPHENHYDRAMINE HCL 50 MG/ML IJ SOLN
25.0000 mg | Freq: Once | INTRAMUSCULAR | Status: AC
  Administered 2012-09-12: 25 mg via INTRAVENOUS
  Filled 2012-09-12: qty 1

## 2012-09-12 MED ORDER — PROMETHAZINE HCL 25 MG PO TABS: 25.0000 mg | ORAL_TABLET | Freq: Four times a day (QID) | ORAL | Status: DC | PRN

## 2012-09-12 NOTE — ED Notes (Signed)
Patient with no complaints at this time. Respirations even and unlabored. Skin warm/dry. Discharge instructions reviewed with patient at this time. Patient given opportunity to voice concerns/ask questions. IV removed per policy and band-aid applied to site. Patient discharged at this time and left Emergency Department with steady gait.  

## 2012-09-12 NOTE — ED Provider Notes (Signed)
History  This chart was scribed for [name], MD by Bennett Scrape, ED Scribe. This patient was seen in room APA19/APA19 and the patient's care was started at 3:39 PM.  CSN: [id number]  Arrival date & time 09/12/12  1427   First MD Initiated Contact with Patient 09/12/12 1539     Chief Complaint  Patient presents with  . Migraine    Patient is a 32 y.o. female presenting with migraines. The history is provided by the patient. No language interpreter was used.  Migraine This is a recurrent problem. The current episode started 2 days ago. The problem occurs hourly. The problem has been gradually worsening. Associated symptoms include headaches. Pertinent negatives include no chest pain and no abdominal pain. Exacerbated by: light/sound. Nothing relieves the symptoms. She has tried acetaminophen for the symptoms. The treatment provided no relief.    HPI Comments: [name] is a 32 y.o. female who presents to the Emergency Department complaining of intermittent HA described as a migraine that started rapidly 2 days ago while driving. She lists photophobia as an aggravating modifying factor and reports nausea and emesis as associated symptoms. She has a h/o migraines and reports one similar episode in 2003 that "mimicked a stroke". Pt also reports intermittent slurred speech since the onset but denies any currently. She denies numbness, weakness and trouble swallowing as associated symptoms. She has a h/o narcotic abuse and chronic pain. Pt is a current everyday smoker and occasional alcohol user.  Past Medical History  Diagnosis Date  . Hypokalemia   . Narcotic abuse     5 yrs ago lortab, off few yrs  . Anxiety   . Chronic pain   . Endometriosis   . Renal disorder Cyst on Kidney   Past Surgical History  Procedure Laterality Date  . Tonsillectomy    . Cholecystectomy  2010  . Tubal ligation    . Carpal tunnel release      both  . Abdominal hysterectomy      left ovary  remains   Family History  Problem Relation Age of Onset  . Stroke Father   . Diverticulitis Father   . Heart disease    . Arthritis    . Diabetes    . Kidney disease     History  Substance Use Topics  . Smoking status: Current Every Day Smoker -- 1.50 packs/day for 18 years    Types: Cigarettes  . Smokeless tobacco: Not on file  . Alcohol Use: Yes     Comment: rarely   OB History   Grav Para Term Preterm Abortions TAB SAB Ect Mult Living   4 4 4       4      Review of Systems  Constitutional: Negative for appetite change and fatigue.  HENT: Negative for congestion, sinus pressure and ear discharge.   Eyes: Positive for photophobia. Negative for discharge.  Respiratory: Negative for cough.   Cardiovascular: Negative for chest pain.  Gastrointestinal: Positive for nausea and vomiting. Negative for abdominal pain and diarrhea.  Genitourinary: Negative for frequency and hematuria.  Musculoskeletal: Negative for back pain.  Skin: Negative for rash.  Neurological: Positive for headaches. Negative for seizures.  Psychiatric/Behavioral: Negative for hallucinations.    Allergies  Fioricet-codeine; Ibuprofen; Ketorolac tromethamine; and Divalproex sodium  Home Medications   Current Outpatient Rx  Name  Route  Sig  Dispense  Refill  . acetaminophen (TYLENOL) 500 MG tablet   Oral   Take 1,000 mg  by mouth every 6 (six) hours as needed for pain.         Marland Kitchen ondansetron (ZOFRAN) 4 MG tablet   Oral   Take 1 tablet (4 mg total) by mouth every 6 (six) hours.   12 tablet   0   . oxyCODONE-acetaminophen (PERCOCET/ROXICET) 5-325 MG per tablet   Oral   Take 1 tablet by mouth every 4 (four) hours as needed for pain.         . traMADol (ULTRAM) 50 MG tablet   Oral   Take 1 tablet (50 mg total) by mouth every 6 (six) hours as needed for pain.   15 tablet   0   . traMADol (ULTRAM) 50 MG tablet   Oral   Take 1 tablet (50 mg total) by mouth every 6 (six) hours as needed for  pain.   15 tablet   0    Triage Vitals: BP 110/64  Pulse 87  Temp(Src) 98.2 F (36.8 C) (Oral)  Resp 18  SpO2 100%  Physical Exam  Nursing note and vitals reviewed. Constitutional: She is oriented to person, place, and time. She appears well-developed and well-nourished.  HENT:  Head: Normocephalic and atraumatic.  Eyes: Conjunctivae and EOM are normal. Pupils are equal, round, and reactive to light. No scleral icterus.  Neck: Normal range of motion. Neck supple. No thyromegaly present.  Cardiovascular: Normal rate, regular rhythm and normal heart sounds.  Exam reveals no gallop and no friction rub.   No murmur heard. Pulmonary/Chest: Effort normal and breath sounds normal. No stridor. She has no wheezes. She has no rales. She exhibits no tenderness.  Abdominal: Soft. Bowel sounds are normal. She exhibits no distension. There is tenderness (mild tenderness to lower abdomen pt attributes to an ovarian cyst). There is no rebound.  Musculoskeletal: Normal range of motion. She exhibits no edema.  Lymphadenopathy:    She has no cervical adenopathy.  Neurological: She is alert and oriented to person, place, and time. Coordination normal.  Skin: Skin is warm and dry. No rash noted. No erythema.  Psychiatric: She has a normal mood and affect. Her behavior is normal.    ED Course  Procedures (including critical care time)  Medications  metoCLOPramide (REGLAN) injection 10 mg (not administered)  diphenhydrAMINE (BENADRYL) injection 25 mg (not administered)  HYDROmorphone (DILAUDID) injection 1 mg (not administered)    DIAGNOSTIC STUDIES: Oxygen Saturation is 100% on room air, normal by my interpretation.    COORDINATION OF CARE: 3:48 PM-Discussed treatment plan which includes medications, CT of head, CBC panel and CMP with pt at bedside and pt agreed to plan.   Labs Reviewed - No data to display No results found.  No diagnosis found.  MDM  Migraine headache.  Pt improved  with tx   The chart was scribed for me under my direct supervision.  I personally performed the history, physical, and medical decision making and all procedures in the evaluation of this patient.[name], MD 09/12/12 [phone]

## 2012-09-12 NOTE — ED Notes (Signed)
Migraine intermittent with n/v x 2 days. Vomited x 4 in last 24 hours. Light/sounds increase pain. Dizziness.

## 2012-09-15 ENCOUNTER — Encounter (HOSPITAL_COMMUNITY): Payer: Self-pay

## 2012-09-15 ENCOUNTER — Emergency Department (HOSPITAL_COMMUNITY)
Admission: EM | Admit: 2012-09-15 | Discharge: 2012-09-15 | Disposition: A | Discharge status: Home / Self Care | Payer: BC Managed Care – PPO | Attending: Emergency Medicine | Admitting: Emergency Medicine

## 2012-09-15 DIAGNOSIS — R112 Nausea with vomiting, unspecified: Secondary | ICD-10-CM | POA: Insufficient documentation

## 2012-09-15 DIAGNOSIS — H53149 Visual discomfort, unspecified: Secondary | ICD-10-CM | POA: Insufficient documentation

## 2012-09-15 DIAGNOSIS — F17200 Nicotine dependence, unspecified, uncomplicated: Secondary | ICD-10-CM | POA: Insufficient documentation

## 2012-09-15 DIAGNOSIS — Z87448 Personal history of other diseases of urinary system: Secondary | ICD-10-CM | POA: Insufficient documentation

## 2012-09-15 DIAGNOSIS — Z8742 Personal history of other diseases of the female genital tract: Secondary | ICD-10-CM | POA: Insufficient documentation

## 2012-09-15 DIAGNOSIS — Z8639 Personal history of other endocrine, nutritional and metabolic disease: Secondary | ICD-10-CM | POA: Insufficient documentation

## 2012-09-15 DIAGNOSIS — Z862 Personal history of diseases of the blood and blood-forming organs and certain disorders involving the immune mechanism: Secondary | ICD-10-CM | POA: Insufficient documentation

## 2012-09-15 DIAGNOSIS — G8929 Other chronic pain: Secondary | ICD-10-CM | POA: Insufficient documentation

## 2012-09-15 DIAGNOSIS — G43909 Migraine, unspecified, not intractable, without status migrainosus: Secondary | ICD-10-CM | POA: Insufficient documentation

## 2012-09-15 DIAGNOSIS — Z8659 Personal history of other mental and behavioral disorders: Secondary | ICD-10-CM | POA: Insufficient documentation

## 2012-09-15 MED ORDER — PROCHLORPERAZINE EDISYLATE 5 MG/ML IJ SOLN
10.0000 mg | Freq: Once | INTRAMUSCULAR | Status: AC
Start: 2012-09-15 — End: 2012-09-15
  Administered 2012-09-15: 10 mg via INTRAMUSCULAR
  Filled 2012-09-15: qty 2

## 2012-09-15 MED ORDER — PROCHLORPERAZINE 25 MG RE SUPP: 25.0000 mg | Freq: Two times a day (BID) | RECTAL | Status: DC | PRN

## 2012-09-15 MED ORDER — DEXAMETHASONE SODIUM PHOSPHATE 10 MG/ML IJ SOLN
10.0000 mg | Freq: Once | INTRAMUSCULAR | Status: AC
  Administered 2012-09-15: 10 mg via INTRAMUSCULAR
  Filled 2012-09-15: qty 1

## 2012-09-15 MED ORDER — DIPHENHYDRAMINE HCL 50 MG/ML IJ SOLN
50.0000 mg | Freq: Once | INTRAMUSCULAR | Status: AC
  Administered 2012-09-15: 50 mg via INTRAMUSCULAR
  Filled 2012-09-15: qty 1

## 2012-09-15 NOTE — ED Notes (Signed)
Pt given d/c instructions, stated her pain was a 10, asked if md was going to give her anymore meds for her pain, rn notified pt that md was aware and wants her to f/u w/ her pmd today. Pt stated "i got more to help me on my last visit", advised that administration of meds for pain is at the discretion of the doctor. Offered pt a wheelchair to car, she stated no "i'll just friggin walk out", pt's son at bedside.

## 2012-09-15 NOTE — ED Notes (Signed)
Pt reports was evaluated here Sunday for migraine.  Says headache came back yesterday evening.  Reports nausea and vomiting.  Says was given prescription for  for pain and nausea ,medication to go home with Sunday but says can't keep it down.

## 2012-09-15 NOTE — ED Provider Notes (Signed)
History    This chart was scribed for Gilda Crease, * by Quintella Reichert, ED scribe.  This patient was seen in room APA12/APA12 and the patient's care was started at 7:25 AM.  CSN: [id number]  Arrival date & time 09/15/12  4098     Chief Complaint  Patient presents with  . Headache    The history is provided by the patient. No language interpreter was used.    HPI Comments: [name] is a 32 y.o. female with h/o migraines who presents to the Emergency Department complaining of a constant, gradual-onset, gradually-worsening headache with accompanying photophobia , nausea and emesis.  Symptoms are the same as her previous migraines.   Pain is described as throbbing and localized around the eyes.  She was seen in the ED 3 days ago for the same and was prescribed pain medications and anti-emetics.  She notes that last she was unable to tolerate these medications.  She denies fever, neck pain, or any other associated symptoms.  Past Medical History  Diagnosis Date  . Hypokalemia   . Narcotic abuse     5 yrs ago lortab, off few yrs  . Anxiety   . Chronic pain   . Endometriosis   . Renal disorder Cyst on Kidney   Past Surgical History  Procedure Laterality Date  . Tonsillectomy    . Cholecystectomy  2010  . Tubal ligation    . Carpal tunnel release      both  . Abdominal hysterectomy      left ovary remains   Family History  Problem Relation Age of Onset  . Stroke Father   . Diverticulitis Father   . Heart disease    . Arthritis    . Diabetes    . Kidney disease     History  Substance Use Topics  . Smoking status: Current Every Day Smoker -- 1.50 packs/day for 18 years    Types: Cigarettes  . Smokeless tobacco: Not on file  . Alcohol Use: Yes     Comment: rarely   OB History   Grav Para Term Preterm Abortions TAB SAB Ect Mult Living   4 4 4       4        Review of Systems  Constitutional: Negative for fever.  HENT: Negative for neck pain.    Eyes: Positive for photophobia.  Gastrointestinal: Positive for nausea and vomiting.  Neurological: Positive for headaches.  All other systems reviewed and are negative.      Allergies  Fioricet-codeine; Ibuprofen; Ketorolac tromethamine; and Divalproex sodium  Home Medications   Current Outpatient Rx  Name  Route  Sig  Dispense  Refill  . acetaminophen (TYLENOL) 500 MG tablet   Oral   Take 1,000 mg by mouth every 6 (six) hours as needed for pain.         Marland Kitchen aspirin-acetaminophen-caffeine (EXCEDRIN MIGRAINE) 250-250-65 MG per tablet   Oral   Take 2 tablets by mouth every 4 (four) hours as needed for pain.         Marland Kitchen HYDROmorphone (DILAUDID) 4 MG tablet   Oral   Take 1 tablet (4 mg total) by mouth every 6 (six) hours as needed for pain.   10 tablet   0   . promethazine (PHENERGAN) 25 MG tablet   Oral   Take 1 tablet (25 mg total) by mouth every 6 (six) hours as needed for nausea.   12 tablet   0  BP 119/60  Pulse 97  Temp(Src) 97.7 F (36.5 C) (Oral)  Resp 18  Ht 5\' 1"  (1.549 m)  Wt 120 lb (54.432 kg)  BMI 22.69 kg/m2  SpO2 100%  Physical Exam  Constitutional: She is oriented to person, place, and time. She appears well-developed and well-nourished. No distress.  HENT:  Head: Normocephalic and atraumatic.  Right Ear: Hearing normal.  Left Ear: Hearing normal.  Nose: Nose normal.  Mouth/Throat: Oropharynx is clear and moist and mucous membranes are normal.  Eyes: Conjunctivae and EOM are normal. Pupils are equal, round, and reactive to light.  Neck: Normal range of motion. Neck supple.  Cardiovascular: Regular rhythm, S1 normal and S2 normal.  Exam reveals no gallop and no friction rub.   No murmur heard. Pulmonary/Chest: Effort normal and breath sounds normal. No respiratory distress. She exhibits no tenderness.  Abdominal: Soft. Normal appearance and bowel sounds are normal. There is no hepatosplenomegaly. There is no tenderness. There is no rebound,  no guarding, no tenderness at McBurney's point and negative Murphy's sign. No hernia.  Musculoskeletal: Normal range of motion.  Neurological: She is alert and oriented to person, place, and time. She has normal strength. No cranial nerve deficit or sensory deficit. Coordination normal. GCS eye subscore is 4. GCS verbal subscore is 5. GCS motor subscore is 6.  Skin: Skin is warm, dry and intact. No rash noted. No cyanosis.  Psychiatric: She has a normal mood and affect. Her speech is normal and behavior is normal. Thought content normal.    ED Course  Procedures (including critical care time)  DIAGNOSTIC STUDIES: Oxygen Saturation is 100% on room air, normal by my interpretation.    COORDINATION OF CARE: 7:28 AM-Discussed treatment plan which includes migraine cocktail with pt at bedside and pt agreed to plan.     Labs Reviewed - No data to display No results found.  Diagnosis: Migraine  MDM  Patient presents to ER for migraine headache. Patient was seen several days ago with a similar migraine headache. Headache today is consistent with her chronic migraines, no unusual features. Does not require any imaging or workup.    I personally performed the services described in this documentation, which was scribed in my presence. The recorded information has been reviewed and is accurate.    Gilda Crease, MD 09/15/12 [phone]

## 2012-09-16 ENCOUNTER — Telehealth: Payer: Self-pay | Admitting: *Deleted

## 2012-09-16 NOTE — Telephone Encounter (Signed)
Pt called requesting to be seen today due to "severe abdominal pain." Pt states had a hysterectomy in 2007 and see Dr. Emelda Fear in our office. Pt given an appt for tomorrow morning due to no available appt today with Dr. Emelda Fear. Pt encouraged to go to ER if unable to endure pain until appt tomorrow. Pt verbalized understanding.

## 2012-09-17 ENCOUNTER — Ambulatory Visit: Payer: Self-pay | Admitting: Adult Health

## 2012-09-17 ENCOUNTER — Encounter: Payer: Self-pay | Admitting: *Deleted

## 2012-09-20 ENCOUNTER — Encounter (HOSPITAL_COMMUNITY): Payer: Self-pay | Admitting: Emergency Medicine

## 2012-09-20 ENCOUNTER — Emergency Department (HOSPITAL_COMMUNITY)
Admission: EM | Admit: 2012-09-20 | Discharge: 2012-09-20 | Disposition: A | Discharge status: Home / Self Care | Payer: BC Managed Care – PPO | Attending: Emergency Medicine | Admitting: Emergency Medicine

## 2012-09-20 DIAGNOSIS — Z87448 Personal history of other diseases of urinary system: Secondary | ICD-10-CM | POA: Insufficient documentation

## 2012-09-20 DIAGNOSIS — Z862 Personal history of diseases of the blood and blood-forming organs and certain disorders involving the immune mechanism: Secondary | ICD-10-CM | POA: Insufficient documentation

## 2012-09-20 DIAGNOSIS — R109 Unspecified abdominal pain: Secondary | ICD-10-CM | POA: Insufficient documentation

## 2012-09-20 DIAGNOSIS — R112 Nausea with vomiting, unspecified: Secondary | ICD-10-CM | POA: Insufficient documentation

## 2012-09-20 DIAGNOSIS — Z8742 Personal history of other diseases of the female genital tract: Secondary | ICD-10-CM | POA: Insufficient documentation

## 2012-09-20 DIAGNOSIS — G8929 Other chronic pain: Secondary | ICD-10-CM | POA: Insufficient documentation

## 2012-09-20 DIAGNOSIS — Z8639 Personal history of other endocrine, nutritional and metabolic disease: Secondary | ICD-10-CM | POA: Insufficient documentation

## 2012-09-20 DIAGNOSIS — Z9851 Tubal ligation status: Secondary | ICD-10-CM | POA: Insufficient documentation

## 2012-09-20 DIAGNOSIS — Z7982 Long term (current) use of aspirin: Secondary | ICD-10-CM | POA: Insufficient documentation

## 2012-09-20 DIAGNOSIS — R51 Headache: Secondary | ICD-10-CM | POA: Insufficient documentation

## 2012-09-20 DIAGNOSIS — F17200 Nicotine dependence, unspecified, uncomplicated: Secondary | ICD-10-CM | POA: Insufficient documentation

## 2012-09-20 DIAGNOSIS — Z79899 Other long term (current) drug therapy: Secondary | ICD-10-CM | POA: Insufficient documentation

## 2012-09-20 DIAGNOSIS — F411 Generalized anxiety disorder: Secondary | ICD-10-CM | POA: Insufficient documentation

## 2012-09-20 LAB — CBC WITH DIFFERENTIAL/PLATELET
Basophils Relative: 0 % (ref 0–1)
Eosinophils Absolute: 0.3 10*3/uL (ref 0.0–0.7)
Eosinophils Relative: 3 % (ref 0–5)
HCT: 36.4 % (ref 36.0–46.0)
Hemoglobin: 12.6 g/dL (ref 12.0–15.0)
MCH: 34.6 pg — ABNORMAL HIGH (ref 26.0–34.0)
MCHC: 34.6 g/dL (ref 30.0–36.0)
Monocytes Absolute: 0.5 10*3/uL (ref 0.1–1.0)
Monocytes Relative: 5 % (ref 3–12)
Neutrophils Relative %: 65 % (ref 43–77)

## 2012-09-20 LAB — COMPREHENSIVE METABOLIC PANEL
Albumin: 3.6 g/dL (ref 3.5–5.2)
BUN: 7 mg/dL (ref 6–23)
Creatinine, Ser: 0.57 mg/dL (ref 0.50–1.10)
Total Protein: 6.7 g/dL (ref 6.0–8.3)

## 2012-09-20 LAB — URINALYSIS, ROUTINE W REFLEX MICROSCOPIC
Hgb urine dipstick: NEGATIVE
Specific Gravity, Urine: <1.005 — ABNORMAL LOW (ref 1.005–1.030)
Urobilinogen, UA: 0.2 mg/dL (ref 0.0–1.0)

## 2012-09-20 LAB — RAPID URINE DRUG SCREEN, HOSP PERFORMED
Cocaine: NOT DETECTED
Opiates: NOT DETECTED
Tetrahydrocannabinol: NOT DETECTED

## 2012-09-20 LAB — LIPASE, BLOOD: Lipase: 21 U/L (ref 11–59)

## 2012-09-20 MED ORDER — LORAZEPAM 2 MG/ML IJ SOLN
1.0000 mg | Freq: Once | INTRAMUSCULAR | Status: AC
  Administered 2012-09-20: 1 mg via INTRAVENOUS
  Filled 2012-09-20: qty 1

## 2012-09-20 MED ORDER — PROMETHAZINE HCL 25 MG PO TABS: 25.0000 mg | ORAL_TABLET | Freq: Three times a day (TID) | ORAL | Status: DC | PRN

## 2012-09-20 MED ORDER — SODIUM CHLORIDE 0.9 % IV SOLN
1000.0000 mL | Freq: Once | INTRAVENOUS | Status: AC
  Administered 2012-09-20: 1000 mL via INTRAVENOUS

## 2012-09-20 MED ORDER — METOCLOPRAMIDE HCL 5 MG/ML IJ SOLN
10.0000 mg | Freq: Once | INTRAMUSCULAR | Status: AC
  Administered 2012-09-20: 10 mg via INTRAVENOUS
  Filled 2012-09-20: qty 2

## 2012-09-20 MED ORDER — DIPHENHYDRAMINE HCL 50 MG/ML IJ SOLN
25.0000 mg | Freq: Once | INTRAMUSCULAR | Status: AC
  Administered 2012-09-20: 25 mg via INTRAVENOUS
  Filled 2012-09-20: qty 1

## 2012-09-20 MED ORDER — SODIUM CHLORIDE 0.9 % IV SOLN: 1000.0000 mL | INTRAVENOUS | Status: DC

## 2012-09-20 MED ORDER — PROMETHAZINE HCL 25 MG/ML IJ SOLN
12.5000 mg | Freq: Once | INTRAMUSCULAR | Status: AC
  Administered 2012-09-20: 12.5 mg via INTRAVENOUS
  Filled 2012-09-20: qty 1

## 2012-09-20 MED ORDER — MAGNESIUM SULFATE 40 MG/ML IJ SOLN
2.0000 g | Freq: Once | INTRAMUSCULAR | Status: AC
  Administered 2012-09-20: 2 g via INTRAVENOUS
  Filled 2012-09-20: qty 50

## 2012-09-20 MED ORDER — ONDANSETRON HCL 4 MG/2ML IJ SOLN
4.0000 mg | Freq: Once | INTRAMUSCULAR | Status: AC
  Administered 2012-09-20: 4 mg via INTRAVENOUS
  Filled 2012-09-20: qty 2

## 2012-09-20 NOTE — ED Notes (Signed)
RN at bedside

## 2012-09-20 NOTE — ED Provider Notes (Signed)
History  This chart was scribed for Ward Givens, MD by Bennett Scrape, ED Scribe. This patient was seen in room APA08/APA08 and the patient's care was started at 2:12 PM.  CSN: [id number]  Arrival date & time 09/20/12  1406   First MD Initiated Contact with Patient 09/20/12 1412     Chief Complaint  Patient presents with  . Flank Pain    Patient is a 32 y.o. female presenting with abdominal pain. The history is provided by the patient. No language interpreter was used.  Abdominal Pain This is a chronic problem. The current episode started yesterday. The problem occurs constantly. The problem has not changed since onset.Associated symptoms include abdominal pain and headaches. She has tried nothing for the symptoms.    HPI Comments: [name] is a 32 y.o. female who presents to the Emergency Department complaining of intermittent RUQ abdominal pain that radiates across her back, into the RLQ and into her left flank that started this morning with associated nausea and emesis. She reports 6 to 7 episodes of emesis last night and 2 episodes today. She originally attributed the emesis to possible bad watermelon but became concerned when the pain started. She reports prior milder episodes with no diagnoses. She states that she was seen by a GI, Dr. Kendell Bane, but never followed up. She denies changes in urination, diarrhea, vaginal discharge or bleeding as associated symptoms. She has a h/o hysterectomy with only the left ovary still in place. She states that she used to take xanax but ran out and has not followed up since. She denies being on any daily medication currently. She is a 1 ppd smoker and is an occasional alcohol user. She states that she has not used cocaine in over one year.  PCP is Dr. Loney Hering GI is Dr. Kendell Bane  Past Medical History  Diagnosis Date  . Hypokalemia   . Narcotic abuse     5 yrs ago lortab, off few yrs  . Anxiety   . Chronic pain   . Endometriosis   . Renal  disorder Cyst on Kidney   Past Surgical History  Procedure Laterality Date  . Tonsillectomy    . Cholecystectomy  2010  . Tubal ligation    . Carpal tunnel release      both  . Abdominal hysterectomy      left ovary remains   Family History  Problem Relation Age of Onset  . Stroke Father   . Diverticulitis Father   . Heart disease    . Arthritis    . Diabetes    . Kidney disease     History  Substance Use Topics  . Smoking status: Current Every Day Smoker -- 1.50 packs/day for 18 years    Types: Cigarettes  . Smokeless tobacco: Not on file  . Alcohol Use: Yes     Comment: rarely  stay at home mother  OB History   Grav Para Term Preterm Abortions TAB SAB Ect Mult Living   4 4 4       4      Review of Systems  Gastrointestinal: Positive for nausea, vomiting and abdominal pain. Negative for diarrhea.  Genitourinary: Negative for dysuria, hematuria, vaginal bleeding and vaginal discharge.  Neurological: Positive for headaches.  All other systems reviewed and are negative.    Allergies  Fioricet-codeine; Ibuprofen; Ketorolac tromethamine; and Divalproex sodium  Home Medications   Current Outpatient Rx  Name  Route  Sig  Dispense  Refill  .  acetaminophen (TYLENOL) 500 MG tablet   Oral   Take 1,000 mg by mouth every 6 (six) hours as needed for pain.         Marland Kitchen aspirin-acetaminophen-caffeine (EXCEDRIN MIGRAINE) 250-250-65 MG per tablet   Oral   Take 1 tablet by mouth every 4 (four) hours as needed for pain.          . promethazine (PHENERGAN) 25 MG tablet   Oral   Take 1 tablet (25 mg total) by mouth every 6 (six) hours as needed for nausea.   12 tablet   0    Triage Vitals: BP 110/61  Pulse 82  Temp(Src) 98.7 F (37.1 C) (Oral)  Resp 22  Ht 5\' 1"  (1.549 m)  Wt 120 lb (54.432 kg)  BMI 22.69 kg/m2  SpO2 100%  Vital signs normal    Physical Exam  Nursing note and vitals reviewed. Constitutional: She is oriented to person, place, and time. She  appears well-developed and well-nourished.  Non-toxic appearance. She does not appear ill. No distress.  HENT:  Head: Normocephalic and atraumatic.  Right Ear: External ear normal.  Left Ear: External ear normal.  Nose: Nose normal. No mucosal edema or rhinorrhea.  Mouth/Throat: Oropharynx is clear and moist and mucous membranes are normal. No dental abscesses or edematous.  Eyes: Conjunctivae and EOM are normal. Pupils are equal, round, and reactive to light.  Neck: Normal range of motion and full passive range of motion without pain. Neck supple.  Cardiovascular: Normal rate, regular rhythm and normal heart sounds.  Exam reveals no gallop and no friction rub.   No murmur heard. Pulmonary/Chest: Effort normal and breath sounds normal. No respiratory distress. She has no wheezes. She has no rhonchi. She has no rales. She exhibits no tenderness and no crepitus.  Abdominal: Soft. Normal appearance and bowel sounds are normal. She exhibits no distension. There is tenderness (mild diffuse tenderness). There is no rebound and no guarding.  Musculoskeletal: Normal range of motion. She exhibits no edema and no tenderness.  Moves all extremities well.   Neurological: She is alert and oriented to person, place, and time. She has normal strength. No cranial nerve deficit.  Skin: Skin is warm, dry and intact. No rash noted. No erythema. No pallor.  Psychiatric: She has a normal mood and affect. Her speech is normal and behavior is normal. Her mood appears not anxious.    ED Course  Procedures (including critical care time)  Medications  0.9 %  sodium chloride infusion (0 mLs Intravenous Stopped 09/20/12 1856)    Followed by  0.9 %  sodium chloride infusion (0 mLs Intravenous Stopped 09/20/12 1913)    Followed by  0.9 %  sodium chloride infusion (0 mLs Intravenous Stopped 09/20/12 2033)  ondansetron (ZOFRAN) injection 4 mg (4 mg Intravenous Given 09/20/12 1430)  metoCLOPramide (REGLAN) injection 10 mg  (10 mg Intravenous Given 09/20/12 1528)  diphenhydrAMINE (BENADRYL) injection 25 mg (25 mg Intravenous Given 09/20/12 1528)  promethazine (PHENERGAN) injection 12.5 mg (12.5 mg Intravenous Given 09/20/12 1528)  magnesium sulfate IVPB 2 g 50 mL (0 g Intravenous Stopped 09/20/12 1913)  LORazepam (ATIVAN) injection 1 mg (1 mg Intravenous Given 09/20/12 1856)  metoCLOPramide (REGLAN) injection 10 mg (10 mg Intravenous Given 09/20/12 2033)  LORazepam (ATIVAN) injection 1 mg (1 mg Intravenous Given 09/20/12 2033)  diphenhydrAMINE (BENADRYL) injection 25 mg (25 mg Intravenous Given 09/20/12 2033)   DIAGNOSTIC STUDIES: Oxygen Saturation is 100% on room air, normal by my interpretation.  COORDINATION OF CARE: 2:53 PM-US on June 12th showed post hysterectomy and right oophorectomy, normal left ovary. CT of abdomen on June 12th shows no kidney stones or inflammation of the bowel  2:55 PM-Discussed treatment plan which includes medications with pt at bedside and pt agreed to plan.   3:50PM-Pt rechecked and reports having a HA described as pounding. She is resting comfortably in the dark. Will allow pt to get more IV fluids and recheck.   This is her 10th ED visit in 6 months, most for either headache or chronic abdominal pain. Review of the [place] narcotics it shows she has gotten 9 narcotic prescriptions since January and got 7 in 2013 including several prescriptions for hydromorphone #30 twice last fall from this ED. Her alprazolam prescriptions which varied between 15-90 at a time seem to have  stopped on April 8. She last received hydromorphone on July 6. She has gotten prescriptions from the [facility] in [place] and Dr. Gerilyn Pilgrim, several recently from a dentist in [place] and several from our ED  20:20 Have discussed with patient she is not getting any narcotics. She is also requesting prescription for ativan when she is discharged. Pt has chronic abdominal pain. On review of her chart she had  called [facility] to be seen on 7/10 and had an appt on the 11th.   Results for orders placed during the hospital encounter of 09/20/12  URINALYSIS, ROUTINE W REFLEX MICROSCOPIC      Result Value Range   Color, Urine YELLOW  YELLOW   APPearance CLEAR  CLEAR   Specific Gravity, Urine <1.005 (*) 1.005 - 1.030   pH 7.0  5.0 - 8.0   Glucose, UA NEGATIVE  NEGATIVE mg/dL   Hgb urine dipstick NEGATIVE  NEGATIVE   Bilirubin Urine NEGATIVE  NEGATIVE   Ketones, ur NEGATIVE  NEGATIVE mg/dL   Protein, ur NEGATIVE  NEGATIVE mg/dL   Urobilinogen, UA 0.2  0.0 - 1.0 mg/dL   Nitrite NEGATIVE  NEGATIVE   Leukocytes, UA NEGATIVE  NEGATIVE  URINE RAPID DRUG SCREEN (HOSP PERFORMED)      Result Value Range   Opiates NONE DETECTED  NONE DETECTED   Cocaine NONE DETECTED  NONE DETECTED   Benzodiazepines POSITIVE (*) NONE DETECTED   Amphetamines NONE DETECTED  NONE DETECTED   Tetrahydrocannabinol NONE DETECTED  NONE DETECTED   Barbiturates NONE DETECTED  NONE DETECTED  CBC WITH DIFFERENTIAL      Result Value Range   WBC 9.9  4.0 - 10.5 K/uL   RBC 3.64 (*) 3.87 - 5.11 MIL/uL   Hemoglobin 12.6  12.0 - 15.0 g/dL   HCT 16.1  09.6 - 04.5 %   MCV 100.0  78.0 - 100.0 fL   MCH 34.6 (*) 26.0 - 34.0 pg   MCHC 34.6  30.0 - 36.0 g/dL   RDW 40.9  81.1 - 91.4 %   Platelets 299  150 - 400 K/uL   Neutrophils Relative % 65  43 - 77 %   Neutro Abs 6.5  1.7 - 7.7 K/uL   Lymphocytes Relative 27  12 - 46 %   Lymphs Abs 2.6  0.7 - 4.0 K/uL   Monocytes Relative 5  3 - 12 %   Monocytes Absolute 0.5  0.1 - 1.0 K/uL   Eosinophils Relative 3  0 - 5 %   Eosinophils Absolute 0.3  0.0 - 0.7 K/uL   Basophils Relative 0  0 - 1 %   Basophils  Absolute 0.0  0.0 - 0.1 K/uL  COMPREHENSIVE METABOLIC PANEL      Result Value Range   Sodium 142  135 - 145 mEq/L   Potassium 3.8  3.5 - 5.1 mEq/L   Chloride 107  96 - 112 mEq/L   CO2 29  19 - 32 mEq/L   Glucose, Bld 99  70 - 99 mg/dL   BUN 7  6 - 23 mg/dL   Creatinine, Ser 1.61   0.50 - 1.10 mg/dL   Calcium 8.7  8.4 - 09.6 mg/dL   Total Protein 6.7  6.0 - 8.3 g/dL   Albumin 3.6  3.5 - 5.2 g/dL   AST 52 (*) 0 - 37 U/L   ALT 98 (*) 0 - 35 U/L   Alkaline Phosphatase 123 (*) 39 - 117 U/L   Total Bilirubin 0.3  0.3 - 1.2 mg/dL   GFR calc non Af Amer >90  >90 mL/min   GFR calc Af Amer >90  >90 mL/min  LIPASE, BLOOD      Result Value Range   Lipase 21  11 - 59 U/L   Laboratory interpretation all normal except minor elevation of LFT's   1. Chronic abdominal pain   2. Chronic headache     New Prescriptions   PROMETHAZINE (PHENERGAN) 25 MG TABLET    Take 1 tablet (25 mg total) by mouth every 8 (eight) hours as needed for nausea.     Plan discharge   Devoria Albe, MD, FACEP    MDM   I personally performed the services described in this documentation, which was scribed in my presence. The recorded information has been reviewed and considered.  Devoria Albe, MD, [facility]    Ward Givens, MD 09/20/12 [phone]

## 2012-09-20 NOTE — ED Notes (Signed)
r flank/r lower abd pain since this morning. Denies diarrhea. N/v. Alert/oreinted.c/o headache

## 2012-09-20 NOTE — ED Notes (Signed)
MD at bedside. 

## 2012-10-12 ENCOUNTER — Emergency Department (HOSPITAL_COMMUNITY)
Admission: EM | Admit: 2012-10-12 | Discharge: 2012-10-12 | Disposition: A | Discharge status: Home / Self Care | Payer: BC Managed Care – PPO | Attending: Emergency Medicine | Admitting: Emergency Medicine

## 2012-10-12 ENCOUNTER — Encounter (HOSPITAL_COMMUNITY): Payer: Self-pay | Admitting: *Deleted

## 2012-10-12 DIAGNOSIS — Z8742 Personal history of other diseases of the female genital tract: Secondary | ICD-10-CM | POA: Insufficient documentation

## 2012-10-12 DIAGNOSIS — F1310 Sedative, hypnotic or anxiolytic abuse, uncomplicated: Secondary | ICD-10-CM | POA: Insufficient documentation

## 2012-10-12 DIAGNOSIS — Z862 Personal history of diseases of the blood and blood-forming organs and certain disorders involving the immune mechanism: Secondary | ICD-10-CM | POA: Insufficient documentation

## 2012-10-12 DIAGNOSIS — G8929 Other chronic pain: Secondary | ICD-10-CM | POA: Insufficient documentation

## 2012-10-12 DIAGNOSIS — M549 Dorsalgia, unspecified: Secondary | ICD-10-CM | POA: Insufficient documentation

## 2012-10-12 DIAGNOSIS — R112 Nausea with vomiting, unspecified: Secondary | ICD-10-CM | POA: Insufficient documentation

## 2012-10-12 DIAGNOSIS — R51 Headache: Secondary | ICD-10-CM | POA: Insufficient documentation

## 2012-10-12 DIAGNOSIS — Z79899 Other long term (current) drug therapy: Secondary | ICD-10-CM | POA: Insufficient documentation

## 2012-10-12 DIAGNOSIS — Z8639 Personal history of other endocrine, nutritional and metabolic disease: Secondary | ICD-10-CM | POA: Insufficient documentation

## 2012-10-12 DIAGNOSIS — Z87448 Personal history of other diseases of urinary system: Secondary | ICD-10-CM | POA: Insufficient documentation

## 2012-10-12 DIAGNOSIS — F411 Generalized anxiety disorder: Secondary | ICD-10-CM | POA: Insufficient documentation

## 2012-10-12 DIAGNOSIS — F17200 Nicotine dependence, unspecified, uncomplicated: Secondary | ICD-10-CM | POA: Insufficient documentation

## 2012-10-12 MED ORDER — DEXAMETHASONE SODIUM PHOSPHATE 10 MG/ML IJ SOLN
10.0000 mg | Freq: Once | INTRAMUSCULAR | Status: AC
  Administered 2012-10-12: 10 mg via INTRAVENOUS
  Filled 2012-10-12: qty 1

## 2012-10-12 MED ORDER — METOCLOPRAMIDE HCL 5 MG/ML IJ SOLN
10.0000 mg | Freq: Once | INTRAMUSCULAR | Status: AC
  Administered 2012-10-12: 10 mg via INTRAVENOUS
  Filled 2012-10-12: qty 2

## 2012-10-12 MED ORDER — DIPHENHYDRAMINE HCL 50 MG/ML IJ SOLN
25.0000 mg | Freq: Once | INTRAMUSCULAR | Status: AC
  Administered 2012-10-12: 25 mg via INTRAVENOUS
  Filled 2012-10-12: qty 1

## 2012-10-12 MED ORDER — SODIUM CHLORIDE 0.9 % IV SOLN
INTRAVENOUS | Status: DC
  Administered 2012-10-12: 18:00:00 via INTRAVENOUS

## 2012-10-12 NOTE — ED Notes (Signed)
Headache onset yesterday, states she took excedrin and vicodin for same without relief. Also has back pain after helping friend move yesterday

## 2012-10-12 NOTE — ED Notes (Signed)
Pt removed own IV and left AMA. Pt reported if she did not receive " dilaudid for her headache,and Vicodin for her back pain, I'm leaving". EDP notified. Pt noted walking out of ED.  Refused to sign out AMA was told " sign it yourself, you didn't help me".

## 2012-10-12 NOTE — ED Provider Notes (Signed)
CSN: [id number]     Arrival date & time 10/12/12  1548 History  This chart was scribed for Flint Melter, MD by Bennett Scrape, ED Scribe. This patient was seen in room APA11/APA11 and the patient's care was started at 4:54 PM.   Chief Complaint  Patient presents with  . Headache    Patient is a 32 y.o. female presenting with headaches. The history is provided by the patient. No language interpreter was used.  Headache Pain location:  Generalized Radiates to:  Does not radiate Severity currently:  10/10 Severity at highest:  10/10 Duration:  1 day Timing:  Constant Associated symptoms: back pain, nausea and vomiting   Associated symptoms: no photophobia     HPI Comments: [name] is a 32 y.o. female who presents to the Emergency Department complaining of diffuse HA since yesterday with associated nausea and one episode of emesis last night. She reports that food smells make her nausea worse and she states that she has been unable to eat or drink since then. She also c/o right lower back pain since helping her friend move yesterday as well. She reports taking Excedrin migraine pills, 600 mg ibuprofen and hydrocodone with no improvement. She denies bowel or urinary incontinence, visual disturbance or leg weakness as associated symptoms. She has a h/o ovarian cysts for the past "few years".   Past Medical History  Diagnosis Date  . Hypokalemia   . Narcotic abuse     5 yrs ago lortab, off few yrs  . Anxiety   . Chronic pain   . Endometriosis   . Renal disorder Cyst on Kidney   Past Surgical History  Procedure Laterality Date  . Tonsillectomy    . Cholecystectomy  2010  . Tubal ligation    . Carpal tunnel release      both  . Abdominal hysterectomy      left ovary remains   Family History  Problem Relation Age of Onset  . Stroke Father   . Diverticulitis Father   . Heart disease    . Arthritis    . Diabetes    . Kidney disease     History  Substance Use Topics   . Smoking status: Current Every Day Smoker -- 1.50 packs/day for 18 years    Types: Cigarettes  . Smokeless tobacco: Not on file  . Alcohol Use: Yes     Comment: rarely   OB History   Grav Para Term Preterm Abortions TAB SAB Ect Mult Living   4 4 4       4      Review of Systems  Eyes: Negative for photophobia and visual disturbance.  Gastrointestinal: Positive for nausea and vomiting.  Musculoskeletal: Positive for back pain.  Neurological: Positive for headaches.  All other systems reviewed and are negative.    Allergies  Fioricet-codeine; Ibuprofen; Ketorolac tromethamine; and Divalproex sodium  Home Medications   Current Outpatient Rx  Name  Route  Sig  Dispense  Refill  . aspirin-acetaminophen-caffeine (EXCEDRIN MIGRAINE) 250-250-65 MG per tablet   Oral   Take 1 tablet by mouth every 4 (four) hours as needed for pain.          Marland Kitchen HYDROcodone-acetaminophen (NORCO) 5-325 MG per tablet      1 tablet. Take 1 tablet by mouth every 6 (six) hours as needed for Pain.         Marland Kitchen HYDROmorphone (DILAUDID) 2 MG tablet   Oral   Take 2 mg  by mouth every 6 (six) hours as needed for pain.          Marland Kitchen acetaminophen (TYLENOL) 500 MG tablet   Oral   Take 1,000 mg by mouth every 6 (six) hours as needed for pain.         Marland Kitchen sulfamethoxazole-trimethoprim (BACTRIM DS) 800-160 MG per tablet   Oral   Take 1 tablet by mouth 2 (two) times daily. Take 1 tablet by mouth 2 times daily for 10 days          Triage Vitals: BP 115/62  Pulse 110  Temp(Src) 98.3 F (36.8 C)  Resp 20  Ht 5\' 1"  (1.549 m)  Wt 125 lb (56.7 kg)  BMI 23.63 kg/m2  SpO2 98%  Physical Exam  Nursing note and vitals reviewed. Constitutional: She is oriented to person, place, and time. She appears well-developed and well-nourished.  HENT:  Head: Normocephalic and atraumatic.  Eyes: Conjunctivae and EOM are normal. Pupils are equal, round, and reactive to light.  Neck: Normal range of motion and phonation  normal. Neck supple.  Cardiovascular: Normal rate, regular rhythm and intact distal pulses.   Pulmonary/Chest: Effort normal and breath sounds normal. She exhibits no tenderness.  Abdominal: Soft. She exhibits no distension. There is no tenderness. There is no guarding.  Musculoskeletal: Normal range of motion.  Right lower lumbar tenderness  Neurological: She is alert and oriented to person, place, and time. She has normal strength. She exhibits normal muscle tone.  Skin: Skin is warm and dry.  Psychiatric: She has a normal mood and affect. Her behavior is normal. Judgment and thought content normal.    ED Course   Procedures (including critical care time)  Medications  0.9 %  sodium chloride infusion ( Intravenous New Bag/Given 10/12/12 1736)  metoCLOPramide (REGLAN) injection 10 mg (10 mg Intravenous Given 10/12/12 1738)  diphenhydrAMINE (BENADRYL) injection 25 mg (25 mg Intravenous Given 10/12/12 1738)  dexamethasone (DECADRON) injection 10 mg (10 mg Intravenous Given 10/12/12 1738)   Patient Vitals for the past 24 hrs:  BP Temp Pulse Resp SpO2 Height Weight  10/12/12 1552 115/62 mmHg 98.3 F (36.8 C) 110 20 98 % 5\' 1"  (1.549 m) 125 lb (56.7 kg)   6:06 PM Reevaluation with update and discussion. After initial assessment and treatment, an updated evaluation reveals she told the nurse. She wants IV, Dilaudid for her headache, and Vicodin for back pain. I declined to give these to her and she stated she wanted to leave. Leonardo Plaia L   DIAGNOSTIC STUDIES: Oxygen Saturation is 98% on room air, normal by my interpretation.    COORDINATION OF CARE: 4:57 PM-Discussed treatment plan which includes non-narcotic medications with pt at bedside and pt agreed to plan.     1. Chronic pain     MDM  Nonspecific headache, and low back pain; with strong evidence for drug seeking behavior. Giving her the benefit of the doubt, she may have a chronic pain syndrome.  She has a PCP, that she can  follow up with to have that assessed, treated and referred out if needed.   Nursing Notes Reviewed/ Care Coordinated, and agree without changes. Applicable Imaging Reviewed.  Interpretation of Laboratory Data incorporated into ED treatment   Plan: Home Medications- None; Home Treatments and Observation- watch for precautions given on the d/c instructions; return here if the recommended treatment, does not improve the symptoms; Recommended follow up- PCP 1 week.    Flint Melter, MD 10/12/12 [phone]

## 2012-10-12 NOTE — ED Notes (Signed)
Pt called out asking for Vicodin for her back pain.  And dilaudid for her headache. EDP notified.

## 2012-10-12 NOTE — ED Notes (Signed)
Pt asking for dilaudid for headache, pt reports this is the only thing that works for her headaches. Pt also asking for nausea medication. Pt denies emesis, and vision changes. Pt lying in dark at this time.

## 2012-10-12 NOTE — ED Notes (Signed)
Pt presents with c/o headache x 24 hrs with nausea. Pt reports has a history of migraines as does her mother.

## 2012-10-25 DIAGNOSIS — R102 Pelvic and perineal pain: Secondary | ICD-10-CM | POA: Insufficient documentation

## 2012-11-15 ENCOUNTER — Encounter (HOSPITAL_COMMUNITY): Payer: Self-pay | Admitting: *Deleted

## 2012-11-15 ENCOUNTER — Emergency Department (HOSPITAL_COMMUNITY)
Admission: EM | Admit: 2012-11-15 | Discharge: 2012-11-15 | Disposition: A | Discharge status: Home / Self Care | Payer: BC Managed Care – PPO | Attending: Emergency Medicine | Admitting: Emergency Medicine

## 2012-11-15 DIAGNOSIS — J0100 Acute maxillary sinusitis, unspecified: Secondary | ICD-10-CM | POA: Insufficient documentation

## 2012-11-15 DIAGNOSIS — M545 Low back pain, unspecified: Secondary | ICD-10-CM | POA: Insufficient documentation

## 2012-11-15 DIAGNOSIS — Z8659 Personal history of other mental and behavioral disorders: Secondary | ICD-10-CM | POA: Insufficient documentation

## 2012-11-15 DIAGNOSIS — Z8639 Personal history of other endocrine, nutritional and metabolic disease: Secondary | ICD-10-CM | POA: Insufficient documentation

## 2012-11-15 DIAGNOSIS — F17200 Nicotine dependence, unspecified, uncomplicated: Secondary | ICD-10-CM | POA: Insufficient documentation

## 2012-11-15 DIAGNOSIS — Z8742 Personal history of other diseases of the female genital tract: Secondary | ICD-10-CM | POA: Insufficient documentation

## 2012-11-15 DIAGNOSIS — Z862 Personal history of diseases of the blood and blood-forming organs and certain disorders involving the immune mechanism: Secondary | ICD-10-CM | POA: Insufficient documentation

## 2012-11-15 DIAGNOSIS — G8929 Other chronic pain: Secondary | ICD-10-CM | POA: Insufficient documentation

## 2012-11-15 DIAGNOSIS — Z87448 Personal history of other diseases of urinary system: Secondary | ICD-10-CM | POA: Insufficient documentation

## 2012-11-15 MED ORDER — DEXAMETHASONE SODIUM PHOSPHATE 10 MG/ML IJ SOLN
10.0000 mg | Freq: Once | INTRAMUSCULAR | Status: AC
  Administered 2012-11-15: 10 mg via INTRAMUSCULAR
  Filled 2012-11-15: qty 1

## 2012-11-15 MED ORDER — HYDROCOD POLST-CHLORPHEN POLST 10-8 MG/5ML PO LQCR: 5.0000 mL | Freq: Two times a day (BID) | ORAL | Status: DC

## 2012-11-15 MED ORDER — AMOXICILLIN 500 MG PO CAPS: 500.0000 mg | ORAL_CAPSULE | Freq: Three times a day (TID) | ORAL | Status: DC

## 2012-11-15 MED ORDER — HYDROCOD POLST-CHLORPHEN POLST 10-8 MG/5ML PO LQCR
5.0000 mL | Freq: Once | ORAL | Status: AC
  Administered 2012-11-15: 5 mL via ORAL
  Filled 2012-11-15: qty 5

## 2012-11-15 MED ORDER — AMOXICILLIN 250 MG PO CAPS
500.0000 mg | ORAL_CAPSULE | Freq: Once | ORAL | Status: AC
  Administered 2012-11-15: 500 mg via ORAL
  Filled 2012-11-15: qty 2

## 2012-11-15 MED ORDER — DIPHENHYDRAMINE HCL 50 MG/ML IJ SOLN
50.0000 mg | Freq: Once | INTRAMUSCULAR | Status: AC
  Administered 2012-11-15: 50 mg via INTRAMUSCULAR
  Filled 2012-11-15: qty 1

## 2012-11-15 NOTE — ED Notes (Signed)
Headache for 4 days, low back pain, cough . Alert,

## 2012-11-15 NOTE — ED Provider Notes (Signed)
Medical screening examination/treatment/procedure(s) were performed by non-physician practitioner and as supervising physician I was immediately available for consultation/collaboration.   [name], MD 11/15/12 1515

## 2012-11-15 NOTE — ED Provider Notes (Signed)
CSN: [id number]     Arrival date & time 11/15/12  1053 History   First MD Initiated Contact with Patient 11/15/12 1109     Chief Complaint  Patient presents with  . Back Pain  . Headache   (Consider location/radiation/quality/duration/timing/severity/associated sxs/prior Treatment) HPI Comments: [name] is a 32 y.o. Female presenting with multiple complaints including nasal congestion, facial pain and headache, postnasal drip associated with sore throat and cough which has been productive of green sputum.  She denies chest pain or shortness of breath, has had subjective fevers.  Also has a history of chronic low back pain which has been worse with the frequent coughing with her current illness.  She states her and her family right each last week, some caught a cold and has spreading to the rest of the household.  She does have a history of chronic low back pain and is currently under the care of Dr. Renae Fickle in [place] for this condition.  She denies weakness or numbness in her lower extremities and her back pain has not worsened than her normal pain.  She denies urinary or fecal incontinence or retention.  Additionally he has had no dizziness, visual changes, denies facial swelling, earache or ear drainage.  She has multiple OTC cough remedies without relief.     The history is provided by the patient.    Past Medical History  Diagnosis Date  . Hypokalemia   . Narcotic abuse     5 yrs ago lortab, off few yrs  . Anxiety   . Chronic pain   . Endometriosis   . Renal disorder Cyst on Kidney   Past Surgical History  Procedure Laterality Date  . Tonsillectomy    . Cholecystectomy  2010  . Tubal ligation    . Carpal tunnel release      both  . Abdominal hysterectomy      left ovary remains   Family History  Problem Relation Age of Onset  . Stroke Father   . Diverticulitis Father   . Heart disease    . Arthritis    . Diabetes    . Kidney disease     History  Substance Use  Topics  . Smoking status: Current Every Day Smoker -- 1.50 packs/day for 18 years    Types: Cigarettes  . Smokeless tobacco: Not on file  . Alcohol Use: Yes     Comment: rarely   OB History   Grav Para Term Preterm Abortions TAB SAB Ect Mult Living   4 4 4       4      Review of Systems  Constitutional: Negative for fever.  HENT: Positive for congestion, rhinorrhea, postnasal drip and sinus pressure. Negative for ear pain, sore throat, facial swelling, neck pain and neck stiffness.   Eyes: Negative.   Respiratory: Positive for cough. Negative for chest tightness and shortness of breath.   Cardiovascular: Negative for chest pain.  Gastrointestinal: Negative for nausea, vomiting and abdominal pain.  Genitourinary: Negative.   Musculoskeletal: Positive for back pain. Negative for joint swelling and arthralgias.  Skin: Negative.  Negative for rash and wound.  Neurological: Negative for dizziness, weakness, light-headedness, numbness and headaches.  Psychiatric/Behavioral: Negative.     Allergies  Fioricet-codeine; Ibuprofen; Ketorolac tromethamine; and Divalproex sodium  Home Medications   Current Outpatient Rx  Name  Route  Sig  Dispense  Refill  . aspirin-acetaminophen-caffeine (EXCEDRIN MIGRAINE) 250-250-65 MG per tablet   Oral   Take 1 tablet  by mouth every 4 (four) hours as needed for pain.          Marland Kitchen amoxicillin (AMOXIL) 500 MG capsule   Oral   Take 1 capsule (500 mg total) by mouth 3 (three) times daily.   21 capsule   0   . chlorpheniramine-HYDROcodone (TUSSIONEX PENNKINETIC ER) 10-8 MG/5ML LQCR   Oral   Take 5 mLs by mouth every 12 (twelve) hours.   75 mL   0    BP 93/44  Pulse 70  Temp(Src) 98.2 F (36.8 C) (Oral)  Resp 20  Ht 5\' 1"  (1.549 m)  Wt 120 lb (54.432 kg)  BMI 22.69 kg/m2  SpO2 100% Physical Exam  Nursing note and vitals reviewed. Constitutional: She appears well-developed and well-nourished.  HENT:  Head: Normocephalic.  Right Ear:  Tympanic membrane, external ear and ear canal normal.  Left Ear: Tympanic membrane, external ear and ear canal normal.  Nose: Mucosal edema and rhinorrhea present.  Mouth/Throat: No oropharyngeal exudate, posterior oropharyngeal edema, posterior oropharyngeal erythema or tonsillar abscesses.  Eyes: Conjunctivae are normal.  Neck: Normal range of motion. Neck supple.  Cardiovascular: Normal rate and intact distal pulses.   Pedal pulses normal.  Pulmonary/Chest: Effort normal. She has no decreased breath sounds. She has no wheezes. She has no rhonchi. She has no rales.  Abdominal: Soft. Bowel sounds are normal. She exhibits no distension and no mass.  Musculoskeletal: Normal range of motion. She exhibits no edema.       Lumbar back: She exhibits tenderness. She exhibits no swelling, no edema and no spasm.  Neurological: She is alert. She has normal strength. She displays no atrophy and no tremor. No sensory deficit. Gait normal.  Reflex Scores:      Patellar reflexes are 2+ on the right side and 2+ on the left side.      Achilles reflexes are 2+ on the right side and 2+ on the left side. No strength deficit noted in hip and knee flexor and extensor muscle groups.  Ankle flexion and extension intact.  Skin: Skin is warm and dry.  Psychiatric: She has a normal mood and affect.    ED Course  Procedures (including critical care time) Labs Review Labs Reviewed - No data to display Imaging Review No results found.  MDM   1. Sinusitis, acute maxillary   2. Chronic back pain    Patient was given injections for treatment of migraine headache, although she does state headache today is not her typical migraine, pain more in her face rather than true headache, she was desirous for injections, given Benadryl and Decadron.  She was also started on amoxicillin for her sinusitis.  Also prescribed Tussionex for assistance with cough and pain.  Encouraged rest, increase fluids, Sudafed which she states  she has at home.  Followup with PCP if symptoms are not improving over the next several days.    Burgess Amor, PA-C 11/15/12 1235

## 2012-11-15 NOTE — ED Notes (Signed)
Chronic back pain, headache x 3-4 days, chest congestion.

## 2012-12-03 ENCOUNTER — Emergency Department (HOSPITAL_COMMUNITY)
Admission: EM | Admit: 2012-12-03 | Discharge: 2012-12-03 | Disposition: A | Discharge status: Home / Self Care | Payer: BC Managed Care – PPO | Attending: Emergency Medicine | Admitting: Emergency Medicine

## 2012-12-03 ENCOUNTER — Encounter (HOSPITAL_COMMUNITY): Payer: Self-pay

## 2012-12-03 DIAGNOSIS — Z8742 Personal history of other diseases of the female genital tract: Secondary | ICD-10-CM | POA: Insufficient documentation

## 2012-12-03 DIAGNOSIS — Z87448 Personal history of other diseases of urinary system: Secondary | ICD-10-CM | POA: Insufficient documentation

## 2012-12-03 DIAGNOSIS — M545 Low back pain, unspecified: Secondary | ICD-10-CM | POA: Insufficient documentation

## 2012-12-03 DIAGNOSIS — Z8639 Personal history of other endocrine, nutritional and metabolic disease: Secondary | ICD-10-CM | POA: Insufficient documentation

## 2012-12-03 DIAGNOSIS — Z862 Personal history of diseases of the blood and blood-forming organs and certain disorders involving the immune mechanism: Secondary | ICD-10-CM | POA: Insufficient documentation

## 2012-12-03 DIAGNOSIS — F411 Generalized anxiety disorder: Secondary | ICD-10-CM | POA: Insufficient documentation

## 2012-12-03 DIAGNOSIS — M5430 Sciatica, unspecified side: Secondary | ICD-10-CM | POA: Insufficient documentation

## 2012-12-03 DIAGNOSIS — G8929 Other chronic pain: Secondary | ICD-10-CM | POA: Insufficient documentation

## 2012-12-03 DIAGNOSIS — F17200 Nicotine dependence, unspecified, uncomplicated: Secondary | ICD-10-CM | POA: Insufficient documentation

## 2012-12-03 DIAGNOSIS — M5442 Lumbago with sciatica, left side: Secondary | ICD-10-CM

## 2012-12-03 MED ORDER — OXYCODONE-ACETAMINOPHEN 7.5-325 MG PO TABS: 1.0000 | ORAL_TABLET | Freq: Four times a day (QID) | ORAL | Status: DC | PRN

## 2012-12-03 NOTE — ED Notes (Signed)
Chronic low back pain, worse today .

## 2012-12-03 NOTE — ED Notes (Signed)
Pt reports chronic back pain, and pain has returned thinks she may have injured when moving her son into car for his visit to the ed a few days ago. Had mri last week and was seen by her back doctor yesterday. To have epidural injections soon.

## 2012-12-05 NOTE — ED Provider Notes (Signed)
CSN: [id number]     Arrival date & time 12/03/12  1155 History   First MD Initiated Contact with Patient 12/03/12 1323     Chief Complaint  Patient presents with  . Back Pain   (Consider location/radiation/quality/duration/timing/severity/associated sxs/prior Treatment) HPI Comments: [name] is a 32 y.o. Female presenting with acute on chronic low back pain which has which has been present for the past 2days.   Patient denies any new injury specifically, but states she may have stressed it when helping to move her son into her car 3 days ago when he injured his foot.  There is radiation into her right lower extremity which is consistent with prior flares of her pain.  She had a recent mri showing ddd and is scheduled to see Dr. Modesto Charon (referred to by Dr. Renae Fickle)  next week for anticipation of steroid injections.  There has been no weakness or numbness in the lower extremities and no urinary or bowel retention or incontinence.  Patient does not have a history of cancer or IVDU.  She has taken tramadol without relief of symptoms.      The history is provided by the patient.    Past Medical History  Diagnosis Date  . Hypokalemia   . Narcotic abuse     5 yrs ago lortab, off few yrs  . Anxiety   . Chronic pain   . Endometriosis   . Renal disorder Cyst on Kidney   Past Surgical History  Procedure Laterality Date  . Tonsillectomy    . Cholecystectomy  2010  . Tubal ligation    . Carpal tunnel release      both  . Abdominal hysterectomy      left ovary remains   Family History  Problem Relation Age of Onset  . Stroke Father   . Diverticulitis Father   . Heart disease    . Arthritis    . Diabetes    . Kidney disease     History  Substance Use Topics  . Smoking status: Current Every Day Smoker -- 1.50 packs/day for 18 years    Types: Cigarettes  . Smokeless tobacco: Not on file  . Alcohol Use: Yes     Comment: rarely   OB History   Grav Para Term Preterm Abortions TAB  SAB Ect Mult Living   4 4 4       4      Review of Systems  Constitutional: Negative for fever.  Respiratory: Negative for shortness of breath.   Cardiovascular: Negative for chest pain and leg swelling.  Gastrointestinal: Negative for abdominal pain, constipation and abdominal distention.  Genitourinary: Negative for dysuria, urgency, frequency, flank pain and difficulty urinating.  Musculoskeletal: Positive for back pain. Negative for joint swelling and gait problem.  Skin: Negative for rash.  Neurological: Negative for weakness and numbness.    Allergies  Fioricet-codeine; Ibuprofen; Ketorolac tromethamine; and Divalproex sodium  Home Medications   Current Outpatient Rx  Name  Route  Sig  Dispense  Refill  . HYDROcodone-acetaminophen (NORCO) 10-325 MG per tablet   Oral   Take 1 tablet by mouth every 6 (six) hours as needed for pain.         . traMADol (ULTRAM) 50 MG tablet   Oral   Take 100 mg by mouth every 6 (six) hours as needed for pain. Take 2 tablets (100 mg total) by mouth every 6 (six) hours as needed for up to 30 days for Pain.         Marland Kitchen  oxyCODONE-acetaminophen (PERCOCET) 7.5-325 MG per tablet   Oral   Take 1 tablet by mouth every 6 (six) hours as needed for pain.   15 tablet   0    BP 110/61  Pulse 76  Temp(Src) 98.3 F (36.8 C) (Oral)  Resp 18  Ht 5\' 1"  (1.549 m)  Wt 120 lb (54.432 kg)  BMI 22.69 kg/m2  SpO2 100% Physical Exam  Nursing note and vitals reviewed. Constitutional: She appears well-developed and well-nourished.  HENT:  Head: Normocephalic.  Eyes: Conjunctivae are normal.  Neck: Normal range of motion. Neck supple.  Cardiovascular: Normal rate and intact distal pulses.   Pedal pulses normal.  Pulmonary/Chest: Effort normal.  Abdominal: Soft. Bowel sounds are normal. She exhibits no distension and no mass.  Musculoskeletal: Normal range of motion. She exhibits no edema.       Lumbar back: She exhibits tenderness. She exhibits no  swelling, no edema and no spasm.  Right paralumbar ttp.  Neurological: She is alert. She has normal strength. She displays no atrophy and no tremor. No sensory deficit. Gait normal.  Reflex Scores:      Patellar reflexes are 2+ on the right side and 2+ on the left side.      Achilles reflexes are 2+ on the right side and 2+ on the left side. No strength deficit noted in hip and knee flexor and extensor muscle groups.  Ankle flexion and extension intact.  Skin: Skin is warm and dry.  Psychiatric: She has a normal mood and affect.    ED Course  Procedures (including critical care time) Labs Review Labs Reviewed - No data to display Imaging Review No results found.  MDM   1. Low back pain on left side with sciatica    No neuro deficit on exam or by history to suggest emergent or surgical presentation.  Also discussed worsened sx that should prompt immediate re-evaluation including distal weakness, bowel/bladder retention/incontinence. Few oxycodone prescribed.  Discussed with patient that her orthopedic specialists will need to treat her chronic pain.  She was encouraged to f/u with Dr. Renae Fickle and Dr. Modesto Charon as is currently planned.    Burgess Amor, PA-C 12/05/12 2225

## 2012-12-06 NOTE — ED Provider Notes (Signed)
Medical screening examination/treatment/procedure(s) were performed by non-physician practitioner and as supervising physician I was immediately available for consultation/collaboration.  Donnetta Hutching, MD 12/06/12 [phone]

## 2012-12-08 ENCOUNTER — Other Ambulatory Visit: Payer: Self-pay | Admitting: Physical Medicine and Rehabilitation

## 2012-12-08 ENCOUNTER — Emergency Department (HOSPITAL_COMMUNITY)
Admission: EM | Admit: 2012-12-08 | Discharge: 2012-12-08 | Disposition: A | Discharge status: Home / Self Care | Payer: BC Managed Care – PPO | Attending: Emergency Medicine | Admitting: Emergency Medicine

## 2012-12-08 ENCOUNTER — Encounter (HOSPITAL_COMMUNITY): Payer: Self-pay | Admitting: *Deleted

## 2012-12-08 DIAGNOSIS — F17200 Nicotine dependence, unspecified, uncomplicated: Secondary | ICD-10-CM | POA: Insufficient documentation

## 2012-12-08 DIAGNOSIS — Z87448 Personal history of other diseases of urinary system: Secondary | ICD-10-CM | POA: Insufficient documentation

## 2012-12-08 DIAGNOSIS — Z862 Personal history of diseases of the blood and blood-forming organs and certain disorders involving the immune mechanism: Secondary | ICD-10-CM | POA: Insufficient documentation

## 2012-12-08 DIAGNOSIS — Z8742 Personal history of other diseases of the female genital tract: Secondary | ICD-10-CM | POA: Insufficient documentation

## 2012-12-08 DIAGNOSIS — M545 Low back pain, unspecified: Secondary | ICD-10-CM | POA: Insufficient documentation

## 2012-12-08 DIAGNOSIS — Z8639 Personal history of other endocrine, nutritional and metabolic disease: Secondary | ICD-10-CM | POA: Insufficient documentation

## 2012-12-08 DIAGNOSIS — M25569 Pain in unspecified knee: Secondary | ICD-10-CM | POA: Insufficient documentation

## 2012-12-08 DIAGNOSIS — Z8659 Personal history of other mental and behavioral disorders: Secondary | ICD-10-CM | POA: Insufficient documentation

## 2012-12-08 DIAGNOSIS — M549 Dorsalgia, unspecified: Secondary | ICD-10-CM

## 2012-12-08 DIAGNOSIS — G8929 Other chronic pain: Secondary | ICD-10-CM | POA: Insufficient documentation

## 2012-12-08 DIAGNOSIS — M25561 Pain in right knee: Secondary | ICD-10-CM

## 2012-12-08 MED ORDER — OXYCODONE-ACETAMINOPHEN 5-325 MG PO TABS
1.0000 | ORAL_TABLET | Freq: Once | ORAL | Status: AC
  Administered 2012-12-08: 1 via ORAL
  Filled 2012-12-08: qty 1

## 2012-12-08 MED ORDER — CYCLOBENZAPRINE HCL 10 MG PO TABS: 10.0000 mg | ORAL_TABLET | Freq: Three times a day (TID) | ORAL | Status: DC | PRN

## 2012-12-08 NOTE — ED Notes (Signed)
Chronic back and knee pain. Felt a pop to her left knee today and is now hurting.

## 2012-12-09 ENCOUNTER — Emergency Department (HOSPITAL_COMMUNITY)
Admission: EM | Admit: 2012-12-09 | Discharge: 2012-12-09 | Disposition: A | Discharge status: Home / Self Care | Payer: BC Managed Care – PPO | Attending: Emergency Medicine | Admitting: Emergency Medicine

## 2012-12-09 ENCOUNTER — Encounter (HOSPITAL_COMMUNITY): Payer: Self-pay | Admitting: *Deleted

## 2012-12-09 DIAGNOSIS — M5416 Radiculopathy, lumbar region: Secondary | ICD-10-CM

## 2012-12-09 DIAGNOSIS — IMO0002 Reserved for concepts with insufficient information to code with codable children: Secondary | ICD-10-CM | POA: Insufficient documentation

## 2012-12-09 DIAGNOSIS — M79609 Pain in unspecified limb: Secondary | ICD-10-CM | POA: Insufficient documentation

## 2012-12-09 DIAGNOSIS — F17200 Nicotine dependence, unspecified, uncomplicated: Secondary | ICD-10-CM | POA: Insufficient documentation

## 2012-12-09 DIAGNOSIS — Z87448 Personal history of other diseases of urinary system: Secondary | ICD-10-CM | POA: Insufficient documentation

## 2012-12-09 DIAGNOSIS — Z8659 Personal history of other mental and behavioral disorders: Secondary | ICD-10-CM | POA: Insufficient documentation

## 2012-12-09 DIAGNOSIS — Z862 Personal history of diseases of the blood and blood-forming organs and certain disorders involving the immune mechanism: Secondary | ICD-10-CM | POA: Insufficient documentation

## 2012-12-09 DIAGNOSIS — Z8742 Personal history of other diseases of the female genital tract: Secondary | ICD-10-CM | POA: Insufficient documentation

## 2012-12-09 DIAGNOSIS — Z8639 Personal history of other endocrine, nutritional and metabolic disease: Secondary | ICD-10-CM | POA: Insufficient documentation

## 2012-12-09 DIAGNOSIS — G8929 Other chronic pain: Secondary | ICD-10-CM | POA: Insufficient documentation

## 2012-12-09 MED ORDER — DIAZEPAM 5 MG PO TABS
5.0000 mg | ORAL_TABLET | Freq: Once | ORAL | Status: AC
  Administered 2012-12-09: 5 mg via ORAL
  Filled 2012-12-09: qty 1

## 2012-12-09 MED ORDER — DEXAMETHASONE SODIUM PHOSPHATE 4 MG/ML IJ SOLN
10.0000 mg | Freq: Once | INTRAMUSCULAR | Status: AC
  Administered 2012-12-09: 10 mg via INTRAMUSCULAR
  Filled 2012-12-09: qty 3

## 2012-12-09 NOTE — ED Notes (Signed)
Continued pain since visit yesterday

## 2012-12-09 NOTE — ED Provider Notes (Signed)
CSN: [id number]     Arrival date & time 12/09/12  1039 History   First MD Initiated Contact with Patient 12/09/12 1051     Chief Complaint  Patient presents with  . Back Pain  . Leg Pain   (Consider location/radiation/quality/duration/timing/severity/associated sxs/prior Treatment) HPI Comments: [name] is a 32 y.o. female who presents to the Emergency Department complaining of continued low back pain.  She was seen here yesterday for same and states that she had to drive her son's pick up truck yesterday and it has a manual transmission which she states made her back pain worse.  States she has an appt with her orthopedic doctor next week to begin "shots in my back".  She denies abd pain, incontinence of bowel or bladder, or dysuria.    The history is provided by the patient.    Past Medical History  Diagnosis Date  . Hypokalemia   . Narcotic abuse     5 yrs ago lortab, off few yrs  . Anxiety   . Chronic pain   . Endometriosis   . Renal disorder Cyst on Kidney   Past Surgical History  Procedure Laterality Date  . Tonsillectomy    . Cholecystectomy  2010  . Tubal ligation    . Carpal tunnel release      both  . Abdominal hysterectomy      left ovary remains   Family History  Problem Relation Age of Onset  . Stroke Father   . Diverticulitis Father   . Heart disease    . Arthritis    . Diabetes    . Kidney disease     History  Substance Use Topics  . Smoking status: Current Every Day Smoker -- 1.50 packs/day for 18 years    Types: Cigarettes  . Smokeless tobacco: Not on file  . Alcohol Use: Yes     Comment: rarely   OB History   Grav Para Term Preterm Abortions TAB SAB Ect Mult Living   4 4 4       4      Review of Systems  Constitutional: Negative for fever.  Respiratory: Negative for shortness of breath.   Gastrointestinal: Negative for vomiting, abdominal pain and constipation.  Genitourinary: Negative for dysuria, hematuria, flank pain, decreased  urine volume and difficulty urinating.       No perineal numbness or incontinence of urine or feces  Musculoskeletal: Positive for back pain. Negative for joint swelling.  Skin: Negative for rash.  Neurological: Negative for weakness and numbness.  All other systems reviewed and are negative.    Allergies  Fioricet-codeine; Ibuprofen; Ketorolac tromethamine; and Divalproex sodium  Home Medications   Current Outpatient Rx  Name  Route  Sig  Dispense  Refill  . cyclobenzaprine (FLEXERIL) 10 MG tablet   Oral   Take 1 tablet (10 mg total) by mouth 3 (three) times daily as needed.   21 tablet   0   . HYDROcodone-acetaminophen (NORCO) 10-325 MG per tablet   Oral   Take 1 tablet by mouth every 6 (six) hours as needed for pain.         Marland Kitchen oxyCODONE-acetaminophen (PERCOCET) 7.5-325 MG per tablet   Oral   Take 1 tablet by mouth every 6 (six) hours as needed for pain.   15 tablet   0    BP 116/79  Pulse 69  Temp(Src) 97.9 F (36.6 C) (Oral)  Resp 16  SpO2 98% Physical Exam  Nursing note  and vitals reviewed. Constitutional: She is oriented to person, place, and time. She appears well-developed and well-nourished. No distress.  HENT:  Head: Normocephalic and atraumatic.  Neck: Normal range of motion. Neck supple.  Cardiovascular: Normal rate, regular rhythm, normal heart sounds and intact distal pulses.   No murmur heard. Pulmonary/Chest: Effort normal and breath sounds normal. No respiratory distress.  Abdominal: Soft. She exhibits no distension. There is no tenderness.  Musculoskeletal: She exhibits tenderness. She exhibits no edema.       Lumbar back: She exhibits tenderness and pain. She exhibits normal range of motion, no swelling, no deformity, no laceration and normal pulse.  ttp of the left lumbar spine and  paraspinal muscles.  DP pulses are brisk and symmetrical.  Distal sensation intact.  Hip Flexors/Extensors are intact  Neurological: She is alert and oriented to  person, place, and time. She has normal strength. No sensory deficit. She exhibits normal muscle tone. Coordination and gait normal.  Reflex Scores:      Patellar reflexes are 2+ on the right side and 2+ on the left side.      Achilles reflexes are 2+ on the right side and 2+ on the left side. Skin: Skin is warm and dry. No rash noted.    ED Course  Procedures (including critical care time) Labs Review Labs Reviewed - No data to display Imaging Review No results found.  MDM    Patient also seen here yesterday for back pain and knee pain.  Today, reports pain has not improved and her orthopedic physician will not see her until next Wednesday.  Patient has multiple Ed visits for chronic pain issues including back pain.  Was reviewed on  narcotic database and she has received multiple narcotic prescriptions.  Sx's are similar to previous visit.  No concerning emergent neurological sx's.  Patient again advised that narcotics were not indicated at this time.  Shelvy Perazzo L. Trisha Mangle, PA-C 12/09/12 2039

## 2012-12-09 NOTE — ED Provider Notes (Signed)
CSN: [id number]     Arrival date & time 12/08/12  1225 History   First MD Initiated Contact with Patient 12/08/12 1250     Chief Complaint  Patient presents with  . Back Pain  . Knee Pain   (Consider location/radiation/quality/duration/timing/severity/associated sxs/prior Treatment) Patient is a 32 y.o. female presenting with back pain and knee pain. The history is provided by the patient.  Back Pain Location:  Lumbar spine Quality:  Shooting and aching Radiates to:  L thigh, L posterior upper leg, L knee and L foot Pain severity:  Mild Pain is:  Same all the time Onset quality:  Gradual Timing:  Constant Progression:  Unchanged Chronicity:  Chronic Context: not falling, not lifting heavy objects, not recent injury and not twisting   Relieved by:  Nothing Worsened by:  Standing, twisting, movement, ambulation and bending Ineffective treatments:  OTC medications and lying down Associated symptoms: leg pain   Associated symptoms: no abdominal pain, no abdominal swelling, no bladder incontinence, no bowel incontinence, no chest pain, no dysuria, no fever, no headaches, no numbness, no paresthesias, no pelvic pain, no perianal numbness, no tingling and no weakness   Associated symptoms comment:  Bilateral knee pain.  Had "cortisone shots" in both knees recently.  Left knee pain worse than right Knee Pain Associated symptoms: back pain   Associated symptoms: no fever     Past Medical History  Diagnosis Date  . Hypokalemia   . Narcotic abuse     5 yrs ago lortab, off few yrs  . Anxiety   . Chronic pain   . Endometriosis   . Renal disorder Cyst on Kidney   Past Surgical History  Procedure Laterality Date  . Tonsillectomy    . Cholecystectomy  2010  . Tubal ligation    . Carpal tunnel release      both  . Abdominal hysterectomy      left ovary remains   Family History  Problem Relation Age of Onset  . Stroke Father   . Diverticulitis Father   . Heart disease    .  Arthritis    . Diabetes    . Kidney disease     History  Substance Use Topics  . Smoking status: Current Every Day Smoker -- 1.50 packs/day for 18 years    Types: Cigarettes  . Smokeless tobacco: Not on file  . Alcohol Use: Yes     Comment: rarely   OB History   Grav Para Term Preterm Abortions TAB SAB Ect Mult Living   4 4 4       4      Review of Systems  Constitutional: Negative for fever.  Respiratory: Negative for shortness of breath.   Cardiovascular: Negative for chest pain.  Gastrointestinal: Negative for vomiting, abdominal pain, constipation and bowel incontinence.  Genitourinary: Negative for bladder incontinence, dysuria, hematuria, flank pain, decreased urine volume, difficulty urinating and pelvic pain.       No perineal numbness or incontinence of urine or feces  Musculoskeletal: Positive for back pain. Negative for joint swelling.  Skin: Negative for rash.  Neurological: Negative for tingling, weakness, numbness, headaches and paresthesias.  All other systems reviewed and are negative.    Allergies  Fioricet-codeine; Ibuprofen; Ketorolac tromethamine; and Divalproex sodium  Home Medications   Current Outpatient Rx  Name  Route  Sig  Dispense  Refill  . cyclobenzaprine (FLEXERIL) 10 MG tablet   Oral   Take 1 tablet (10 mg total) by mouth 3 (  three) times daily as needed.   21 tablet   0   . HYDROcodone-acetaminophen (NORCO) 10-325 MG per tablet   Oral   Take 1 tablet by mouth every 6 (six) hours as needed for pain.         Marland Kitchen oxyCODONE-acetaminophen (PERCOCET) 7.5-325 MG per tablet   Oral   Take 1 tablet by mouth every 6 (six) hours as needed for pain.   15 tablet   0    BP 101/54  Pulse 102  Temp(Src) 99 F (37.2 C) (Oral)  Resp 16  Ht 5\' 1"  (1.549 m)  Wt 120 lb (54.432 kg)  BMI 22.69 kg/m2  SpO2 100% Physical Exam  Nursing note and vitals reviewed. Constitutional: She is oriented to person, place, and time. She appears well-developed  and well-nourished. No distress.  HENT:  Head: Normocephalic and atraumatic.  Neck: Normal range of motion. Neck supple.  Cardiovascular: Normal rate, regular rhythm, normal heart sounds and intact distal pulses.   No murmur heard. Pulmonary/Chest: Effort normal and breath sounds normal. No respiratory distress.  Abdominal: Soft. She exhibits no distension. There is no tenderness.  Musculoskeletal: She exhibits tenderness. She exhibits no edema.       Lumbar back: She exhibits tenderness and pain. She exhibits normal range of motion, no swelling, no deformity, no laceration and normal pulse.  ttp of the left lumbar spine and paraspinal muscles.  DP pulses are brisk and symmetrical.  Distal sensation intact.  Hip Flexors/Extensors are intact.  Diffuse ttp of the anterior left knee w/o effusion, step off deformity, or obvious ligament instability.    Neurological: She is alert and oriented to person, place, and time. She has normal strength. No sensory deficit. She exhibits normal muscle tone. Coordination and gait normal.  Reflex Scores:      Patellar reflexes are 2+ on the right side and 2+ on the left side.      Achilles reflexes are 2+ on the right side and 2+ on the left side. Skin: Skin is warm and dry. No rash noted.    ED Course  Procedures (including critical care time) Labs Review Labs Reviewed - No data to display Imaging Review No results found.  MDM   1. Back pain   2. Knee pain, bilateral    Patient has multiple ED visits and multiple narcotic prescriptions on the [place] narcotics database.  Has ttp of the left lumbar paraspinal muscles . Pain likely related to lumbar radiculopathy.  I have addressed her pain here in the dept, and advised patient that further narcotics were not indicated at this time.  No concerning sx's for emergent neurological or infectious process. Ambulates in the dept with steady gait.     Zsazsa Bahena L. Trisha Mangle, PA-C 12/09/12 2031

## 2012-12-10 NOTE — ED Provider Notes (Signed)
Medical screening examination/treatment/procedure(s) were performed by non-physician practitioner and as supervising physician I was immediately available for consultation/collaboration.    Vida Roller, MD 12/10/12 0700

## 2012-12-11 NOTE — ED Provider Notes (Signed)
Medical screening examination/treatment/procedure(s) were performed by non-physician practitioner and as supervising physician I was immediately available for consultation/collaboration.  Lyanne Co, MD 12/11/12 [phone]

## 2012-12-15 ENCOUNTER — Ambulatory Visit
Admission: RE | Admit: 2012-12-15 | Discharge: 2012-12-15 | Disposition: A | Discharge status: Home / Self Care | Payer: BC Managed Care – PPO | Source: Ambulatory Visit | Attending: Physical Medicine and Rehabilitation | Admitting: Physical Medicine and Rehabilitation

## 2012-12-15 VITALS — BP 113/60 | HR 80

## 2012-12-15 DIAGNOSIS — M545 Low back pain: Secondary | ICD-10-CM

## 2012-12-15 MED ORDER — IOHEXOL 180 MG/ML  SOLN
1.0000 mL | Freq: Once | INTRAMUSCULAR | Status: AC | PRN
  Administered 2012-12-15: 1 mL via EPIDURAL

## 2012-12-15 MED ORDER — DIAZEPAM 5 MG PO TABS
5.0000 mg | ORAL_TABLET | Freq: Once | ORAL | Status: AC
  Administered 2012-12-15: 5 mg via ORAL

## 2012-12-15 MED ORDER — METHYLPREDNISOLONE ACETATE 40 MG/ML INJ SUSP (RADIOLOG
120.0000 mg | Freq: Once | INTRAMUSCULAR | Status: AC
  Administered 2012-12-15: 120 mg via EPIDURAL

## 2012-12-16 DIAGNOSIS — M545 Low back pain, unspecified: Secondary | ICD-10-CM | POA: Insufficient documentation

## 2013-01-02 ENCOUNTER — Emergency Department (HOSPITAL_COMMUNITY)
Admission: EM | Admit: 2013-01-02 | Discharge: 2013-01-02 | Disposition: A | Discharge status: Home / Self Care | Payer: BC Managed Care – PPO | Attending: Emergency Medicine | Admitting: Emergency Medicine

## 2013-01-02 ENCOUNTER — Encounter (HOSPITAL_COMMUNITY): Payer: Self-pay | Admitting: Emergency Medicine

## 2013-01-02 DIAGNOSIS — Z862 Personal history of diseases of the blood and blood-forming organs and certain disorders involving the immune mechanism: Secondary | ICD-10-CM | POA: Insufficient documentation

## 2013-01-02 DIAGNOSIS — Y9389 Activity, other specified: Secondary | ICD-10-CM | POA: Insufficient documentation

## 2013-01-02 DIAGNOSIS — G8929 Other chronic pain: Secondary | ICD-10-CM | POA: Insufficient documentation

## 2013-01-02 DIAGNOSIS — Z8742 Personal history of other diseases of the female genital tract: Secondary | ICD-10-CM | POA: Insufficient documentation

## 2013-01-02 DIAGNOSIS — T23161A Burn of first degree of back of right hand, initial encounter: Secondary | ICD-10-CM

## 2013-01-02 DIAGNOSIS — T23169A Burn of first degree of back of unspecified hand, initial encounter: Secondary | ICD-10-CM | POA: Insufficient documentation

## 2013-01-02 DIAGNOSIS — X118XXA Contact with other hot tap-water, initial encounter: Secondary | ICD-10-CM | POA: Insufficient documentation

## 2013-01-02 DIAGNOSIS — F411 Generalized anxiety disorder: Secondary | ICD-10-CM | POA: Insufficient documentation

## 2013-01-02 DIAGNOSIS — F17200 Nicotine dependence, unspecified, uncomplicated: Secondary | ICD-10-CM | POA: Insufficient documentation

## 2013-01-02 DIAGNOSIS — Z8639 Personal history of other endocrine, nutritional and metabolic disease: Secondary | ICD-10-CM | POA: Insufficient documentation

## 2013-01-02 DIAGNOSIS — Y92009 Unspecified place in unspecified non-institutional (private) residence as the place of occurrence of the external cause: Secondary | ICD-10-CM | POA: Insufficient documentation

## 2013-01-02 DIAGNOSIS — Z87448 Personal history of other diseases of urinary system: Secondary | ICD-10-CM | POA: Insufficient documentation

## 2013-01-02 MED ORDER — OXYCODONE-ACETAMINOPHEN 5-325 MG PO TABS
1.0000 | ORAL_TABLET | Freq: Once | ORAL | Status: AC
  Administered 2013-01-02: 1 via ORAL
  Filled 2013-01-02: qty 1

## 2013-01-02 MED ORDER — SILVER SULFADIAZINE 1 % EX CREA
TOPICAL_CREAM | Freq: Once | CUTANEOUS | Status: AC
  Administered 2013-01-02: 16:00:00 via TOPICAL
  Filled 2013-01-02: qty 50

## 2013-01-02 MED ORDER — OXYCODONE-ACETAMINOPHEN 5-325 MG PO TABS: 1.0000 | ORAL_TABLET | ORAL | Status: DC | PRN
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
## 2013-01-02 NOTE — ED Provider Notes (Signed)
Medical screening examination/treatment/procedure(s) were performed by non-physician practitioner and as supervising physician I was immediately available for consultation/collaboration.  EKG Interpretation   None         Kassity Woodson, MD 01/02/13 1733 

## 2013-01-15 ENCOUNTER — Emergency Department (HOSPITAL_COMMUNITY)
Admission: EM | Admit: 2013-01-15 | Discharge: 2013-01-15 | Disposition: A | Discharge status: Home / Self Care | Payer: BC Managed Care – PPO | Attending: Emergency Medicine | Admitting: Emergency Medicine

## 2013-01-15 ENCOUNTER — Emergency Department (HOSPITAL_COMMUNITY): Payer: BC Managed Care – PPO

## 2013-01-15 DIAGNOSIS — IMO0002 Reserved for concepts with insufficient information to code with codable children: Secondary | ICD-10-CM | POA: Insufficient documentation

## 2013-01-15 DIAGNOSIS — Y9389 Activity, other specified: Secondary | ICD-10-CM | POA: Insufficient documentation

## 2013-01-15 DIAGNOSIS — F411 Generalized anxiety disorder: Secondary | ICD-10-CM | POA: Insufficient documentation

## 2013-01-15 DIAGNOSIS — F17200 Nicotine dependence, unspecified, uncomplicated: Secondary | ICD-10-CM | POA: Insufficient documentation

## 2013-01-15 DIAGNOSIS — Z8639 Personal history of other endocrine, nutritional and metabolic disease: Secondary | ICD-10-CM | POA: Insufficient documentation

## 2013-01-15 DIAGNOSIS — S0083XA Contusion of other part of head, initial encounter: Secondary | ICD-10-CM

## 2013-01-15 DIAGNOSIS — Z862 Personal history of diseases of the blood and blood-forming organs and certain disorders involving the immune mechanism: Secondary | ICD-10-CM | POA: Insufficient documentation

## 2013-01-15 DIAGNOSIS — Z8742 Personal history of other diseases of the female genital tract: Secondary | ICD-10-CM | POA: Insufficient documentation

## 2013-01-15 DIAGNOSIS — Y929 Unspecified place or not applicable: Secondary | ICD-10-CM | POA: Insufficient documentation

## 2013-01-15 DIAGNOSIS — G8929 Other chronic pain: Secondary | ICD-10-CM | POA: Insufficient documentation

## 2013-01-15 DIAGNOSIS — S0003XA Contusion of scalp, initial encounter: Secondary | ICD-10-CM | POA: Insufficient documentation

## 2013-01-15 DIAGNOSIS — Z87448 Personal history of other diseases of urinary system: Secondary | ICD-10-CM | POA: Insufficient documentation

## 2013-01-15 MED ORDER — CELECOXIB 100 MG PO CAPS: 100.0000 mg | ORAL_CAPSULE | Freq: Two times a day (BID) | ORAL | Status: DC

## 2013-01-15 MED ORDER — ACETAMINOPHEN 325 MG PO TABS
650.0000 mg | ORAL_TABLET | Freq: Once | ORAL | Status: AC
  Administered 2013-01-15: 650 mg via ORAL
  Filled 2013-01-15: qty 2
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
## 2013-01-30 ENCOUNTER — Emergency Department (HOSPITAL_COMMUNITY)
Admission: EM | Admit: 2013-01-30 | Discharge: 2013-01-30 | Disposition: A | Discharge status: Home / Self Care | Payer: BC Managed Care – PPO | Attending: Emergency Medicine | Admitting: Emergency Medicine

## 2013-01-30 ENCOUNTER — Encounter (HOSPITAL_COMMUNITY): Payer: Self-pay | Admitting: Emergency Medicine

## 2013-01-30 DIAGNOSIS — F17200 Nicotine dependence, unspecified, uncomplicated: Secondary | ICD-10-CM | POA: Insufficient documentation

## 2013-01-30 DIAGNOSIS — Z8639 Personal history of other endocrine, nutritional and metabolic disease: Secondary | ICD-10-CM | POA: Insufficient documentation

## 2013-01-30 DIAGNOSIS — G8929 Other chronic pain: Secondary | ICD-10-CM | POA: Insufficient documentation

## 2013-01-30 DIAGNOSIS — R21 Rash and other nonspecific skin eruption: Secondary | ICD-10-CM | POA: Insufficient documentation

## 2013-01-30 DIAGNOSIS — Z862 Personal history of diseases of the blood and blood-forming organs and certain disorders involving the immune mechanism: Secondary | ICD-10-CM | POA: Insufficient documentation

## 2013-01-30 DIAGNOSIS — F411 Generalized anxiety disorder: Secondary | ICD-10-CM | POA: Insufficient documentation

## 2013-01-30 DIAGNOSIS — Z791 Long term (current) use of non-steroidal anti-inflammatories (NSAID): Secondary | ICD-10-CM | POA: Insufficient documentation

## 2013-01-30 DIAGNOSIS — Z8742 Personal history of other diseases of the female genital tract: Secondary | ICD-10-CM | POA: Insufficient documentation

## 2013-01-30 DIAGNOSIS — Z87448 Personal history of other diseases of urinary system: Secondary | ICD-10-CM | POA: Insufficient documentation

## 2013-01-30 DIAGNOSIS — R112 Nausea with vomiting, unspecified: Secondary | ICD-10-CM | POA: Insufficient documentation

## 2013-01-30 MED ORDER — ONDANSETRON 8 MG PO TBDP
8.0000 mg | ORAL_TABLET | Freq: Once | ORAL | Status: AC
  Administered 2013-01-30: 8 mg via ORAL

## 2013-01-30 MED ORDER — OXYCODONE-ACETAMINOPHEN 5-325 MG PO TABS
1.0000 | ORAL_TABLET | Freq: Once | ORAL | Status: AC
  Administered 2013-01-30: 1 via ORAL

## 2013-01-30 MED ORDER — OXYCODONE-ACETAMINOPHEN 5-325 MG PO TABS
ORAL_TABLET | ORAL | Status: AC
  Filled 2013-01-30: qty 1

## 2013-01-30 MED ORDER — ONDANSETRON 8 MG PO TBDP
ORAL_TABLET | ORAL | Status: AC
  Filled 2013-01-30: qty 1

## 2013-01-30 MED ORDER — ACYCLOVIR 800 MG PO TABS: 800.0000 mg | ORAL_TABLET | Freq: Every day | ORAL | Status: DC

## 2013-01-30 NOTE — ED Provider Notes (Signed)
Patient seen frequently for pain complaints. Today's rash on the left side of her neck in the preceding tingling and discomfort could be early shingles. We'll treat as such. Patient has a history of known narcotic abuse anxiety and chronic pain. Patient given Percocet here without any relief. Will treat as if it shingles do not recommend any narcotic pain meds.  Medical screening examination/treatment/procedure(s) were conducted as a shared visit with non-physician practitioner(s) and myself.  I personally evaluated the patient during the encounter.  EKG Interpretation   None        Shelda Jakes, MD 01/30/13 [phone]

## 2013-01-30 NOTE — ED Notes (Signed)
Pt c/o rash to neck and toothache.

## 2013-01-30 NOTE — ED Provider Notes (Signed)
CSN: [id number]     Arrival date & time 01/30/13  1455 History   First MD Initiated Contact with Patient 01/30/13 1523     Chief Complaint  Patient presents with  . Rash   (Consider location/radiation/quality/duration/timing/severity/associated sxs/prior Treatment) HPI [name] is a 32 y.o. female who presents to the ED with a rash that started today. The rash is located on the left side of her neck. She felt a burning sensation when she got up this morning and saw two blister like area and then a little while later noted another area on her left chest wall. Associated symptoms include nausea and vomiting that she thinks is due to the pain.   Past Medical History  Diagnosis Date  . Hypokalemia   . Narcotic abuse     5 yrs ago lortab, off few yrs  . Anxiety   . Chronic pain   . Endometriosis   . Renal disorder Cyst on Kidney   Past Surgical History  Procedure Laterality Date  . Tonsillectomy    . Cholecystectomy  2010  . Tubal ligation    . Carpal tunnel release      both  . Abdominal hysterectomy      left ovary remains   Family History  Problem Relation Age of Onset  . Stroke Father   . Diverticulitis Father   . Heart disease    . Arthritis    . Diabetes    . Kidney disease     History  Substance Use Topics  . Smoking status: Current Every Day Smoker -- 1.50 packs/day for 18 years    Types: Cigarettes  . Smokeless tobacco: Not on file  . Alcohol Use: Yes     Comment: rarely   OB History   Grav Para Term Preterm Abortions TAB SAB Ect Mult Living   4 4 4       4      Review of Systems  Constitutional: Negative for fever and chills.  Eyes: Negative for pain and itching.  Respiratory: Negative for cough and chest tightness.   Cardiovascular: Negative for chest pain.  Gastrointestinal: Positive for nausea and vomiting. Negative for abdominal pain.  Genitourinary: Negative for dysuria and frequency.  Musculoskeletal: Negative for back pain.  Skin: Positive  for rash.  Allergic/Immunologic: Negative for food allergies.  Neurological: Negative for dizziness and headaches.  Psychiatric/Behavioral: The patient is not nervous/anxious.     Allergies  Divalproex sodium; Fioricet-codeine; Ibuprofen; and Ketorolac tromethamine  Home Medications   Current Outpatient Rx  Name  Route  Sig  Dispense  Refill  . ALPRAZolam (XANAX) 1 MG tablet   Oral   Take 1 mg by mouth 3 (three) times daily as needed.         . celecoxib (CELEBREX) 100 MG capsule   Oral   Take 1 capsule (100 mg total) by mouth 2 (two) times daily.   20 capsule   0   . oxyCODONE-acetaminophen (ROXICET) 5-325 MG per tablet   Oral   Take 1 tablet by mouth every 4 (four) hours as needed for pain.   10 tablet   0   . traMADol (ULTRAM) 50 MG tablet   Oral   Take 1 tablet by mouth every 6 (six) hours as needed for pain.           BP 96/77  Pulse 82  Temp(Src) 98.4 F (36.9 C) (Oral)  Resp 20  Ht 5\' 1"  (1.549 m)  Wt 120  lb (54.432 kg)  BMI 22.69 kg/m2  SpO2 93% Physical Exam  Nursing note and vitals reviewed. Constitutional: She is oriented to person, place, and time. She appears well-developed and well-nourished. No distress.  HENT:  Head: Normocephalic and atraumatic.  Eyes: EOM are normal.  Neck: Neck supple.  Cardiovascular: Normal rate.   Pulmonary/Chest: Effort normal.    There are a few red raised vesicular areas noted on the neck and chest wall.   Abdominal: Soft. There is no tenderness.  Musculoskeletal: Normal range of motion.  Neurological: She is alert and oriented to person, place, and time. No cranial nerve deficit.  Skin: Skin is warm and dry.  Psychiatric: She has a normal mood and affect. Her behavior is normal.    ED Course: Dr. Deretha Emory in to examine the patient. Consider early shingles although is not typical at this time.   Procedures  EKG Interpretation   None      MDM  32 y.o. female with a rash to the left side of her neck  and chest wall. Will treat for shingles and she is to follow up with her PCP. She will return here if symptoms worsen. Dr. Deretha Emory explained to the patient plan of care.     Medication List    TAKE these medications       acyclovir 800 MG tablet  Commonly known as:  ZOVIRAX  Take 1 tablet (800 mg total) by mouth 5 (five) times daily.      ASK your doctor about these medications       LYRICA 25 MG capsule  Generic drug:  pregabalin  Take 25 mg by mouth 4 (four) times daily.           Janne Napoleon, [place] 01/30/13 [phone]

## 2013-02-20 ENCOUNTER — Emergency Department (HOSPITAL_COMMUNITY)
Admission: EM | Admit: 2013-02-20 | Discharge: 2013-02-20 | Discharge status: Against Medical Advice | Payer: BC Managed Care – PPO | Attending: Emergency Medicine | Admitting: Emergency Medicine

## 2013-02-20 ENCOUNTER — Encounter (HOSPITAL_COMMUNITY): Payer: Self-pay | Admitting: Emergency Medicine

## 2013-02-20 DIAGNOSIS — F17200 Nicotine dependence, unspecified, uncomplicated: Secondary | ICD-10-CM | POA: Insufficient documentation

## 2013-02-20 DIAGNOSIS — Z9851 Tubal ligation status: Secondary | ICD-10-CM | POA: Insufficient documentation

## 2013-02-20 DIAGNOSIS — G8929 Other chronic pain: Secondary | ICD-10-CM | POA: Insufficient documentation

## 2013-02-20 DIAGNOSIS — Z87448 Personal history of other diseases of urinary system: Secondary | ICD-10-CM | POA: Insufficient documentation

## 2013-02-20 DIAGNOSIS — K08109 Complete loss of teeth, unspecified cause, unspecified class: Secondary | ICD-10-CM | POA: Insufficient documentation

## 2013-02-20 DIAGNOSIS — Z3202 Encounter for pregnancy test, result negative: Secondary | ICD-10-CM | POA: Insufficient documentation

## 2013-02-20 DIAGNOSIS — Z8659 Personal history of other mental and behavioral disorders: Secondary | ICD-10-CM | POA: Insufficient documentation

## 2013-02-20 DIAGNOSIS — Z792 Long term (current) use of antibiotics: Secondary | ICD-10-CM | POA: Insufficient documentation

## 2013-02-20 DIAGNOSIS — Z8639 Personal history of other endocrine, nutritional and metabolic disease: Secondary | ICD-10-CM | POA: Insufficient documentation

## 2013-02-20 DIAGNOSIS — K920 Hematemesis: Secondary | ICD-10-CM | POA: Insufficient documentation

## 2013-02-20 DIAGNOSIS — R1110 Vomiting, unspecified: Secondary | ICD-10-CM

## 2013-02-20 DIAGNOSIS — K089 Disorder of teeth and supporting structures, unspecified: Secondary | ICD-10-CM | POA: Insufficient documentation

## 2013-02-20 DIAGNOSIS — Z862 Personal history of diseases of the blood and blood-forming organs and certain disorders involving the immune mechanism: Secondary | ICD-10-CM | POA: Insufficient documentation

## 2013-02-20 DIAGNOSIS — Z8742 Personal history of other diseases of the female genital tract: Secondary | ICD-10-CM | POA: Insufficient documentation

## 2013-02-20 DIAGNOSIS — R1084 Generalized abdominal pain: Secondary | ICD-10-CM | POA: Insufficient documentation

## 2013-02-20 DIAGNOSIS — Z90710 Acquired absence of both cervix and uterus: Secondary | ICD-10-CM | POA: Insufficient documentation

## 2013-02-20 DIAGNOSIS — Z79899 Other long term (current) drug therapy: Secondary | ICD-10-CM | POA: Insufficient documentation

## 2013-02-20 DIAGNOSIS — K0889 Other specified disorders of teeth and supporting structures: Secondary | ICD-10-CM

## 2013-02-20 DIAGNOSIS — Z9089 Acquired absence of other organs: Secondary | ICD-10-CM | POA: Insufficient documentation

## 2013-02-20 LAB — PROTIME-INR: INR: 1.03 (ref 0.00–1.49)

## 2013-02-20 LAB — COMPREHENSIVE METABOLIC PANEL
ALT: 15 U/L (ref 0–35)
AST: 16 U/L (ref 0–37)
Albumin: 3.7 g/dL (ref 3.5–5.2)
Alkaline Phosphatase: 121 U/L — ABNORMAL HIGH (ref 39–117)
BUN: 4 mg/dL — ABNORMAL LOW (ref 6–23)
CO2: 29 mEq/L (ref 19–32)
Calcium: 9.1 mg/dL (ref 8.4–10.5)
Chloride: 102 mEq/L (ref 96–112)
Creatinine, Ser: 0.61 mg/dL (ref 0.50–1.10)
GFR calc Af Amer: >90 mL/min (ref >90)
GFR calc non Af Amer: >90 mL/min (ref >90)
Glucose, Bld: 84 mg/dL (ref 70–99)
Potassium: 3.2 mEq/L — ABNORMAL LOW (ref 3.5–5.1)
Sodium: 140 mEq/L (ref 135–145)
Total Bilirubin: 0.2 mg/dL — ABNORMAL LOW (ref 0.3–1.2)

## 2013-02-20 LAB — CBC WITH DIFFERENTIAL/PLATELET
Basophils Absolute: 0.1 10*3/uL (ref 0.0–0.1)
Basophils Relative: 1 % (ref 0–1)
Eosinophils Relative: 2 % (ref 0–5)
HCT: 41.7 % (ref 36.0–46.0)
Lymphocytes Relative: 22 % (ref 12–46)
MCH: 35.5 pg — ABNORMAL HIGH (ref 26.0–34.0)
MCHC: 34.3 g/dL (ref 30.0–36.0)
MCV: 103.5 fL — ABNORMAL HIGH (ref 78.0–100.0)
Monocytes Absolute: 0.6 10*3/uL (ref 0.1–1.0)
Monocytes Relative: 6 % (ref 3–12)
Neutro Abs: 7.8 10*3/uL — ABNORMAL HIGH (ref 1.7–7.7)
Neutrophils Relative %: 70 % (ref 43–77)
Platelets: 402 10*3/uL — ABNORMAL HIGH (ref 150–400)
RDW: 12.1 % (ref 11.5–15.5)
WBC: 11.1 10*3/uL — ABNORMAL HIGH (ref 4.0–10.5)

## 2013-02-20 LAB — PREGNANCY, URINE: Preg Test, Ur: NEGATIVE

## 2013-02-20 LAB — URINALYSIS, ROUTINE W REFLEX MICROSCOPIC
Glucose, UA: NEGATIVE mg/dL
Ketones, ur: NEGATIVE mg/dL
Nitrite: NEGATIVE
Protein, ur: NEGATIVE mg/dL
Specific Gravity, Urine: 1.01 (ref 1.005–1.030)

## 2013-02-20 LAB — URINE MICROSCOPIC-ADD ON

## 2013-02-20 MED ORDER — HYDROMORPHONE HCL PF 1 MG/ML IJ SOLN
1.0000 mg | Freq: Once | INTRAMUSCULAR | Status: AC
  Administered 2013-02-20: 1 mg via INTRAVENOUS
  Filled 2013-02-20: qty 1

## 2013-02-20 MED ORDER — ONDANSETRON HCL 4 MG/2ML IJ SOLN
4.0000 mg | Freq: Once | INTRAMUSCULAR | Status: AC
  Administered 2013-02-20: 4 mg via INTRAVENOUS
  Filled 2013-02-20: qty 2

## 2013-02-20 MED ORDER — ONDANSETRON HCL 4 MG PO TABS: 4.0000 mg | ORAL_TABLET | Freq: Four times a day (QID) | ORAL | Status: DC

## 2013-02-20 MED ORDER — OXYCODONE-ACETAMINOPHEN 5-325 MG PO TABS: 2.0000 | ORAL_TABLET | ORAL | Status: DC | PRN

## 2013-02-20 MED ORDER — SODIUM CHLORIDE 0.9 % IV BOLUS (SEPSIS)
1000.0000 mL | Freq: Once | INTRAVENOUS | Status: AC
  Administered 2013-02-20: 1000 mL via INTRAVENOUS

## 2013-02-20 NOTE — ED Notes (Signed)
Per patient had teeth on upper right side pulled on Friday. Patient reports increased pain with facial swelling and "locking of jaw" that started this morning. Per patient using ice pack for swelling, taking vicodin 7.5mg , and antibiotics. Per patient now has abd pain and is now vomiting bright red blood.

## 2013-02-20 NOTE — ED Notes (Signed)
Pt c/o dental pain as well as N/V. Pt states she's had multiple teeth pulled this past week and the pain and swelling has worsened over last 24 hours. Pt reports blood in vomit this morning. Pt also reports generalized body aches and central abdominal pain.

## 2013-02-20 NOTE — ED Provider Notes (Signed)
CSN: [id number]     Arrival date & time 02/20/13  1140 History  This chart was scribed for [name], MD by Quintella Reichert, ED scribe.  This patient was seen in room APA07/AP[facility]07 and the patient's care was started at 1:12 PM.   Chief Complaint  Patient presents with  . Dental Pain  . Hematemesis  . Abdominal Pain    The history is provided by the patient. No language interpreter was used.    HPI Comments: [name] is a 31 y.o. female who presents to the Emergency Department complaining of several days of constant worsening dental pain with associated abdominal pain, nausea and vomiting.  Pt states she has had 4 teeth pulled over the past week (3 on 12/10 and 1 on 12/12).  The pain and swelling began to improve but since yesterday have been worsening again.  She has attempted to treat pain with Vicodin but has not been able to keep it down.  This morning around 5:30 AM she developed abdominal pain and nausea.  When she began vomiting she saw blood in her emesis.  She states that the blood was bright red and she noticed it each time she vomited although it became less prominent each time.  She has not noticed blood in her mouth.  She denies fever, diarrhea,  bowel dysfunction, blood in stools, or CP.  Pt denies chronic medical conditions or regular medication usage.   She admits to h/o cholecystectomy, tubal ligation, and abdominal hysterectomy.   She is not on blood-thinners.   Past Medical History  Diagnosis Date  . Hypokalemia   . Narcotic abuse     5 yrs ago lortab, off few yrs  . Anxiety   . Chronic pain   . Endometriosis   . 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 Tonsillectomy    . Cholecystectomy  2010  . Tubal ligation    . Carpal tunnel release      both  . Abdominal hysterectomy      left ovary remains    Family History  Problem Relation Age of Onset  . Stroke Father   . Diverticulitis Father   . Heart  disease    . Arthritis    . Diabetes    . Kidney disease      History  Substance Use Topics  . Smoking status: Current Every Day Smoker -- 1.50 packs/day for 18 years    Types: Cigarettes  . Smokeless tobacco: Never Used  . Alcohol Use: Yes     Comment: rarely    OB History   Grav Para Term Preterm Abortions TAB SAB Ect Mult Living   4 4 4       4       Review of Systems A complete 10 system review of systems was obtained and all systems are negative except as noted in the HPI and PMH.    Allergies  Divalproex sodium; Fioricet-codeine; Ibuprofen; and Ketorolac tromethamine  Home Medications   Current Outpatient Rx  Name  Route  Sig  Dispense  Refill  . diphenhydrAMINE (BENADRYL) 25 mg capsule   Oral   Take 25 mg by mouth every 6 (six) hours as needed for itching or allergies.         . Hydrocodone-Acetaminophen 7.5-300 MG TABS   Oral   Take 1 tablet by mouth every 6 (six) hours as needed.         Marland Kitchen  LYRICA 25 MG capsule   Oral   Take 25 mg by mouth 4 (four) times daily.         . penicillin v potassium (VEETID) 500 MG tablet   Oral   Take 500 mg by mouth 4 (four) times daily. For tooth infections.         . ondansetron (ZOFRAN) 4 MG tablet   Oral   Take 1 tablet (4 mg total) by mouth every 6 (six) hours.   12 tablet   0   . oxyCODONE-acetaminophen (PERCOCET/ROXICET) 5-325 MG per tablet   Oral   Take 2 tablets by mouth every 4 (four) hours as needed for severe pain.   15 tablet   0    BP 126/53  Pulse 103  Temp(Src) 99.2 F (37.3 C) (Oral)  Resp 18  Ht 5\' 1"  (1.549 m)  Wt 120 lb (54.432 kg)  BMI 22.69 kg/m2  SpO2 100%  Physical Exam  Nursing note and vitals reviewed. Constitutional: She is oriented to person, place, and time. She appears well-developed and well-nourished. No distress.  HENT:  Head: Normocephalic and atraumatic.  Multiple missing teeth.  On right upper jaw there are sockets of recent extraction, tender, clots intact.   Floor of mouth is soft.  No trismus.  Eyes: EOM are normal.  Neck: Neck supple. No tracheal deviation present.  Cardiovascular: Normal rate, regular rhythm and normal heart sounds.   No murmur heard. Pulmonary/Chest: Effort normal and breath sounds normal. No respiratory distress. She has no wheezes. She has no rales.  Abdominal: Soft. There is tenderness (diffusely).  Musculoskeletal: Normal range of motion.  Neurological: She is alert and oriented to person, place, and time.  Skin: Skin is warm and dry.  Psychiatric: She has a normal mood and affect. Her behavior is normal.    ED Course  Procedures (including critical care time)  DIAGNOSTIC STUDIES: Oxygen Saturation is 100% on room air, normal by my interpretation.    COORDINATION OF CARE: 1:19 PM-Discussed treatment plan which includes pain medication and anti-emetics with pt at bedside and pt agreed to plan.    Labs Review Labs Reviewed  URINALYSIS, ROUTINE W REFLEX MICROSCOPIC - Abnormal; Notable for the following:    Leukocytes, UA MODERATE (*)    All other components within normal limits  CBC WITH DIFFERENTIAL - Abnormal; Notable for the following:    WBC 11.1 (*)    MCV 103.5 (*)    MCH 35.5 (*)    Platelets 402 (*)    Neutro Abs 7.8 (*)    All other components within normal limits  COMPREHENSIVE METABOLIC PANEL - Abnormal; Notable for the following:    Potassium 3.2 (*)    BUN 4 (*)    Alkaline Phosphatase 121 (*)    Total Bilirubin 0.2 (*)    All other components within normal limits  URINE MICROSCOPIC-ADD ON - Abnormal; Notable for the following:    Bacteria, UA FEW (*)    All other components within normal limits  URINE CULTURE  PREGNANCY, URINE  PROTIME-INR    Imaging Review No results found.  EKG Interpretation   None       MDM   1. Pain, dental   2. Vomiting    Multiple teeth extracted last week with worsening pain. Develop nausea and vomiting since last night and saw some bright red  blood in her emesis. Abdomen is diffusely tender from vomiting.  Hemoglobin stable. No vomiting in the ED. NG tube  placement was attempted given patient's report of vomiting blood. This was unsuccessful and she refused further attempts.  As she vomited blood initially and then it cleared, it seems likely that this blood was from her recent dental procedure. She refuses further NG attempts. She understands that GI bleeding has not been ruled out but seem less likely given recent dental procedure. No vomiting in the ED, tolerating PO. She will leave AMA as she is refusing further NG attempts, understands that GI bleeding has not been ruled out and she may get worse.  She has capacity to make this decision.   BP 101/59  Pulse 83  Temp(Src) 99.2 F (37.3 C) (Oral)  Resp 18  Ht 5\' 1"  (1.549 m)  Wt 120 lb (54.432 kg)  BMI 22.69 kg/m2  SpO2 100%  I personally performed the services described in this documentation, which was scribed in my presence. The recorded information has been reviewed and is accurate.    [name], MD 02/20/13 [facility]

## 2013-02-21 LAB — URINE CULTURE
Colony Count: NO GROWTH
Culture: NO GROWTH

## 2013-03-12 ENCOUNTER — Encounter (HOSPITAL_COMMUNITY): Payer: Self-pay | Admitting: Emergency Medicine

## 2013-03-12 ENCOUNTER — Emergency Department (HOSPITAL_COMMUNITY)
Admission: EM | Admit: 2013-03-12 | Discharge: 2013-03-12 | Disposition: A | Discharge status: Home / Self Care | Payer: BC Managed Care – PPO | Attending: Emergency Medicine | Admitting: Emergency Medicine

## 2013-03-12 DIAGNOSIS — Z90710 Acquired absence of both cervix and uterus: Secondary | ICD-10-CM | POA: Insufficient documentation

## 2013-03-12 DIAGNOSIS — F17200 Nicotine dependence, unspecified, uncomplicated: Secondary | ICD-10-CM | POA: Insufficient documentation

## 2013-03-12 DIAGNOSIS — G8929 Other chronic pain: Secondary | ICD-10-CM | POA: Insufficient documentation

## 2013-03-12 DIAGNOSIS — Z8742 Personal history of other diseases of the female genital tract: Secondary | ICD-10-CM | POA: Insufficient documentation

## 2013-03-12 DIAGNOSIS — R197 Diarrhea, unspecified: Secondary | ICD-10-CM | POA: Insufficient documentation

## 2013-03-12 DIAGNOSIS — R109 Unspecified abdominal pain: Secondary | ICD-10-CM

## 2013-03-12 DIAGNOSIS — R112 Nausea with vomiting, unspecified: Secondary | ICD-10-CM | POA: Insufficient documentation

## 2013-03-12 DIAGNOSIS — Z79899 Other long term (current) drug therapy: Secondary | ICD-10-CM | POA: Insufficient documentation

## 2013-03-12 DIAGNOSIS — R1031 Right lower quadrant pain: Secondary | ICD-10-CM | POA: Insufficient documentation

## 2013-03-12 DIAGNOSIS — Z8659 Personal history of other mental and behavioral disorders: Secondary | ICD-10-CM | POA: Insufficient documentation

## 2013-03-12 DIAGNOSIS — R52 Pain, unspecified: Secondary | ICD-10-CM | POA: Insufficient documentation

## 2013-03-12 DIAGNOSIS — Z9089 Acquired absence of other organs: Secondary | ICD-10-CM | POA: Insufficient documentation

## 2013-03-12 DIAGNOSIS — Z9851 Tubal ligation status: Secondary | ICD-10-CM | POA: Insufficient documentation

## 2013-03-12 DIAGNOSIS — Z8639 Personal history of other endocrine, nutritional and metabolic disease: Secondary | ICD-10-CM | POA: Insufficient documentation

## 2013-03-12 DIAGNOSIS — Z862 Personal history of diseases of the blood and blood-forming organs and certain disorders involving the immune mechanism: Secondary | ICD-10-CM | POA: Insufficient documentation

## 2013-03-12 LAB — COMPREHENSIVE METABOLIC PANEL
ALT: 27 U/L (ref 0–35)
AST: 27 U/L (ref 0–37)
Albumin: 3.5 g/dL (ref 3.5–5.2)
Alkaline Phosphatase: 98 U/L (ref 39–117)
BILIRUBIN TOTAL: 0.4 mg/dL (ref 0.3–1.2)
BUN: 3 mg/dL — AB (ref 6–23)
CHLORIDE: 103 meq/L (ref 96–112)
CO2: 28 meq/L (ref 19–32)
CREATININE: 0.58 mg/dL (ref 0.50–1.10)
Calcium: 8.9 mg/dL (ref 8.4–10.5)
GLUCOSE: 105 mg/dL — AB (ref 70–99)
Potassium: 4.2 mEq/L (ref 3.7–5.3)
Sodium: 141 mEq/L (ref 137–147)
Total Protein: 6.6 g/dL (ref 6.0–8.3)

## 2013-03-12 LAB — URINALYSIS, ROUTINE W REFLEX MICROSCOPIC
Bilirubin Urine: NEGATIVE
GLUCOSE, UA: NEGATIVE mg/dL
Hgb urine dipstick: NEGATIVE
KETONES UR: NEGATIVE mg/dL
Nitrite: NEGATIVE
PH: 6 (ref 5.0–8.0)
Protein, ur: NEGATIVE mg/dL
Specific Gravity, Urine: 1.01 (ref 1.005–1.030)
Urobilinogen, UA: 0.2 mg/dL (ref 0.0–1.0)

## 2013-03-12 LAB — CBC WITH DIFFERENTIAL/PLATELET
Basophils Absolute: 0.1 10*3/uL (ref 0.0–0.1)
Basophils Relative: 1 % (ref 0–1)
Eosinophils Absolute: 0.3 10*3/uL (ref 0.0–0.7)
Eosinophils Relative: 3 % (ref 0–5)
HEMATOCRIT: 37.4 % (ref 36.0–46.0)
HEMOGLOBIN: 12.6 g/dL (ref 12.0–15.0)
LYMPHS ABS: 3.9 10*3/uL (ref 0.7–4.0)
Lymphocytes Relative: 38 % (ref 12–46)
MCH: 35 pg — ABNORMAL HIGH (ref 26.0–34.0)
MCHC: 33.7 g/dL (ref 30.0–36.0)
MCV: 103.9 fL — ABNORMAL HIGH (ref 78.0–100.0)
MONO ABS: 0.5 10*3/uL (ref 0.1–1.0)
MONOS PCT: 5 % (ref 3–12)
NEUTROS ABS: 5.4 10*3/uL (ref 1.7–7.7)
Neutrophils Relative %: 54 % (ref 43–77)
Platelets: 284 10*3/uL (ref 150–400)
RBC: 3.6 MIL/uL — ABNORMAL LOW (ref 3.87–5.11)
RDW: 12.1 % (ref 11.5–15.5)
WBC: 10.2 10*3/uL (ref 4.0–10.5)

## 2013-03-12 LAB — URINE MICROSCOPIC-ADD ON

## 2013-03-12 MED ORDER — SODIUM CHLORIDE 0.9 % IV BOLUS (SEPSIS)
500.0000 mL | Freq: Once | INTRAVENOUS | Status: AC
  Administered 2013-03-12: 500 mL via INTRAVENOUS

## 2013-03-12 MED ORDER — FENTANYL CITRATE 0.05 MG/ML IJ SOLN
100.0000 ug | Freq: Once | INTRAMUSCULAR | Status: AC
  Administered 2013-03-12: 100 ug via INTRAVENOUS
  Filled 2013-03-12: qty 2

## 2013-03-12 MED ORDER — PROMETHAZINE HCL 25 MG PO TABS: 25.0000 mg | ORAL_TABLET | Freq: Four times a day (QID) | ORAL | Status: DC | PRN

## 2013-03-12 MED ORDER — PROMETHAZINE HCL 25 MG/ML IJ SOLN
12.5000 mg | Freq: Once | INTRAMUSCULAR | Status: AC
  Administered 2013-03-12: 12.5 mg via INTRAVENOUS
  Filled 2013-03-12: qty 1

## 2013-03-12 MED ORDER — ONDANSETRON HCL 4 MG/2ML IJ SOLN
4.0000 mg | Freq: Once | INTRAMUSCULAR | Status: AC
  Administered 2013-03-12: 4 mg via INTRAVENOUS
  Filled 2013-03-12: qty 2

## 2013-03-12 NOTE — ED Provider Notes (Signed)
CSN: [id number]     Arrival date & time 03/12/13  1554 History   First MD Initiated Contact with Patient 03/12/13 1753     Chief Complaint  Patient presents with  . Abdominal Pain   (Consider location/radiation/quality/duration/timing/severity/associated sxs/prior Treatment) Patient is a 33 y.o. female presenting with abdominal pain. The history is provided by the patient.  Abdominal Pain Pain location:  RLQ Associated symptoms: diarrhea, nausea and vomiting   Associated symptoms: no chest pain, no chills, no fever and no shortness of breath    patient with right lower abdominal pain for the last day or 2. States it is constant and crampy. No fevers. She states she's had nausea vomiting some diarrhea. She has a history of chronic abdominal pain but states this feels different. She states she's been on Vicodin for her teeth and states it does not help with the pain. No dysuria. She has had a decreased appetite and states that eating makes the pain worse. No hematemesis. No blood in stool. She's had a previous hysterectomy  Past Medical History  Diagnosis Date  . Hypokalemia   . Narcotic abuse     5 yrs ago lortab, off few yrs  . Anxiety   . Chronic pain   . Endometriosis   . Renal disorder Cyst on Kidney   Past Surgical History  Procedure Laterality Date  . Tonsillectomy    . Cholecystectomy  2010  . Tubal ligation    . Carpal tunnel release      both  . Abdominal hysterectomy      left ovary remains   Family History  Problem Relation Age of Onset  . Stroke Father   . Diverticulitis Father   . Heart disease    . Arthritis    . Diabetes    . Kidney disease     History  Substance Use Topics  . Smoking status: Current Every Day Smoker -- 1.50 packs/day for 18 years    Types: Cigarettes  . Smokeless tobacco: Never Used  . Alcohol Use: Yes     Comment: rarely   OB History   Grav Para Term Preterm Abortions TAB SAB Ect Mult Living   4 4 4       4      Review of Systems   Constitutional: Negative for fever, chills, activity change and appetite change.  Eyes: Negative for pain.  Respiratory: Negative for chest tightness and shortness of breath.   Cardiovascular: Negative for chest pain and leg swelling.  Gastrointestinal: Positive for nausea, vomiting, abdominal pain and diarrhea.  Genitourinary: Negative for flank pain.  Musculoskeletal: Negative for back pain and neck stiffness.  Skin: Negative for rash.  Neurological: Negative for weakness, numbness and headaches.  Psychiatric/Behavioral: Negative for behavioral problems.    Allergies  Divalproex sodium; Fioricet-codeine; Ibuprofen; and Ketorolac tromethamine  Home Medications   Current Outpatient Rx  Name  Route  Sig  Dispense  Refill  . diphenhydrAMINE (BENADRYL) 25 mg capsule   Oral   Take 25 mg by mouth every 6 (six) hours as needed for itching or allergies.         Marland Kitchen oxyCODONE-acetaminophen (PERCOCET) 7.5-325 MG per tablet   Oral   Take 1 tablet by mouth every 4 (four) hours as needed for pain.         Marland Kitchen LYRICA 25 MG capsule   Oral   Take 25 mg by mouth 4 (four) times daily.         Marland Kitchen  promethazine (PHENERGAN) 25 MG tablet   Oral   Take 1 tablet (25 mg total) by mouth every 6 (six) hours as needed for nausea.   10 tablet   0    BP 93/52  Pulse 64  Temp(Src) 98.5 F (36.9 C) (Oral)  Resp 16  Ht 5\' 1"  (1.549 m)  Wt 120 lb (54.432 kg)  BMI 22.69 kg/m2  SpO2 100% Physical Exam  Nursing note and vitals reviewed. Constitutional: She is oriented to person, place, and time. She appears well-developed and well-nourished.  HENT:  Head: Normocephalic and atraumatic.  Eyes: EOM are normal. Pupils are equal, round, and reactive to light.  Neck: Normal range of motion. Neck supple.  Cardiovascular: Normal rate, regular rhythm and normal heart sounds.   No murmur heard. Pulmonary/Chest: Effort normal and breath sounds normal. No respiratory distress. She has no wheezes. She has  no rales.  Abdominal: Soft. Bowel sounds are normal. She exhibits no distension. There is tenderness. There is no rebound and no guarding.  Right lower quadrant tenderness without rebound or guarding.  Musculoskeletal: Normal range of motion.  Neurological: She is alert and oriented to person, place, and time. No cranial nerve deficit.  Skin: Skin is warm and dry.  Psychiatric: She has a normal mood and affect. Her speech is normal.    ED Course  Procedures (including critical care time) Labs Review Labs Reviewed  URINALYSIS, ROUTINE W REFLEX MICROSCOPIC - Abnormal; Notable for the following:    Leukocytes, UA SMALL (*)    All other components within normal limits  CBC WITH DIFFERENTIAL - Abnormal; Notable for the following:    RBC 3.60 (*)    MCV 103.9 (*)    MCH 35.0 (*)    All other components within normal limits  COMPREHENSIVE METABOLIC PANEL - Abnormal; Notable for the following:    Glucose, Bld 105 (*)    BUN 3 (*)    All other components within normal limits  URINE MICROSCOPIC-ADD ON   Imaging Review No results found.  EKG Interpretation   None       MDM   1. Abdominal pain    Patient acute on chronic abdominal pain. Has had multiple pain-related visits to the ED. She's been to the ED 13 times in the last 6 months. That work is reassuring. White count is normal. She has had 5 previous abdomen pelvis CTs. At this time I do not feel she would benefit from another one. She still has her appendix but she is minimally tender in the right lower quadrant. She's had numerous pain prescriptions from multiple providers in the last 6 months. She has had 24 prescriptions from 12 providers in the last 6 months. She told me that she still had her hydrocodone and had not run out of yet. She later told the nurses she had run out and needed something stronger. At this time I do not feel she would benefit from further narcotics for home from the ED. She was discharged  home.     Juliet RudeNathan R. Rubin PayorPickering, MD 03/12/13 [phone]

## 2013-03-12 NOTE — ED Notes (Signed)
Pt c/o rlq abd pain with n/v/d x 3 days.

## 2013-03-12 NOTE — Discharge Instructions (Signed)

## 2013-09-10 ENCOUNTER — Emergency Department (HOSPITAL_COMMUNITY)
Admission: EM | Admit: 2013-09-10 | Discharge: 2013-09-10 | Disposition: A | Discharge status: Home / Self Care | Payer: BC Managed Care – PPO | Attending: Emergency Medicine | Admitting: Emergency Medicine

## 2013-09-10 ENCOUNTER — Emergency Department (HOSPITAL_COMMUNITY): Payer: BC Managed Care – PPO

## 2013-09-10 ENCOUNTER — Encounter (HOSPITAL_COMMUNITY): Payer: Self-pay | Admitting: Emergency Medicine

## 2013-09-10 DIAGNOSIS — N83202 Unspecified ovarian cyst, left side: Secondary | ICD-10-CM

## 2013-09-10 DIAGNOSIS — Z862 Personal history of diseases of the blood and blood-forming organs and certain disorders involving the immune mechanism: Secondary | ICD-10-CM | POA: Insufficient documentation

## 2013-09-10 DIAGNOSIS — G8929 Other chronic pain: Secondary | ICD-10-CM | POA: Insufficient documentation

## 2013-09-10 DIAGNOSIS — Z8659 Personal history of other mental and behavioral disorders: Secondary | ICD-10-CM | POA: Insufficient documentation

## 2013-09-10 DIAGNOSIS — N83209 Unspecified ovarian cyst, unspecified side: Secondary | ICD-10-CM | POA: Insufficient documentation

## 2013-09-10 DIAGNOSIS — Z87891 Personal history of nicotine dependence: Secondary | ICD-10-CM | POA: Insufficient documentation

## 2013-09-10 DIAGNOSIS — Z8542 Personal history of malignant neoplasm of other parts of uterus: Secondary | ICD-10-CM | POA: Insufficient documentation

## 2013-09-10 DIAGNOSIS — R102 Pelvic and perineal pain: Secondary | ICD-10-CM

## 2013-09-10 HISTORY — DX: Headache, unspecified: R51.9

## 2013-09-10 HISTORY — DX: Pelvic and perineal pain: R10.2

## 2013-09-10 HISTORY — DX: Other chronic pain: G89.29

## 2013-09-10 HISTORY — DX: Headache: R51

## 2013-09-10 HISTORY — DX: Dorsalgia, unspecified: M54.9

## 2013-09-10 LAB — URINE MICROSCOPIC-ADD ON

## 2013-09-10 LAB — CBC WITH DIFFERENTIAL/PLATELET
BASOS ABS: 0.1 10*3/uL (ref 0.0–0.1)
Basophils Relative: 1 % (ref 0–1)
EOS ABS: 0.2 10*3/uL (ref 0.0–0.7)
Eosinophils Relative: 2 % (ref 0–5)
HCT: 44.2 % (ref 36.0–46.0)
Hemoglobin: 14.9 g/dL (ref 12.0–15.0)
Lymphocytes Relative: 34 % (ref 12–46)
Lymphs Abs: 3 10*3/uL (ref 0.7–4.0)
MCH: 33.9 pg (ref 26.0–34.0)
MCHC: 33.7 g/dL (ref 30.0–36.0)
MCV: 100.5 fL — ABNORMAL HIGH (ref 78.0–100.0)
Monocytes Absolute: 0.5 10*3/uL (ref 0.1–1.0)
Monocytes Relative: 5 % (ref 3–12)
NEUTROS ABS: 5.3 10*3/uL (ref 1.7–7.7)
NEUTROS PCT: 58 % (ref 43–77)
Platelets: 336 10*3/uL (ref 150–400)
RBC: 4.4 MIL/uL (ref 3.87–5.11)
RDW: 12.4 % (ref 11.5–15.5)
WBC: 9 10*3/uL (ref 4.0–10.5)

## 2013-09-10 LAB — COMPREHENSIVE METABOLIC PANEL
ALK PHOS: 123 U/L — AB (ref 39–117)
ALT: 25 U/L (ref 0–35)
ANION GAP: 11 (ref 5–15)
AST: 22 U/L (ref 0–37)
Albumin: 4.1 g/dL (ref 3.5–5.2)
BILIRUBIN TOTAL: 0.4 mg/dL (ref 0.3–1.2)
BUN: 6 mg/dL (ref 6–23)
CHLORIDE: 102 meq/L (ref 96–112)
CO2: 29 mEq/L (ref 19–32)
CREATININE: 0.61 mg/dL (ref 0.50–1.10)
Calcium: 9.7 mg/dL (ref 8.4–10.5)
GFR calc Af Amer: >90 mL/min (ref >90)
GFR calc non Af Amer: >90 mL/min (ref >90)
Glucose, Bld: 89 mg/dL (ref 70–99)
POTASSIUM: 4.8 meq/L (ref 3.7–5.3)
Sodium: 142 mEq/L (ref 137–147)
Total Protein: 7.5 g/dL (ref 6.0–8.3)

## 2013-09-10 LAB — URINALYSIS, ROUTINE W REFLEX MICROSCOPIC
Bilirubin Urine: NEGATIVE
Glucose, UA: NEGATIVE mg/dL
Hgb urine dipstick: NEGATIVE
KETONES UR: NEGATIVE mg/dL
NITRITE: NEGATIVE
PH: 5.5 (ref 5.0–8.0)
Protein, ur: NEGATIVE mg/dL
Specific Gravity, Urine: 1.01 (ref 1.005–1.030)
Urobilinogen, UA: 0.2 mg/dL (ref 0.0–1.0)

## 2013-09-10 LAB — LIPASE, BLOOD: Lipase: 22 U/L (ref 11–59)

## 2013-09-10 MED ORDER — OXYCODONE-ACETAMINOPHEN 5-325 MG PO TABS
2.0000 | ORAL_TABLET | Freq: Once | ORAL | Status: AC
  Administered 2013-09-10: 2 via ORAL
  Filled 2013-09-10: qty 2

## 2013-09-10 MED ORDER — PROMETHAZINE HCL 25 MG PO TABS: 25.0000 mg | ORAL_TABLET | Freq: Four times a day (QID) | ORAL | Status: DC | PRN

## 2013-09-10 MED ORDER — FENTANYL CITRATE 0.05 MG/ML IJ SOLN
100.0000 ug | Freq: Once | INTRAMUSCULAR | Status: AC
  Administered 2013-09-10: 100 ug via INTRAMUSCULAR
  Filled 2013-09-10: qty 2

## 2013-09-10 MED ORDER — PROMETHAZINE HCL 25 MG/ML IJ SOLN
25.0000 mg | Freq: Once | INTRAMUSCULAR | Status: AC
  Administered 2013-09-10: 25 mg via INTRAMUSCULAR
  Filled 2013-09-10: qty 1

## 2013-09-10 NOTE — ED Notes (Signed)
Pt states abdominal pain and back pain began last night. States vomiting yesterday.

## 2013-09-10 NOTE — ED Provider Notes (Signed)
CSN: [id number]     Arrival date & time 09/10/13  1257 History   First MD Initiated Contact with Patient 09/10/13 1417     Chief Complaint  Patient presents with  . Abdominal Pain      HPI Pt was seen at 1425.  Per pt, c/o gradual onset and persistence of constant acute flair of her chronic left sided abd "pain" for the past 3 days.  Has been associated with left sided low back pain, as well as multiple intermittent episodes of N/V.  Describes the abd pain as "sharp" and "worse than my usual pain."  Denies diarrhea, no fevers, no vaginal bleeding/discharge, no rash, no CP/SOB, no black or blood in stools or emesis.      OB/GYN: Emelda Fear Past Medical History  Diagnosis Date  . Hypokalemia   . Narcotic abuse     5 yrs ago lortab, off few yrs  . Anxiety   . Chronic pain   . Endometriosis   . 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 Chronic headache   . Chronic back pain   . Chronic pelvic pain in female    Past Surgical History  Procedure Laterality Date  . Tonsillectomy    . Cholecystectomy  2010  . Tubal ligation    . Carpal tunnel release      both  . Abdominal hysterectomy      left ovary remains   Family History  Problem Relation Age of Onset  . Stroke Father   . Diverticulitis Father   . Heart disease    . Arthritis    . Diabetes    . Kidney disease     History  Substance Use Topics  . Smoking status: Current Every Day Smoker -- 1.50 packs/day for 18 years    Types: Cigarettes  . Smokeless tobacco: Never Used  . Alcohol Use: Yes     Comment: rarely   OB History   Grav Para Term Preterm Abortions TAB SAB Ect Mult Living   4 4 4       4      Review of Systems ROS: Statement: All systems negative except as marked or noted in the HPI; Constitutional: Negative for fever and chills. ; ; Eyes: Negative for eye pain, redness and discharge. ; ; ENMT: Negative for ear pain, hoarseness, nasal congestion, sinus pressure and sore throat. ; ; Cardiovascular: Negative for chest  pain, palpitations, diaphoresis, dyspnea and peripheral edema. ; ; Respiratory: Negative for cough, wheezing and stridor. ; ; Gastrointestinal: +abd pain, N/V. Negative for diarrhea, blood in stool, hematemesis, jaundice and rectal bleeding. . ; ; Genitourinary: Negative for dysuria, flank pain and hematuria. ; ; GYN:  No vaginal bleeding, no vaginal discharge, no vulvar pain. ;; Musculoskeletal: +LBP. Negative for neck pain. Negative for swelling and trauma.; ; Skin: Negative for pruritus, rash, abrasions, blisters, bruising and skin lesion.; ; Neuro: Negative for headache, lightheadedness and neck stiffness. Negative for weakness, altered level of consciousness , altered mental status, extremity weakness, paresthesias, involuntary movement, seizure and syncope.      Allergies  Divalproex sodium; Fioricet-codeine; Ibuprofen; and Ketorolac tromethamine  Home Medications   Prior to Admission medications   Medication Sig Start Date End Date Taking? Authorizing Provider  meloxicam (MOBIC) 15 MG tablet Take 15 mg by mouth daily as needed for pain.   Yes Historical Provider, MD   BP 105/52  Pulse 98  Temp(Src) 97.7 F (36.5 C) (Oral)  Resp 16  Ht 5\' 1"  (  1.549 m)  Wt 120 lb (54.432 kg)  BMI 22.69 kg/m2  SpO2 98% Physical Exam 1430: Physical examination:  Nursing notes reviewed; Vital signs and O2 SAT reviewed;  Constitutional: Well developed, Well nourished, Well hydrated, In no acute distress; Head:  Normocephalic, atraumatic; Eyes: EOMI, PERRL, No scleral icterus; ENMT: Mouth and pharynx normal, Mucous membranes moist; Neck: Supple, Full range of motion, No lymphadenopathy; Cardiovascular: Regular rate and rhythm, No murmur, rub, or gallop; Respiratory: Breath sounds clear & equal bilaterally, No rales, rhonchi, wheezes.  Speaking full sentences with ease, Normal respiratory effort/excursion; Chest: Nontender, Movement normal; Abdomen: Soft, +LUQ and LLQ tender to palp. No rebound or guarding.  Nondistended, Normal bowel sounds; Genitourinary: No CVA tenderness; Spine:  No midline CS, TS, LS tenderness. +TTP left lumbar paraspinal muscles.;; Extremities: Pulses normal, No tenderness, No edema, No calf edema or asymmetry.; Neuro: AA&Ox3, Major CN grossly intact.  Speech clear. No gross focal motor or sensory deficits in extremities. Climbs on and off stretcher easily by herself. Gait steady.; Skin: Color normal, Warm, Dry.   ED Course  Procedures    MDM  MDM Reviewed: previous chart, nursing note and vitals Reviewed previous: labs, CT scan and ultrasound Interpretation: labs, CT scan and ultrasound     Results for orders placed during the hospital encounter of 09/10/13  CBC WITH DIFFERENTIAL      Result Value Ref Range   WBC 9.0  4.0 - 10.5 K/uL   RBC 4.40  3.87 - 5.11 MIL/uL   Hemoglobin 14.9  12.0 - 15.0 g/dL   HCT 16.144.2  09.636.0 - 04.546.0 %   MCV 100.5 (*) 78.0 - 100.0 fL   MCH 33.9  26.0 - 34.0 pg   MCHC 33.7  30.0 - 36.0 g/dL   RDW 40.912.4  81.111.5 - 91.415.5 %   Platelets 336  150 - 400 K/uL   Neutrophils Relative % 58  43 - 77 %   Neutro Abs 5.3  1.7 - 7.7 K/uL   Lymphocytes Relative 34  12 - 46 %   Lymphs Abs 3.0  0.7 - 4.0 K/uL   Monocytes Relative 5  3 - 12 %   Monocytes Absolute 0.5  0.1 - 1.0 K/uL   Eosinophils Relative 2  0 - 5 %   Eosinophils Absolute 0.2  0.0 - 0.7 K/uL   Basophils Relative 1  0 - 1 %   Basophils Absolute 0.1  0.0 - 0.1 K/uL  COMPREHENSIVE METABOLIC PANEL      Result Value Ref Range   Sodium 142  137 - 147 mEq/L   Potassium 4.8  3.7 - 5.3 mEq/L   Chloride 102  96 - 112 mEq/L   CO2 29  19 - 32 mEq/L   Glucose, Bld 89  70 - 99 mg/dL   BUN 6  6 - 23 mg/dL   Creatinine, Ser 7.820.61  0.50 - 1.10 mg/dL   Calcium 9.7  8.4 - 95.610.5 mg/dL   Total Protein 7.5  6.0 - 8.3 g/dL   Albumin 4.1  3.5 - 5.2 g/dL   AST 22  0 - 37 U/L   ALT 25  0 - 35 U/L   Alkaline Phosphatase 123 (*) 39 - 117 U/L   Total Bilirubin 0.4  0.3 - 1.2 mg/dL   GFR calc non Af Amer >90   >90 mL/min   GFR calc Af Amer >90  >90 mL/min   Anion gap 11  5 - 15  URINALYSIS, ROUTINE  W REFLEX MICROSCOPIC      Result Value Ref Range   Color, Urine YELLOW  YELLOW   APPearance CLEAR  CLEAR   Specific Gravity, Urine 1.010  1.005 - 1.030   pH 5.5  5.0 - 8.0   Glucose, UA NEGATIVE  NEGATIVE mg/dL   Hgb urine dipstick NEGATIVE  NEGATIVE   Bilirubin Urine NEGATIVE  NEGATIVE   Ketones, ur NEGATIVE  NEGATIVE mg/dL   Protein, ur NEGATIVE  NEGATIVE mg/dL   Urobilinogen, UA 0.2  0.0 - 1.0 mg/dL   Nitrite NEGATIVE  NEGATIVE   Leukocytes, UA TRACE (*) NEGATIVE  LIPASE, BLOOD      Result Value Ref Range   Lipase 22  11 - 59 U/L  URINE MICROSCOPIC-ADD ON      Result Value Ref Range   Squamous Epithelial / LPF FEW (*) RARE   WBC, UA 3-6  <3 WBC/hpf   RBC / HPF 0-2  <3 RBC/hpf   Bacteria, UA RARE  RARE   Ct Abdomen Pelvis Wo Contrast 09/10/2013   CLINICAL DATA:  Several day history of left flank pain  EXAM: CT ABDOMEN AND PELVIS WITHOUT CONTRAST  TECHNIQUE: Multidetector CT imaging of the abdomen and pelvis was performed following the standard protocol without IV contrast.  COMPARISON:  CT abdomen pelvis of August 19, 2012  FINDINGS: The kidneys are normal in density and contour. There is no hydronephrosis nor calcified stones. The perinephric fat is normal. Along the course of the ureters no stones are demonstrated. The urinary bladder is normal.  The gallbladder is surgically absent. The liver, spleen, pancreas, adrenal glands, nondistended stomach, and periaortic and pericaval regions are normal. The small and large bowel exhibit no acute abnormality.  The lung bases are clear. The lumbar spine and bony pelvis are normal. Surgical clips in the right adnexal region presumably from prior right oophorectomy. There is mild prominence of the left ovary measuring 3.1 x 2.3 cm. There is an adjacent metallic clip compatible with previous tubal ligation. The uterus is surgically absent.  IMPRESSION: 1.  There is no urinary tract obstruction nor calcified stones. 2. There is mild soft tissue fullness in the left adnexal region which may reflect an ovarian cyst. Pelvic ultrasound would be useful if the patient's symptoms are referable to the left pelvic region. 3. There is no acute hepatobiliary nor acute bowel abnormality.   Electronically Signed   By: David  [place]   On: 09/10/2013 15:24    [place] Art/ven Flow Abd Pelv Doppler 09/10/2013   CLINICAL DATA:  Left lower quadrant pain  EXAM: TRANSABDOMINAL AND TRANSVAGINAL ULTRASOUND OF PELVIS  DOPPLER ULTRASOUND OF OVARIES  TECHNIQUE: Both transabdominal and transvaginal ultrasound examinations of the pelvis were performed. Transabdominal technique was performed for global imaging of the pelvis including uterus, ovaries, adnexal regions, and pelvic cul-de-sac.  It was necessary to proceed with endovaginal exam following the transabdominal exam to visualize the left ovary. Color and duplex Doppler ultrasound was utilized to evaluate blood flow to the left ovary.  COMPARISON:  08/19/2012  FINDINGS: Uterus  Surgically removed  Right ovary  Surgically removed  Left ovary  Measurements: 3.7 x 2.6 x 2.6 cm. Follicular changes are noted. Additionally there is a dominant 1.7 x 1.6 x 1.9 cm partially septated cystic structure.  Pulsed Doppler evaluation of the left ovary demonstrates normal low-resistance arterial and venous waveforms. No torsion is identified  Other findings  No free fluid.  IMPRESSION: Mildly complicated left ovarian cyst. This is  new from the prior exam.  No other focal abnormality is noted.   Electronically Signed   By: Alcide Clever M.D.   On: 09/10/2013 17:05    1720:  Pt continually requesting "more pain medication" and refusing any further dosing of fentanyl "because it's not working." Concerned regarding this.  Controlled Substance Database accessed: pt has been receiving regular monthly prescriptions of suboxone by Dr. Gerilyn Pilgrim for the past 7  months, with the last rx filled on 08/22/13.  Pt informed of this. Pt then stated she was "ready to go home now." States she "has enough mobic at home." Long hx of chronic pain with multiple ED visits for same. Pt encouraged to f/u with her OB/GYN MD and Pain Management doctor for good continuity of care and control of her chronic pain.  Verb understanding. Dx and testing d/w pt.  Questions answered.  Verb understanding, agreeable to d/c home with outpt f/u.   Laray Anger, DO 09/13/13 1121

## 2013-09-10 NOTE — Discharge Instructions (Signed)
°Emergency Department Resource Guide °1) Find a Doctor and Pay Out of Pocket °Although you won't have to find out who is covered by your insurance plan, it is a good idea to ask around and get recommendations. You will then need to call the office and see if the doctor you have chosen will accept you as a new patient and what types of options they offer for patients who are self-pay. Some doctors offer discounts or will set up payment plans for their patients who do not have insurance, but you will need to ask so you aren't surprised when you get to your appointment. ° °2) Contact Your Local Health Department °Not all health departments have doctors that can see patients for sick visits, but many do, so it is worth a call to see if yours does. If you don't know where your local health department is, you can check in your phone book. The CDC also has a tool to help you locate your state's health department, and many state websites also have listings of all of their local health departments. ° °3) Find a Walk-in Clinic °If your illness is not likely to be very severe or complicated, you may want to try a walk in clinic. These are popping up all over the country in pharmacies, drugstores, and shopping centers. They're usually staffed by nurse practitioners or physician assistants that have been trained to treat common illnesses and complaints. They're usually fairly quick and inexpensive. However, if you have serious medical issues or chronic medical problems, these are probably not your best option. ° °No Primary Care Doctor: °- Call Health Connect at  832-8000 - they can help you locate a primary care doctor that  accepts your insurance, provides certain services, etc. °- Physician Referral Service- 1-800-533-3463 ° °Chronic Pain Problems: °Organization         Address  Phone   Notes  °[facility]  (336) 297-2271 Patients need to be referred by their primary care doctor.  ° °Medication  Assistance: °Organization         Address  Phone   Notes  °[place] [facility] [address] °[place], [place] [address] (336) 641-8030 --Must be a resident of [place] °-- Must have NO insurance coverage whatsoever (no Medicaid/ Medicare, etc.) °-- The pt. MUST have a primary care doctor that directs their care regularly and follows them in the community °  °[facility]  (866) 331-1348   °[employer]  (888) 892-1162   ° °Agencies that provide inexpensive medical care: °Organization         Address  Phone   Notes  °[facility]  (336) 832-8035   °[facility]    (336) 832-7272   °[facility] [address] °[place], [place] [address] (336) 832-4777   °[facility] [address], °[place] (336) 271-4999   °[facility]    (336) 373-0678   °[facility]    (336) 272-1050   °[facility] ° [address], [place] Phone:  (336) 832-4444, Fax:  (336) 832-4440 Hours of Operation:  9 am - 6 pm, M-F.  Also accepts Medicaid/Medicare and self-pay.  °[facility] ° [address], [place] Phone: (336) 832-3150, Fax: (336) [phone]. Hours of Operation:  8:30 am - 5:30 pm, M-F.  Also accepts Medicaid and self-pay.  °[facility] [place] [address], [place] Phone: (336) 878-6027   °[facility] [address], [place], [place] (336)723-1848, Ext. 123 Mondays & Thursdays: 7-9 AM.  First 15 patients are seen on a first come, first serve basis. °  ° °Medicaid-accepting [place] Providers: ° °Organization         Address  Phone   Notes  °[facility] [address], [place] (336) 641-2100 Also accepts self-pay patients.  °[facility] [address], [place] ° (336) 856-9996   °[facility] [address], [place]  (336) 288-8857   °[facility] [address] [place] [address], [place] (336) 299-7000   °Veita Bland [address], [place]  ° (336) 373-1557 Only accepts [place] Access Medicaid patients after they have their name applied to their card.  ° °Self-Pay (no insurance) in [place]: ° °Organization         Address  Phone   Notes  °Sickle Cell Patients, [facility] [address], [place] (336) 832-1970   °[facility] [address], [place] (336) 832-4400   °[facility] ° [address], [place] (336) 992-4800   °[facility]/Dr. Osei-Bonsu ° [address] [place] [address], [place] or [address], [place] (336) 841-8500 Phone number for both [place] and [place] locations is the same.  °[facility] [address], [place] (336) 299-0000   °[facility] [address] [place] [address], [place] or [address] (336) 852-7530 °(336) 878-2260   °[facility] [address], [place] (336) 350-1642, phone; (336) 294-5005, fax Sees patients 1st and 3rd Saturday of every month.  Must not qualify for public or private insurance (i.e. Medicaid, Medicare, [place] Health Choice, Veterans' Benefits) • Household income should be no more than 200% of the poverty level •The clinic cannot treat you if you are pregnant or think you are pregnant • Sexually transmitted diseases are not treated at the clinic.  ° ° °Dental Care: °Organization         Address  Phone  Notes  °[place] [facility] [address], [place] (336) 641-6152 Accepts children up to age 21 who are enrolled in Medicaid or [place] Health Choice; pregnant women with a Medicaid card; and children who have applied for Medicaid or [place] Health Choice, but were declined, whose parents can pay a reduced fee at time of service.  °[place]  [facility] [place]  [address], [place] (336) 641-7733 Accepts children up to age 21 who are enrolled in Medicaid or [place] Health Choice; pregnant women with a Medicaid card; and children who have applied for Medicaid or [place] Health Choice, but were declined, whose parents can pay a reduced fee at time of service.  °[facility] ° [address], [place] (336) 641-4533 Patients are seen by appointment only. Walk-ins are not accepted. [facility] will see patients 18 years of age and older. °Monday - Tuesday (8am-5pm) °Most Wednesdays (8:30-5pm) °$30 per visit, cash only  °[facility] ° [address], [place] (336) 641-4533 Patients are seen by appointment only. Walk-ins are not accepted. [facility] will see patients 18 years of age and older. °One   Wednesday Evening (Monthly: Volunteer Based).  $30 per visit, cash only  °[facility]  (919) 537-3737 for adults; Children under age 4, call Graduate Pediatric Dentistry at (919) [phone]. Children aged 4-14, please call (919) [phone] to request a pediatric application. ° Dental services are provided in all areas of dental care including fillings, crowns and bridges, complete and partial dentures, implants, gum treatment, root canals, and extractions. Preventive care is also provided. Treatment is provided to both adults and children. °Patients are selected via a lottery and there is often a waiting list. °  °[facility] [address], °[place] ° (336) 763-8833 [email or web address] °  °[facility] [address], [place], [place] (336)723-1848, Ext. 123 Second and Fourth Thursday of each month, opens at 6:30 AM; Clinic ends at 9 AM.  Patients are seen on a first-come first-served basis, and a limited number are seen during each clinic.  ° °[facility] ° [address], [place], [place] (336) 723-7904    Eligibility Requirements °You must have lived in [place], [place], or [place] counties for at least the last three months. °  You cannot be eligible for state or federal sponsored healthcare insurance, including Veterans Administration, Medicaid, or Medicare. °  You generally cannot be eligible for healthcare insurance through your employer.  °  How to apply: °Eligibility screenings are held every Tuesday and Wednesday afternoon from 1:00 pm until 4:00 pm. You do not need an appointment for the interview!  °[facility] [address], [place], [place] 336-631-2330   °[facility]  336-342-8273   °[facility]  336-703-3100   °[facility]  336-570-6415   ° °Behavioral Health Resources in the Community: °Intensive Outpatient Programs °Organization         Address  Phone  Notes  °[facility] [address], [place], [place] 336-878-6098   °[facility] [address], [place], [place] 336-832-9800   °[facility] [address], [place], [place] ° 336-882-2125   °[facility] [address],  °[place], [place] 1-800-853-5163 or 336-641-4981   °Substance Abuse Resources °Organization         Address  Phone  Notes  °[facility]  336-882-2125   °[facility]  336-784-9470   °[facility]  336-285-9073   °[facility]  336-845-3988   °Residential & Outpatient Substance Abuse Program  1-800-659-3381   °Psychological Services °Organization         Address  Phone  Notes  °[facility]  336- 832-9600   °[facility]  336- 378-7881   °[facility] [address], [place] 1-800-853-5163 or 336-641-4981   ° °Mobile Crisis Teams °Organization         Address  Phone  Notes  °[facility], Mobile Crisis Care Unit  1-877-626-1772   °Assertive °[facility] ° [address]  [place], [place] 336-834-9664   °Sharon DeEsch [address] °[place] 336-554-5454   ° °Self-Help/Support Groups °Organization         Address  Phone             Notes  °[facility] of  - variety of support groups  336- 373-1402 Call for more information  °Narcotics Anonymous (NA), [facility] [address], °[place]  2 meetings at this location  ° °  Residential Treatment Programs Organization         Address  Phone  Notes  [facility] [address],    [place]  [phone]   [facility]  [address], [place] [phone], [place], [place] [phone]   [facility] [address], [place] [phone] Admissions: 8am-3pm M-F  [facility] [address]    [place], [place] [phone]   [facility] [address], [place], [place] [phone]   [facility] [address]  [place], [place] [phone]   [facility] Intensive Outpatient [address], [place], [place] [phone]   [facility] ([facility] [address]  [place], [place] [phone] or (530) [phone]   [facility] ([facility] [address] [place], [place] [phone](830) [phone] Accepts Medicaid  [facility] [address]  [place] [phone] Substance Abuse/Addiction Treatment   [facility] Resources Organization         Address  Phone  Notes  [facility]  615-829-7407(888) [phone]   Angie FavaJulie Brannon, PhD [address], [address] [place], [place]   [phone](336) [phone] or (646) [phone](336) [phone]   [facility]   [address] [place], [place] [phone](336) [phone]   [facility] [address], [place], [place] [phone](336) ([phone] Insurance/Medicaid/sponsorship through [facility] [address]                                    [place], [place] [phone](336) [phone] Therapy/tele-psych/case    [facility] [address]   [place], [place] [phone]) [phone]    Dr. Lolly MustacheArfeen  [phone]) [phone]   [facility]  [employer] [facility] 1) [address], [place] 2) [address] [place] [address], [place] 3)  [address], [place] [phone](336) [phone](336) [phone]  [phone](336) [phone]   [facility] Child Abuse Hotline 602-601-4965(336) [phone] or (450)[phone](336) [phone] (After Hours)       Take the prescription as directed. Continue to take your usual prescriptions as previously directed.  Apply moist heat or ice to the area(s) of discomfort, for 15 minutes at a time, several times per day for the next few days.  Do not fall asleep on a heating or ice pack.  Call your regular OB/GYN doctor on Monday to schedule a follow up appointment in the next 2 days. Call your Pain Management doctor on Monday to schedule a follow up appointment within the next week. Return to the Emergency Department immediately if worsening.

## 2013-09-16 ENCOUNTER — Ambulatory Visit (INDEPENDENT_AMBULATORY_CARE_PROVIDER_SITE_OTHER): Payer: BC Managed Care – PPO | Admitting: Obstetrics & Gynecology

## 2013-09-16 ENCOUNTER — Encounter: Payer: Self-pay | Admitting: Obstetrics & Gynecology

## 2013-09-16 VITALS — BP 120/60 | Ht 61.0 in | Wt 127.4 lb

## 2013-09-16 DIAGNOSIS — R1032 Left lower quadrant pain: Secondary | ICD-10-CM

## 2013-09-16 DIAGNOSIS — N83209 Unspecified ovarian cyst, unspecified side: Secondary | ICD-10-CM

## 2013-09-16 MED ORDER — DESOGESTREL-ETHINYL ESTRADIOL 0.15-30 MG-MCG PO TABS: ORAL_TABLET | ORAL | Status: DC

## 2013-09-16 NOTE — Progress Notes (Signed)
Patient ID: Angel RheaCheri Hoeger, female   DOB: Sep 01, 1980, 33 y.o.   MRN: [id number] Chief Complaint  Patient presents with  . Follow-up    ER- pelvic pain. Had  U/S done.    HPI Left pelvic pain  ROS No burning with urination, frequency or urgency No nausea, vomiting or diarrhea Nor fever chills or other constitutional symptoms   Blood pressure 120/60, height 5\' 1"  (1.549 m), weight 127 lb 6.4 oz (57.788 kg).  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

## 2013-11-22 ENCOUNTER — Other Ambulatory Visit: Payer: Self-pay | Admitting: Neurology

## 2013-11-22 DIAGNOSIS — G8929 Other chronic pain: Secondary | ICD-10-CM

## 2013-11-22 DIAGNOSIS — M545 Low back pain, unspecified: Secondary | ICD-10-CM

## 2013-12-09 ENCOUNTER — Ambulatory Visit
Admission: RE | Admit: 2013-12-09 | Discharge: 2013-12-09 | Disposition: A | Discharge status: Home / Self Care | Payer: BC Managed Care – PPO | Source: Ambulatory Visit | Attending: Neurology | Admitting: Neurology

## 2013-12-09 ENCOUNTER — Other Ambulatory Visit: Payer: Self-pay

## 2013-12-09 VITALS — BP 96/60 | HR 72

## 2013-12-09 DIAGNOSIS — M545 Low back pain: Principal | ICD-10-CM

## 2013-12-09 DIAGNOSIS — G8929 Other chronic pain: Secondary | ICD-10-CM

## 2013-12-09 MED ORDER — DIAZEPAM 5 MG PO TABS
10.0000 mg | ORAL_TABLET | Freq: Once | ORAL | Status: AC
  Administered 2013-12-09: 5 mg via ORAL

## 2013-12-09 MED ORDER — IOHEXOL 180 MG/ML  SOLN
1.0000 mL | Freq: Once | INTRAMUSCULAR | Status: AC | PRN
  Administered 2013-12-09: 1 mL via EPIDURAL

## 2013-12-09 MED ORDER — METHYLPREDNISOLONE ACETATE 40 MG/ML INJ SUSP (RADIOLOG
120.0000 mg | Freq: Once | INTRAMUSCULAR | Status: AC
  Administered 2013-12-09: 120 mg via EPIDURAL

## 2013-12-09 NOTE — Discharge Instructions (Signed)

## 2013-12-19 ENCOUNTER — Ambulatory Visit: Payer: BC Managed Care – PPO | Admitting: Obstetrics & Gynecology

## 2013-12-28 ENCOUNTER — Encounter (HOSPITAL_COMMUNITY): Payer: Self-pay | Admitting: Emergency Medicine

## 2013-12-28 ENCOUNTER — Emergency Department (HOSPITAL_COMMUNITY)
Admission: EM | Admit: 2013-12-28 | Discharge: 2013-12-29 | Disposition: A | Discharge status: Home / Self Care | Payer: BC Managed Care – PPO | Attending: Emergency Medicine | Admitting: Emergency Medicine

## 2013-12-28 ENCOUNTER — Emergency Department (HOSPITAL_COMMUNITY): Payer: BC Managed Care – PPO

## 2013-12-28 DIAGNOSIS — Z720 Tobacco use: Secondary | ICD-10-CM | POA: Diagnosis not present

## 2013-12-28 DIAGNOSIS — R1031 Right lower quadrant pain: Secondary | ICD-10-CM | POA: Insufficient documentation

## 2013-12-28 DIAGNOSIS — Z8639 Personal history of other endocrine, nutritional and metabolic disease: Secondary | ICD-10-CM | POA: Diagnosis not present

## 2013-12-28 DIAGNOSIS — Z87448 Personal history of other diseases of urinary system: Secondary | ICD-10-CM | POA: Insufficient documentation

## 2013-12-28 DIAGNOSIS — F419 Anxiety disorder, unspecified: Secondary | ICD-10-CM | POA: Diagnosis not present

## 2013-12-28 DIAGNOSIS — R112 Nausea with vomiting, unspecified: Secondary | ICD-10-CM | POA: Insufficient documentation

## 2013-12-28 DIAGNOSIS — Z79899 Other long term (current) drug therapy: Secondary | ICD-10-CM | POA: Insufficient documentation

## 2013-12-28 DIAGNOSIS — G8929 Other chronic pain: Secondary | ICD-10-CM | POA: Diagnosis not present

## 2013-12-28 DIAGNOSIS — R109 Unspecified abdominal pain: Secondary | ICD-10-CM

## 2013-12-28 LAB — URINALYSIS, ROUTINE W REFLEX MICROSCOPIC
BILIRUBIN URINE: NEGATIVE
Glucose, UA: NEGATIVE mg/dL
Hgb urine dipstick: NEGATIVE
Ketones, ur: NEGATIVE mg/dL
LEUKOCYTES UA: NEGATIVE
Nitrite: NEGATIVE
Protein, ur: NEGATIVE mg/dL
UROBILINOGEN UA: 0.2 mg/dL (ref 0.0–1.0)
pH: 5 (ref 5.0–8.0)

## 2013-12-28 LAB — BASIC METABOLIC PANEL
ANION GAP: 11 (ref 5–15)
BUN: 4 mg/dL — ABNORMAL LOW (ref 6–23)
CO2: 31 mEq/L (ref 19–32)
Calcium: 8.9 mg/dL (ref 8.4–10.5)
Chloride: 97 mEq/L (ref 96–112)
Creatinine, Ser: 0.54 mg/dL (ref 0.50–1.10)
GFR calc Af Amer: >90 mL/min (ref >90)
GLUCOSE: 77 mg/dL (ref 70–99)
POTASSIUM: 3.8 meq/L (ref 3.7–5.3)
SODIUM: 139 meq/L (ref 137–147)

## 2013-12-28 LAB — CBC
HCT: 40.3 % (ref 36.0–46.0)
Hemoglobin: 13.6 g/dL (ref 12.0–15.0)
MCH: 34.8 pg — ABNORMAL HIGH (ref 26.0–34.0)
MCHC: 33.7 g/dL (ref 30.0–36.0)
MCV: 103.1 fL — ABNORMAL HIGH (ref 78.0–100.0)
PLATELETS: 260 10*3/uL (ref 150–400)
RBC: 3.91 MIL/uL (ref 3.87–5.11)
RDW: 12.7 % (ref 11.5–15.5)
WBC: 14.8 10*3/uL — AB (ref 4.0–10.5)

## 2013-12-28 MED ORDER — HYDROMORPHONE HCL 1 MG/ML IJ SOLN
0.5000 mg | Freq: Once | INTRAMUSCULAR | Status: AC
  Administered 2013-12-28: 0.5 mg via INTRAVENOUS
  Filled 2013-12-28: qty 1

## 2013-12-28 MED ORDER — ONDANSETRON HCL 4 MG/2ML IJ SOLN
4.0000 mg | Freq: Once | INTRAMUSCULAR | Status: AC
  Administered 2013-12-28: 4 mg via INTRAVENOUS
  Filled 2013-12-28: qty 2

## 2013-12-28 MED ORDER — FENTANYL CITRATE 0.05 MG/ML IJ SOLN
50.0000 ug | Freq: Once | INTRAMUSCULAR | Status: AC
  Administered 2013-12-28: 50 ug via INTRAVENOUS
  Filled 2013-12-28: qty 2

## 2013-12-28 MED ORDER — ONDANSETRON HCL 4 MG/2ML IJ SOLN: 4.0000 mg | Freq: Once | INTRAMUSCULAR | Status: DC

## 2013-12-28 MED ORDER — IOHEXOL 300 MG/ML  SOLN
50.0000 mL | Freq: Once | INTRAMUSCULAR | Status: AC | PRN
  Administered 2013-12-28: 50 mL via ORAL

## 2013-12-28 NOTE — ED Notes (Signed)
Pt c/o lower right abd pain. Pt c/o nausea and vomited once last night.

## 2013-12-28 NOTE — ED Provider Notes (Signed)
The patient is a 33 year old female, history of hysterectomy and right oophorectomy, history of cholecystectomy. She also has a history of substance abuse of opiate medications.  She states she has had ttp in the RLQ for the last 10 days, is fluctuating, worsening and becoming more frequent.  She has no urinary or bowel sx.  On exam has ttp in the RLQ, no other abd ttp - very soft and non peritoneal.  Of note the pt is very focused on pain control, more so than on diagnosis.    Labs, CT scan pending, pain control.  Medical screening examination/treatment/procedure(s) were conducted as a shared visit with non-physician practitioner(s) and myself.  I personally evaluated the patient during the encounter.  Clinical Impression:   Final diagnoses:  Abdominal pain, unspecified abdominal location         Vida RollerBrian D Miley Lindon, MD 12/29/13 1456

## 2013-12-29 MED ORDER — IOHEXOL 300 MG/ML  SOLN
100.0000 mL | Freq: Once | INTRAMUSCULAR | Status: AC | PRN
  Administered 2013-12-29: 100 mL via INTRAVENOUS

## 2013-12-29 MED ORDER — HYDROCODONE-ACETAMINOPHEN 5-325 MG PO TABS
1.0000 | ORAL_TABLET | Freq: Once | ORAL | Status: AC
  Administered 2013-12-29: 1 via ORAL
  Filled 2013-12-29: qty 1

## 2013-12-29 MED ORDER — PROMETHAZINE HCL 25 MG/ML IJ SOLN
25.0000 mg | Freq: Once | INTRAMUSCULAR | Status: AC
  Administered 2013-12-29: 25 mg via INTRAVENOUS
  Filled 2013-12-29: qty 1

## 2013-12-29 NOTE — ED Provider Notes (Signed)
CSN: [id number]     Arrival date & time 12/28/13  2142 History   First MD Initiated Contact with Patient 12/28/13 2156     Chief Complaint  Patient presents with  . Abdominal Pain   [name] is a 33 y.o. female with a hx of hysterectomy, right oophorectomy and cholestectomy who presents to the ED c/o throbbing RLQ pain x 10 days. Reports that her pain has been gradually worsening and she started vomiting last night. She has a hx of substance abuse and is prescribed Suboxone, reports she has not taken the suboxone in the past 2 days because it was not helping her pain. Reports taking Tylenol 1 g 6 hours PTA without relief. She has not attempted other treatments. She reports movement and bumpy car rides makes her pain worse. She denies fevers, chills, hematochezia, melena, hematuria, dysuria, vaginal discharge, hematemesis.  Denies alcohol use in over a year.   (Consider location/radiation/quality/duration/timing/severity/associated sxs/prior Treatment) Patient is a 33 y.o. female presenting with abdominal pain. The history is provided by the patient.  Abdominal Pain Pain location:  RLQ Pain quality: throbbing   Pain radiates to:  Does not radiate Pain severity:  Severe Onset quality:  Gradual Duration:  10 days Timing:  Intermittent Progression:  Waxing and waning Chronicity:  New Context: not previous surgeries and not trauma   Relieved by:  Not moving Worsened by:  Movement and palpation Associated symptoms: nausea and vomiting   Associated symptoms: no chest pain, no chills, no constipation, no cough, no diarrhea, no dysuria, no fever, no hematemesis, no hematochezia, no hematuria, no melena, no shortness of breath, no sore throat, no vaginal bleeding and no vaginal discharge     Past Medical History  Diagnosis Date  . Hypokalemia   . Narcotic abuse     5 yrs ago lortab, off few yrs  . Anxiety   . Chronic pain   . Endometriosis   . 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 Chronic  headache   . Chronic back pain   . Chronic pelvic pain in female    Past Surgical History  Procedure Laterality Date  . Tonsillectomy    . Cholecystectomy  2010  . Tubal ligation    . Carpal tunnel release      both  . Abdominal hysterectomy      left ovary remains   Family History  Problem Relation Age of Onset  . Stroke Father   . Diverticulitis Father   . Heart disease    . Arthritis    . Diabetes    . Kidney disease     History  Substance Use Topics  . Smoking status: Current Every Day Smoker -- 1.50 packs/day for 18 years    Types: Cigarettes  . Smokeless tobacco: Never Used  . Alcohol Use: Yes     Comment: rarely   OB History   Grav Para Term Preterm Abortions TAB SAB Ect Mult Living   4 4 4       4      Review of Systems  Constitutional: Negative for fever and chills.  HENT: Negative for sore throat.   Respiratory: Negative for cough and shortness of breath.   Cardiovascular: Negative for chest pain.  Gastrointestinal: Positive for nausea, vomiting and abdominal pain. Negative for diarrhea, constipation, melena, hematochezia and hematemesis.  Genitourinary: Negative for dysuria, hematuria, vaginal bleeding and vaginal discharge.  All other systems reviewed and are negative.     Allergies  Divalproex sodium;  Fioricet-codeine; Ibuprofen; and Ketorolac tromethamine  Home Medications   Prior to Admission medications   Medication Sig Start Date End Date Taking? Authorizing Provider  doxepin (SINEQUAN) 25 MG capsule Take 25 mg by mouth 3 (three) times daily. 12/24/13  Yes Historical Provider, MD  methocarbamol (ROBAXIN) 500 MG tablet Take 500 mg by mouth 3 (three) times daily as needed. Muscle spasm 12/01/13  Yes Historical Provider, MD  SUBOXONE 4-1 MG FILM Place 1 Film under the tongue 2 (two) times daily.  09/08/13  Yes Historical Provider, MD  zolpidem (AMBIEN) 10 MG tablet Take 10 mg by mouth at bedtime as needed for sleep.  09/08/13  Yes Historical Provider,  MD   BP 110/76  Pulse 98  Temp(Src) 98 F (36.7 C) (Oral)  Resp 20  Ht 5\' 1"  (1.549 m)  Wt 125 lb (56.7 kg)  BMI 23.63 kg/m2  SpO2 100% Physical Exam  Nursing note and vitals reviewed. Constitutional: She appears well-developed and well-nourished. No distress.  HENT:  Head: Normocephalic and atraumatic.  Mouth/Throat: Oropharynx is clear and moist. No oropharyngeal exudate.  Eyes: Pupils are equal, round, and reactive to light. Right eye exhibits no discharge. Left eye exhibits no discharge.  Neck: Normal range of motion.  Cardiovascular: Normal rate, regular rhythm, normal heart sounds and intact distal pulses.  Exam reveals no gallop and no friction rub.   No murmur heard. Pulmonary/Chest: Effort normal and breath sounds normal. No respiratory distress. She has no wheezes. She has no rales.  Abdominal: Soft. Bowel sounds are normal. She exhibits no distension and no mass. There is tenderness. There is rebound.  RLQ tender to palpation. Positive Rovsing sign, positive obturator, positive psoas sign.   Musculoskeletal: Normal range of motion.  Lymphadenopathy:    She has no cervical adenopathy.  Neurological: She is alert. Coordination normal.  Skin: Skin is warm and dry. No rash noted. She is not diaphoretic. No erythema. No pallor.  Psychiatric: She has a normal mood and affect. Her behavior is normal.    ED Course  Procedures (including critical care time) Labs Review Labs Reviewed  BASIC METABOLIC PANEL - Abnormal; Notable for the following:    BUN 4 (*)    All other components within normal limits  CBC - Abnormal; Notable for the following:    WBC 14.8 (*)    MCV 103.1 (*)    MCH 34.8 (*)    All other components within normal limits  URINALYSIS, ROUTINE W REFLEX MICROSCOPIC - Abnormal; Notable for the following:    Specific Gravity, Urine >1.030 (*)    All other components within normal limits   Filed Vitals:   12/28/13 2150  BP: 110/76  Pulse: 98  Temp: 98 F  (36.7 C)  TempSrc: Oral  Resp: 20  Height: 5\' 1"  (1.549 m)  Weight: 125 lb (56.7 kg)  SpO2: 100%    Imaging Review No results found.   EKG Interpretation None      MDM   Final diagnoses:  None   Meds given in ED:  Medications  ondansetron (ZOFRAN) injection 4 mg (4 mg Intravenous Given 12/28/13 2251)  fentaNYL (SUBLIMAZE) injection 50 mcg (50 mcg Intravenous Given 12/28/13 2251)  iohexol (OMNIPAQUE) 300 MG/ML solution 50 mL (50 mLs Oral Contrast Given 12/28/13 2247)  HYDROmorphone (DILAUDID) injection 0.5 mg (0.5 mg Intravenous Given 12/28/13 2344)  iohexol (OMNIPAQUE) 300 MG/ML solution 100 mL (100 mLs Intravenous Contrast Given 12/29/13 0014)    New Prescriptions   No medications  on file   CBC shows slightly elevated WBC at 14.8. CT abd still pending at time of sign out. Patient seen in conjunction with Dr. Hyacinth MeekerMiller. Patient care transferred to Dr. Manus Gunningancour.     Lawana ChambersWilliam Duncan Ahlaya Ende, [place] 12/29/13 [phone]

## 2013-12-29 NOTE — ED Provider Notes (Signed)
Care assumed from Dr. Hyacinth MeekerMiller at 12 AM. CT pending to rule out appendicitis. History of ovary removal on the right.  CT negative for appendicitis or other acute pathology. Abdomen soft, patient tolerating PO.  BP 110/76  Pulse 98  Temp(Src) 98 F (36.7 C) (Oral)  Resp 20  Ht 5\' 1"  (1.549 m)  Wt 125 lb (56.7 kg)  BMI 23.63 kg/m2  SpO2 100%    Angel OctaveStephen Tiwan Schnitker, MD 12/29/13 [phone]

## 2013-12-29 NOTE — ED Provider Notes (Signed)
Medical screening examination/treatment/procedure(s) were conducted as a shared visit with non-physician practitioner(s) and myself.  I personally evaluated the patient during the encounter  Please see my separate respective documentation pertaining to this patient encounter   Vida RollerBrian D Devinn Voshell, MD 12/29/13 [phone]

## 2013-12-29 NOTE — Discharge Instructions (Signed)
Abdominal Pain Your appendix appears normal. Follow up with your doctor. Return to the ED if you develop new or worsening symptoms. Many things can cause abdominal pain. Usually, abdominal pain is not caused by a disease and will improve without treatment. It can often be observed and treated at home. Your health care provider will do a physical exam and possibly order blood tests and X-rays to help determine the seriousness of your pain. However, in many cases, more time must pass before a clear cause of the pain can be found. Before that point, your health care provider may not know if you need more testing or further treatment. HOME CARE INSTRUCTIONS  Monitor your abdominal pain for any changes. The following actions may help to alleviate any discomfort you are experiencing:  Only take over-the-counter or prescription medicines as directed by your health care provider.  Do not take laxatives unless directed to do so by your health care provider.  Try a clear liquid diet (broth, tea, or water) as directed by your health care provider. Slowly move to a bland diet as tolerated. SEEK MEDICAL CARE IF:  You have unexplained abdominal pain.  You have abdominal pain associated with nausea or diarrhea.  You have pain when you urinate or have a bowel movement.  You experience abdominal pain that wakes you in the night.  You have abdominal pain that is worsened or improved by eating food.  You have abdominal pain that is worsened with eating fatty foods.  You have a fever. SEEK IMMEDIATE MEDICAL CARE IF:   Your pain does not go away within 2 hours.  You keep throwing up (vomiting).  Your pain is felt only in portions of the abdomen, such as the right side or the left lower portion of the abdomen.  You pass bloody or black tarry stools. MAKE SURE YOU:  Understand these instructions.   Will watch your condition.   Will get help right away if you are not doing well or get worse.   Document Released: 12/04/2004 Document Revised: 03/01/2013 Document Reviewed: 11/03/2012 [facility] Patient Information 2015 [place], [place]. This information is not intended to replace advice given to you by your health care provider. Make sure you discuss any questions you have with your health care provider.

## 2014-01-09 ENCOUNTER — Encounter (HOSPITAL_COMMUNITY): Payer: Self-pay | Admitting: Emergency Medicine

## 2014-01-23 ENCOUNTER — Encounter (HOSPITAL_COMMUNITY): Payer: Self-pay | Admitting: *Deleted

## 2014-01-23 ENCOUNTER — Emergency Department (HOSPITAL_COMMUNITY)
Admission: EM | Admit: 2014-01-23 | Discharge: 2014-01-23 | Disposition: A | Discharge status: Home / Self Care | Payer: BC Managed Care – PPO | Attending: Emergency Medicine | Admitting: Emergency Medicine

## 2014-01-23 DIAGNOSIS — G8929 Other chronic pain: Secondary | ICD-10-CM | POA: Diagnosis not present

## 2014-01-23 DIAGNOSIS — M545 Low back pain, unspecified: Secondary | ICD-10-CM

## 2014-01-23 DIAGNOSIS — Z79899 Other long term (current) drug therapy: Secondary | ICD-10-CM | POA: Insufficient documentation

## 2014-01-23 DIAGNOSIS — Z8742 Personal history of other diseases of the female genital tract: Secondary | ICD-10-CM | POA: Diagnosis not present

## 2014-01-23 DIAGNOSIS — Z87448 Personal history of other diseases of urinary system: Secondary | ICD-10-CM | POA: Insufficient documentation

## 2014-01-23 DIAGNOSIS — Z720 Tobacco use: Secondary | ICD-10-CM | POA: Insufficient documentation

## 2014-01-23 DIAGNOSIS — Z8639 Personal history of other endocrine, nutritional and metabolic disease: Secondary | ICD-10-CM | POA: Insufficient documentation

## 2014-01-23 DIAGNOSIS — F419 Anxiety disorder, unspecified: Secondary | ICD-10-CM | POA: Diagnosis not present

## 2014-01-23 MED ORDER — TRAMADOL HCL 50 MG PO TABS
50.0000 mg | ORAL_TABLET | Freq: Once | ORAL | Status: AC
  Administered 2014-01-23: 50 mg via ORAL
  Filled 2014-01-23: qty 1

## 2014-01-23 MED ORDER — TRAMADOL HCL 50 MG PO TABS: 50.0000 mg | ORAL_TABLET | Freq: Four times a day (QID) | ORAL | Status: DC | PRN

## 2014-01-23 NOTE — ED Provider Notes (Signed)
CSN: [id number]     Arrival date & time 01/23/14  0830 History   This chart was scribed for Geoffery Lyonsouglas Laurence Crofford, MD by Abel PrestoKara Demonbreun, ED Scribe. This patient was seen in room APA01/APA01 and the patient's care was started at 8:56 AM.    Chief Complaint  Patient presents with  . Back Pain       The history is provided by the patient. No language interpreter was used.    HPI Comments: [name] is a 33 y.o. female with history of slipped disc who presents to the Emergency Department complaining of recurrent  lower back pain with pain radiating to legs. She says she tried to pop back but felt a scraping pain that has become increasingly worse. Pt notes pain with ambulation. She states she is unable to urinate sometimes and notes and "upset stomach." Pt tried heating pads and muscle relaxers with no relief. Pt states she was given epidural shots after an MRI 2 years ago which worked for 6-7 weeks and again this October but felt no relief. Pt was previously using Copaxone strips but cannot currently afford more. Pt has no PCP.   Past Medical History  Diagnosis Date  . Hypokalemia   . Narcotic abuse     5 yrs ago lortab, off few yrs  . Anxiety   . Chronic pain   . Endometriosis   . 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 Chronic headache   . Chronic back pain   . Chronic pelvic pain in female    Past Surgical History  Procedure Laterality Date  . Tonsillectomy    . Cholecystectomy  2010  . Tubal ligation    . Carpal tunnel release      both  . Abdominal hysterectomy      left ovary remains   Family History  Problem Relation Age of Onset  . Stroke Father   . Diverticulitis Father   . Heart disease    . Arthritis    . Diabetes    . Kidney disease     History  Substance Use Topics  . Smoking status: Current Every Day Smoker -- 1.50 packs/day for 18 years    Types: Cigarettes  . Smokeless tobacco: Never Used  . Alcohol Use: Yes     Comment: rarely   OB History    Gravida Para Term  Preterm AB TAB SAB Ectopic Multiple Living   4 4 4       4      Review of Systems  A complete 10 system review of systems was obtained and all systems are negative except as noted in the HPI and PMH.     Allergies  Divalproex sodium; Fioricet-codeine; Ibuprofen; and Ketorolac tromethamine  Home Medications   Prior to Admission medications   Medication Sig Start Date End Date Taking? Authorizing Provider  doxepin (SINEQUAN) 25 MG capsule Take 25 mg by mouth 3 (three) times daily. 12/24/13   Historical Provider, MD  methocarbamol (ROBAXIN) 500 MG tablet Take 500 mg by mouth 3 (three) times daily as needed. Muscle spasm 12/01/13   Historical Provider, MD  SUBOXONE 4-1 MG FILM Place 1 Film under the tongue 2 (two) times daily.  09/08/13   Historical Provider, MD  zolpidem (AMBIEN) 10 MG tablet Take 10 mg by mouth at bedtime as needed for sleep.  09/08/13   Historical Provider, MD   BP 103/64 mmHg  Pulse 99  Temp(Src) 98.2 F (36.8 C) (Oral)  Resp 16  Ht 5\' 1"  (1.549 m)  Wt 130 lb (58.968 kg)  BMI 24.58 kg/m2  SpO2 100% Physical Exam  Constitutional: She is oriented to person, place, and time. She appears well-developed and well-nourished.  HENT:  Head: Normocephalic.  Eyes: Conjunctivae are normal.  Neck: Normal range of motion. Neck supple.  Cardiovascular: Normal rate and regular rhythm.   Pulmonary/Chest: Effort normal and breath sounds normal. No respiratory distress. She has no wheezes. She has no rales.  Abdominal: Soft.  Musculoskeletal: Normal range of motion.  TTP in the soft tissues of the lumbar region.   Neurological: She is alert and oriented to person, place, and time.  DTRs are trace and symmetrical in the bilateral lower extremities. Strength 5/5 in bilateral lower extremities. Able to ambulate without difficulty.   Skin: Skin is warm and dry.  Psychiatric: She has a normal mood and affect. Her behavior is normal.  Nursing note and vitals reviewed.   ED Course   Procedures (including critical care time) DIAGNOSTIC STUDIES: Oxygen Saturation is 100% on room air, normal by my interpretation.    COORDINATION OF CARE: 9:02 AM Discussed treatment plan with patient at beside, the patient agrees with the plan and has no further questions at this time.   Labs Review Labs Reviewed - No data to display  Imaging Review No results found.   EKG Interpretation None      MDM   Final diagnoses:  None    Patient presents with complaints of low back pain. Her physical examination is unremarkable. Strength and reflexes are equal bilaterally and there are no bowel or bladder complaints that would raise red flags for an emergent situation. She will be treated with anti-inflammatories, tramadol, and when necessary follow-up.  I personally performed the services described in this documentation, which was scribed in my presence. The recorded information has been reviewed and is accurate.     Geoffery Lyonsouglas Camdynn Maranto, MD 01/23/14 [phone]

## 2014-01-23 NOTE — Discharge Instructions (Signed)
Tramadol as prescribed as needed for pain.  Follow-up with your orthopedist to discuss physical therapy versus further imaging studies.   Back Pain, Adult Low back pain is very common. About 1 in 5 people have back pain.The cause of low back pain is rarely dangerous. The pain often gets better over time.About half of people with a sudden onset of back pain feel better in just 2 weeks. About 8 in 10 people feel better by 6 weeks.  CAUSES Some common causes of back pain include:  Strain of the muscles or ligaments supporting the spine.  Wear and tear (degeneration) of the spinal discs.  Arthritis.  Direct injury to the back. DIAGNOSIS Most of the time, the direct cause of low back pain is not known.However, back pain can be treated effectively even when the exact cause of the pain is unknown.Answering your caregiver's questions about your overall health and symptoms is one of the most accurate ways to make sure the cause of your pain is not dangerous. If your caregiver needs more information, he or she may order lab work or imaging tests (X-rays or MRIs).However, even if imaging tests show changes in your back, this usually does not require surgery. HOME CARE INSTRUCTIONS For many people, back pain returns.Since low back pain is rarely dangerous, it is often a condition that people can learn to [facility]manageon their own.   Remain active. It is stressful on the back to sit or stand in one place. Do not sit, drive, or stand in one place for more than 30 minutes at a time. Take short walks on level surfaces as soon as pain allows.Try to increase the length of time you walk each day.  Do not stay in bed.Resting more than 1 or 2 days can delay your recovery.  Do not avoid exercise or work.Your body is made to move.It is not dangerous to be active, even though your back may hurt.Your back will likely heal faster if you return to being active before your pain is gone.  Pay attention to your  body when you bend and lift. Many people have less discomfortwhen lifting if they bend their knees, keep the load close to their bodies,and avoid twisting. Often, the most comfortable positions are those that put less stress on your recovering back.  Find a comfortable position to sleep. Use a firm mattress and lie on your side with your knees slightly bent. If you lie on your back, put a pillow under your knees.  Only take over-the-counter or prescription medicines as directed by your caregiver. Over-the-counter medicines to reduce pain and inflammation are often the most helpful.Your caregiver may prescribe muscle relaxant drugs.These medicines help dull your pain so you can more quickly return to your normal activities and healthy exercise.  Put ice on the injured area.  Put ice in a plastic bag.  Place a towel between your skin and the bag.  Leave the ice on for 15-20 minutes, 03-04 times a day for the first 2 to 3 days. After that, ice and heat may be alternated to reduce pain and spasms.  Ask your caregiver about trying back exercises and gentle massage. This may be of some benefit.  Avoid feeling anxious or stressed.Stress increases muscle tension and can worsen back pain.It is important to recognize when you are anxious or stressed and learn ways to manage it.Exercise is a great option. SEEK MEDICAL CARE IF:  You have pain that is not relieved with rest or medicine.  You  have pain that does not improve in 1 week.  You have new symptoms.  You are generally not feeling well. SEEK IMMEDIATE MEDICAL CARE IF:   You have pain that radiates from your back into your legs.  You develop new bowel or bladder control problems.  You have unusual weakness or numbness in your arms or legs.  You develop nausea or vomiting.  You develop abdominal pain.  You feel faint. Document Released: 02/24/2005 Document Revised: 08/26/2011 Document Reviewed: 06/28/2013 [facility] Patient  Information 2015 [place], [place]. This information is not intended to replace advice given to you by your health care provider. Make sure you discuss any questions you have with your health care provider.

## 2014-01-23 NOTE — ED Notes (Signed)
Pt states chronic lower back pain with flare up 4 days ago.

## 2014-02-15 ENCOUNTER — Ambulatory Visit (HOSPITAL_COMMUNITY): Payer: BC Managed Care – PPO | Attending: Neurology | Admitting: Physical Therapy

## 2014-04-19 ENCOUNTER — Other Ambulatory Visit: Payer: Self-pay | Admitting: Neurology

## 2014-04-25 ENCOUNTER — Other Ambulatory Visit: Payer: Self-pay | Admitting: Neurology

## 2014-04-25 DIAGNOSIS — M545 Low back pain, unspecified: Secondary | ICD-10-CM

## 2014-05-07 ENCOUNTER — Emergency Department (HOSPITAL_COMMUNITY)
Admission: EM | Admit: 2014-05-07 | Discharge: 2014-05-07 | Disposition: A | Discharge status: Home / Self Care | Payer: BLUE CROSS/BLUE SHIELD | Attending: Emergency Medicine | Admitting: Emergency Medicine

## 2014-05-07 ENCOUNTER — Encounter (HOSPITAL_COMMUNITY): Payer: Self-pay | Admitting: Emergency Medicine

## 2014-05-07 DIAGNOSIS — M549 Dorsalgia, unspecified: Secondary | ICD-10-CM | POA: Diagnosis present

## 2014-05-07 DIAGNOSIS — F419 Anxiety disorder, unspecified: Secondary | ICD-10-CM | POA: Insufficient documentation

## 2014-05-07 DIAGNOSIS — Z8639 Personal history of other endocrine, nutritional and metabolic disease: Secondary | ICD-10-CM | POA: Insufficient documentation

## 2014-05-07 DIAGNOSIS — M545 Low back pain, unspecified: Secondary | ICD-10-CM

## 2014-05-07 DIAGNOSIS — Z720 Tobacco use: Secondary | ICD-10-CM | POA: Insufficient documentation

## 2014-05-07 DIAGNOSIS — Z87448 Personal history of other diseases of urinary system: Secondary | ICD-10-CM | POA: Insufficient documentation

## 2014-05-07 DIAGNOSIS — G8929 Other chronic pain: Secondary | ICD-10-CM

## 2014-05-07 MED ORDER — HYDROMORPHONE HCL 1 MG/ML IJ SOLN
1.0000 mg | Freq: Once | INTRAMUSCULAR | Status: AC
Start: 2014-05-07 — End: 2014-05-07
  Administered 2014-05-07: 1 mg via INTRAMUSCULAR
  Filled 2014-05-07: qty 1

## 2014-05-07 NOTE — ED Provider Notes (Signed)
CSN: [id number]     Arrival date & time 05/07/14  1713 History  This chart was scribed for non-physician practitioner, Burgess Amor, PA-C working with Vanetta Mulders, MD, by Abel Presto, ED Scribe. This patient was seen in room APFT20/APFT20 and the patient's care was started at 7:34 PM.     Chief Complaint  Patient presents with  . Back Pain     Patient is a 34 y.o. female presenting with back pain. The history is provided by the patient. No language interpreter was used.  Back Pain Associated symptoms: no abdominal pain, no chest pain, no dysuria, no fever, no numbness and no weakness    HPI Comments: [name] is a 34 y.o. female who presents to the Emergency Department complaining of chronic low back pain worsening yesterday. Pt with h/o 2 degenerative disc. Pt notes pain radiates down left leg. Pt notes leaning forward gives mild relief. Pt was seen by Dr. Renae Fickle, orthopedist last week. Pt has Rx for Percocet 5 which she takes every 8 hours but states it gives no relief. Pt has appointment for 03/01 for a cortisone injection. Pt denies any recent injury, bowel or urinary incontinence, and saddle anesthesia. She denies numbness or weakness in her legs.  Past Medical History  Diagnosis Date  . Hypokalemia   . Narcotic abuse     5 yrs ago lortab, off few yrs  . Anxiety   . Chronic pain   . Endometriosis   . 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 Chronic headache   . Chronic back pain   . Chronic pelvic pain in female    Past Surgical History  Procedure Laterality Date  . Tonsillectomy    . Cholecystectomy  2010  . Tubal ligation    . Carpal tunnel release      both  . Abdominal hysterectomy      left ovary remains   Family History  Problem Relation Age of Onset  . Stroke Father   . Diverticulitis Father   . Heart disease    . Arthritis    . Diabetes    . Kidney disease     History  Substance Use Topics  . Smoking status: Current Every Day Smoker -- 1.50 packs/day for 18  years    Types: Cigarettes  . Smokeless tobacco: Never Used  . Alcohol Use: Yes     Comment: rarely   OB History    Gravida Para Term Preterm AB TAB SAB Ectopic Multiple Living   Review of Systems  Constitutional: Negative for fever.  Respiratory: Negative for shortness of breath.   Cardiovascular: Negative for chest pain and leg swelling.  Gastrointestinal: Negative for abdominal pain, constipation and abdominal distention.  Genitourinary: Negative for dysuria, urgency, frequency, flank pain and difficulty urinating.  Musculoskeletal: Positive for back pain. Negative for joint swelling and gait problem.  Skin: Negative for rash.  Neurological: Negative for weakness and numbness.      Allergies  Divalproex sodium; Fioricet-codeine; Ibuprofen; and Ketorolac tromethamine  Home Medications   Prior to Admission medications   Medication Sig Start Date End Date Taking? Authorizing Provider  ALPRAZolam Prudy Feeler) 0.5 MG tablet Take 0.5 mg by mouth 2 (two) times daily as needed for anxiety.   Yes Historical Provider, MD  diphenhydrAMINE (BENADRYL) 25 MG tablet Take 25 mg by mouth every 6 (six) hours as needed for allergies.   Yes Historical Provider,  MD  oxyCODONE-acetaminophen (PERCOCET/ROXICET) 5-325 MG per tablet Take 1 tablet by mouth every 4 (four) hours as needed for severe pain.   Yes Historical Provider, MD  traMADol (ULTRAM) 50 MG tablet Take 1 tablet (50 mg total) by mouth every 6 (six) hours as needed. Patient not taking: Reported on 05/07/2014 01/23/14   Geoffery Lyonsouglas Delo, MD  zolpidem (AMBIEN) 10 MG tablet Take 10 mg by mouth at bedtime as needed for sleep.  09/08/13   Historical Provider, MD   BP 99/56 mmHg  Pulse 78  Temp(Src) 98.1 F (36.7 C) (Oral)  Resp 20  Ht 5\' 1"  (1.549 m)  Wt 130 lb (58.968 kg)  BMI 24.58 kg/m2  SpO2 100% Physical Exam  Constitutional: She is oriented to person, place, and time. She appears well-developed and well-nourished.   HENT:  Head: Normocephalic.  Eyes: Conjunctivae are normal.  Neck: Normal range of motion. Neck supple.  Cardiovascular: Normal rate.   Pedal pulses normal.  Pulmonary/Chest: Effort normal.  Abdominal: Bowel sounds are normal.  Musculoskeletal: Normal range of motion. She exhibits no edema.       Lumbar back: She exhibits tenderness. She exhibits no swelling, no edema and no spasm.  No midline back tenderness. Positive straight leg raise on the left. Left paralumbar TTP  Neurological: She is alert and oriented to person, place, and time. She has normal strength. She displays no atrophy and no tremor. No sensory deficit. Gait normal.  Reflex Scores:      Patellar reflexes are 2+ on the right side and 2+ on the left side.      Achilles reflexes are 2+ on the right side and 2+ on the left side. No strength deficit noted in hip and knee flexor and extensor muscle groups.  Ankle flexion and extension intact. Reflexes intact.  Skin: Skin is warm and dry.  Psychiatric: She has a normal mood and affect.  Nursing note and vitals reviewed.   ED Course  Procedures (including critical care time) DIAGNOSTIC STUDIES: Oxygen Saturation is 100% on room air, normal by my interpretation.    COORDINATION OF CARE: 7:43 PM Discussed treatment plan with patient at beside, the patient agrees with the plan and has no further questions at this time.   Labs Review Labs Reviewed - No data to display  Imaging Review No results found.   EKG Interpretation None      MDM   Final diagnoses:  Chronic low back pain   No neuro deficit on exam or by history to suggest emergent or surgical presentation.  Also discussed worsened sx that should prompt immediate re-evaluation including distal weakness, bowel/bladder retention/incontinence.   Pt was given dilaudid 1 mg IM injection.  Advised prescriptions will need to be obtained from her orthopedist.  No neuro deficit on exam or by history to suggest  emergent or surgical presentation.  Also discussed worsened sx that should prompt immediate re-evaluation including distal weakness, bowel/bladder retention/incontinence.  Pt not driving home.  I personally performed the services described in this documentation, which was scribed in my presence. The recorded information has been reviewed and is accurate.      Burgess AmorJulie Kalanie Fewell, PA-C 05/08/14 1326  Vanetta MuldersScott Zackowski, MD 05/09/14 [phone]

## 2014-05-07 NOTE — ED Notes (Signed)
Pt c/o ongoing back pain, worse since yesterday.

## 2014-05-07 NOTE — Discharge Instructions (Signed)

## 2014-05-11 ENCOUNTER — Emergency Department (HOSPITAL_COMMUNITY)
Admission: EM | Admit: 2014-05-11 | Discharge: 2014-05-11 | Disposition: A | Discharge status: Home / Self Care | Payer: BLUE CROSS/BLUE SHIELD | Attending: Emergency Medicine | Admitting: Emergency Medicine

## 2014-05-11 ENCOUNTER — Encounter (HOSPITAL_COMMUNITY): Payer: Self-pay | Admitting: *Deleted

## 2014-05-11 DIAGNOSIS — M545 Low back pain: Secondary | ICD-10-CM | POA: Diagnosis not present

## 2014-05-11 DIAGNOSIS — G8929 Other chronic pain: Secondary | ICD-10-CM | POA: Insufficient documentation

## 2014-05-11 DIAGNOSIS — M549 Dorsalgia, unspecified: Secondary | ICD-10-CM

## 2014-05-11 MED ORDER — PROMETHAZINE HCL 25 MG PO TABS: 25.0000 mg | ORAL_TABLET | Freq: Four times a day (QID) | ORAL | Status: DC | PRN

## 2014-05-11 MED ORDER — OXYCODONE-ACETAMINOPHEN 5-325 MG PO TABS
1.0000 | ORAL_TABLET | Freq: Once | ORAL | Status: AC
  Administered 2014-05-11: 1 via ORAL
  Filled 2014-05-11: qty 1

## 2014-05-11 MED ORDER — PROMETHAZINE HCL 25 MG/ML IJ SOLN
25.0000 mg | Freq: Once | INTRAMUSCULAR | Status: AC
  Administered 2014-05-11: 25 mg via INTRAMUSCULAR
  Filled 2014-05-11: qty 1

## 2014-05-11 NOTE — Discharge Instructions (Signed)
Call Dr. Renae FicklePaul or your primary care doctor to discuss pain management and any change they want you to have in your narcotic pain medication.

## 2014-05-11 NOTE — ED Provider Notes (Signed)
CSN: [id number]     Arrival date & time 05/11/14  1605 History   First MD Initiated Contact with Patient 05/11/14 1633     Chief Complaint  Patient presents with  . Back Pain     (Consider location/radiation/quality/duration/timing/severity/associated sxs/prior Treatment) Patient is a 34 y.o. female presenting with back pain. The history is provided by the patient.  Back Pain Location:  Lumbar spine Quality:  Shooting and stabbing Radiates to:  L posterior upper leg Pain severity:  Severe Pain is:  Same all the time Onset quality:  Gradual Duration:  24 months Timing:  Constant Progression:  Worsening  [name] is a 34 y.o. female who presents to the ED with back pain. She was here for same 2/28 and treated with Dilaudid 1 mg IM and referred back to her orthopedic doctor for pain management.  She reports that she did return and had an injection in her back 2 days ago. She has Rx for percocet but states that the caused nausea. She plans to start in a pain management clinic. She denies UTI symptoms.  Past Medical History  Diagnosis Date  . Hypokalemia   . Narcotic abuse     5 yrs ago lortab, off few yrs  . Anxiety   . Chronic pain   . Endometriosis   . Chronic headache   . Chronic back pain   . Chronic pelvic pain in female   . Renal disorder Cyst on Kidney   Past Surgical History  Procedure Laterality Date  . Tonsillectomy    . Cholecystectomy  2010  . Tubal ligation    . Carpal tunnel release      both  . Abdominal hysterectomy      left ovary remains   Family History  Problem Relation Age of Onset  . Stroke Father   . Diverticulitis Father   . Heart disease    . Arthritis    . Diabetes    . Kidney disease     History  Substance Use Topics  . Smoking status: Current Every Day Smoker -- 1.50 packs/day for 18 years    Types: Cigarettes  . Smokeless tobacco: Never Used  . Alcohol Use: Yes     Comment: rarely   OB History    Gravida Para Term Preterm AB  TAB SAB Ectopic Multiple Living   Review of Systems  Musculoskeletal: Positive for back pain.  all other systems neagtive    Allergies  Divalproex sodium; Fioricet-codeine; Ibuprofen; and Ketorolac tromethamine  Home Medications   Prior to Admission medications   Medication Sig Start Date End Date Taking? Authorizing Provider  ALPRAZolam Prudy Feeler) 0.5 MG tablet Take 0.5 mg by mouth 2 (two) times daily as needed for anxiety.    Historical Provider, MD  diphenhydrAMINE (BENADRYL) 25 MG tablet Take 25 mg by mouth every 6 (six) hours as needed for allergies.    Historical Provider, MD  oxyCODONE-acetaminophen (PERCOCET/ROXICET) 5-325 MG per tablet Take 1 tablet by mouth every 4 (four) hours as needed for severe pain.    Historical Provider, MD  promethazine (PHENERGAN) 25 MG tablet Take 1 tablet (25 mg total) by mouth every 6 (six) hours as needed for nausea or vomiting. 05/11/14   [name] [name], NP  zolpidem (AMBIEN) 10 MG tablet Take 10 mg by mouth at bedtime as needed for sleep.  09/08/13   Historical Provider, MD   BP  115/80 mmHg  Pulse 94  Temp(Src) 98.5 F (36.9 C) (Oral)  Resp 16  Ht 5\' 1"  (1.549 m)  Wt 130 lb (58.968 kg)  BMI 24.58 kg/m2  SpO2 100% Physical Exam  Constitutional: She is oriented to person, place, and time. She appears well-developed and well-nourished. No distress.  HENT:  Head: Normocephalic and atraumatic.  Nose: Nose normal.  Eyes: EOM are normal.  Neck: Normal range of motion. Neck supple.  Cardiovascular: Normal rate and regular rhythm.   Pulmonary/Chest: Effort normal. She has no wheezes. She has no rales.  Abdominal: Soft. Bowel sounds are normal. There is no tenderness.  Musculoskeletal: Normal range of motion.       Lumbar back: She exhibits tenderness and pain. She exhibits no deformity, no spasm and normal pulse.       Back:  Pain with straight leg raises but patient is able to do SlR. Ambulates without foot drag.     Neurological: She is alert and oriented to person, place, and time. She has normal strength. No cranial nerve deficit or sensory deficit. Gait normal.  Reflex Scores:      Bicep reflexes are 2+ on the right side and 2+ on the left side.      Brachioradialis reflexes are 2+ on the right side and 2+ on the left side.      Patellar reflexes are 2+ on the right side and 2+ on the left side.      Achilles reflexes are 2+ on the right side and 2+ on the left side. Skin: Skin is warm and dry.  Psychiatric: She has a normal mood and affect. Her behavior is normal.  Nursing note and vitals reviewed.   ED Course  Procedures (including critical care time) Labs Review  MDM  [name] is a 34 y.o. female with chronic low back pain here requesting pain management. I discussed with the patient that as instructed on her previous visit 2/28 that her orthopedic doctor or her primary care doctor will need to manage her pain. They will decide if they want to change the narcotic she is taking. I will give Rx for Phenergan to help with the nausea that she has when taking the Percocet. I offered patient Rx for Tramadol but she states it does not do any good. Stable for d/c without neuro deficits.   Final diagnoses:  Chronic back pain    [facility] L Pope M Zaidin Blyden, NP 05/11/14 1752  Vida RollerBrian D Miller, MD 05/11/14 [phone]

## 2014-05-11 NOTE — ED Notes (Addendum)
Low back pain, Had injection in back on Tuesday. By Dr Jacqulyn BathLong.  Says she had rx for percocet but caused nausea.  Plans to start pain management

## 2014-06-07 ENCOUNTER — Emergency Department (HOSPITAL_COMMUNITY): Payer: BLUE CROSS/BLUE SHIELD

## 2014-06-07 ENCOUNTER — Emergency Department (HOSPITAL_COMMUNITY)
Admission: EM | Admit: 2014-06-07 | Discharge: 2014-06-07 | Disposition: A | Discharge status: Home / Self Care | Payer: BLUE CROSS/BLUE SHIELD | Attending: Emergency Medicine | Admitting: Emergency Medicine

## 2014-06-07 ENCOUNTER — Encounter (HOSPITAL_COMMUNITY): Payer: Self-pay | Admitting: *Deleted

## 2014-06-07 DIAGNOSIS — F419 Anxiety disorder, unspecified: Secondary | ICD-10-CM | POA: Diagnosis not present

## 2014-06-07 DIAGNOSIS — R1031 Right lower quadrant pain: Secondary | ICD-10-CM | POA: Diagnosis present

## 2014-06-07 DIAGNOSIS — Z87448 Personal history of other diseases of urinary system: Secondary | ICD-10-CM | POA: Diagnosis not present

## 2014-06-07 DIAGNOSIS — R110 Nausea: Secondary | ICD-10-CM | POA: Diagnosis not present

## 2014-06-07 DIAGNOSIS — K529 Noninfective gastroenteritis and colitis, unspecified: Secondary | ICD-10-CM | POA: Diagnosis not present

## 2014-06-07 DIAGNOSIS — Z720 Tobacco use: Secondary | ICD-10-CM | POA: Diagnosis not present

## 2014-06-07 DIAGNOSIS — Z8639 Personal history of other endocrine, nutritional and metabolic disease: Secondary | ICD-10-CM | POA: Insufficient documentation

## 2014-06-07 DIAGNOSIS — Z3202 Encounter for pregnancy test, result negative: Secondary | ICD-10-CM | POA: Diagnosis not present

## 2014-06-07 DIAGNOSIS — G8929 Other chronic pain: Secondary | ICD-10-CM | POA: Insufficient documentation

## 2014-06-07 LAB — CBC WITH DIFFERENTIAL/PLATELET
Basophils Absolute: 0 10*3/uL (ref 0.0–0.1)
Basophils Relative: 0 % (ref 0–1)
Eosinophils Absolute: 0.1 10*3/uL (ref 0.0–0.7)
Eosinophils Relative: 1 % (ref 0–5)
HCT: 40.5 % (ref 36.0–46.0)
HEMOGLOBIN: 13.6 g/dL (ref 12.0–15.0)
LYMPHS PCT: 21 % (ref 12–46)
Lymphs Abs: 3.4 10*3/uL (ref 0.7–4.0)
MCH: 35.2 pg — ABNORMAL HIGH (ref 26.0–34.0)
MCHC: 33.6 g/dL (ref 30.0–36.0)
MCV: 104.9 fL — AB (ref 78.0–100.0)
MONOS PCT: 3 % (ref 3–12)
Monocytes Absolute: 0.5 10*3/uL (ref 0.1–1.0)
NEUTROS PCT: 75 % (ref 43–77)
Neutro Abs: 12 10*3/uL — ABNORMAL HIGH (ref 1.7–7.7)
Platelets: 232 10*3/uL (ref 150–400)
RBC: 3.86 MIL/uL — ABNORMAL LOW (ref 3.87–5.11)
RDW: 13.2 % (ref 11.5–15.5)
WBC: 16.1 10*3/uL — ABNORMAL HIGH (ref 4.0–10.5)

## 2014-06-07 LAB — COMPREHENSIVE METABOLIC PANEL
ALK PHOS: 67 U/L (ref 39–117)
ALT: 15 U/L (ref 0–35)
AST: 21 U/L (ref 0–37)
Albumin: 3.9 g/dL (ref 3.5–5.2)
Anion gap: 7 (ref 5–15)
CO2: 26 mmol/L (ref 19–32)
Calcium: 8.7 mg/dL (ref 8.4–10.5)
Chloride: 106 mmol/L (ref 96–112)
Creatinine, Ser: 0.59 mg/dL (ref 0.50–1.10)
GFR calc Af Amer: >90 mL/min (ref >90)
Glucose, Bld: 85 mg/dL (ref 70–99)
POTASSIUM: 3.4 mmol/L — AB (ref 3.5–5.1)
SODIUM: 139 mmol/L (ref 135–145)
Total Bilirubin: 0.4 mg/dL (ref 0.3–1.2)
Total Protein: 6.7 g/dL (ref 6.0–8.3)

## 2014-06-07 LAB — URINALYSIS, ROUTINE W REFLEX MICROSCOPIC
Bilirubin Urine: NEGATIVE
Glucose, UA: NEGATIVE mg/dL
Hgb urine dipstick: NEGATIVE
KETONES UR: NEGATIVE mg/dL
Leukocytes, UA: NEGATIVE
Nitrite: NEGATIVE
PROTEIN: NEGATIVE mg/dL
Specific Gravity, Urine: <1.005 — ABNORMAL LOW (ref 1.005–1.030)
UROBILINOGEN UA: 0.2 mg/dL (ref 0.0–1.0)
pH: 6 (ref 5.0–8.0)

## 2014-06-07 LAB — PREGNANCY, URINE: Preg Test, Ur: NEGATIVE

## 2014-06-07 MED ORDER — OXYCODONE-ACETAMINOPHEN 5-325 MG PO TABS
1.0000 | ORAL_TABLET | Freq: Once | ORAL | Status: AC
  Administered 2014-06-07: 1 via ORAL
  Filled 2014-06-07: qty 1

## 2014-06-07 MED ORDER — CIPROFLOXACIN IN D5W 400 MG/200ML IV SOLN
400.0000 mg | Freq: Once | INTRAVENOUS | Status: AC
  Administered 2014-06-07: 400 mg via INTRAVENOUS
  Filled 2014-06-07: qty 200

## 2014-06-07 MED ORDER — HYDROMORPHONE HCL 1 MG/ML IJ SOLN
1.0000 mg | Freq: Once | INTRAMUSCULAR | Status: AC
  Administered 2014-06-07: 1 mg via INTRAVENOUS
  Filled 2014-06-07: qty 1

## 2014-06-07 MED ORDER — IOHEXOL 300 MG/ML  SOLN
100.0000 mL | Freq: Once | INTRAMUSCULAR | Status: AC | PRN
  Administered 2014-06-07: 100 mL via INTRAVENOUS

## 2014-06-07 MED ORDER — CIPROFLOXACIN HCL 500 MG PO TABS: 500.0000 mg | ORAL_TABLET | Freq: Two times a day (BID) | ORAL | Status: DC

## 2014-06-07 MED ORDER — SODIUM CHLORIDE 0.9 % IV BOLUS (SEPSIS)
1000.0000 mL | Freq: Once | INTRAVENOUS | Status: AC
  Administered 2014-06-07: 1000 mL via INTRAVENOUS

## 2014-06-07 MED ORDER — OXYCODONE-ACETAMINOPHEN 5-325 MG PO TABS: 1.0000 | ORAL_TABLET | Freq: Four times a day (QID) | ORAL | Status: DC | PRN

## 2014-06-07 MED ORDER — METRONIDAZOLE IN NACL 5-0.79 MG/ML-% IV SOLN
500.0000 mg | Freq: Once | INTRAVENOUS | Status: AC
  Administered 2014-06-07: 500 mg via INTRAVENOUS
  Filled 2014-06-07: qty 100

## 2014-06-07 MED ORDER — ONDANSETRON 4 MG PO TBDP: 4.0000 mg | ORAL_TABLET | Freq: Three times a day (TID) | ORAL | Status: DC | PRN

## 2014-06-07 MED ORDER — METRONIDAZOLE 500 MG PO TABS: 500.0000 mg | ORAL_TABLET | Freq: Two times a day (BID) | ORAL | Status: DC

## 2014-06-07 MED ORDER — ONDANSETRON HCL 4 MG/2ML IJ SOLN
4.0000 mg | Freq: Once | INTRAMUSCULAR | Status: AC
  Administered 2014-06-07: 4 mg via INTRAMUSCULAR
  Filled 2014-06-07: qty 2

## 2014-06-07 NOTE — ED Notes (Signed)
Pt states that her pain is unchanged from previous.  Fluids infusing.  Pt states that her bp always runs around 90 systolic.  Will continue to monitor.  No symptoms at this time related to bp.

## 2014-06-07 NOTE — ED Notes (Signed)
Pt states that we have done nothing to treat her pain and that this is ridiculous to come to the hospital and lay in agony for no reason.  Advised pt that we are trying to treat her pain and that her blood pressure is an obstacle that prevents us from giving too much medication.  Pt rolled eyes and insisted that the monitor was broken.  Manual bp obtained and reading agrees with monitor.  Pt states that nobody understands pain like her and that nobody cares that she is hurting.  Advised pt that she was just medicated with Dilaudid again and her pain should be improving.  Pt rolled over in bed and pulled cover over her head.

## 2014-06-07 NOTE — ED Notes (Signed)
CT notified of pt. Being finished with contrast; CT states it will be a two hour wait.

## 2014-06-07 NOTE — ED Notes (Signed)
EMS called out for abd pain, n/v/d x 4 days; given Morphing 4mg  en route.

## 2014-06-07 NOTE — ED Provider Notes (Signed)
CSN: [id number]     Arrival date & time 06/07/14  1337 History  This chart was scribed for Shon Baton, MD by Luisa Dago, Medical Scribe. This patient was seen in room APA04/APA04 and the patient's care was started at 1:58 PM.      Chief Complaint  Patient presents with  . Abdominal Pain   The history is provided by the patient and medical records. No language interpreter was used.   HPI Comments: [name] is a 34 y.o. female with PMhx of chronic pain and right oophorectomy was brought to the Emergency Department by EMS complaining of gradual onset persistent RLQ abdominal pain that started approximately 4 days ago. She is also complaining of associated nausea, chills, diaphoresis, and diarrhea. Pt states that the pain started out as "dull" in nature, however, now it has progressed to a "sharp" pain. Currently, she rates her pain as a "10/10". Pt states that the nausea is exacerbated by the smell of food which has prevented her from eating. She denies any chance of being pregnant. LNMP was 6 years ago. She denies any fever.   Past Medical History  Diagnosis Date  . Hypokalemia   . Narcotic abuse     5 yrs ago lortab, off few yrs  . Anxiety   . Chronic pain   . Endometriosis   . Chronic headache   . Chronic back pain   . Chronic pelvic pain in female   . Renal disorder Cyst on Kidney   Past Surgical History  Procedure Laterality Date  . Tonsillectomy    . Cholecystectomy  2010  . Tubal ligation    . Carpal tunnel release      both  . Abdominal hysterectomy      left ovary remains   Family History  Problem Relation Age of Onset  . Stroke Father   . Diverticulitis Father   . Heart disease    . Arthritis    . Diabetes    . Kidney disease     History  Substance Use Topics  . Smoking status: Current Every Day Smoker -- 1.50 packs/day for 18 years    Types: Cigarettes  . Smokeless tobacco: Never Used  . Alcohol Use: Yes     Comment: rarely   OB History     Gravida Woods Term Preterm AB TAB SAB Ectopic Multiple Living   Review of Systems  Constitutional: Positive for appetite change. Negative for fever.  Respiratory: Negative for cough, chest tightness and shortness of breath.   Cardiovascular: Negative for chest pain.  Gastrointestinal: Positive for nausea and abdominal pain. Negative for vomiting.  Genitourinary: Negative for dysuria.  Musculoskeletal: Negative for back pain.  Neurological: Negative for headaches.  Psychiatric/Behavioral: Negative for confusion.  All other systems reviewed and are negative.     Allergies  Fentanyl; Divalproex sodium; Fioricet-codeine; Ibuprofen; and Ketorolac tromethamine  Home Medications   Prior to Admission medications   Medication Sig Start Date End Date Taking? Authorizing Provider  ALPRAZolam Prudy Feeler) 0.5 MG tablet Take 0.5 mg by mouth 2 (two) times daily as needed for anxiety.   Yes Historical Provider, MD  diphenhydrAMINE (BENADRYL) 25 MG tablet Take 25 mg by mouth every 6 (six) hours as needed for allergies.   Yes Historical Provider, MD  oxyCODONE-acetaminophen (PERCOCET/ROXICET) 5-325 MG per tablet Take 1 tablet by mouth every 4 (four) hours as needed for severe pain.  Historical Provider, MD  promethazine (PHENERGAN) 25 MG tablet Take 1 tablet (25 mg total) by mouth every 6 (six) hours as needed for nausea or vomiting. Patient not taking: Reported on 06/07/2014 05/11/14   Janne NapoleonHope M Neese, NP   BP 99/59 mmHg  Pulse 72  Temp(Src) 98 F (36.7 C) (Oral)  Resp 16  Ht 5\' 1"  (1.549 m)  Wt 125 lb (56.7 kg)  BMI 23.63 kg/m2  SpO2 100%  Physical Exam  Constitutional: She is oriented to person, place, and time.  Ill-appearing but nontoxic  HENT:  Head: Normocephalic and atraumatic.  Eyes: Pupils are equal, round, and reactive to light.  Cardiovascular: Normal rate, regular rhythm and normal heart sounds.   No murmur heard. Pulmonary/Chest: Effort normal. No respiratory  distress. She has no wheezes.  Abdominal: Soft. Bowel sounds are normal. There is tenderness. There is rebound.  Tenderness palpation of the right lower quadrant, rebound tenderness noted, positive Rovsing's  Neurological: She is alert and oriented to person, place, and time.  Skin: Skin is warm and dry.  Psychiatric: She has a normal mood and affect.  Nursing note and vitals reviewed.   ED Course  Procedures (including critical care time)  DIAGNOSTIC STUDIES: Oxygen Saturation is 100% on RA, normal by my interpretation.    COORDINATION OF CARE: 2:04 PM- Pt advised of plan for treatment and pt agrees.  Labs Review Labs Reviewed  CBC WITH DIFFERENTIAL/PLATELET - Abnormal; Notable for the following:    WBC 16.1 (*)    RBC 3.86 (*)    MCV 104.9 (*)    MCH 35.2 (*)    Neutro Abs 12.0 (*)    All other components within normal limits  COMPREHENSIVE METABOLIC PANEL - Abnormal; Notable for the following:    Potassium 3.4 (*)    BUN <5 (*)    All other components within normal limits  URINALYSIS, ROUTINE W REFLEX MICROSCOPIC - Abnormal; Notable for the following:    Specific Gravity, Urine <1.005 (*)    All other components within normal limits  PREGNANCY, URINE    Imaging Review Ct Abdomen Pelvis W Contrast  06/07/2014   CLINICAL DATA:  Right lower quadrant pain with nausea and diarrhea.  EXAM: CT ABDOMEN AND PELVIS WITH CONTRAST  TECHNIQUE: Multidetector CT imaging of the abdomen and pelvis was performed using the standard protocol following bolus administration of intravenous contrast.  CONTRAST:  100mL OMNIPAQUE IOHEXOL 300 MG/ML  SOLN  COMPARISON:  12/29/2013  FINDINGS: There is slight edema in the mucosa of the sigmoid portion of the colon. The bowel otherwise appears normal including the terminal ileum and appendix.  The liver, spleen, pancreas, adrenal glands, and kidneys demonstrate no significant abnormality. Focal fatty infiltration of the lateral aspect of the left lobe of  the liver. 7 mm benign cyst in the lateral aspect of the mid left kidney.  No adenopathy. Bladder is normal. Small partially collapsed cyst on the otherwise normal left ovary. Right ovary is not visible and may have been removed.  No significant osseous abnormality.  IMPRESSION: Slight edema of the mucosa of the sigmoid portion of the colon which could represent focal colitis. No other significant abnormalities.   Electronically Signed   By: Francene BoyersJames  Maxwell M.D.   On: 06/07/2014 16:17     EKG Interpretation None      MDM   Final diagnoses:  Colitis   Patient presents with abdominal pain.  Uncomfortable appearing but nontoxic on exam. No signs of peritonitis. Right  lower quadrant tenderness to palpation with rebound. Patient given fluids, Dilaudid, and Zofran.  Lab work obtained and notable for leukocytosis at 16.1.  Otherwise unremarkable. CT scan abdomen and pelvis obtained which shows possible focal colitis. Given patient's diarrhea, feel this is consistent with patient's presentation. Patient given Cipro and Flagyl. Will transition patient to oral antibiotics and pain medications. Upon reviewing nursing notes, patient has voiced multiple times that her pain is been treated. She's been dose 2 with Dilaudid and I feel transition to by mouth pain medications at this time is appropriate.  Discussed diagnosis and treatment plan with patient and her family. They both stated understanding. Patient has been able to tolerate fluids by mouth.  After history, exam, and medical workup I feel the patient has been appropriately medically screened and is safe for discharge home. Pertinent diagnoses were discussed with the patient. Patient was given return precautions.  I personally performed the services described in this documentation, which was scribed in my presence. The recorded information has been reviewed and is accurate.    Shon Baton, MD 06/07/14 [phone]

## 2014-06-07 NOTE — Discharge Instructions (Signed)

## 2014-06-08 ENCOUNTER — Emergency Department (HOSPITAL_COMMUNITY)
Admission: EM | Admit: 2014-06-08 | Discharge: 2014-06-08 | Disposition: A | Discharge status: Home / Self Care | Payer: BLUE CROSS/BLUE SHIELD | Attending: Emergency Medicine | Admitting: Emergency Medicine

## 2014-06-08 ENCOUNTER — Encounter (HOSPITAL_COMMUNITY): Payer: Self-pay | Admitting: Emergency Medicine

## 2014-06-08 DIAGNOSIS — Z87448 Personal history of other diseases of urinary system: Secondary | ICD-10-CM | POA: Insufficient documentation

## 2014-06-08 DIAGNOSIS — R1031 Right lower quadrant pain: Secondary | ICD-10-CM | POA: Diagnosis present

## 2014-06-08 DIAGNOSIS — G8929 Other chronic pain: Secondary | ICD-10-CM | POA: Diagnosis not present

## 2014-06-08 DIAGNOSIS — Z720 Tobacco use: Secondary | ICD-10-CM | POA: Diagnosis not present

## 2014-06-08 DIAGNOSIS — K529 Noninfective gastroenteritis and colitis, unspecified: Secondary | ICD-10-CM | POA: Diagnosis not present

## 2014-06-08 DIAGNOSIS — Z8639 Personal history of other endocrine, nutritional and metabolic disease: Secondary | ICD-10-CM | POA: Diagnosis not present

## 2014-06-08 DIAGNOSIS — F419 Anxiety disorder, unspecified: Secondary | ICD-10-CM | POA: Diagnosis not present

## 2014-06-08 DIAGNOSIS — Z79899 Other long term (current) drug therapy: Secondary | ICD-10-CM | POA: Diagnosis not present

## 2014-06-08 LAB — COMPREHENSIVE METABOLIC PANEL
ALBUMIN: 3.6 g/dL (ref 3.5–5.2)
ALK PHOS: 58 U/L (ref 39–117)
ALT: 12 U/L (ref 0–35)
AST: 16 U/L (ref 0–37)
Anion gap: 5 (ref 5–15)
BILIRUBIN TOTAL: 0.3 mg/dL (ref 0.3–1.2)
BUN: <5 mg/dL — ABNORMAL LOW (ref 6–23)
CO2: 28 mmol/L (ref 19–32)
CREATININE: 0.52 mg/dL (ref 0.50–1.10)
Calcium: 8.2 mg/dL — ABNORMAL LOW (ref 8.4–10.5)
Chloride: 106 mmol/L (ref 96–112)
GFR calc Af Amer: >90 mL/min (ref >90)
GFR calc non Af Amer: >90 mL/min (ref >90)
Glucose, Bld: 89 mg/dL (ref 70–99)
Potassium: 3.3 mmol/L — ABNORMAL LOW (ref 3.5–5.1)
SODIUM: 139 mmol/L (ref 135–145)
Total Protein: 6.1 g/dL (ref 6.0–8.3)

## 2014-06-08 LAB — CBC WITH DIFFERENTIAL/PLATELET
BASOS PCT: 0 % (ref 0–1)
Basophils Absolute: 0 10*3/uL (ref 0.0–0.1)
EOS ABS: 0.2 10*3/uL (ref 0.0–0.7)
Eosinophils Relative: 1 % (ref 0–5)
HCT: 37.6 % (ref 36.0–46.0)
Hemoglobin: 12.7 g/dL (ref 12.0–15.0)
Lymphocytes Relative: 32 % (ref 12–46)
Lymphs Abs: 4.1 10*3/uL — ABNORMAL HIGH (ref 0.7–4.0)
MCH: 35.2 pg — ABNORMAL HIGH (ref 26.0–34.0)
MCHC: 33.8 g/dL (ref 30.0–36.0)
MCV: 104.2 fL — ABNORMAL HIGH (ref 78.0–100.0)
Monocytes Absolute: 0.7 10*3/uL (ref 0.1–1.0)
Monocytes Relative: 5 % (ref 3–12)
NEUTROS ABS: 7.9 10*3/uL — AB (ref 1.7–7.7)
NEUTROS PCT: 62 % (ref 43–77)
PLATELETS: 220 10*3/uL (ref 150–400)
RBC: 3.61 MIL/uL — ABNORMAL LOW (ref 3.87–5.11)
RDW: 13.1 % (ref 11.5–15.5)
WBC: 12.8 10*3/uL — ABNORMAL HIGH (ref 4.0–10.5)

## 2014-06-08 MED ORDER — ONDANSETRON HCL 4 MG/2ML IJ SOLN
4.0000 mg | Freq: Once | INTRAMUSCULAR | Status: AC
  Administered 2014-06-08: 4 mg via INTRAVENOUS
  Filled 2014-06-08: qty 2

## 2014-06-08 MED ORDER — HYDROMORPHONE HCL 1 MG/ML IJ SOLN
1.0000 mg | Freq: Once | INTRAMUSCULAR | Status: AC
  Administered 2014-06-08: 1 mg via INTRAVENOUS
  Filled 2014-06-08: qty 1

## 2014-06-08 MED ORDER — HYDROMORPHONE HCL 4 MG PO TABS: 4.0000 mg | ORAL_TABLET | Freq: Four times a day (QID) | ORAL | Status: DC | PRN

## 2014-06-08 NOTE — Discharge Instructions (Signed)
Follow up with your md next week. °

## 2014-06-08 NOTE — ED Notes (Signed)
Patient was seen yesterday for same; c/o of increased abdominal pain.  States was given Percocet and it's not helping.

## 2014-06-08 NOTE — ED Provider Notes (Signed)
CSN: [id number]     Arrival date & time 06/08/14  1952 History  This chart was scribed for Angel Berkshire, MD by Bronson Curb, ED Scribe. This patient was seen in room APA15/APA15 and the patient's care was started at 8:24 PM.   Chief Complaint  Patient presents with  . Abdominal Pain   Patient is a 34 y.o. female presenting with abdominal pain. The history is provided by the patient. No language interpreter was used.  Abdominal Pain Pain location:  RLQ Pain radiates to:  Does not radiate Pain severity:  Severe Duration:  5 days Timing:  Constant Progression:  Unchanged Chronicity:  New Relieved by:  Nothing Worsened by:  Nothing tried Ineffective treatments: Percocet. Associated symptoms: no chest pain, no cough, no diarrhea, no fatigue and no hematuria      HPI Comments: [name] is a 34 y.o. female, with history of chronic pain and narcotic abuse, who presents to the Emergency Department complaining of constant 10/10, non-radiating, RLQ abdominal pain for the past 5 days. Patient was seen here yesterday for the same and was prescribed Percocet. However she reports no relief with this medication and states that it makes her nauseous even with Zofran. No aggravating or alleviating factors noted. She denies any other symptoms.   Past Medical History  Diagnosis Date  . Hypokalemia   . Narcotic abuse     5 yrs ago lortab, off few yrs  . Anxiety   . Chronic pain   . Endometriosis   . Chronic headache   . Chronic back pain   . Chronic pelvic pain in female   . Renal disorder Cyst on Kidney   Past Surgical History  Procedure Laterality Date  . Tonsillectomy    . Cholecystectomy  2010  . Tubal ligation    . Carpal tunnel release      both  . Abdominal hysterectomy      left ovary remains   Family History  Problem Relation Age of Onset  . Stroke Father   . Diverticulitis Father   . Heart disease    . Arthritis    . Diabetes    . Kidney disease     History   Substance Use Topics  . Smoking status: Current Every Day Smoker -- 1.50 packs/day for 18 years    Types: Cigarettes  . Smokeless tobacco: Never Used  . Alcohol Use: Yes     Comment: rarely   OB History    Gravida Para Term Preterm AB TAB SAB Ectopic Multiple Living   Review of Systems  Constitutional: Negative for appetite change and fatigue.  HENT: Negative for congestion, ear discharge and sinus pressure.   Eyes: Negative for discharge.  Respiratory: Negative for cough.   Cardiovascular: Negative for chest pain.  Gastrointestinal: Positive for abdominal pain. Negative for diarrhea.  Genitourinary: Negative for frequency and hematuria.  Musculoskeletal: Negative for back pain.  Skin: Negative for rash.  Neurological: Negative for seizures and headaches.  Psychiatric/Behavioral: Negative for hallucinations.      Allergies  Fentanyl; Divalproex sodium; Fioricet-codeine; Ibuprofen; and Ketorolac tromethamine  Home Medications   Prior to Admission medications   Medication Sig Start Date End Date Taking? Authorizing Provider  ALPRAZolam Prudy Feeler) 0.5 MG tablet Take 0.5 mg by mouth 2 (two) times daily as needed for anxiety.   Yes Historical Provider, MD  ciprofloxacin (CIPRO) 500 MG tablet Take 1 tablet (500  mg total) by mouth every 12 (twelve) hours. 06/07/14  Yes Shon Batonourtney F Horton, MD  diphenhydrAMINE (BENADRYL) 25 MG tablet Take 25 mg by mouth every 6 (six) hours as needed for allergies.   Yes Historical Provider, MD  metroNIDAZOLE (FLAGYL) 500 MG tablet Take 1 tablet (500 mg total) by mouth 2 (two) times daily. 06/07/14  Yes Shon Batonourtney F Horton, MD  oxyCODONE-acetaminophen (PERCOCET/ROXICET) 5-325 MG per tablet Take 1-2 tablets by mouth every 6 (six) hours as needed for severe pain. 06/07/14  Yes Shon Batonourtney F Horton, MD  ondansetron (ZOFRAN ODT) 4 MG disintegrating tablet Take 1 tablet (4 mg total) by mouth every 8 (eight) hours as needed for nausea or  vomiting. Patient not taking: Reported on 06/08/2014 06/07/14   Shon Batonourtney F Horton, MD  promethazine (PHENERGAN) 25 MG tablet Take 1 tablet (25 mg total) by mouth every 6 (six) hours as needed for nausea or vomiting. Patient not taking: Reported on 06/07/2014 05/11/14   Janne NapoleonHope M Neese, NP   Triage Vitals: BP 112/88 mmHg  Pulse 71  Temp(Src) 98.3 F (36.8 C) (Oral)  Resp 16  Ht 5\' 1"  (1.549 m)  Wt 125 lb (56.7 kg)  BMI 23.63 kg/m2  SpO2 100%  Physical Exam  Constitutional: She is oriented to person, place, and time. She appears well-developed.  HENT:  Head: Normocephalic.  Eyes: Conjunctivae and EOM are normal. No scleral icterus.  Neck: Neck supple. No thyromegaly present.  Cardiovascular: Normal rate, regular rhythm and normal heart sounds.  Exam reveals no gallop and no friction rub.   No murmur heard. Pulmonary/Chest: Effort normal. No stridor. She has no wheezes. She has no rales. She exhibits no tenderness.  Abdominal: Soft. She exhibits no distension. There is tenderness in the right lower quadrant. There is no rebound.  Moderate RLQ tenderness.  Musculoskeletal: Normal range of motion. She exhibits no edema.  Lymphadenopathy:    She has no cervical adenopathy.  Neurological: She is oriented to person, place, and time. She exhibits normal muscle tone. Coordination normal.  Skin: No rash noted. No erythema.  Psychiatric: She has a normal mood and affect. Her behavior is normal.  Nursing note and vitals reviewed.   ED Course  Procedures (including critical care time)  DIAGNOSTIC STUDIES: Oxygen Saturation is 100% on room air, normal by my interpretation.    COORDINATION OF CARE: At 2026 Discussed treatment plan with patient which includes pain medication. Patient agrees.   Labs Review Labs Reviewed - No data to display  Imaging Review Ct Abdomen Pelvis W Contrast  06/07/2014   CLINICAL DATA:  Right lower quadrant pain with nausea and diarrhea.  EXAM: CT ABDOMEN AND PELVIS  WITH CONTRAST  TECHNIQUE: Multidetector CT imaging of the abdomen and pelvis was performed using the standard protocol following bolus administration of intravenous contrast.  CONTRAST:  100mL OMNIPAQUE IOHEXOL 300 MG/ML  SOLN  COMPARISON:  12/29/2013  FINDINGS: There is slight edema in the mucosa of the sigmoid portion of the colon. The bowel otherwise appears normal including the terminal ileum and appendix.  The liver, spleen, pancreas, adrenal glands, and kidneys demonstrate no significant abnormality. Focal fatty infiltration of the lateral aspect of the left lobe of the liver. 7 mm benign cyst in the lateral aspect of the mid left kidney.  No adenopathy. Bladder is normal. Small partially collapsed cyst on the otherwise normal left ovary. Right ovary is not visible and may have been removed.  No significant osseous abnormality.  IMPRESSION: Slight edema of the  mucosa of the sigmoid portion of the colon which could represent focal colitis. No other significant abnormalities.   Electronically Signed   By: Francene Boyers M.D.   On: 06/07/2014 16:17     EKG Interpretation None      MDM   Final diagnoses:  None    Colitis with abd pain,  Improving wbc,   tx with continue cipro and flagyl and start dilaudid pills  The chart was scribed for me under my direct supervision.  I personally performed the history, physical, and medical decision making and all procedures in the evaluation of this patient.Angel Berkshire, MD 06/08/14 2228

## 2014-06-21 ENCOUNTER — Emergency Department (HOSPITAL_COMMUNITY): Payer: BLUE CROSS/BLUE SHIELD

## 2014-06-21 ENCOUNTER — Emergency Department (HOSPITAL_COMMUNITY)
Admission: EM | Admit: 2014-06-21 | Discharge: 2014-06-21 | Disposition: A | Discharge status: Home / Self Care | Payer: BLUE CROSS/BLUE SHIELD | Attending: Emergency Medicine | Admitting: Emergency Medicine

## 2014-06-21 ENCOUNTER — Encounter (HOSPITAL_COMMUNITY): Payer: Self-pay

## 2014-06-21 DIAGNOSIS — Z8742 Personal history of other diseases of the female genital tract: Secondary | ICD-10-CM | POA: Insufficient documentation

## 2014-06-21 DIAGNOSIS — F419 Anxiety disorder, unspecified: Secondary | ICD-10-CM | POA: Insufficient documentation

## 2014-06-21 DIAGNOSIS — Z79899 Other long term (current) drug therapy: Secondary | ICD-10-CM | POA: Insufficient documentation

## 2014-06-21 DIAGNOSIS — Z90710 Acquired absence of both cervix and uterus: Secondary | ICD-10-CM | POA: Diagnosis not present

## 2014-06-21 DIAGNOSIS — R197 Diarrhea, unspecified: Secondary | ICD-10-CM | POA: Insufficient documentation

## 2014-06-21 DIAGNOSIS — Z87448 Personal history of other diseases of urinary system: Secondary | ICD-10-CM | POA: Insufficient documentation

## 2014-06-21 DIAGNOSIS — Z9851 Tubal ligation status: Secondary | ICD-10-CM | POA: Insufficient documentation

## 2014-06-21 DIAGNOSIS — Z792 Long term (current) use of antibiotics: Secondary | ICD-10-CM | POA: Diagnosis not present

## 2014-06-21 DIAGNOSIS — M549 Dorsalgia, unspecified: Secondary | ICD-10-CM | POA: Insufficient documentation

## 2014-06-21 DIAGNOSIS — Z720 Tobacco use: Secondary | ICD-10-CM | POA: Insufficient documentation

## 2014-06-21 DIAGNOSIS — R1031 Right lower quadrant pain: Secondary | ICD-10-CM

## 2014-06-21 DIAGNOSIS — R61 Generalized hyperhidrosis: Secondary | ICD-10-CM | POA: Diagnosis not present

## 2014-06-21 DIAGNOSIS — Z9049 Acquired absence of other specified parts of digestive tract: Secondary | ICD-10-CM | POA: Insufficient documentation

## 2014-06-21 DIAGNOSIS — G8929 Other chronic pain: Secondary | ICD-10-CM | POA: Diagnosis not present

## 2014-06-21 DIAGNOSIS — Z8639 Personal history of other endocrine, nutritional and metabolic disease: Secondary | ICD-10-CM | POA: Diagnosis not present

## 2014-06-21 DIAGNOSIS — R109 Unspecified abdominal pain: Secondary | ICD-10-CM | POA: Diagnosis present

## 2014-06-21 LAB — BASIC METABOLIC PANEL
Anion gap: 7 (ref 5–15)
BUN: <5 mg/dL — ABNORMAL LOW (ref 6–23)
CO2: 27 mmol/L (ref 19–32)
Calcium: 8.6 mg/dL (ref 8.4–10.5)
Chloride: 105 mmol/L (ref 96–112)
Creatinine, Ser: 0.62 mg/dL (ref 0.50–1.10)
GFR calc Af Amer: >90 mL/min (ref >90)
GFR calc non Af Amer: >90 mL/min (ref >90)
Glucose, Bld: 87 mg/dL (ref 70–99)
Potassium: 3.7 mmol/L (ref 3.5–5.1)
Sodium: 139 mmol/L (ref 135–145)

## 2014-06-21 LAB — CBC WITH DIFFERENTIAL/PLATELET
Basophils Absolute: 0 10*3/uL (ref 0.0–0.1)
Basophils Relative: 0 % (ref 0–1)
Eosinophils Absolute: 0.1 10*3/uL (ref 0.0–0.7)
Eosinophils Relative: 1 % (ref 0–5)
HCT: 39.5 % (ref 36.0–46.0)
Hemoglobin: 13.3 g/dL (ref 12.0–15.0)
Lymphocytes Relative: 18 % (ref 12–46)
Lymphs Abs: 1.9 10*3/uL (ref 0.7–4.0)
MCH: 35.4 pg — ABNORMAL HIGH (ref 26.0–34.0)
MCHC: 33.7 g/dL (ref 30.0–36.0)
MCV: 105.1 fL — ABNORMAL HIGH (ref 78.0–100.0)
Monocytes Absolute: 0.5 10*3/uL (ref 0.1–1.0)
Monocytes Relative: 5 % (ref 3–12)
Neutro Abs: 7.8 10*3/uL — ABNORMAL HIGH (ref 1.7–7.7)
Neutrophils Relative %: 76 % (ref 43–77)
Platelets: 329 10*3/uL (ref 150–400)
RBC: 3.76 MIL/uL — ABNORMAL LOW (ref 3.87–5.11)
RDW: 13.3 % (ref 11.5–15.5)
WBC: 10.2 10*3/uL (ref 4.0–10.5)

## 2014-06-21 LAB — URINALYSIS, ROUTINE W REFLEX MICROSCOPIC
Bilirubin Urine: NEGATIVE
Glucose, UA: NEGATIVE mg/dL
Hgb urine dipstick: NEGATIVE
Ketones, ur: NEGATIVE mg/dL
Leukocytes, UA: NEGATIVE
Nitrite: NEGATIVE
Protein, ur: NEGATIVE mg/dL
Specific Gravity, Urine: 1.005 (ref 1.005–1.030)
Urobilinogen, UA: 0.2 mg/dL (ref 0.0–1.0)
pH: 6 (ref 5.0–8.0)

## 2014-06-21 MED ORDER — IOHEXOL 300 MG/ML  SOLN
50.0000 mL | Freq: Once | INTRAMUSCULAR | Status: AC | PRN
  Administered 2014-06-21: 50 mL via ORAL

## 2014-06-21 MED ORDER — SODIUM CHLORIDE 0.9 % IV BOLUS (SEPSIS)
1000.0000 mL | Freq: Once | INTRAVENOUS | Status: AC
  Administered 2014-06-21: 1000 mL via INTRAVENOUS

## 2014-06-21 MED ORDER — ONDANSETRON 4 MG PO TBDP
4.0000 mg | ORAL_TABLET | Freq: Once | ORAL | Status: AC
  Administered 2014-06-21: 4 mg via ORAL
  Filled 2014-06-21: qty 1

## 2014-06-21 MED ORDER — SODIUM CHLORIDE 0.9 % IJ SOLN
INTRAMUSCULAR | Status: AC
  Filled 2014-06-21: qty 30

## 2014-06-21 MED ORDER — OXYCODONE-ACETAMINOPHEN 5-325 MG PO TABS
2.0000 | ORAL_TABLET | Freq: Once | ORAL | Status: AC
  Administered 2014-06-21: 2 via ORAL
  Filled 2014-06-21: qty 2

## 2014-06-21 MED ORDER — IOHEXOL 300 MG/ML  SOLN
100.0000 mL | Freq: Once | INTRAMUSCULAR | Status: AC | PRN
Start: 2014-06-21 — End: 2014-06-21
  Administered 2014-06-21: 100 mL via INTRAVENOUS

## 2014-06-21 MED ORDER — HYDROMORPHONE HCL 1 MG/ML IJ SOLN
1.0000 mg | Freq: Once | INTRAMUSCULAR | Status: AC
  Administered 2014-06-21: 1 mg via INTRAVENOUS
  Filled 2014-06-21: qty 1

## 2014-06-21 NOTE — ED Notes (Signed)
Reports diagnosed 2 weeks ago with colitis. Given po abx and completed round and after completion pain came back per pt.

## 2014-06-21 NOTE — ED Notes (Signed)
Pt called out and reports her rlq pain has intensified after she unbuttoned her pants. Pt restless in bed. Will made Dr. Juleen ChinaKohut aware.

## 2014-06-21 NOTE — ED Notes (Signed)
Pt called and reports increase of abd pain to rlq. Dr. Juleen ChinaKohut aware and new order for pain medication

## 2014-06-21 NOTE — ED Notes (Signed)
Report given to G. Meadows RN 

## 2014-06-21 NOTE — Discharge Instructions (Signed)
Abdominal Pain, Women °Abdominal (stomach, pelvic, or belly) pain can be caused by many things. It is important to tell your doctor: °· The location of the pain. °· Does it come and go or is it present all the time? °· Are there things that start the pain (eating certain foods, exercise)? °· Are there other symptoms associated with the pain (fever, nausea, vomiting, diarrhea)? °All of this is helpful to know when trying to find the cause of the pain. °CAUSES  °· Stomach: virus or bacteria infection, or ulcer. °· Intestine: appendicitis (inflamed appendix), regional ileitis (Crohn's disease), ulcerative colitis (inflamed colon), irritable bowel syndrome, diverticulitis (inflamed diverticulum of the colon), or cancer of the stomach or intestine. °· Gallbladder disease or stones in the gallbladder. °· Kidney disease, kidney stones, or infection. °· Pancreas infection or cancer. °· Fibromyalgia (pain disorder). °· Diseases of the female organs: °¨ Uterus: fibroid (non-cancerous) tumors or infection. °¨ Fallopian tubes: infection or tubal pregnancy. °¨ Ovary: cysts or tumors. °¨ Pelvic adhesions (scar tissue). °¨ Endometriosis (uterus lining tissue growing in the pelvis and on the pelvic organs). °¨ Pelvic congestion syndrome (female organs filling up with blood just before the menstrual period). °¨ Pain with the menstrual period. °¨ Pain with ovulation (producing an egg). °¨ Pain with an IUD (intrauterine device, birth control) in the uterus. °¨ Cancer of the female organs. °· Functional pain (pain not caused by a disease, may improve without treatment). °· Psychological pain. °· Depression. °DIAGNOSIS  °Your doctor will decide the seriousness of your pain by doing an examination. °· Blood tests. °· X-rays. °· Ultrasound. °· CT scan (computed tomography, special type of X-ray). °· MRI (magnetic resonance imaging). °· Cultures, for infection. °· Barium enema (dye inserted in the large intestine, to better view it with  X-rays). °· Colonoscopy (looking in intestine with a lighted tube). °· Laparoscopy (minor surgery, looking in abdomen with a lighted tube). °· Major abdominal exploratory surgery (looking in abdomen with a large incision). °TREATMENT  °The treatment will depend on the cause of the pain.  °· Many cases can be observed and treated at home. °· Over-the-counter medicines recommended by your caregiver. °· Prescription medicine. °· Antibiotics, for infection. °· Birth control pills, for painful periods or for ovulation pain. °· Hormone treatment, for endometriosis. °· Nerve blocking injections. °· Physical therapy. °· Antidepressants. °· Counseling with a psychologist or psychiatrist. °· Minor or major surgery. °HOME CARE INSTRUCTIONS  °· Do not take laxatives, unless directed by your caregiver. °· Take over-the-counter pain medicine only if ordered by your caregiver. Do not take aspirin because it can cause an upset stomach or bleeding. °· Try a clear liquid diet (broth or water) as ordered by your caregiver. Slowly move to a bland diet, as tolerated, if the pain is related to the stomach or intestine. °· Have a thermometer and take your temperature several times a day, and record it. °· Bed rest and sleep, if it helps the pain. °· Avoid sexual intercourse, if it causes pain. °· Avoid stressful situations. °· Keep your follow-up appointments and tests, as your caregiver orders. °· If the pain does not go away with medicine or surgery, you may try: °¨ Acupuncture. °¨ Relaxation exercises (yoga, meditation). °¨ Group therapy. °¨ Counseling. °SEEK MEDICAL CARE IF:  °· You notice certain foods cause stomach pain. °· Your home care treatment is not helping your pain. °· You need stronger pain medicine. °· You want your IUD removed. °· You feel faint or   lightheaded. °· You develop nausea and vomiting. °· You develop a rash. °· You are having side effects or an allergy to your medicine. °SEEK IMMEDIATE MEDICAL CARE IF:  °· Your  pain does not go away or gets worse. °· You have a fever. °· Your pain is felt only in portions of the abdomen. The right side could possibly be appendicitis. The left lower portion of the abdomen could be colitis or diverticulitis. °· You are passing blood in your stools (bright red or black tarry stools, with or without vomiting). °· You have blood in your urine. °· You develop chills, with or without a fever. °· You pass out. °MAKE SURE YOU:  °· Understand these instructions. °· Will watch your condition. °· Will get help right away if you are not doing well or get worse. °Document Released: 12/22/2006 Document Revised: 07/11/2013 Document Reviewed: 01/11/2009 °[employer]® Patient Information ©2015 [employer]. This information is not intended to replace advice given to you by your health care provider. Make sure you discuss any questions you have with your health care provider. ° °

## 2014-06-21 NOTE — ED Provider Notes (Signed)
CSN: [id number]     Arrival date & time 06/21/14  1022 History  This chart was scribed for Angel Razor, MD by Elveria Rising, ED scribe.  This patient was seen in room APA15/APA15 and the patient's care was started at 10:44 AM.   Chief Complaint  Patient presents with  . Abdominal Pain   The history is provided by the patient. No language interpreter was used.   HPI Comments: [name] is a 34 y.o. female who presents to the Emergency Department complaining of returned abdominal pain, onset yesterday. Patient reports noticing incipient tinges of pain days before but reports drastic worsening since yesterday, now describing severe, constant, stabbing pain. Patient reports associated diarrhea and chills. When asked patient recalls vaginal bleeding/spotting a few days ago that has since resolved. Patient reports back pain, consistent with her chronic pain. Per chart history patient evaluated 3/30 for complaints of RLQ abdominal pain; patient diagnosed with colitis and discharged with prescriptions for Cipro, Zofran, Percocet, and Flagyl. Patient returned 3/31 denying relief of her abdominal pain with treatment; patient discharged with Dialudid. Patient's surgical history includes cholecystectomy, tubal ligation, and right abdominal hysterectomy.    Past Medical History  Diagnosis Date  . Hypokalemia   . Narcotic abuse     5 yrs ago lortab, off few yrs  . Anxiety   . Chronic pain   . Endometriosis   . Chronic headache   . Chronic back pain   . Chronic pelvic pain in female   . Renal disorder Cyst on Kidney   Past Surgical History  Procedure Laterality Date  . Tonsillectomy    . Cholecystectomy  2010  . Tubal ligation    . Carpal tunnel release      both  . Abdominal hysterectomy      left ovary remains   Family History  Problem Relation Age of Onset  . Stroke Father   . Diverticulitis Father   . Heart disease    . Arthritis    . Diabetes    . Kidney disease     History   Substance Use Topics  . Smoking status: Current Every Day Smoker -- 1.50 packs/day for 18 years    Types: Cigarettes  . Smokeless tobacco: Never Used  . Alcohol Use: Yes     Comment: rarely   OB History    Gravida Para Term Preterm AB TAB SAB Ectopic Multiple Living   Review of Systems  Constitutional: Positive for diaphoresis. Negative for fever.  HENT: Negative for congestion and sore throat.   Eyes: Negative for pain.  Respiratory: Negative for choking, shortness of breath and wheezing.   Cardiovascular: Negative for chest pain.  Gastrointestinal: Positive for abdominal pain and diarrhea. Negative for vomiting.  Genitourinary: Negative for dysuria.  Musculoskeletal: Positive for back pain. Negative for neck pain.  Skin: Negative for rash.  Allergic/Immunologic: Negative for immunocompromised state.  Neurological: Negative for headaches.  Hematological: Negative for adenopathy.  Psychiatric/Behavioral: Negative for behavioral problems.  All other systems reviewed and are negative.     Allergies  Fentanyl; Divalproex sodium; Fioricet-codeine; Ibuprofen; and Ketorolac tromethamine  Home Medications   Prior to Admission medications   Medication Sig Start Date End Date Taking? Authorizing Provider  ALPRAZolam Prudy Feeler) 0.5 MG tablet Take 0.5 mg by mouth 2 (two) times daily as needed for anxiety.    Historical Provider, MD  ciprofloxacin (CIPRO) 500 MG tablet Take  1 tablet (500 mg total) by mouth every 12 (twelve) hours. 06/07/14   Shon Batonourtney F Horton, MD  diphenhydrAMINE (BENADRYL) 25 MG tablet Take 25 mg by mouth every 6 (six) hours as needed for allergies.    Historical Provider, MD  HYDROmorphone (DILAUDID) 4 MG tablet Take 1 tablet (4 mg total) by mouth every 6 (six) hours as needed for severe pain. 06/08/14   Bethann BerkshireJoseph Zammit, MD  metroNIDAZOLE (FLAGYL) 500 MG tablet Take 1 tablet (500 mg total) by mouth 2 (two) times daily. 06/07/14   Shon Batonourtney F Horton, MD   ondansetron (ZOFRAN ODT) 4 MG disintegrating tablet Take 1 tablet (4 mg total) by mouth every 8 (eight) hours as needed for nausea or vomiting. Patient not taking: Reported on 06/08/2014 06/07/14   Shon Batonourtney F Horton, MD  oxyCODONE-acetaminophen (PERCOCET/ROXICET) 5-325 MG per tablet Take 1-2 tablets by mouth every 6 (six) hours as needed for severe pain. 06/07/14   Shon Batonourtney F Horton, MD  promethazine (PHENERGAN) 25 MG tablet Take 1 tablet (25 mg total) by mouth every 6 (six) hours as needed for nausea or vomiting. Patient not taking: Reported on 06/07/2014 05/11/14   Janne NapoleonHope M Neese, NP   Triage Vitals: BP 115/63 mmHg  Pulse 83  Temp(Src) 98.5 F (36.9 C) (Oral)  Resp 18  SpO2 100% Physical Exam  Constitutional: She is oriented to person, place, and time. She appears well-developed and well-nourished. No distress.  HENT:  Head: Normocephalic and atraumatic.  Eyes: EOM are normal.  Neck: Neck supple. No tracheal deviation present.  Cardiovascular: Normal rate.   Pulmonary/Chest: Effort normal. No respiratory distress.  Abdominal: Soft. There is tenderness. There is no rebound and no guarding.  Mild tenderness RLQ.   Musculoskeletal: Normal range of motion.  Neurological: She is alert and oriented to person, place, and time.  Skin: Skin is warm and dry.  Psychiatric: She has a normal mood and affect. Her behavior is normal.  Nursing note and vitals reviewed.   ED Course  Procedures (including critical care time)  COORDINATION OF CARE: 10:34 AM- Discussed treatment plan with patient at bedside and patient agreed to plan.   Labs Review Labs Reviewed - No data to display  Imaging Review No results found.  Ct Abdomen Pelvis W Contrast  06/21/2014   CLINICAL DATA:  Right-sided abdominal pain radiating to the LEFT side. Onset of symptoms 3 weeks ago. Hysterectomy. Cholecystectomy. Clinical history states problem list including narcotic and cocaine abuse.  EXAM: CT ABDOMEN AND PELVIS WITH  CONTRAST  TECHNIQUE: Multidetector CT imaging of the abdomen and pelvis was performed using the standard protocol following bolus administration of intravenous contrast.  CONTRAST:  50mL OMNIPAQUE IOHEXOL 300 MG/ML SOLN, 100mL OMNIPAQUE IOHEXOL 300 MG/ML SOLN  COMPARISON:  Multiple priors dating back to 01/25/2012.  FINDINGS: Musculoskeletal: No aggressive osseous lesions. Thoracolumbar vertebral body height is preserved. Negative for AVN.  Lung Bases: Atelectasis.  Liver: Heterogeneous attenuation of the liver, most compatible with geographic fatty infiltration. This has been developing over serial exams dating back to 2015. The location adjacent to the falciform ligament of the liver is fairly characteristic. No mass effect or distortion of the vasculature.  Spleen:  Normal.  Gallbladder:  Cholecystectomy.  Common bile duct: Postcholecystectomy dilation of the common bile duct.  Pancreas:  Stable prominence of the pancreatic duct.  Adrenal glands:  Normal bilaterally.  Kidneys: Normal enhancement. Tiny subcentimeter LEFT interpolar renal low-density lesion likely represents a cyst. This is unchanged. LEFT ureter normal. RIGHT ureter normal.  Stomach:  Normal.  Small bowel: Normal appearance of the duodenum. Small bowel is within normal limits. No mesenteric adenopathy. No small bowel thickening.  Colon: Normal appendix. Oral contrast is present throughout the colon. No colonic mass or inflammatory change.  Pelvic Genitourinary: Hysterectomy. Surgical clips in the expected location of the RIGHT ovary. LEFT ovary appears physiologic. Urinary bladder is normal.  Peritoneum: No free fluid.  No free air.  Vasculature: Normal.  Body Wall: Normal.  IMPRESSION: 1. No acute abnormality. 2. Cholecystectomy. 3. Progressive fatty infiltration of the liver, most prominent around the falciform ligament. Findings are compatible with developing geographic hepatosteatosis. Correlation with liver enzymes recommended. 4.  Hysterectomy.   Electronically Signed   By: Andreas Newport M.D.   On: 06/21/2014 15:07    EKG Interpretation None      MDM   Final diagnoses:  RLQ abdominal pain    33yF with RLQ pain. Actually seems more chronic in nature. Pt writhing around on bed though. Ultimately, imaging done. Negative for acute abnormality. Pt with numerous requests for pain medication. Explained that with what seems most consistent with chronic pain and no objective findings of acute pathology that I did not feel comfortable prescribing narcotic pain medications.   I personally preformed the services scribed in my presence. The recorded information has been reviewed is accurate. Angel Razor, MD.    Angel Razor, MD 06/26/14 [phone]

## 2014-06-21 NOTE — ED Notes (Signed)
Discharge instructions reviewed with pt, questions answered. Pt verbalized understanding.  

## 2014-06-27 ENCOUNTER — Ambulatory Visit: Payer: Self-pay | Admitting: Obstetrics & Gynecology

## 2014-06-28 ENCOUNTER — Encounter: Payer: Self-pay | Admitting: Obstetrics and Gynecology

## 2014-06-28 ENCOUNTER — Ambulatory Visit (INDEPENDENT_AMBULATORY_CARE_PROVIDER_SITE_OTHER): Payer: BLUE CROSS/BLUE SHIELD | Admitting: Obstetrics and Gynecology

## 2014-06-28 VITALS — BP 100/62 | Ht 61.0 in | Wt 120.0 lb

## 2014-06-28 DIAGNOSIS — L0231 Cutaneous abscess of buttock: Secondary | ICD-10-CM

## 2014-06-28 DIAGNOSIS — R1032 Left lower quadrant pain: Secondary | ICD-10-CM

## 2014-06-28 MED ORDER — HYDROCODONE-ACETAMINOPHEN 5-325 MG PO TABS: 1.0000 | ORAL_TABLET | Freq: Four times a day (QID) | ORAL | Status: DC | PRN

## 2014-06-28 MED ORDER — DOXYCYCLINE HYCLATE 100 MG PO CAPS: 100.0000 mg | ORAL_CAPSULE | Freq: Two times a day (BID) | ORAL | Status: DC

## 2014-06-28 NOTE — Progress Notes (Signed)
Patient ID: [name], female   DOB: 1980-03-28, 34 y.o.   MRN: [id number] Pt here today for boil near vagina. Pt states that she has had the area for about 3 days and the place just gets bigger and bigger. Pt states that the area is very painful.    Family [facility] Clinic Visit  Patient name: [name] MRN [id number]  Date of birth: Jul 21, 1980  CC & HPI:  [name] is a 34 y.o. female presenting today for left gluteal abscess. The buttock has a firm indurated area 4 cm from anus and lateral to post fourchette.area is 2 cm wide, not ready to drain. I&D is recommended.and accepted  ROS:  No ptiot episodes. Husband has them. Husb is MRSA positive.  Pertinent History Reviewed:   Reviewed: Significant for no prior boils Medical         Past Medical History  Diagnosis Date  . Hypokalemia   . Narcotic abuse     5 yrs ago lortab, off few yrs  . Anxiety   . Chronic pain   . Endometriosis   . Chronic headache   . Chronic back pain   . Chronic pelvic pain in female   . Renal disorder Cyst on Kidney                              Surgical Hx:    Past Surgical History  Procedure Laterality Date  . Tonsillectomy    . Cholecystectomy  2010  . Tubal ligation    . Carpal tunnel release      both  . Abdominal hysterectomy      left ovary remains   Medications: Reviewed & Updated - see associated section                       Current outpatient prescriptions:  .  ALPRAZolam (XANAX) 0.5 MG tablet, Take 0.5 mg by mouth 2 (two) times daily as needed for anxiety., Disp: , Rfl:  .  diphenhydrAMINE (BENADRYL) 25 MG tablet, Take 25 mg by mouth every 6 (six) hours as needed for allergies., Disp: , Rfl:  .  ciprofloxacin (CIPRO) 500 MG tablet, Take 1 tablet (500 mg total) by mouth every 12 (twelve) hours. (Patient not taking: Reported on 06/21/2014), Disp: 14 tablet, Rfl: 0 .  HYDROmorphone (DILAUDID) 4 MG tablet, Take 1 tablet (4 mg total) by mouth every 6 (six) hours as needed for severe pain.  (Patient not taking: Reported on 06/21/2014), Disp: 15 tablet, Rfl: 0 .  oxyCODONE-acetaminophen (PERCOCET/ROXICET) 5-325 MG per tablet, Take 1-2 tablets by mouth every 6 (six) hours as needed for severe pain. (Patient not taking: Reported on 06/28/2014), Disp: 15 tablet, Rfl: 0 .  traMADol (ULTRAM) 50 MG tablet, , Disp: , Rfl: 0 .  [DISCONTINUED] POTASSIUM PO, Take 1 tablet by mouth daily.  , Disp: , Rfl:    Social History: Reviewed -  reports that she has been smoking Cigarettes.  She has a 9 pack-year smoking history. She has never used smokeless tobacco.  Objective Findings:  Vitals: Blood pressure 100/62, height  (1.549 m), weight 120 lb (54.432 kg).  Physical Examination: General appearance - alert, well appearing, and in no distress and ill-appearing Pelvic - normal external genitalia, vulva, vagina, cervix, uterus and adnexa, VULVA: normal appearing vulva with no masses, tenderness or lesions, vulvar nodule left buttock I&D NOTE'   Timeout, then local anesth  then 11 blade used to open abscess of left buttock , removing 5 cc of purulent debris. L-shaped incision used. t also has llq pain , and wants u/s of  Remaining left ovary Assessment & Plan:   A:  1. Left gluteal abscess , prob MRSA 2 chronic llq pain  3 hx ov cysts. P:  1. I&D done 2. Rx doxycycline x 10 d 3 . Left LQ ? ovary pain. S/p tah rso will get u/s to evaluated left ovary

## 2014-06-30 ENCOUNTER — Other Ambulatory Visit: Payer: Self-pay | Admitting: Obstetrics and Gynecology

## 2014-06-30 DIAGNOSIS — R1032 Left lower quadrant pain: Secondary | ICD-10-CM

## 2014-07-04 ENCOUNTER — Other Ambulatory Visit: Payer: BLUE CROSS/BLUE SHIELD

## 2014-07-06 ENCOUNTER — Ambulatory Visit (INDEPENDENT_AMBULATORY_CARE_PROVIDER_SITE_OTHER): Payer: BLUE CROSS/BLUE SHIELD | Admitting: Obstetrics and Gynecology

## 2014-07-06 ENCOUNTER — Encounter: Payer: Self-pay | Admitting: Obstetrics and Gynecology

## 2014-07-06 VITALS — BP 120/82 | Ht 61.0 in | Wt 120.0 lb

## 2014-07-06 DIAGNOSIS — G8929 Other chronic pain: Secondary | ICD-10-CM | POA: Diagnosis not present

## 2014-07-06 DIAGNOSIS — N949 Unspecified condition associated with female genital organs and menstrual cycle: Secondary | ICD-10-CM | POA: Diagnosis not present

## 2014-07-06 DIAGNOSIS — R102 Pelvic and perineal pain: Principal | ICD-10-CM

## 2014-07-06 MED ORDER — HYDROCODONE-ACETAMINOPHEN 10-325 MG PO TABS: 1.0000 | ORAL_TABLET | Freq: Four times a day (QID) | ORAL | Status: DC | PRN

## 2014-07-06 NOTE — Progress Notes (Signed)
Patient ID: [name], female   DOB: 05/09/1980, 34 y.o.   MRN: [id number]  This chart was scribed for Angel Burrow, MD by Carl Best, ED Scribe. This patient was seen in Room 1 and the patient's care was started at 2:31 PM.    [facility] Clinic Visit  Patient name: [name] MRN [id number]  Date of birth: 1981/02/05  CC & HPI:  [name] is a 34 y.o. female s/p abdominal hysterectomy presenting today for constant bilateral ovarian pain.  She rates the pain 8/10 currently.  She states that the pain became severe yesterday and made it difficult for her to sleep.  Only the left ovary remained after her surgery.  She is also complaining of painful boils on her left labia majora.  She has also noticed another area under her right armpit.  She shaves her pubic hair.  ROS:  All systems have been reviewed and are negative unless otherwise indicated in the HPI.   Pertinent History Reviewed:   Reviewed: Significant for tubal ligation, abdominal hysterectomy Medical         Past Medical History  Diagnosis Date  . Hypokalemia   . Narcotic abuse     5 yrs ago lortab, off few yrs  . Anxiety   . Chronic pain   . Endometriosis   . Chronic headache   . Chronic back pain   . Chronic pelvic pain in female   . Renal disorder Cyst on Kidney                              Surgical Hx:    Past Surgical History  Procedure Laterality Date  . Tonsillectomy    . Cholecystectomy  2010  . Tubal ligation    . Carpal tunnel release      both  . Abdominal hysterectomy      left ovary remains   Medications: Reviewed & Updated - see associated section                       Current outpatient prescriptions:  .  ALPRAZolam (XANAX) 0.5 MG tablet, Take 0.5 mg by mouth 2 (two) times daily as needed for anxiety., Disp: , Rfl:  .  diphenhydrAMINE (BENADRYL) 25 MG tablet, Take 25 mg by mouth every 6 (six) hours as needed for allergies., Disp: , Rfl:  .  HYDROcodone-acetaminophen (NORCO/VICODIN) 5-325  MG per tablet, Take 1 tablet by mouth every 6 (six) hours as needed., Disp: 15 tablet, Rfl: 0 .  traMADol (ULTRAM) 50 MG tablet, , Disp: , Rfl: 0 .  oxyCODONE-acetaminophen (PERCOCET/ROXICET) 5-325 MG per tablet, Take 1-2 tablets by mouth every 6 (six) hours as needed for severe pain. (Patient not taking: Reported on 06/28/2014), Disp: 15 tablet, Rfl: 0 .  [DISCONTINUED] POTASSIUM PO, Take 1 tablet by mouth daily.  , Disp: , Rfl:    Social History: Reviewed -  reports that she has been smoking Cigarettes.  She has a 9 pack-year smoking history. She has never used smokeless tobacco.  Objective Findings:  Vitals: Blood pressure 120/82, height  (1.549 m), weight 120 lb (54.432 kg).  Physical Examination: General appearance - alert, well appearing, and in no distress, oriented to person, place, and time Pelvic - normal external genitalia, vulva, vagina, cervix, uterus and adnexa,  VULVA: normal appearing vulva with no masses, tenderness or lesions,  VAGINA: normal appearing vagina with normal  color and discharge, no lesions, well-healed surgical cuff, well supported, extreme tenderness of left edge of the vaginal cuff possibly due to adhesions to left tube or ovary CERVIX: surgically absent UTERUS: surgically absent ADNEXA: only left remains; right surgically absent   Assessment & Plan:   A:  1. Chronic LLQ pain,  2. Chronic noncompliance with u/s and appts  P:  1. Absolute importance of keeping rescheduled appt explained. 2 increase to norco10 x 30 tabs 3. U/s in a.m. And discuss laparoscopic left salpingoophorectomy

## 2014-07-06 NOTE — Progress Notes (Signed)
Patient ID: [name], female   DOB: Jul 24, 1980, 34 y.o.   MRN: [id number] Pt here today for ovarian pain. Pt states that she is having pain on both sides. Pt states that she has had the pain really bad since yesterday. Pt states that she didn't sleep at all last night because of the pain. Pt rates her pain an 8 on a scale from 0-10.

## 2014-07-07 ENCOUNTER — Other Ambulatory Visit: Payer: Self-pay | Admitting: Obstetrics and Gynecology

## 2014-07-07 ENCOUNTER — Other Ambulatory Visit: Payer: Self-pay | Admitting: *Deleted

## 2014-07-07 ENCOUNTER — Ambulatory Visit (INDEPENDENT_AMBULATORY_CARE_PROVIDER_SITE_OTHER): Payer: BLUE CROSS/BLUE SHIELD

## 2014-07-07 DIAGNOSIS — R102 Pelvic and perineal pain: Secondary | ICD-10-CM

## 2014-07-07 DIAGNOSIS — R1032 Left lower quadrant pain: Secondary | ICD-10-CM

## 2014-07-07 NOTE — Progress Notes (Addendum)
US T/A & TV pelvis: s/p hysterectomy and rt oophorectomy, normal vag cuff,rt adnexa wnl,normal (mobile) lt ov,some discomfort on left during ultrasound.

## 2014-07-11 ENCOUNTER — Telehealth: Payer: Self-pay | Admitting: Obstetrics and Gynecology

## 2014-07-11 ENCOUNTER — Ambulatory Visit (INDEPENDENT_AMBULATORY_CARE_PROVIDER_SITE_OTHER): Payer: BLUE CROSS/BLUE SHIELD | Admitting: Obstetrics and Gynecology

## 2014-07-11 ENCOUNTER — Encounter: Payer: Self-pay | Admitting: Obstetrics and Gynecology

## 2014-07-11 VITALS — BP 110/60 | Ht 61.0 in | Wt 121.0 lb

## 2014-07-11 DIAGNOSIS — N764 Abscess of vulva: Secondary | ICD-10-CM

## 2014-07-11 MED ORDER — SULFAMETHOXAZOLE-TRIMETHOPRIM 800-160 MG PO TABS: 1.0000 | ORAL_TABLET | Freq: Two times a day (BID) | ORAL | Status: DC

## 2014-07-11 MED ORDER — OXYCODONE-ACETAMINOPHEN 5-325 MG PO TABS: 1.0000 | ORAL_TABLET | Freq: Four times a day (QID) | ORAL | Status: DC | PRN

## 2014-07-11 NOTE — Progress Notes (Signed)
Patient ID: [name], female   DOB: [birth year], 34 y.o.   MRN: [id number] Pt here today for pre op visit. Pt states that she has another boil on her bottom, it opened some this morning but she would for Dr. Emelda FearFerguson to check it and see if he needs to lance it.    [facility]  Patient name: Angel RheaCheri Garvey MRN [id number]  Date of birth: [birth year]  CC & HPI:  Angel RheaCheri Zidek is a 34 y.o. female presenting today for vulvar abscess left labium. Began to drain, but looks to be still swelling according to pt.  ROS:  Vulvar follicles.  Pertinent History Reviewed:   Reviewed: Significant for  Medical         Past Medical History  Diagnosis Date  . Hypokalemia   . Narcotic abuse     5 yrs ago lortab, off few yrs  . Anxiety   . Chronic pain   . Endometriosis   . Chronic headache   . Chronic back pain   . Chronic pelvic pain in female   . Renal disorder Cyst on Kidney                              Surgical Hx:    Past Surgical History  Procedure Laterality Date  . Tonsillectomy    . Cholecystectomy  2010  . Tubal ligation    . Carpal tunnel release      both  . Abdominal hysterectomy      left ovary remains   Medications: Reviewed & Updated - see associated section                       Current outpatient prescriptions:  .  ALPRAZolam (XANAX) 0.5 MG tablet, Take 0.5 mg by mouth 2 (two) times daily as needed for anxiety., Disp: , Rfl:  .  diphenhydrAMINE (BENADRYL) 25 MG tablet, Take 25 mg by mouth every 6 (six) hours as needed for allergies., Disp: , Rfl:  .  HYDROcodone-acetaminophen (NORCO) 10-325 MG per tablet, Take 1 tablet by mouth every 6 (six) hours as needed. (Patient taking differently: Take 1 tablet by mouth every 6 (six) hours as needed for moderate pain. ), Disp: 30 tablet, Rfl: 0 .  HYDROcodone-acetaminophen (NORCO/VICODIN) 5-325 MG per tablet, Take 1 tablet by mouth every 6 (six) hours as needed. (Patient not taking: Reported on 07/10/2014), Disp: 15 tablet, Rfl:  0 .  oxyCODONE-acetaminophen (PERCOCET/ROXICET) 5-325 MG per tablet, Take 1-2 tablets by mouth every 6 (six) hours as needed for severe pain. (Patient not taking: Reported on 06/28/2014), Disp: 15 tablet, Rfl: 0 .  [DISCONTINUED] POTASSIUM PO, Take 1 tablet by mouth daily.  , Disp: , Rfl:    Social History: Reviewed -  reports that she has been smoking Cigarettes.  She has a 9 pack-year smoking history. She has never used smokeless tobacco.  Objective Findings:  Vitals: Blood pressure 110/60, height 5\' 1"  (1.549 m), weight 121 lb (54.885 kg).  Physical Examination: Pelvic - normal external genitalia, vulva, vagina, cervix, uterus and adnexa, VULVA: vulvar lesion 2 cm induraiton in left Labim at mid vag entrance level.  I&D done under local anesthesia.   Assessment & Plan:   A:  1. Labial abscess  P:  1. Rx Septra DS bid 2. Local heat  3. Reassess 3 days.

## 2014-07-11 NOTE — Patient Instructions (Signed)
Randalyn RheaCheri Rottman  07/11/2014   Your procedure is scheduled on:  07/18/2014  [facility] at [facility] AM.  Call this number if you have problems the morning of surgery: [phone]   Remember:   Do not eat food or drink liquids after midnight.   Take these medicines the morning of surgery with A SIP OF WATER: Xanax, Hydrocodone or Oxycodone   Do not wear jewelry, make-up or nail polish.  Do not wear lotions, powders, or perfumes. You may wear deodorant.  Do not shave 48 hours prior to surgery. Men may shave face and neck.  Do not bring valuables to the hospital.  [facility] is not responsible for any belongings or valuables.               Contacts, dentures or bridgework may not be worn into surgery.  Leave suitcase in the car. After surgery it may be brought to your room.  For patients admitted to the hospital, discharge time is determined by your treatment team.               Patients discharged the day of surgery will not be allowed to drive home.  Name and phone number of your driver:   Special Instructions: Shower using CHG 2 nights before surgery and the night before surgery.  If you shower the day of surgery use CHG.  Use special wash - you have one bottle of CHG for all showers.  You should use approximately 1/3 of the bottle for each shower.   Please read over the following fact sheets that you were given: Surgical Site Infection Prevention and Anesthesia Post-op Instructions   PATIENT INSTRUCTIONS POST-ANESTHESIA  IMMEDIATELY FOLLOWING SURGERY:  Do not drive or operate machinery for the first twenty four hours after surgery.  Do not make any important decisions for twenty four hours after surgery or while taking narcotic pain medications or sedatives.  If you develop intractable nausea and vomiting or a severe headache please notify your doctor immediately.  FOLLOW-UP:  Please make an appointment with your surgeon as instructed. You do not need to follow up with anesthesia  unless specifically instructed to do so.  WOUND CARE INSTRUCTIONS (if applicable):  Keep a dry clean dressing on the anesthesia/puncture wound site if there is drainage.  Once the wound has quit draining you may leave it open to air.  Generally you should leave the bandage intact for twenty four hours unless there is drainage.  If the epidural site drains for more than 36-48 hours please call the anesthesia department.  QUESTIONS?:  Please feel free to call your physician or the hospital operator if you have any questions, and they will be happy to assist you.

## 2014-07-12 ENCOUNTER — Ambulatory Visit (INDEPENDENT_AMBULATORY_CARE_PROVIDER_SITE_OTHER): Payer: BLUE CROSS/BLUE SHIELD | Admitting: Obstetrics and Gynecology

## 2014-07-12 ENCOUNTER — Encounter (HOSPITAL_COMMUNITY): Payer: Self-pay

## 2014-07-12 ENCOUNTER — Encounter: Payer: Self-pay | Admitting: Obstetrics and Gynecology

## 2014-07-12 ENCOUNTER — Other Ambulatory Visit: Payer: Self-pay

## 2014-07-12 ENCOUNTER — Encounter (HOSPITAL_COMMUNITY)
Admission: RE | Admit: 2014-07-12 | Discharge: 2014-07-12 | Disposition: A | Discharge status: Home / Self Care | Payer: BLUE CROSS/BLUE SHIELD | Source: Ambulatory Visit | Attending: Obstetrics and Gynecology | Admitting: Obstetrics and Gynecology

## 2014-07-12 ENCOUNTER — Other Ambulatory Visit: Payer: Self-pay | Admitting: Obstetrics and Gynecology

## 2014-07-12 VITALS — BP 120/70 | Ht 61.0 in | Wt 121.0 lb

## 2014-07-12 DIAGNOSIS — N764 Abscess of vulva: Secondary | ICD-10-CM

## 2014-07-12 HISTORY — DX: Cardiac murmur, unspecified: R01.1

## 2014-07-12 LAB — BASIC METABOLIC PANEL
Anion gap: 9 (ref 5–15)
BUN: 6 mg/dL (ref 6–20)
CO2: 29 mmol/L (ref 22–32)
CREATININE: 0.68 mg/dL (ref 0.44–1.00)
Calcium: 8.4 mg/dL — ABNORMAL LOW (ref 8.9–10.3)
Chloride: 100 mmol/L — ABNORMAL LOW (ref 101–111)
GFR calc non Af Amer: >60 mL/min (ref >60)
Glucose, Bld: 73 mg/dL (ref 70–99)
Potassium: 3.7 mmol/L (ref 3.5–5.1)
Sodium: 138 mmol/L (ref 135–145)

## 2014-07-12 LAB — URINALYSIS, ROUTINE W REFLEX MICROSCOPIC
BILIRUBIN URINE: NEGATIVE
Glucose, UA: NEGATIVE mg/dL
KETONES UR: NEGATIVE mg/dL
Leukocytes, UA: NEGATIVE
Nitrite: NEGATIVE
Protein, ur: NEGATIVE mg/dL
Specific Gravity, Urine: 1.02 (ref 1.005–1.030)
UROBILINOGEN UA: 1 mg/dL (ref 0.0–1.0)
pH: 6 (ref 5.0–8.0)

## 2014-07-12 LAB — CBC
HEMATOCRIT: 41.4 % (ref 36.0–46.0)
Hemoglobin: 13.9 g/dL (ref 12.0–15.0)
MCH: 35.6 pg — AB (ref 26.0–34.0)
MCHC: 33.6 g/dL (ref 30.0–36.0)
MCV: 106.2 fL — ABNORMAL HIGH (ref 78.0–100.0)
Platelets: 243 10*3/uL (ref 150–400)
RBC: 3.9 MIL/uL (ref 3.87–5.11)
RDW: 13.2 % (ref 11.5–15.5)
WBC: 21.9 10*3/uL — ABNORMAL HIGH (ref 4.0–10.5)

## 2014-07-12 LAB — URINE MICROSCOPIC-ADD ON

## 2014-07-12 MED ORDER — HYDROMORPHONE HCL 2 MG PO TABS: 2.0000 mg | ORAL_TABLET | ORAL | Status: DC | PRN

## 2014-07-12 MED ORDER — METRONIDAZOLE 500 MG PO TABS: 500.0000 mg | ORAL_TABLET | Freq: Two times a day (BID) | ORAL | Status: DC

## 2014-07-12 NOTE — Progress Notes (Signed)
Patient ID: [name], female   DOB: 12-21-1980, 34 y.o.   MRN: [id number] Pt here today for swelling in boil that was lanced yesterday. Pt states that she has swelling all the way up into her hair line.

## 2014-07-12 NOTE — Progress Notes (Addendum)
Patient ID: Angel RheaCheri Bihm, female   DOB: [birth year], 34 y.o.   MRN: [id number]   [facility]  Patient name: Angel RheaCheri Stierwalt MRN [id number]  Date of birth: [birth year]  CC & HPI:  [name] is a [age] y.o. female presenting today for follow-up of a vulvar abscess that was I&D'ed by me yesterday. Patient notes swelling, severe pain, and purulent drainage to the area since. She has had associated sleep disturbances from the pain, despite taking Percocet for pain relief. She was discharged home with Bactrim, which patient states she has taken. Patient states she also applied Neosporin and applied an ice pack to the area.   ROS:  A complete 10 system review of systems was obtained and all systems are negative except as noted in the HPI and PMH.    Pertinent History Reviewed:   Reviewed: Significant for tubal ligation and abdominal hysterectomy  Medical         Past Medical History  Diagnosis Date  . Hypokalemia   . Narcotic abuse     5 yrs ago lortab, off few yrs  . Anxiety   . Chronic pain   . Endometriosis   . Chronic headache   . Chronic back pain   . Chronic pelvic pain in female   . 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 Heart murmur                               Surgical Hx:    Past Surgical History  Procedure Laterality Date  . Tonsillectomy    . Cholecystectomy  2010  . Tubal ligation    . Carpal tunnel release      both  . Abdominal hysterectomy      left ovary remains   Medications: Reviewed & Updated - see associated section                       Current outpatient prescriptions:  .  ALPRAZolam (XANAX) 0.5 MG tablet, Take 0.5 mg by mouth 2 (two) times daily as needed for anxiety., Disp: , Rfl:  .  diphenhydrAMINE (BENADRYL) 25 MG tablet, Take 25 mg by mouth every 6 (six) hours as needed for allergies., Disp: , Rfl:  .  HYDROcodone-acetaminophen (NORCO) 10-325 MG per tablet, Take 1 tablet by mouth every 6 (six) hours as needed. (Patient taking differently: Take 1 tablet  by mouth every 6 (six) hours as needed for moderate pain. ), Disp: 30 tablet, Rfl: 0 .  HYDROcodone-acetaminophen (NORCO/VICODIN) 5-325 MG per tablet, Take 1 tablet by mouth every 6 (six) hours as needed. (Patient not taking: Reported on 07/10/2014), Disp: 15 tablet, Rfl: 0 .  oxyCODONE-acetaminophen (PERCOCET/ROXICET) 5-325 MG per tablet, Take 1-2 tablets by mouth every 6 (six) hours as needed for severe pain., Disp: 15 tablet, Rfl: 0 .  sulfamethoxazole-trimethoprim (BACTRIM DS,SEPTRA DS) 800-160 MG per tablet, Take 1 tablet by mouth 2 (two) times daily., Disp: 20 tablet, Rfl: 1 .  [DISCONTINUED] POTASSIUM PO, Take 1 tablet by mouth daily.  , Disp: , Rfl:    Social History: Reviewed -  reports that she has been smoking Cigarettes.  She has a 9 pack-year smoking history. She has never used smokeless tobacco.  Objective Findings:  Vitals: Blood pressure 120/70, height 5\' 1"  (1.549 m), weight 121 lb (54.885 kg).  Physical Examination: General appearance - alert, well appearing, and in no distress, oriented to  person, place, and time and normal appearing weight Mental status - alert, oriented to person, place, and time, normal mood, behavior, speech, dress, motor activity, and thought processes; patient is tearful and distressed secondary to the pain Pelvic - VULVA:  area of induration noted with associated tenderness. Not any worse than yesterday. Pt with inguinal tenderness from developing lymphadenopathy I am able to probe the defect with sterile hemostat: theres no loculation down to 1.5 cm.   Assessment & Plan:   A:  1. Slow recovery from left labia majora abscess 2. Inadequate pain control  P:  1. Continue to take Bactrim. Add flagyl 2. Increase pain medication. Change to Dilaudid 3. F/u in 5 days. May need to delay the planned laparoscopy 4. Pt to call office Friday with pain control report.   This chart was SCRIBED for Christin BachJohn Trystin Terhune, MD by Ronney LionSuzanne Le, ED Scribe. This patient was  seen in room 1 and the patient's care was started at 3:57 PM.  I personally performed the services described in this documentation, which was SCRIBED in my presence. The recorded information has been reviewed and considered accurate. It has been edited as necessary during review. Tilda BurrowFERGUSON,Lya Holben V, MD

## 2014-07-13 ENCOUNTER — Other Ambulatory Visit (HOSPITAL_COMMUNITY): Payer: Self-pay | Admitting: Orthopedic Surgery

## 2014-07-13 DIAGNOSIS — M545 Low back pain: Secondary | ICD-10-CM

## 2014-07-13 NOTE — Telephone Encounter (Signed)
Seen in office.

## 2014-07-14 ENCOUNTER — Other Ambulatory Visit: Payer: Self-pay | Admitting: Obstetrics and Gynecology

## 2014-07-14 ENCOUNTER — Encounter (HOSPITAL_COMMUNITY): Payer: Self-pay | Admitting: *Deleted

## 2014-07-14 ENCOUNTER — Encounter (HOSPITAL_COMMUNITY)
Admission: RE | Disposition: A | Discharge status: Home / Self Care | Payer: Self-pay | Source: Ambulatory Visit | Attending: Obstetrics and Gynecology

## 2014-07-14 ENCOUNTER — Ambulatory Visit (HOSPITAL_COMMUNITY): Payer: BLUE CROSS/BLUE SHIELD | Admitting: Anesthesiology

## 2014-07-14 ENCOUNTER — Ambulatory Visit (INDEPENDENT_AMBULATORY_CARE_PROVIDER_SITE_OTHER): Payer: BLUE CROSS/BLUE SHIELD | Admitting: Obstetrics and Gynecology

## 2014-07-14 ENCOUNTER — Inpatient Hospital Stay (HOSPITAL_COMMUNITY)
Admission: RE | Admit: 2014-07-14 | Discharge: 2014-07-16 | DRG: 747 | Disposition: A | Discharge status: Home / Self Care | Payer: BLUE CROSS/BLUE SHIELD | Source: Ambulatory Visit | Attending: Obstetrics and Gynecology | Admitting: Obstetrics and Gynecology

## 2014-07-14 ENCOUNTER — Encounter: Payer: Self-pay | Admitting: Obstetrics and Gynecology

## 2014-07-14 ENCOUNTER — Encounter: Payer: BLUE CROSS/BLUE SHIELD | Admitting: Obstetrics and Gynecology

## 2014-07-14 VITALS — BP 120/74 | Ht 61.0 in | Wt 118.0 lb

## 2014-07-14 DIAGNOSIS — N764 Abscess of vulva: Secondary | ICD-10-CM | POA: Insufficient documentation

## 2014-07-14 DIAGNOSIS — F17210 Nicotine dependence, cigarettes, uncomplicated: Secondary | ICD-10-CM | POA: Diagnosis present

## 2014-07-14 DIAGNOSIS — R1032 Left lower quadrant pain: Secondary | ICD-10-CM | POA: Diagnosis present

## 2014-07-14 HISTORY — PX: INCISION AND DRAINAGE ABSCESS: SHX5864

## 2014-07-14 HISTORY — DX: Pneumonia, unspecified organism: J18.9

## 2014-07-14 LAB — CBC WITH DIFFERENTIAL/PLATELET
Basophils Absolute: 0 10*3/uL (ref 0.0–0.1)
Basophils Relative: 0 % (ref 0–1)
EOS PCT: 1 % (ref 0–5)
Eosinophils Absolute: 0.1 10*3/uL (ref 0.0–0.7)
HEMATOCRIT: 41.7 % (ref 36.0–46.0)
Hemoglobin: 14 g/dL (ref 12.0–15.0)
LYMPHS PCT: 11 % — AB (ref 12–46)
Lymphs Abs: 1.7 10*3/uL (ref 0.7–4.0)
MCH: 35.4 pg — ABNORMAL HIGH (ref 26.0–34.0)
MCHC: 33.6 g/dL (ref 30.0–36.0)
MCV: 105.6 fL — ABNORMAL HIGH (ref 78.0–100.0)
MONO ABS: 1.2 10*3/uL — AB (ref 0.1–1.0)
Monocytes Relative: 8 % (ref 3–12)
Neutro Abs: 12.5 10*3/uL — ABNORMAL HIGH (ref 1.7–7.7)
Neutrophils Relative %: 80 % — ABNORMAL HIGH (ref 43–77)
Platelets: 260 10*3/uL (ref 150–400)
RBC: 3.95 MIL/uL (ref 3.87–5.11)
RDW: 13.2 % (ref 11.5–15.5)
WBC: 15.5 10*3/uL — ABNORMAL HIGH (ref 4.0–10.5)

## 2014-07-14 SURGERY — INCISION AND DRAINAGE, ABSCESS
Anesthesia: General

## 2014-07-14 MED ORDER — DIPHENHYDRAMINE HCL 25 MG PO CAPS
25.0000 mg | ORAL_CAPSULE | Freq: Four times a day (QID) | ORAL | Status: DC | PRN
  Administered 2014-07-15 (×2): 25 mg via ORAL
  Filled 2014-07-14 (×2): qty 1

## 2014-07-14 MED ORDER — SUFENTANIL CITRATE 50 MCG/ML IV SOLN
INTRAVENOUS | Status: DC | PRN
  Administered 2014-07-14: 10 ug via INTRAVENOUS
  Administered 2014-07-14 (×2): 5 ug via INTRAVENOUS

## 2014-07-14 MED ORDER — ONDANSETRON HCL 4 MG/2ML IJ SOLN
4.0000 mg | Freq: Once | INTRAMUSCULAR | Status: AC
  Administered 2014-07-14: 4 mg via INTRAVENOUS

## 2014-07-14 MED ORDER — BACITRACIN-NEOMYCIN-POLYMYXIN 400-5-5000 EX OINT
TOPICAL_OINTMENT | CUTANEOUS | Status: AC
  Filled 2014-07-14: qty 1

## 2014-07-14 MED ORDER — KETOROLAC TROMETHAMINE 30 MG/ML IJ SOLN
30.0000 mg | Freq: Once | INTRAMUSCULAR | Status: DC
  Filled 2014-07-14: qty 1

## 2014-07-14 MED ORDER — ENOXAPARIN SODIUM 40 MG/0.4ML ~~LOC~~ SOLN
40.0000 mg | SUBCUTANEOUS | Status: DC
  Administered 2014-07-15: 40 mg via SUBCUTANEOUS
  Filled 2014-07-14: qty 0.4

## 2014-07-14 MED ORDER — BACITRACIN-NEOMYCIN-POLYMYXIN 400-5-5000 EX OINT
TOPICAL_OINTMENT | CUTANEOUS | Status: DC | PRN
  Administered 2014-07-14: 1 via TOPICAL

## 2014-07-14 MED ORDER — DEXAMETHASONE SODIUM PHOSPHATE 4 MG/ML IJ SOLN
4.0000 mg | Freq: Once | INTRAMUSCULAR | Status: AC
  Administered 2014-07-14: 4 mg via INTRAVENOUS

## 2014-07-14 MED ORDER — FENTANYL CITRATE (PF) 100 MCG/2ML IJ SOLN
INTRAMUSCULAR | Status: AC
  Filled 2014-07-14: qty 2

## 2014-07-14 MED ORDER — NICOTINE 21 MG/24HR TD PT24
21.0000 mg | MEDICATED_PATCH | Freq: Every day | TRANSDERMAL | Status: DC
  Administered 2014-07-14 – 2014-07-16 (×3): 21 mg via TRANSDERMAL
  Filled 2014-07-14 (×3): qty 1

## 2014-07-14 MED ORDER — DEXAMETHASONE SODIUM PHOSPHATE 4 MG/ML IJ SOLN
INTRAMUSCULAR | Status: AC
  Filled 2014-07-14: qty 1

## 2014-07-14 MED ORDER — HYDROMORPHONE HCL 1 MG/ML IJ SOLN
0.2500 mg | INTRAMUSCULAR | Status: AC
  Administered 2014-07-14 (×4): 0.25 mg via INTRAVENOUS

## 2014-07-14 MED ORDER — SODIUM CHLORIDE 0.9 % IV SOLN: INTRAVENOUS | Status: DC

## 2014-07-14 MED ORDER — HYDROMORPHONE HCL 1 MG/ML IJ SOLN
INTRAMUSCULAR | Status: AC
  Filled 2014-07-14: qty 1

## 2014-07-14 MED ORDER — MIDAZOLAM HCL 2 MG/2ML IJ SOLN: 2.0000 mg | INTRAMUSCULAR | Status: DC

## 2014-07-14 MED ORDER — CEFOTETAN DISODIUM-DEXTROSE 2-2.08 GM-% IV SOLR
INTRAVENOUS | Status: AC
  Filled 2014-07-14: qty 50

## 2014-07-14 MED ORDER — IBUPROFEN 600 MG PO TABS: 600.0000 mg | ORAL_TABLET | Freq: Four times a day (QID) | ORAL | Status: DC | PRN

## 2014-07-14 MED ORDER — BUPIVACAINE LIPOSOME 1.3 % IJ SUSP
INTRAMUSCULAR | Status: AC
  Filled 2014-07-14: qty 20

## 2014-07-14 MED ORDER — SODIUM CHLORIDE 0.9 % IV SOLN
INTRAVENOUS | Status: DC
  Administered 2014-07-14 – 2014-07-15 (×3): via INTRAVENOUS

## 2014-07-14 MED ORDER — LIDOCAINE HCL 1 % IJ SOLN
INTRAMUSCULAR | Status: DC | PRN
  Administered 2014-07-14: 30 mg via INTRADERMAL

## 2014-07-14 MED ORDER — OXYCODONE-ACETAMINOPHEN 5-325 MG PO TABS
1.0000 | ORAL_TABLET | ORAL | Status: DC | PRN
  Administered 2014-07-14 – 2014-07-16 (×9): 2 via ORAL
  Filled 2014-07-14 (×9): qty 2

## 2014-07-14 MED ORDER — MIDAZOLAM HCL 2 MG/2ML IJ SOLN
INTRAMUSCULAR | Status: AC
  Filled 2014-07-14: qty 2

## 2014-07-14 MED ORDER — CEFOTETAN DISODIUM-DEXTROSE 2-2.08 GM-% IV SOLR
2.0000 g | INTRAVENOUS | Status: AC
  Administered 2014-07-14: 2 g via INTRAVENOUS

## 2014-07-14 MED ORDER — HYDROMORPHONE HCL 1 MG/ML IJ SOLN
0.2500 mg | INTRAMUSCULAR | Status: AC | PRN
  Administered 2014-07-14 (×4): 0.25 mg via INTRAVENOUS

## 2014-07-14 MED ORDER — 0.9 % SODIUM CHLORIDE (POUR BTL) OPTIME
TOPICAL | Status: DC | PRN
  Administered 2014-07-14: 1000 mL

## 2014-07-14 MED ORDER — HYDROMORPHONE HCL 1 MG/ML IJ SOLN
1.0000 mg | INTRAMUSCULAR | Status: DC | PRN
  Administered 2014-07-14 – 2014-07-15 (×2): 1 mg via INTRAVENOUS
  Filled 2014-07-14 (×2): qty 1

## 2014-07-14 MED ORDER — ONDANSETRON HCL 4 MG PO TABS: 4.0000 mg | ORAL_TABLET | Freq: Four times a day (QID) | ORAL | Status: DC | PRN

## 2014-07-14 MED ORDER — DIPHENHYDRAMINE HCL 50 MG/ML IJ SOLN
INTRAMUSCULAR | Status: AC
  Filled 2014-07-14: qty 1

## 2014-07-14 MED ORDER — ONDANSETRON HCL 4 MG/2ML IJ SOLN
INTRAMUSCULAR | Status: AC
  Filled 2014-07-14: qty 2

## 2014-07-14 MED ORDER — PROPOFOL 10 MG/ML IV BOLUS
INTRAVENOUS | Status: DC | PRN
  Administered 2014-07-14: 150 mg via INTRAVENOUS

## 2014-07-14 MED ORDER — HYDROMORPHONE HCL 2 MG PO TABS
2.0000 mg | ORAL_TABLET | ORAL | Status: DC | PRN
  Administered 2014-07-14: 2 mg via ORAL
  Filled 2014-07-14: qty 1

## 2014-07-14 MED ORDER — ONDANSETRON HCL 4 MG/2ML IJ SOLN: 4.0000 mg | Freq: Once | INTRAMUSCULAR | Status: DC | PRN

## 2014-07-14 MED ORDER — SUFENTANIL CITRATE 50 MCG/ML IV SOLN
INTRAVENOUS | Status: AC
  Filled 2014-07-14: qty 1

## 2014-07-14 MED ORDER — LIDOCAINE HCL (PF) 1 % IJ SOLN
INTRAMUSCULAR | Status: AC
  Filled 2014-07-14: qty 5

## 2014-07-14 MED ORDER — MIDAZOLAM HCL 2 MG/2ML IJ SOLN
1.0000 mg | INTRAMUSCULAR | Status: DC | PRN
  Administered 2014-07-14 (×5): 2 mg via INTRAVENOUS

## 2014-07-14 MED ORDER — PROPOFOL 10 MG/ML IV BOLUS
INTRAVENOUS | Status: AC
  Filled 2014-07-14: qty 20

## 2014-07-14 MED ORDER — HYDROMORPHONE HCL 2 MG PO TABS: 2.0000 mg | ORAL_TABLET | ORAL | Status: DC | PRN

## 2014-07-14 MED ORDER — ONDANSETRON HCL 4 MG/2ML IJ SOLN: 4.0000 mg | Freq: Four times a day (QID) | INTRAMUSCULAR | Status: DC | PRN

## 2014-07-14 MED ORDER — HYDROMORPHONE HCL 1 MG/ML IJ SOLN
0.2500 mg | INTRAMUSCULAR | Status: DC | PRN
  Filled 2014-07-14: qty 1

## 2014-07-14 MED ORDER — PANTOPRAZOLE SODIUM 40 MG PO TBEC
40.0000 mg | DELAYED_RELEASE_TABLET | Freq: Every day | ORAL | Status: DC
Start: 2014-07-15 — End: 2014-07-16
  Administered 2014-07-15 – 2014-07-16 (×2): 40 mg via ORAL
  Filled 2014-07-14 (×2): qty 1

## 2014-07-14 MED ORDER — DIPHENHYDRAMINE HCL 50 MG/ML IJ SOLN
25.0000 mg | Freq: Once | INTRAMUSCULAR | Status: AC
  Administered 2014-07-14: 25 mg via INTRAVENOUS

## 2014-07-14 MED ORDER — MIDAZOLAM HCL 5 MG/5ML IJ SOLN
INTRAMUSCULAR | Status: DC | PRN
  Administered 2014-07-14: 2 mg via INTRAVENOUS

## 2014-07-14 MED ORDER — ALPRAZOLAM 0.5 MG PO TABS
0.5000 mg | ORAL_TABLET | Freq: Two times a day (BID) | ORAL | Status: DC | PRN
  Administered 2014-07-14 – 2014-07-16 (×4): 0.5 mg via ORAL
  Filled 2014-07-14 (×4): qty 1

## 2014-07-14 MED ORDER — BUPIVACAINE LIPOSOME 1.3 % IJ SUSP
INTRAMUSCULAR | Status: DC | PRN
  Administered 2014-07-14: 3 mL

## 2014-07-14 MED ORDER — DOXYCYCLINE HYCLATE 100 MG IV SOLR
100.0000 mg | Freq: Two times a day (BID) | INTRAVENOUS | Status: DC
  Administered 2014-07-14 – 2014-07-16 (×4): 100 mg via INTRAVENOUS
  Filled 2014-07-14 (×6): qty 100

## 2014-07-14 MED ORDER — LACTATED RINGERS IV SOLN
INTRAVENOUS | Status: DC
  Administered 2014-07-14: 11:00:00 via INTRAVENOUS

## 2014-07-14 SURGICAL SUPPLY — 25 items
BAG HAMPER (MISCELLANEOUS) ×3 IMPLANT
CLOTH BEACON ORANGE TIMEOUT ST (SAFETY) ×3 IMPLANT
COVER LIGHT HANDLE STERIS (MISCELLANEOUS) ×6 IMPLANT
ELECT REM PT RETURN 9FT ADLT (ELECTROSURGICAL) ×3
ELECTRODE REM PT RTRN 9FT ADLT (ELECTROSURGICAL) ×1 IMPLANT
GAUZE IODOFORM PACK 1/2 7832 (GAUZE/BANDAGES/DRESSINGS) ×2 IMPLANT
GLOVE BIOGEL PI IND STRL 7.0 (GLOVE) IMPLANT
GLOVE BIOGEL PI IND STRL 9 (GLOVE) ×1 IMPLANT
GLOVE BIOGEL PI INDICATOR 7.0 (GLOVE) ×2
GLOVE BIOGEL PI INDICATOR 9 (GLOVE) ×2
GLOVE ECLIPSE 9.0 STRL (GLOVE) ×3 IMPLANT
GLOVE INDICATOR 7.5 STRL GRN (GLOVE) ×2 IMPLANT
GLOVE SURG SS PI 7.5 STRL IVOR (GLOVE) ×2 IMPLANT
GOWN SPEC L3 XXLG W/TWL (GOWN DISPOSABLE) ×4 IMPLANT
GOWN STRL REUS W/TWL LRG LVL3 (GOWN DISPOSABLE) ×3 IMPLANT
KIT ROOM TURNOVER AP CYSTO (KITS) ×3 IMPLANT
MANIFOLD NEPTUNE II (INSTRUMENTS) ×3 IMPLANT
NDL HYPO 18GX1.5 BLUNT FILL (NEEDLE) IMPLANT
NEEDLE HYPO 18GX1.5 BLUNT FILL (NEEDLE) ×3 IMPLANT
NS IRRIG 1000ML POUR BTL (IV SOLUTION) ×3 IMPLANT
PACK PERI GYN (CUSTOM PROCEDURE TRAY) ×3 IMPLANT
PAD ABD 5X9 TENDERSORB (GAUZE/BANDAGES/DRESSINGS) ×2 IMPLANT
PAD ARMBOARD 7.5X6 YLW CONV (MISCELLANEOUS) ×3 IMPLANT
SET BASIN LINEN APH (SET/KITS/TRAYS/PACK) ×3 IMPLANT
SYRINGE 20CC LL (MISCELLANEOUS) ×2 IMPLANT

## 2014-07-14 NOTE — OR Nursing (Signed)
Up to bathroom to void 

## 2014-07-14 NOTE — OR Nursing (Signed)
Cbc with diff ordered,  Per lab can not add to existing blood drawn on the 4th.  Tech drew cbc with diff took 4 attempts. Specimen sent to lab

## 2014-07-14 NOTE — Progress Notes (Signed)
Patient ID: Angel RheaCheri Woods, female   DOB: Aug 03, 1980, 34 y.o.   MRN: [id number] Pt worked in today for severe pain and swelling. Pt states that everything is much worse.

## 2014-07-14 NOTE — OR Nursing (Signed)
Patient continues to complain of pain,  Rates at a level 9,  Dr. Jayme CloudGonzalez notified and orders given

## 2014-07-14 NOTE — Brief Op Note (Signed)
07/14/2014  2:30 PM  PATIENT:  [name]  34 y.o. female  PRE-OPERATIVE DIAGNOSIS:  vulvar abscess left labia majora  POST-OPERATIVE DIAGNOSIS:  vulvar abscess, left labia majora  PROCEDURE:  Procedure(s): INCISION AND DRAINAGE VULVAR ABSCESS (N/A)  SURGEON:  Surgeon(s) and Role:    * Tilda BurrowJohn Smantha Boakye V, MD - Primary  PHYSICIAN ASSISTANT:   ASSISTANTS: [employer], CST, Risk managerDallas RN circulator   ANESTHESIA:   local and general  EBL:  Total I/O In: 700 [I.V.:700] Out: -   BLOOD ADMINISTERED:none  DRAINS: Iodoform gauze drain through bed of abscess tract   LOCAL MEDICATIONS USED:  OTHER Exparel 5 cc  SPECIMEN:  No Specimen  DISPOSITION OF SPECIMEN:  N/A  COUNTS:  YES  TOURNIQUET:  * No tourniquets in log *  DICTATION: .Dragon Dictation  PLAN OF CARE: Admit to inpatient   PATIENT DISPOSITION:  PACU - hemodynamically stable.   Delay start of Pharmacological VTE agent (>24hrs) due to surgical blood loss or risk of bleeding: not applicable Details of procedure: Patient was taken operating room prepped and draped for vulvar procedure. Timeout was conducted. Antibiotics administered. Palpation of the left labia majora revealed a indurated 4 cm wide by 8 cm long induration extending from the pubic ramus inferior to the inferior aspects of the left labia majora.. The previously draining site in the midportion could be probed with a hemostat and identified the deeper abscess tract which did not reach down to the bony structures. An it did not extend to the anus. Using the tip of the hemostat as a guide a 2 cm incision was made medial to the inguinal crease to superior aspect of the abscess and, and 1 cm opening made at the inferior aspects of the abscess. The S was probed and  4 by 4 passed through the bed of the abscess debriding, as it passed through the 2 openings. There is no surrounding fluctuance. Iodoform gauze was passed through, 2 passages ending from the superior to the inferior  abscess openings and left in place again massaged and no further purulence or debris being expelled. The there was no malodor. Patient will be admitted.

## 2014-07-14 NOTE — H&P (Signed)
Status: Signed       Expand All Collapse All   Patient ID: Angel RheaCheri Mesch, female DOB: 1980-09-24, 34 y.o. MRN: [id number]  [facility]  Patient name: Angel RileyCheri BrayMRN [id number] Date of birth: 1980-09-24  CC & HPI:  [name] is a 34 y.o. female presenting today for follow-up for a vulvar abscess THAT was draining earlier this week, was explored under local in the office, but has worsened in pain while on SEPTRA.Marland Kitchen. She is a work-in appointment today, as she is having constant, worsening severe pain and swelling to the area. She has eaten nothing today, due to loss of appetite. She was able to eat yesterday; last meal was at 11:30 PM last night. Patient did have a soda about 1 hour ago. Patient has been on Septra.   ROS:  A complete 10 system review of systems was obtained and all systems are negative except as noted in the HPI and PMH. Still having LLQ pain related to left ovary   Pertinent History Reviewed:  Reviewed: Significant for abdominal hysterectomy and tubal ligation Chronic llq pain, with normal size ovary on u/s Medical  Past Medical History  Diagnosis Date  . Hypokalemia   . Narcotic abuse     5 yrs ago lortab, off few yrs  . Anxiety   . Chronic pain   . Endometriosis   . Chronic headache   . Chronic back pain   . Chronic pelvic pain in female   . 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 Heart murmur     Surgical Hx:  Past Surgical History  Procedure Laterality Date  . Tonsillectomy    . Cholecystectomy  2010  . Tubal ligation    . Carpal tunnel release      both  . Abdominal hysterectomy      left ovary remains   Medications: Reviewed & Updated - see associated section   Current outpatient prescriptions:  . ALPRAZolam (XANAX) 0.5 MG tablet, Take 0.5 mg by mouth 2 (two) times daily as needed for anxiety.,  Disp: , Rfl:  . diphenhydrAMINE (BENADRYL) 25 MG tablet, Take 25 mg by mouth every 6 (six) hours as needed for allergies., Disp: , Rfl:  . HYDROcodone-acetaminophen (NORCO/VICODIN) 5-325 MG per tablet, Take 1 tablet by mouth every 6 (six) hours as needed. (Patient not taking: Reported on 07/10/2014), Disp: 15 tablet, Rfl: 0 . HYDROmorphone (DILAUDID) 2 MG tablet, Take 1 tablet (2 mg total) by mouth every 4 (four) hours as needed for severe pain., Disp: 30 tablet, Rfl: 0 . metroNIDAZOLE (FLAGYL) 500 MG tablet, Take 1 tablet (500 mg total) by mouth 2 (two) times daily., Disp: 14 tablet, Rfl: 0 . oxyCODONE-acetaminophen (PERCOCET/ROXICET) 5-325 MG per tablet, Take 1-2 tablets by mouth every 6 (six) hours as needed for severe pain., Disp: 15 tablet, Rfl: 0 . sulfamethoxazole-trimethoprim (BACTRIM DS,SEPTRA DS) 800-160 MG per tablet, Take 1 tablet by mouth 2 (two) times daily., Disp: 20 tablet, Rfl: 1 . [DISCONTINUED] POTASSIUM PO, Take 1 tablet by mouth daily. , Disp: , Rfl:    Social History: Reviewed -  reports that she has been smoking Cigarettes. She has a 9 pack-year smoking history. She has never used smokeless tobacco.  Objective Findings:  Vitals: Blood pressure 120/74, height 5\' 1"  (1.549 m), weight 118 lb (53.524 kg).  Physical Examination: General appearance - in mild to moderate distress. Physical Examination: General appearance - alert, well appearing, and in no distress, oriented to person, place, and  time and ill-appearing Eyes - pupils equal and reactive, extraocular eye movements intact Abdomen - soft, nontender, nondistended, no masses or organomegaly Pelvic - VULVA: vulvar tendernessancd swelling over Left labium majora parallel to vagina entrance, 3 cm wide x 6 cm saggital swelling and erythema. No drainage from central area where prior 1 cm Opening made, vulvar edema left labia minora, VAGINA: normal appearing vagina with normal color and discharge, no  lesions Extremities - peripheral pulses normal, no pedal edema, no clubbing or cyanosis, inguinal creases no swelling but tender over left inguinal nodes     Assessment & Plan:   A: Persistent vulvar abscess 1. Septra has not been effective in alleviating or controlling patient's vulvar abscess. There islikely a pocket of necrotic tissue.  P:  1. Transfer to OR for surgery. For INCISION AND DRAINAGE OF VULVAR ABSCESS.

## 2014-07-14 NOTE — Anesthesia Preprocedure Evaluation (Addendum)
Anesthesia Evaluation  Patient identified by MRN, date of birth, ID band Patient awake    Reviewed: Allergy & Precautions, NPO status , Patient's Chart, lab work & pertinent test results  Airway Mallampati: I  TM Distance: >3 FB     Dental  (+) Poor Dentition, Dental Advisory Given, Chipped   Pulmonary Current Smoker,  breath sounds clear to auscultation        Cardiovascular negative cardio ROS  Rhythm:Regular Rate:Normal     Neuro/Psych  Headaches, PSYCHIATRIC DISORDERS Anxiety    GI/Hepatic negative GI ROS,   Endo/Other    Renal/GU      Musculoskeletal   Abdominal   Peds  Hematology   Anesthesia Other Findings Hx chronic narcotic use for back pain. Fentanyl IV causes pruritis.  Reproductive/Obstetrics                            Anesthesia Physical Anesthesia Plan  ASA: II  Anesthesia Plan: General   Post-op Pain Management:    Induction: Intravenous  Airway Management Planned: LMA  Additional Equipment:   Intra-op Plan:   Post-operative Plan: Extubation in OR  Informed Consent: I have reviewed the patients History and Physical, chart, labs and discussed the procedure including the risks, benefits and alternatives for the proposed anesthesia with the patient or authorized representative who has indicated his/her understanding and acceptance.     Plan Discussed with:   Anesthesia Plan Comments: (Sufenta intraop due to fentanyl allergy.)       Anesthesia Quick Evaluation

## 2014-07-14 NOTE — Progress Notes (Signed)
Patient ID: Angel RheaCheri Mcnellis, female   DOB: Jul 17, 1980, 34 y.o.   MRN: [id number]   [facility]  Patient name: [name] MRN [id number]  Date of birth: Jul 17, 1980  CC & HPI:  [name] is a 34 y.o. female presenting today for follow-up for a vulvar abscess THAT was draining earlier this week, was explored under local in the office, but has worsened in pain while on SEPTRA.Marland Kitchen. She is a work-in appointment today, as she is having constant, worsening severe pain and swelling to the area. She has eaten nothing today, due to loss of appetite. She was able to eat yesterday; last meal was at 11:30 PM last night. Patient did have a soda about 1 hour ago. Patient has been on Septra.   ROS:  A complete 10 system review of systems was obtained and all systems are negative except as noted in the HPI and PMH. Still having LLQ pain related to left ovary   Pertinent History Reviewed:   Reviewed: Significant for abdominal hysterectomy and tubal ligation Chronic llq pain, with normal size ovary on u/s Medical         Past Medical History  Diagnosis Date  . Hypokalemia   . Narcotic abuse     5 yrs ago lortab, off few yrs  . Anxiety   . Chronic pain   . Endometriosis   . Chronic headache   . Chronic back pain   . Chronic pelvic pain in female   . 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 Heart murmur                               Surgical Hx:    Past Surgical History  Procedure Laterality Date  . Tonsillectomy    . Cholecystectomy  2010  . Tubal ligation    . Carpal tunnel release      both  . Abdominal hysterectomy      left ovary remains   Medications: Reviewed & Updated - see associated section                       Current outpatient prescriptions:  .  ALPRAZolam (XANAX) 0.5 MG tablet, Take 0.5 mg by mouth 2 (two) times daily as needed for anxiety., Disp: , Rfl:  .  diphenhydrAMINE (BENADRYL) 25 MG tablet, Take 25 mg by mouth every 6 (six) hours as needed for allergies., Disp: , Rfl:  .   HYDROcodone-acetaminophen (NORCO/VICODIN) 5-325 MG per tablet, Take 1 tablet by mouth every 6 (six) hours as needed. (Patient not taking: Reported on 07/10/2014), Disp: 15 tablet, Rfl: 0 .  HYDROmorphone (DILAUDID) 2 MG tablet, Take 1 tablet (2 mg total) by mouth every 4 (four) hours as needed for severe pain., Disp: 30 tablet, Rfl: 0 .  metroNIDAZOLE (FLAGYL) 500 MG tablet, Take 1 tablet (500 mg total) by mouth 2 (two) times daily., Disp: 14 tablet, Rfl: 0 .  oxyCODONE-acetaminophen (PERCOCET/ROXICET) 5-325 MG per tablet, Take 1-2 tablets by mouth every 6 (six) hours as needed for severe pain., Disp: 15 tablet, Rfl: 0 .  sulfamethoxazole-trimethoprim (BACTRIM DS,SEPTRA DS) 800-160 MG per tablet, Take 1 tablet by mouth 2 (two) times daily., Disp: 20 tablet, Rfl: 1 .  [DISCONTINUED] POTASSIUM PO, Take 1 tablet by mouth daily.  , Disp: , Rfl:    Social History: Reviewed -  reports that she has been smoking Cigarettes.  She  has a 9 pack-year smoking history. She has never used smokeless tobacco.  Objective Findings:  Vitals: Blood pressure 120/74, height 5\' 1"  (1.549 m), weight 118 lb (53.524 kg).  Physical Examination: General appearance - in mild to moderate distress. Physical Examination: General appearance - alert, well appearing, and in no distress, oriented to person, place, and time and ill-appearing Eyes - pupils equal and reactive, extraocular eye movements intact Abdomen - soft, nontender, nondistended, no masses or organomegaly Pelvic - VULVA: vulvar tendernessancd swelling over  Left labium majora parallel to vagina entrance, 3 cm wide x 6 cm saggital swelling and erythema. No drainage from central area where prior 1 cm Opening made, vulvar edema left labia minora, VAGINA: normal appearing vagina with normal color and discharge, no lesions Extremities - peripheral pulses normal, no pedal edema, no clubbing or cyanosis, inguinal creases no swelling but tender over left inguinal  nodes     Assessment & Plan:   A: Persistent vulvar abscess 1. Septra has not been effective in alleviating or controlling  patient's vulvar abscess. There islikely a pocket of necrotic tissue.  P:  1. Transfer to OR for surgery. For INCISION AND DRAINAGE OF VULVAR ABSCESS.    This chart was SCRIBED for Christin BachJohn Kealii Thueson, MD by Ronney LionSuzanne Le, ED Scribe. This patient was seen in room 2 and the patient's care was started at 9:31 AM.  I personally performed the services described in this documentation, which was SCRIBED in my presence. The recorded information has been reviewed and considered accurate. It has been edited as necessary during review. Tilda BurrowFERGUSON,Blayden Conwell V, MD

## 2014-07-14 NOTE — OR Nursing (Signed)
Complaints of  swollen area  At  IV  Area,   Noted a raised area.   Dr. Jayme CloudGonzalez  Notified  ,  Orders  Given.  See  Mar

## 2014-07-14 NOTE — Anesthesia Procedure Notes (Signed)
Procedure Name: LMA Insertion Date/Time: 07/14/2014 1:53 PM Performed by: Despina HiddenIDACAVAGE, Armida Vickroy J Pre-anesthesia Checklist: Patient identified, Emergency Drugs available, Suction available and Patient being monitored Patient Re-evaluated:Patient Re-evaluated prior to inductionOxygen Delivery Method: Circle system utilized Preoxygenation: Pre-oxygenation with 100% oxygen Intubation Type: IV induction Ventilation: Mask ventilation without difficulty LMA: LMA inserted LMA Size: 3.0 Grade View: Grade I Number of attempts: 1 Placement Confirmation: breath sounds checked- equal and bilateral and positive ETCO2 Tube secured with: Tape Dental Injury: Teeth and Oropharynx as per pre-operative assessment

## 2014-07-14 NOTE — Op Note (Signed)
Please see the brief operative note for details of procedure

## 2014-07-14 NOTE — Transfer of Care (Signed)
Immediate Anesthesia Transfer of Care Note  Patient: [name]  Procedure(s) Performed: Procedure(s): INCISION AND DRAINAGE VULVAR ABSCESS (N/A)  Patient Location: PACU  Anesthesia Type:General  Level of Consciousness: awake and patient cooperative  Airway & Oxygen Therapy: Patient Spontanous Breathing and Patient connected to face mask oxygen  Post-op Assessment: Report given to RN, Post -op Vital signs reviewed and stable and Patient moving all extremities  Post vital signs: Reviewed and stable  Last Vitals:  Filed Vitals:   07/14/14 1340  BP: 94/50  Pulse:   Temp:   Resp: 17    Complications: No apparent anesthesia complications

## 2014-07-14 NOTE — Progress Notes (Signed)
Patient given xanax for being anxious medication effective. Ambulated to bathroom small amount blood noted on ABD pain. Patient voiding without difficulty.

## 2014-07-14 NOTE — Progress Notes (Signed)
Patient tearful and upset states she doesn't know why she can't go outside and smoke. Educated patient on smoking policy. Remains upset. Notified MD order obtain for nicotine patch. Patient upset regarding pain medications being po.

## 2014-07-15 LAB — CBC
HEMATOCRIT: 39.6 % (ref 36.0–46.0)
Hemoglobin: 13.2 g/dL (ref 12.0–15.0)
MCH: 36 pg — AB (ref 26.0–34.0)
MCHC: 33.3 g/dL (ref 30.0–36.0)
MCV: 107.9 fL — ABNORMAL HIGH (ref 78.0–100.0)
Platelets: 282 10*3/uL (ref 150–400)
RBC: 3.67 MIL/uL — ABNORMAL LOW (ref 3.87–5.11)
RDW: 13.1 % (ref 11.5–15.5)
WBC: 15.4 10*3/uL — ABNORMAL HIGH (ref 4.0–10.5)

## 2014-07-15 LAB — BASIC METABOLIC PANEL
ANION GAP: 8 (ref 5–15)
BUN: <5 mg/dL — ABNORMAL LOW (ref 6–20)
CALCIUM: 8.9 mg/dL (ref 8.9–10.3)
CHLORIDE: 107 mmol/L (ref 101–111)
CO2: 24 mmol/L (ref 22–32)
Creatinine, Ser: 0.53 mg/dL (ref 0.44–1.00)
GFR calc Af Amer: >60 mL/min (ref >60)
GFR calc non Af Amer: >60 mL/min (ref >60)
GLUCOSE: 118 mg/dL — AB (ref 70–99)
Potassium: 4.4 mmol/L (ref 3.5–5.1)
Sodium: 139 mmol/L (ref 135–145)

## 2014-07-15 MED ORDER — VANCOMYCIN HCL 1000 MG IV SOLR
INTRAVENOUS | Status: AC
  Filled 2014-07-15: qty 1000

## 2014-07-15 MED ORDER — VANCOMYCIN HCL IN DEXTROSE 1-5 GM/200ML-% IV SOLN
1000.0000 mg | Freq: Two times a day (BID) | INTRAVENOUS | Status: DC
  Administered 2014-07-15: 1000 mg via INTRAVENOUS
  Filled 2014-07-15 (×5): qty 200

## 2014-07-15 MED ORDER — HYDROMORPHONE HCL 1 MG/ML IJ SOLN
2.0000 mg | INTRAMUSCULAR | Status: DC | PRN
  Administered 2014-07-15 – 2014-07-16 (×8): 2 mg via INTRAVENOUS
  Filled 2014-07-15 (×9): qty 2

## 2014-07-15 MED ORDER — CLINDAMYCIN PHOSPHATE 900 MG/50ML IV SOLN
900.0000 mg | Freq: Three times a day (TID) | INTRAVENOUS | Status: DC
  Administered 2014-07-15 – 2014-07-16 (×3): 900 mg via INTRAVENOUS
  Filled 2014-07-15 (×7): qty 50

## 2014-07-15 MED ORDER — CLINDAMYCIN PHOSPHATE 900 MG/50ML IV SOLN
INTRAVENOUS | Status: AC
  Filled 2014-07-15: qty 150

## 2014-07-15 NOTE — Progress Notes (Signed)
Patient requesting Dilaudid, Xanax, and benedryl at one time.  Patient was educated on medications and side effects.  Patient requested for nurse to push Dilaudid fast but was re-educated on side effects and risks.  Will continue to monitor patient.

## 2014-07-15 NOTE — Progress Notes (Signed)
Patient's BP 85/40, manual.  Patient is asymptomatic.  MD made aware.  Will continue to monitor patient.

## 2014-07-15 NOTE — Anesthesia Postprocedure Evaluation (Signed)
  Anesthesia Post-op Note  Patient: [name]  Procedure(s) Performed: Procedure(s): INCISION AND DRAINAGE VULVAR ABSCESS (N/A)  Patient Location: room 329  Anesthesia Type:General  Level of Consciousness: awake, alert , oriented and patient cooperative  Airway and Oxygen Therapy: Patient Spontanous Breathing  Post-op Pain: 4 /10, moderate  Post-op Assessment: Post-op Vital signs reviewed, Patient's Cardiovascular Status Stable, Respiratory Function Stable, Patent Airway, No signs of Nausea or vomiting, Adequate PO intake and Pain level controlled  Post-op Vital Signs: Reviewed and stable  Last Vitals:  Filed Vitals:   07/15/14 0712  BP: 92/43  Pulse: 69  Temp: 36.7 C  Resp:     Complications: No apparent anesthesia complications

## 2014-07-15 NOTE — Progress Notes (Signed)
1 Day Post-Op Procedure(s) (LRB): INCISION AND DRAINAGE VULVAR ABSCESS (N/A)  Subjective: Patient reports incisional pain.  Thru the night she required increasing Dilaudid to 2 mg q 3h, to achieve desired comfort.    Objective: I have reviewed patient's vital signs, medications, labs and there's no suggestion of sepsis. Vacomycin was added due to pt complaints of pain expanding in to inner thigh. Will watch for evidence of expainsion, keeping Necrotizing fasciitis in mind.Marland Kitchen.  GI: soft, non-tender; bowel sounds normal; no masses,  no organomegaly Vulva actually softer, and no extension in to adjacent tissue or down leg. 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  LOS: 1 day    [name] V 07/15/2014, 6:40 AM

## 2014-07-16 LAB — CBC WITH DIFFERENTIAL/PLATELET
BASOS PCT: 0 % (ref 0–1)
Basophils Absolute: 0 10*3/uL (ref 0.0–0.1)
Eosinophils Absolute: 0.1 10*3/uL (ref 0.0–0.7)
Eosinophils Relative: 2 % (ref 0–5)
HEMATOCRIT: 34.2 % — AB (ref 36.0–46.0)
HEMOGLOBIN: 11.1 g/dL — AB (ref 12.0–15.0)
LYMPHS PCT: 49 % — AB (ref 12–46)
Lymphs Abs: 4.3 10*3/uL — ABNORMAL HIGH (ref 0.7–4.0)
MCH: 34.9 pg — ABNORMAL HIGH (ref 26.0–34.0)
MCHC: 32.5 g/dL (ref 30.0–36.0)
MCV: 107.5 fL — ABNORMAL HIGH (ref 78.0–100.0)
Monocytes Absolute: 0.7 10*3/uL (ref 0.1–1.0)
Monocytes Relative: 8 % (ref 3–12)
NEUTROS ABS: 3.7 10*3/uL (ref 1.7–7.7)
Neutrophils Relative %: 41 % — ABNORMAL LOW (ref 43–77)
PLATELETS: 268 10*3/uL (ref 150–400)
RBC: 3.18 MIL/uL — AB (ref 3.87–5.11)
RDW: 13.2 % (ref 11.5–15.5)
WBC: 8.9 10*3/uL (ref 4.0–10.5)

## 2014-07-16 MED ORDER — AMOXICILLIN-POT CLAVULANATE 500-125 MG PO TABS: 1.0000 | ORAL_TABLET | Freq: Three times a day (TID) | ORAL | Status: DC

## 2014-07-16 MED ORDER — HYDROCODONE-ACETAMINOPHEN 10-325 MG PO TABS: 1.0000 | ORAL_TABLET | Freq: Four times a day (QID) | ORAL | Status: DC | PRN

## 2014-07-16 NOTE — Discharge Summary (Signed)
Physician Discharge Summary  Patient ID: [name] MRN: [id number] DOB/AGE: Sep 21, 1980 34 y.o.  Admit date: 07/14/2014 Discharge date: 07/16/2014  Admission Diagnoses: vulvar abscess, left labia majora  Discharge Diagnoses:  Active Problems:   Vulvar abscess left labia majora   Discharged Condition: fair  Hospital Course: This 34 year old female was admitted for incision and drainage of vulvar abscess that had been noted to be draining and when seen in the office earlier this week but had closed over and began to reaccumulate she was admitted through the day surgery area after incision and drainage and placement of a drain  Consults: None  Significant Diagnostic Studies: labs: cbc CBC    Component Value Date/Time   WBC 8.9 07/16/2014 0616   RBC 3.18* 07/16/2014 0616   HGB 11.1* 07/16/2014 0616   HCT 34.2* 07/16/2014 0616   PLT 268 07/16/2014 0616   MCV 107.5* 07/16/2014 0616   MCH 34.9* 07/16/2014 0616   MCHC 32.5 07/16/2014 0616   RDW 13.2 07/16/2014 0616   LYMPHSABS 4.3* 07/16/2014 0616   MONOABS 0.7 07/16/2014 0616   EOSABS 0.1 07/16/2014 0616   BASOSABS 0.0 07/16/2014 0616      Treatments: antibiotics:  and surgery: Incision and drainage of vulvar abscess left labia majora  Discharge Exam: Blood pressure 100/50, pulse 56, temperature 97.9 F (36.6 C), temperature source Oral, resp. rate 20, height 5\' 1"  (1.549 m), weight 126 lb 8 oz (57.38 kg), SpO2 100 %. General appearance: alert, cooperative and mild distress Pelvic: no cervical motion tenderness and vagina normal without discharge The JP drain has been removed, the tissue of the left labia majora is less indurated is no adenopathy. Patient was able to void she is moderately sore due to residual swelling but there is no suspicion of loculation Disposition: 01-Home or Self Care  Discharge Instructions    Diet - low sodium heart healthy    Complete by:  As directed      Increase activity slowly    Complete by:   As directed             Medication List    STOP taking these medications        ALPRAZolam 0.5 MG tablet  Commonly known as:  XANAX     HYDROmorphone 2 MG tablet  Commonly known as:  DILAUDID     metroNIDAZOLE 500 MG tablet  Commonly known as:  FLAGYL     oxyCODONE-acetaminophen 5-325 MG per tablet  Commonly known as:  PERCOCET/ROXICET      TAKE these medications        amoxicillin-clavulanate 500-125 MG per tablet  Commonly known as:  AUGMENTIN  Take 1 tablet (500 mg total) by mouth 3 (three) times daily.     diphenhydrAMINE 25 MG tablet  Commonly known as:  BENADRYL  Take 25 mg by mouth every 6 (six) hours as needed for allergies.     HYDROcodone-acetaminophen 10-325 MG per tablet  Commonly known as:  NORCO  Take 1 tablet by mouth every 6 (six) hours as needed.     methocarbamol 500 MG tablet  Commonly known as:  ROBAXIN  Take 500 mg by mouth every 4 (four) hours as needed. FOR MUSCLE SPASMS     sulfamethoxazole-trimethoprim 800-160 MG per tablet  Commonly known as:  BACTRIM DS,SEPTRA DS  Take 1 tablet by mouth 2 (two) times daily.         SignedTilda Burrow: [name] 07/16/2014, 12:44 PM

## 2014-07-16 NOTE — Progress Notes (Signed)
Discharge instruction reviewed with patient. No distress noted. Escorted to lobby via wheelchair.

## 2014-07-16 NOTE — Discharge Instructions (Signed)
Irrigate the inflamed area 2 x daily Topical neosporin will keep the drainage sites open longer .

## 2014-07-17 ENCOUNTER — Encounter (HOSPITAL_COMMUNITY): Payer: Self-pay | Admitting: Obstetrics and Gynecology

## 2014-07-17 ENCOUNTER — Ambulatory Visit: Payer: BLUE CROSS/BLUE SHIELD | Admitting: Obstetrics and Gynecology

## 2014-07-17 ENCOUNTER — Telehealth: Payer: Self-pay | Admitting: *Deleted

## 2014-07-17 ENCOUNTER — Ambulatory Visit (INDEPENDENT_AMBULATORY_CARE_PROVIDER_SITE_OTHER): Payer: BLUE CROSS/BLUE SHIELD | Admitting: Obstetrics and Gynecology

## 2014-07-17 VITALS — BP 130/90 | Wt 122.6 lb

## 2014-07-17 DIAGNOSIS — Z9889 Other specified postprocedural states: Secondary | ICD-10-CM

## 2014-07-17 DIAGNOSIS — R112 Nausea with vomiting, unspecified: Secondary | ICD-10-CM

## 2014-07-17 MED ORDER — PROMETHAZINE HCL 25 MG PO TABS: 25.0000 mg | ORAL_TABLET | Freq: Four times a day (QID) | ORAL | Status: DC | PRN

## 2014-07-17 NOTE — Progress Notes (Signed)
Patient ID: Angel RheaCheri Kleiner, female   DOB: 01-13-1981, 34 y.o.   MRN: [id number]  Subjective:     [name] is a 34 y.o. female who presents to the clinic 3 days status post incision and drainage for vulvar abscess. She reports continued pain. Pt has tried warm compresses, Hydrocodone-10 mg and Aleve with no relief. She is requesting Percocet-7.5 mg. Pt reports prior opoid addiction.  Diet:       regular without difficulty. Bowel function is: normal. Pain:     Pain is controlled with current analgesics. Medications being used: Hydrocodone-10mg .   Review of Systems Pertinent items are noted in HPI.    Objective:    BP 130/90 mmHg  Wt 122 lb 9.6 oz (55.611 kg) General:  alert and cooperative  Abdomen: soft, bowel sounds active, non-tender  Incision:   healing well, no drainage, no erythema, no hernia, no seroma, swelling around incision site, no dehiscence, incision well approximated          Assessment:    Doing well postoperatively.  Swelling around the incision site. Operative findings again reviewed. Pathology report discussed.    Plan:    1. Continue any current medications. Will prescribe 3 days of Phenergan 25 oral  Alternate hot and cold compresses. 2. Wound care discussed. 3. Activity restrictions: none 4. Anticipated return to work: now. 5. Follow up: 2 weeks.    This chart was scribed for Tilda BurrowJohn Patte Winkel V, MD by Gwenyth Oberatherine Macek, ED Scribe. This patient was seen in room 2 and the patient's care was started at 3:49 PM.   I personally performed the services described in this documentation, which was SCRIBED in my presence. The recorded information has been reviewed and considered accurate. It has been edited as necessary during review. Tilda BurrowFERGUSON,Seleta Hovland V, MD

## 2014-07-17 NOTE — Telephone Encounter (Signed)
Pt worked back into Dr. Emelda FearFerguson schedule for today.

## 2014-07-18 ENCOUNTER — Encounter (HOSPITAL_COMMUNITY): Admission: RE | Payer: Self-pay | Source: Ambulatory Visit

## 2014-07-18 ENCOUNTER — Inpatient Hospital Stay (HOSPITAL_COMMUNITY)
Admission: RE | Admit: 2014-07-18 | Payer: BLUE CROSS/BLUE SHIELD | Source: Ambulatory Visit | Admitting: Obstetrics and Gynecology

## 2014-07-18 ENCOUNTER — Ambulatory Visit: Payer: BLUE CROSS/BLUE SHIELD | Admitting: Obstetrics and Gynecology

## 2014-07-18 SURGERY — SALPINGO-OOPHORECTOMY, LAPAROSCOPIC
Anesthesia: General | Laterality: Left

## 2014-07-19 ENCOUNTER — Telehealth: Payer: Self-pay | Admitting: Obstetrics and Gynecology

## 2014-07-20 ENCOUNTER — Telehealth: Payer: Self-pay | Admitting: Obstetrics and Gynecology

## 2014-07-20 ENCOUNTER — Other Ambulatory Visit: Payer: Self-pay | Admitting: Obstetrics and Gynecology

## 2014-07-20 MED ORDER — HYDROCODONE-ACETAMINOPHEN 5-325 MG PO TABS: 1.0000 | ORAL_TABLET | Freq: Four times a day (QID) | ORAL | Status: DC | PRN

## 2014-07-20 NOTE — Telephone Encounter (Signed)
Pt comes to office on 5/12, requesting HC be rewritten for 5/325's , alleging that she does not need the 10/325's but a half pill "doesn't work". Rx written, with the proviso that pt will be expected to bring pill bottles to next appt for accounting. Pt returns before lunch indicating that Pharmacies([facility]) will not fill rx.  [facility] and [facility]. [facility], but [facility] when d/c'd from hospital. Pt advised to continue to cut her HC 10 in half for now, and then when 5/15 arrives, she can get her HC5 filled. With that Rx , she will have enough to last til her followup after June 1 , and I have advised her that NO refills will be given without exam and evidence of ongoing problem in my absence. Pt appears sedated at time of interaction.Pt has a hx of opiate dependence in past, for which she has been managed by dr Gerilyn Pilgrimoonquah.

## 2014-07-20 NOTE — Telephone Encounter (Signed)
Rx reduced norco 10/325 to Norco 5/325 as per pt request

## 2014-07-24 ENCOUNTER — Ambulatory Visit (HOSPITAL_COMMUNITY): Payer: BLUE CROSS/BLUE SHIELD

## 2014-07-25 ENCOUNTER — Encounter: Payer: BLUE CROSS/BLUE SHIELD | Admitting: Obstetrics and Gynecology

## 2014-08-07 ENCOUNTER — Emergency Department (HOSPITAL_COMMUNITY): Payer: BLUE CROSS/BLUE SHIELD

## 2014-08-07 ENCOUNTER — Emergency Department (HOSPITAL_COMMUNITY)
Admission: EM | Admit: 2014-08-07 | Discharge: 2014-08-07 | Disposition: A | Discharge status: Home / Self Care | Payer: BLUE CROSS/BLUE SHIELD | Attending: Emergency Medicine | Admitting: Emergency Medicine

## 2014-08-07 ENCOUNTER — Encounter (HOSPITAL_COMMUNITY): Payer: Self-pay | Admitting: Emergency Medicine

## 2014-08-07 DIAGNOSIS — G8929 Other chronic pain: Secondary | ICD-10-CM | POA: Diagnosis not present

## 2014-08-07 DIAGNOSIS — N760 Acute vaginitis: Secondary | ICD-10-CM | POA: Insufficient documentation

## 2014-08-07 DIAGNOSIS — Z720 Tobacco use: Secondary | ICD-10-CM | POA: Insufficient documentation

## 2014-08-07 DIAGNOSIS — Z8639 Personal history of other endocrine, nutritional and metabolic disease: Secondary | ICD-10-CM | POA: Diagnosis not present

## 2014-08-07 DIAGNOSIS — R011 Cardiac murmur, unspecified: Secondary | ICD-10-CM | POA: Diagnosis not present

## 2014-08-07 DIAGNOSIS — Z90710 Acquired absence of both cervix and uterus: Secondary | ICD-10-CM | POA: Diagnosis not present

## 2014-08-07 DIAGNOSIS — Z8701 Personal history of pneumonia (recurrent): Secondary | ICD-10-CM | POA: Diagnosis not present

## 2014-08-07 DIAGNOSIS — Z9851 Tubal ligation status: Secondary | ICD-10-CM | POA: Diagnosis not present

## 2014-08-07 DIAGNOSIS — B9689 Other specified bacterial agents as the cause of diseases classified elsewhere: Secondary | ICD-10-CM

## 2014-08-07 DIAGNOSIS — Z8659 Personal history of other mental and behavioral disorders: Secondary | ICD-10-CM | POA: Insufficient documentation

## 2014-08-07 DIAGNOSIS — R102 Pelvic and perineal pain: Secondary | ICD-10-CM

## 2014-08-07 HISTORY — DX: Opioid abuse, in remission: F11.11

## 2014-08-07 LAB — URINALYSIS, ROUTINE W REFLEX MICROSCOPIC
Bilirubin Urine: NEGATIVE
GLUCOSE, UA: NEGATIVE mg/dL
Ketones, ur: NEGATIVE mg/dL
Leukocytes, UA: NEGATIVE
Nitrite: NEGATIVE
PH: 5.5 (ref 5.0–8.0)
PROTEIN: NEGATIVE mg/dL
Specific Gravity, Urine: 1.02 (ref 1.005–1.030)
Urobilinogen, UA: 0.2 mg/dL (ref 0.0–1.0)

## 2014-08-07 LAB — WET PREP, GENITAL
Trich, Wet Prep: NONE SEEN
Yeast Wet Prep HPF POC: NONE SEEN

## 2014-08-07 LAB — URINE MICROSCOPIC-ADD ON

## 2014-08-07 MED ORDER — OXYCODONE-ACETAMINOPHEN 5-325 MG PO TABS
2.0000 | ORAL_TABLET | Freq: Once | ORAL | Status: AC
  Administered 2014-08-07: 2 via ORAL
  Filled 2014-08-07: qty 2

## 2014-08-07 MED ORDER — MORPHINE SULFATE 2 MG/ML IJ SOLN
8.0000 mg | Freq: Once | INTRAMUSCULAR | Status: AC
  Administered 2014-08-07: 8 mg via INTRAMUSCULAR
  Filled 2014-08-07: qty 4

## 2014-08-07 MED ORDER — METRONIDAZOLE 500 MG PO TABS: 500.0000 mg | ORAL_TABLET | Freq: Two times a day (BID) | ORAL | Status: DC

## 2014-08-07 NOTE — ED Notes (Signed)
Pt requesting more pain med. Patient stated the 8mg  IM morphine did not help. EDP aware.

## 2014-08-07 NOTE — Discharge Instructions (Signed)
°Emergency Department Resource Guide °1) Find a Doctor and Pay Out of Pocket °Although you won't have to find out who is covered by your insurance plan, it is a good idea to ask around and get recommendations. You will then need to call the office and see if the doctor you have chosen will accept you as a new patient and what types of options they offer for patients who are self-pay. Some doctors offer discounts or will set up payment plans for their patients who do not have insurance, but you will need to ask so you aren't surprised when you get to your appointment. ° °2) Contact Your Local Health Department °Not all health departments have doctors that can see patients for sick visits, but many do, so it is worth a call to see if yours does. If you don't know where your local health department is, you can check in your phone book. The CDC also has a tool to help you locate your state's health department, and many state websites also have listings of all of their local health departments. ° °3) Find a Walk-in Clinic °If your illness is not likely to be very severe or complicated, you may want to try a walk in clinic. These are popping up all over the country in pharmacies, drugstores, and shopping centers. They're usually staffed by nurse practitioners or physician assistants that have been trained to treat common illnesses and complaints. They're usually fairly quick and inexpensive. However, if you have serious medical issues or chronic medical problems, these are probably not your best option. ° °No Primary Care Doctor: °- Call Health Connect at  832-8000 - they can help you locate a primary care doctor that  accepts your insurance, provides certain services, etc. °- Physician Referral Service- 1-800-533-3463 ° °Chronic Pain Problems: °Organization         Address  Phone   Notes  °[facility]  (336) 297-2271 Patients need to be referred by their primary care doctor.  ° °Medication  Assistance: °Organization         Address  Phone   Notes  °[place] [facility] [address] °[place], [place] [address] (336) 641-8030 --Must be a resident of [place] °-- Must have NO insurance coverage whatsoever (no Medicaid/ Medicare, etc.) °-- The pt. MUST have a primary care doctor that directs their care regularly and follows them in the community °  °[facility]  (866) 331-1348   °[employer]  (888) 892-1162   ° °Agencies that provide inexpensive medical care: °Organization         Address  Phone   Notes  °[facility]  (336) 832-8035   °[facility]    (336) 832-7272   °[facility] [address] °[place], [place] [address] (336) 832-4777   °[facility] [address], °[place] (336) 271-4999   °[facility]    (336) 373-0678   °[facility]    (336) 272-1050   °[facility] ° [address], [place] Phone:  (336) 832-4444, Fax:  (336) 832-4440 Hours of Operation:  9 am - 6 pm, M-F.  Also accepts Medicaid/Medicare and self-pay.  °[facility] ° [address], [place] Phone: (336) 832-3150, Fax: (336) [phone]. Hours of Operation:  8:30 am - 5:30 pm, M-F.  Also accepts Medicaid and self-pay.  °[facility] [place] [address], [place] Phone: (336) 878-6027   °[facility] [address], [place], [place] (336)723-1848, Ext. 123 Mondays & Thursdays: 7-9 AM.  First 15 patients are seen on a first come, first serve basis. °  ° °Medicaid-accepting [place] Providers: ° °Organization         Address  Phone   Notes  °[facility] [address], [place] (336) 641-2100 Also accepts self-pay patients.  °[facility] [address], [place] ° (336) 856-9996   °[facility] [address], [place]  (336) 288-8857   °[facility] [address] [place] [address], [place] (336) 299-7000   °Veita Bland [address], [place]  ° (336) 373-1557 Only accepts [place] Access Medicaid patients after they have their name applied to their card.  ° °Self-Pay (no insurance) in [place]: ° °Organization         Address  Phone   Notes  °Sickle Cell Patients, [facility] [address], [place] (336) 832-1970   °[facility] [address], [place] (336) 832-4400   °[facility] ° [address], [place] (336) 992-4800   °[facility]/Dr. Osei-Bonsu ° [address] [place] [address], [place] or [address], [place] (336) 841-8500 Phone number for both [place] and [place] locations is the same.  °[facility] [address], [place] (336) 299-0000   °[facility] [address] [place] [address], [place] or [address] (336) 852-7530 °(336) 878-2260   °[facility] [address], [place] (336) 350-1642, phone; (336) 294-5005, fax Sees patients 1st and 3rd Saturday of every month.  Must not qualify for public or private insurance (i.e. Medicaid, Medicare, [place] Health Choice, Veterans' Benefits) • Household income should be no more than 200% of the poverty level •The clinic cannot treat you if you are pregnant or think you are pregnant • Sexually transmitted diseases are not treated at the clinic.  ° ° °Dental Care: °Organization         Address  Phone  Notes  °[place] [facility] [address], [place] (336) 641-6152 Accepts children up to age 21 who are enrolled in Medicaid or [place] Health Choice; pregnant women with a Medicaid card; and children who have applied for Medicaid or [place] Health Choice, but were declined, whose parents can pay a reduced fee at time of service.  °[place]  [facility] [place]  [address], [place] (336) 641-7733 Accepts children up to age 21 who are enrolled in Medicaid or [place] Health Choice; pregnant women with a Medicaid card; and children who have applied for Medicaid or [place] Health Choice, but were declined, whose parents can pay a reduced fee at time of service.  °[facility] ° [address], [place] (336) 641-4533 Patients are seen by appointment only. Walk-ins are not accepted. [facility] will see patients 18 years of age and older. °Monday - Tuesday (8am-5pm) °Most Wednesdays (8:30-5pm) °$30 per visit, cash only  °[facility] ° [address], [place] (336) 641-4533 Patients are seen by appointment only. Walk-ins are not accepted. [facility] will see patients 18 years of age and older. °One   Wednesday Evening (Monthly: Volunteer Based).  $30 per visit, cash only  °[facility]  (919) 537-3737 for adults; Children under age 4, call Graduate Pediatric Dentistry at ([phone]) [phone]. Children aged 4-14, please call (919) [phone] to request a pediatric application. ° Dental services are provided in all areas of dental care including fillings, crowns and bridges, complete and partial dentures, implants, gum treatment, root canals, and extractions. Preventive care is also provided. Treatment is provided to both adults and children. °Patients are selected via a lottery and there is often a waiting list. °  °[facility] [address], °[place] ° (336) 763-8833 [email or web address] °  °[facility] [address], [place], [place] (336)723-1848, Ext. 123 Second and Fourth Thursday of each month, opens at 6:30 AM; Clinic ends at 9 AM.  Patients are seen on a first-come first-served basis, and a limited number are seen during each clinic.  ° °[facility] ° [address], [place], [place] (336) 723-7904    Eligibility Requirements °You must have lived in [place], [place], or [place] counties for at least the last three months. °  You cannot be eligible for state or federal sponsored healthcare insurance, including Veterans Administration, Medicaid, or Medicare. °  You generally cannot be eligible for healthcare insurance through your employer.  °  How to apply: °Eligibility screenings are held every Tuesday and Wednesday afternoon from 1:00 pm until 4:00 pm. You do not need an appointment for the interview!  °[facility] [address], [place], [place] 336-631-2330   °[facility] [facility]  336-342-8273   °[facility]  336-703-3100   °[facility]  336-570-6415   ° °Behavioral Health Resources in the Community: °Intensive Outpatient Programs °Organization         Address  Phone  Notes  °[facility] [address], [place], [place] 336-878-6098   °[facility] [address], [place], [place] 336-832-9800   °[facility] [address], [place], [place] ° 336-882-2125   °[facility] [address],  °[place], [place] 1-800-853-5163 or 336-641-4981   °Substance Abuse Resources °Organization         Address  Phone  Notes  °[facility]  336-882-2125   °[facility]  336-784-9470   °[facility]  336-285-9073   °[facility]  336-845-3988   °Residential & Outpatient Substance Abuse Program  1-800-659-3381   °Psychological Services °Organization         Address  Phone  Notes  °[facility]  336- 832-9600   °[facility]  336- 378-7881   °[facility] [address], [place] 1-800-853-5163 or 336-641-4981   ° °Mobile Crisis Teams °Organization         Address  Phone  Notes  °[facility], Mobile Crisis Care Unit  1-877-626-1772   °Assertive °[facility] ° [address]  [place], [place] 336-834-9664   °Sharon DeEsch [address] °[place] 336-554-5454   ° °Self-Help/Support Groups °Organization         Address  Phone             Notes  °[facility] of  - variety of support groups  336- 373-1402 Call for more information  °Narcotics Anonymous (NA), [facility] [address], °[place]  2 meetings at this location  ° °  Residential Treatment Programs Organization         Address  Phone  Notes  [facility] [address],    [place]  [phone]   [facility]  [address], [place] [phone], [place], [place] [phone]   [facility] [address], [place] [phone] Admissions: 8am-3pm M-F  [facility] [address]    [place], [place] [phone]   [facility] [address], [place], [place] 366-440-3474(539)[phone]   [facility] [address]  [place], [place] [phone]   [facility] Intensive Outpatient [address], [place], [place] [phone]   [facility] ([facility] [address]  [place], [place] [phone] or [phone]   [facility] ([facility]) [address] [place], [place] [phone] Accepts Medicaid  [facility] [address]  [place] [phone] Substance Abuse/Addiction Treatment   [facility] Organization         Address  Phone  Notes  [facility]  209-463-6640(888) (651)617-4220   Angie FavaJulie Brannon, PhD [address] [place], [place]   [phone](336) 330-042-6471 or [phone](336) [phone]   [facility]   [address] [place], [place] ([phone](336) ([phone]   [facility] [address], [place], [place] (252) [phone](336) ([phone] Insurance/Medicaid/sponsorship through [facility] [address]                                    [place], [place] [phone](336) ([phone] Therapy/tele-psych/case    [facility] [address].   [place], [place] ([phone]) [phone](336) ([phone]    Dr. Lolly MustacheArfeen  ([phone](336) [phone]   [facility] [place] [facility]  [employer] [facility] 1) [address], [place] 2) [address], [place] 3)  [address], [place] ([phone](336) [phone](336) [phone]  [phone](336) [phone]   [facility] Child Abuse Hot[facility] (709)003-3309(336) (225)148-1462 or (725) 554-4817(336) 701-411-1289 (After Hours)      Take the prescription as directed.  Apply moist heat or ice to the area(s) of discomfort, for 15 minutes at a time, several times per day for the next few days.  Do not fall asleep on a heating or ice pack.  Call your regular OB/GYN doctor tomorrow to schedule a follow up appointment in the next 2 days.  Return to the Emergency Department immediately if worsening.

## 2014-08-07 NOTE — ED Notes (Signed)
Pt co lower abd pain and nausea since yesterday. Pt states she has had right ovary and uterus removed but had some blood on tissue after urinating this am. Pt unsure of source of blood, but thinks it was vaginal. Denies v/d. nad noted.

## 2014-08-07 NOTE — ED Provider Notes (Signed)
CSN: [id number]     Arrival date & time 08/07/14  0845 History   First MD Initiated Contact with Patient 08/07/14 [phone]     Chief Complaint  Patient presents with  . Pelvic Pain      HPI  Pt was seen at 0900. Per pt, c/o gradual onset and persistence of constant acute flair of her chronic pelvic "pain" for the past several years. Pt states the pain worsened yesterday when she ran out of her chronic narcotic pain medication yesterday. States she is due to see her OB/GYN MD in 2 days for "left ovary issues."  Pt also states she "saw blood on the toilet paper" after she urinated this morning. Pt is unsure where it came from (vaginal vs urine). Denies any change in her usual chronic pain pattern. Denies N/V/D, no back/flank pain, no fevers.    Past Medical History  Diagnosis Date  . Hypokalemia   . Narcotic abuse     5 yrs ago lortab, off few yrs  . Anxiety   . Chronic pain   . Endometriosis   . Chronic headache   . Chronic back pain   . Chronic pelvic pain in female   . 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 Heart murmur     as a child  . Pneumonia     89 years  old  . Hx of opioid abuse    Past Surgical History  Procedure Laterality Date  . Tonsillectomy    . Cholecystectomy  2010  . Tubal ligation    . Carpal tunnel release      both  . Incision and drainage abscess N/A 07/14/2014    Procedure: INCISION AND DRAINAGE VULVAR ABSCESS;  Surgeon: [name], MD;  Location: [facility];  Service: Gynecology;  Laterality: N/A;  . Abdominal hysterectomy  2007    left ovary remains   Family History  Problem Relation Age of Onset  . Stroke Father   . Diverticulitis Father   . Heart disease    . Arthritis    . Diabetes    . Kidney disease    . Hypertension Mother   . Diabetes Mother    History  Substance Use Topics  . Smoking status: Current Every Day Smoker -- 0.50 packs/day for 18 years    Types: Cigarettes  . Smokeless tobacco: Never Used  . Alcohol Use: No     Comment:  rarely   OB History    Gravida Para Term Preterm AB TAB SAB Ectopic Multiple Living   Review of Systems ROS: Statement: All systems negative except as marked or noted in the HPI; Constitutional: Negative for fever and chills. ; ; Eyes: Negative for eye pain, redness and discharge. ; ; ENMT: Negative for ear pain, hoarseness, nasal congestion, sinus pressure and sore throat. ; ; Cardiovascular: Negative for chest pain, palpitations, diaphoresis, dyspnea and peripheral edema. ; ; Respiratory: Negative for cough, wheezing and stridor. ; ; Gastrointestinal: Negative for nausea, vomiting, diarrhea, abdominal pain, blood in stool, hematemesis, jaundice and rectal bleeding. . ; ; Genitourinary: Negative for dysuria, flank pain and hematuria. ; ; GYN:  +pelvic pain. No vaginal bleeding, no vaginal discharge, no vulvar pain. ;; Musculoskeletal: Negative for back pain and neck pain. Negative for swelling and trauma.; ; Skin: Negative for pruritus, rash, abrasions, blisters, bruising and skin lesion.; ; Neuro: Negative for headache, lightheadedness and neck stiffness.  Negative for weakness, altered level of consciousness , altered mental status, extremity weakness, paresthesias, involuntary movement, seizure and syncope.      Allergies  Fentanyl; Divalproex sodium; Fioricet-codeine; Ibuprofen; and Ketorolac tromethamine  Home Medications   Prior to Admission medications   Medication Sig Start Date End Date Taking? Authorizing Provider  amoxicillin-clavulanate (AUGMENTIN) 500-125 MG per tablet Take 1 tablet (500 mg total) by mouth 3 (three) times daily. 07/16/14   [name], MD  diphenhydrAMINE (BENADRYL) 25 MG tablet Take 25 mg by mouth every 6 (six) hours as needed for allergies.    Historical Provider, MD  HYDROcodone-acetaminophen (NORCO/VICODIN) 5-325 MG per tablet Take 1 tablet by mouth every 6 (six) hours as needed. 07/20/14   [name], MD  methocarbamol (ROBAXIN) 500 MG  tablet Take 500 mg by mouth every 4 (four) hours as needed. FOR MUSCLE SPASMS 07/03/14   Historical Provider, MD  promethazine (PHENERGAN) 25 MG tablet Take 1 tablet (25 mg total) by mouth every 6 (six) hours as needed for nausea or vomiting. 07/17/14   [name], MD  sulfamethoxazole-trimethoprim (BACTRIM DS,SEPTRA DS) 800-160 MG per tablet Take 1 tablet by mouth 2 (two) times daily. Patient not taking: Reported on 07/17/2014 07/11/14   [name], MD   BP 109/65 mmHg  Pulse 92  Temp(Src) 98.1 F (36.7 C)  Resp 18  Ht  (1.549 m)  Wt 125 lb (56.7 kg)  BMI 23.63 kg/m2  SpO2 100% Physical Exam  0905: Physical examination:  Nursing notes reviewed; Vital signs and O2 SAT reviewed;  Constitutional: Well developed, Well nourished, Well hydrated, In no acute distress; Head:  Normocephalic, atraumatic; Eyes: EOMI, PERRL, No scleral icterus; ENMT: Mouth and pharynx normal, Mucous membranes moist; Neck: Supple, Full range of motion, No lymphadenopathy; Cardiovascular: Regular rate and rhythm, No murmur, rub, or gallop; Respiratory: Breath sounds clear & equal bilaterally, No rales, rhonchi, wheezes.  Speaking full sentences with ease, Normal respiratory effort/excursion; Chest: Nontender, Movement normal; Abdomen: Soft, +left pelvic area tender to palp. No rebound or guarding. Nondistended, Normal bowel sounds; Genitourinary: No CVA tenderness. Pelvic exam performed with permission of pt and female ED tech assist during exam.  External genitalia with well healed surgical scars, no erythema, no lesions, no abscess. Vaginal vault with thin white discharge.  Cervix surgically absent, GC/chlam and wet prep obtained and sent to lab.  Bimanual exam w/o CMT, uterine or right adnexal tenderness. +left pelvic tenderness to palp.; Extremities: Pulses normal, No tenderness, No edema, No calf edema or asymmetry.; Neuro: AA&Ox3, Major CN grossly intact.  Speech clear. No gross focal motor or sensory deficits in  extremities.; Skin: Color normal, Warm, Dry.   ED Course  Procedures   0915:  EPIC chart reviewed: pt has been evaluated in her OB/GYN MD ofc 4 times this past month, mostly for pain related issues. Per telephone notes, pt was given a 2nd rx norco by her OB/GYN MD on 5/15 (while already having a current norco rx) and told this would need to last her until her 6/1 appt. Pt reminded of this. Pt stated she "took it as I was supposed to" and "ran out yesterday." Aware I will treat her chronic pain while she is in the ED but will be unable to write her a prescription for narcotic pain medication on d/c. Pt verb understanding.   1155:  Pt has tol PO well while in the ED without N/V. Will tx for BV while GC/chlam pending. Workup  otherwise reassuring. Pt with significant hx of chronic pelvic pain and opiate abuse. Asking for "more pain meds." Aware she will receive a dose of percocet here and will need to contact her OB/GYN for further narcotic rx; pt verb understanding. States she is ready to go home now. Dx and testing d/w pt and family.  Questions answered.  Verb understanding, agreeable to d/c home with outpt f/u.    MDM  MDM Reviewed: previous chart, nursing note and vitals Reviewed previous: ultrasound Interpretation: ultrasound and labs      Results for orders placed or performed during the hospital encounter of 08/07/14  Wet prep, genital  Result Value Ref Range   Yeast Wet Prep HPF POC NONE SEEN NONE SEEN   Trich, Wet Prep NONE SEEN NONE SEEN   Clue Cells Wet Prep HPF POC MANY (A) NONE SEEN   WBC, Wet Prep HPF POC FEW (A) NONE SEEN  Urinalysis, Routine w reflex microscopic  Result Value Ref Range   Color, Urine YELLOW YELLOW   APPearance CLEAR CLEAR   Specific Gravity, Urine 1.020 1.005 - 1.030   pH 5.5 5.0 - 8.0   Glucose, UA NEGATIVE NEGATIVE mg/dL   Hgb urine dipstick TRACE (A) NEGATIVE   Bilirubin Urine NEGATIVE NEGATIVE   Ketones, ur NEGATIVE NEGATIVE mg/dL   Protein, ur  NEGATIVE NEGATIVE mg/dL   Urobilinogen, UA 0.2 0.0 - 1.0 mg/dL   Nitrite NEGATIVE NEGATIVE   Leukocytes, UA NEGATIVE NEGATIVE  Urine microscopic-add on  Result Value Ref Range   Squamous Epithelial / LPF FEW (A) RARE   WBC, UA 0-2 <3 WBC/hpf   RBC / HPF 0-2 <3 RBC/hpf   Bacteria, UA FEW (A) RARE    [place] Pelvis Complete 08/07/2014   CLINICAL DATA:  Pelvic pain.  Evaluate left ovarian torsion.  EXAM: TRANSABDOMINAL AND TRANSVAGINAL ULTRASOUND OF PELVIS  DOPPLER ULTRASOUND OF OVARIES  TECHNIQUE: Both transabdominal and transvaginal ultrasound examinations of the pelvis were performed. Transabdominal technique was performed for global imaging of the pelvis including uterus, ovaries, adnexal regions, and pelvic cul-de-sac.  It was necessary to proceed with endovaginal exam following the transabdominal exam to visualize the left ovary. Color and duplex Doppler ultrasound was utilized to evaluate blood flow to the ovaries.  COMPARISON:  09/10/2013  FINDINGS: Uterus  Measurements: Previous cholecystectomy.  Endometrium  Thickness: Not applicable.  Right ovary  Measurements: Previous right oophorectomy. No adnexal mass.  Left ovary  Measurements: 3.3 x 2.4 x 2.2 cm. There is a 2 x 1.4 x 1.2 cm follicle noted.  Pulsed Doppler evaluation of both ovaries demonstrates normal low-resistance arterial and venous waveforms.  Other findings  Trace free fluid around the left ovary noted.  IMPRESSION: 1. No evidence for left ovarian torsion. 2. Previous hysterectomy and right salpingo-oophorectomy.   Electronically Signed   By: Signa Kellaylor  Stroud M.D.   On: 08/07/2014 10:02        Samuel JesterKathleen Jessy Cybulski, DO 08/10/14 2046

## 2014-08-08 LAB — GC/CHLAMYDIA PROBE AMP (~~LOC~~) NOT AT ARMC
Chlamydia: NEGATIVE
Neisseria Gonorrhea: NEGATIVE

## 2014-08-09 ENCOUNTER — Ambulatory Visit (INDEPENDENT_AMBULATORY_CARE_PROVIDER_SITE_OTHER): Payer: BLUE CROSS/BLUE SHIELD | Admitting: Obstetrics and Gynecology

## 2014-08-09 ENCOUNTER — Encounter: Payer: Self-pay | Admitting: Obstetrics and Gynecology

## 2014-08-09 DIAGNOSIS — R1032 Left lower quadrant pain: Secondary | ICD-10-CM | POA: Diagnosis not present

## 2014-08-09 DIAGNOSIS — N8320 Unspecified ovarian cysts: Secondary | ICD-10-CM

## 2014-08-09 DIAGNOSIS — R102 Pelvic and perineal pain: Secondary | ICD-10-CM

## 2014-08-09 DIAGNOSIS — N764 Abscess of vulva: Secondary | ICD-10-CM

## 2014-08-09 DIAGNOSIS — Z01818 Encounter for other preprocedural examination: Secondary | ICD-10-CM | POA: Diagnosis not present

## 2014-08-09 MED ORDER — HYDROCODONE-ACETAMINOPHEN 7.5-300 MG PO TABS: 1.0000 | ORAL_TABLET | Freq: Three times a day (TID) | ORAL | Status: DC

## 2014-08-09 NOTE — Progress Notes (Signed)
Patient ID: [name], female   DOB: 07-05-1980, 34 y.o.   MRN: [id number]  Preoperative History and Physical  [name] is a 34 y.o. [id number] almost 36 weeks S/P I&D of vulvar abscess, here for surgical management of pelvic pain.   No significant preoperative concerns.  Patient is interested in having a laparoscopic left salpingooophorectomy. Patient would also like to have her bellybutton skin piercing corrected. Patient tried resuming sexual activity, but reports dyspareunia.   Patient was recently seen in the ED for a UTI. She states she finished a course of Septra.  Proposed surgery: laparoscopic left salpingooophorectomy  Past Medical History  Diagnosis Date  . Hypokalemia   . Narcotic abuse     5 yrs ago lortab, off few yrs  . Anxiety   . Chronic pain   . Endometriosis   . Chronic headache   . Chronic back pain   . Chronic pelvic pain in female   . 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 Heart murmur     as a child  . Pneumonia     1 years  old  . Hx of opioid abuse    Past Surgical History  Procedure Laterality Date  . Tonsillectomy    . Cholecystectomy  2010  . Tubal ligation    . Carpal tunnel release      both  . Incision and drainage abscess N/A 07/14/2014    Procedure: INCISION AND DRAINAGE VULVAR ABSCESS;  Surgeon: [name], MD;  Location: [facility];  Service: Gynecology;  Laterality: N/A;  . Abdominal hysterectomy  2007    left ovary remains   OB History  Gravida Para Term Preterm AB SAB TAB Ectopic Multiple Living  4 4 4       4     # Outcome Date GA Lbr Len/2nd Weight Sex Delivery Anes PTL Lv  4 Term           3 Term           2 Term           1 Term             Patient denies any other pertinent gynecologic issues.   Current Outpatient Prescriptions on File Prior to Visit  Medication Sig Dispense Refill  . ALPRAZolam (XANAX) 0.5 MG tablet Take 1 tablet by mouth 2 (two) times daily.  0  . amoxicillin-clavulanate (AUGMENTIN) 500-125 MG per tablet Take  1 tablet (500 mg total) by mouth 3 (three) times daily. (Patient not taking: Reported on 08/07/2014) 21 tablet 0  . HYDROcodone-acetaminophen (NORCO/VICODIN) 5-325 MG per tablet Take 1 tablet by mouth every 6 (six) hours as needed. (Patient not taking: Reported on 08/07/2014) 30 tablet 0  . metroNIDAZOLE (FLAGYL) 500 MG tablet Take 1 tablet (500 mg total) by mouth 2 (two) times daily. (Patient not taking: Reported on 08/09/2014) 14 tablet 0  . promethazine (PHENERGAN) 25 MG tablet Take 1 tablet (25 mg total) by mouth every 6 (six) hours as needed for nausea or vomiting. (Patient not taking: Reported on 08/07/2014) 15 tablet 1  . sulfamethoxazole-trimethoprim (BACTRIM DS,SEPTRA DS) 800-160 MG per tablet Take 1 tablet by mouth 2 (two) times daily. (Patient not taking: Reported on 07/17/2014) 20 tablet 1  . [DISCONTINUED] POTASSIUM PO Take 1 tablet by mouth daily.       No current facility-administered medications on file prior to visit.   Allergies  Allergen Reactions  . Fentanyl Itching  . Divalproex Sodium Anxiety  and Other (See Comments)    'makes me feel really weird' , felt distant from reality  . Fioricet-Codeine [Butalbital-Apap-Caff-Cod] Itching  . Ibuprofen Other (See Comments)    GI upset, stomach pain  . Ketorolac Tromethamine Hives and Other (See Comments)    Stomach pain     Social History:   reports that she has been smoking Cigarettes.  She has a 9 pack-year smoking history. She has never used smokeless tobacco. She reports that she does not drink alcohol or use illicit drugs.  Family History  Problem Relation Age of Onset  . Stroke Father   . Diverticulitis Father   . Heart disease    . Arthritis    . Diabetes    . Kidney disease    . Hypertension Mother   . Diabetes Mother     Review of Systems: Noncontributory  PHYSICAL EXAM: There were no vitals taken for this visit. General appearance - alert, well appearing, and in no distress Chest - clear to auscultation, no  wheezes, rales or rhonchi, symmetric air entry Heart - normal rate and regular rhythm Abdomen - soft, nontender, nondistended, no masses or organomegaly Pelvic - normal external genitalia. Well healed scars from recent vulvar abscess, with slight tenderness. No adenopathy.  VAGINA - tender vaginal cuff on the left, in area of ovary.  Extremities - peripheral pulses normal, no pedal edema, no clubbing or cyanosis  Labs: Results for orders placed or performed during the hospital encounter of 2014-08-24 (from the past 336 hour(s))  GC/Chlamydia probe amp ([place])not at [facility]   Collection Time: 08/24/2014 12:00 AM  Result Value Ref Range   Chlamydia Negative    Neisseria gonorrhea Negative   Urinalysis, Routine w reflex microscopic   Collection Time: 2014-08-24  9:00 AM  Result Value Ref Range   Color, Urine YELLOW YELLOW   APPearance CLEAR CLEAR   Specific Gravity, Urine 1.020 1.005 - 1.030   pH 5.5 5.0 - 8.0   Glucose, UA NEGATIVE NEGATIVE mg/dL   Hgb urine dipstick TRACE (A) NEGATIVE   Bilirubin Urine NEGATIVE NEGATIVE   Ketones, ur NEGATIVE NEGATIVE mg/dL   Protein, ur NEGATIVE NEGATIVE mg/dL   Urobilinogen, UA 0.2 0.0 - 1.0 mg/dL   Nitrite NEGATIVE NEGATIVE   Leukocytes, UA NEGATIVE NEGATIVE  Urine microscopic-add on   Collection Time: 08/24/2014  9:00 AM  Result Value Ref Range   Squamous Epithelial / LPF FEW (A) RARE   WBC, UA 0-2 <3 WBC/hpf   RBC / HPF 0-2 <3 RBC/hpf   Bacteria, UA FEW (A) RARE  Wet prep, genital   Collection Time: August 24, 2014 11:10 AM  Result Value Ref Range   Yeast Wet Prep HPF POC NONE SEEN NONE SEEN   Trich, Wet Prep NONE SEEN NONE SEEN   Clue Cells Wet Prep HPF POC MANY (A) NONE SEEN   WBC, Wet Prep HPF POC FEW (A) NONE SEEN    Imaging Studies: US Transvaginal Non-ob  Aug 24, 2014   CLINICAL DATA:  Pelvic pain.  Evaluate left ovarian torsion.  EXAM: TRANSABDOMINAL AND TRANSVAGINAL ULTRASOUND OF PELVIS  DOPPLER ULTRASOUND OF OVARIES  TECHNIQUE: Both  transabdominal and transvaginal ultrasound examinations of the pelvis were performed. Transabdominal technique was performed for global imaging of the pelvis including uterus, ovaries, adnexal regions, and pelvic cul-de-sac.  It was necessary to proceed with endovaginal exam following the transabdominal exam to visualize the left ovary. Color and duplex Doppler ultrasound was utilized to evaluate blood flow to the ovaries.  COMPARISON:  09/10/2013  FINDINGS: Uterus  Measurements: Previous cholecystectomy.  Endometrium  Thickness: Not applicable.  Right ovary  Measurements: Previous right oophorectomy. No adnexal mass.  Left ovary  Measurements: 3.3 x 2.4 x 2.2 cm. There is a 2 x 1.4 x 1.2 cm follicle noted.  Pulsed Doppler evaluation of both ovaries demonstrates normal low-resistance arterial and venous waveforms.  Other findings  Trace free fluid around the left ovary noted.  IMPRESSION: 1. No evidence for left ovarian torsion. 2. Previous hysterectomy and right salpingo-oophorectomy.   Electronically Signed   By: Signa Kellaylor  Stroud M.D.   On: 08/07/2014 10:02   [place] Pelvis Complete  08/07/2014   CLINICAL DATA:  Pelvic pain.  Evaluate left ovarian torsion.  EXAM: TRANSABDOMINAL AND TRANSVAGINAL ULTRASOUND OF PELVIS  DOPPLER ULTRASOUND OF OVARIES  TECHNIQUE: Both transabdominal and transvaginal ultrasound examinations of the pelvis were performed. Transabdominal technique was performed for global imaging of the pelvis including uterus, ovaries, adnexal regions, and pelvic cul-de-sac.  It was necessary to proceed with endovaginal exam following the transabdominal exam to visualize the left ovary. Color and duplex Doppler ultrasound was utilized to evaluate blood flow to the ovaries.  COMPARISON:  09/10/2013  FINDINGS: Uterus  Measurements: Previous cholecystectomy.  Endometrium  Thickness: Not applicable.  Right ovary  Measurements: Previous right oophorectomy. No adnexal mass.  Left ovary  Measurements: 3.3 x 2.4 x 2.2  cm. There is a 2 x 1.4 x 1.2 cm follicle noted.  Pulsed Doppler evaluation of both ovaries demonstrates normal low-resistance arterial and venous waveforms.  Other findings  Trace free fluid around the left ovary noted.  IMPRESSION: 1. No evidence for left ovarian torsion. 2. Previous hysterectomy and right salpingo-oophorectomy.   Electronically Signed   By: Signa Kellaylor  Stroud M.D.   On: 08/07/2014 10:02   [place] Art/ven Flow Abd Pelv Doppler  08/07/2014   CLINICAL DATA:  Pelvic pain.  Evaluate left ovarian torsion.  EXAM: TRANSABDOMINAL AND TRANSVAGINAL ULTRASOUND OF PELVIS  DOPPLER ULTRASOUND OF OVARIES  TECHNIQUE: Both transabdominal and transvaginal ultrasound examinations of the pelvis were performed. Transabdominal technique was performed for global imaging of the pelvis including uterus, ovaries, adnexal regions, and pelvic cul-de-sac.  It was necessary to proceed with endovaginal exam following the transabdominal exam to visualize the left ovary. Color and duplex Doppler ultrasound was utilized to evaluate blood flow to the ovaries.  COMPARISON:  09/10/2013  FINDINGS: Uterus  Measurements: Previous cholecystectomy.  Endometrium  Thickness: Not applicable.  Right ovary  Measurements: Previous right oophorectomy. No adnexal mass.  Left ovary  Measurements: 3.3 x 2.4 x 2.2 cm. There is a 2 x 1.4 x 1.2 cm follicle noted.  Pulsed Doppler evaluation of both ovaries demonstrates normal low-resistance arterial and venous waveforms.  Other findings  Trace free fluid around the left ovary noted.  IMPRESSION: 1. No evidence for left ovarian torsion. 2. Previous hysterectomy and right salpingo-oophorectomy.   Electronically Signed   By: Signa Kellaylor  Stroud M.D.   On: 08/07/2014 10:02    Assessment: Patient Active Problem List   Diagnosis Date Noted  . Vulvar abscess left labia majora 07/14/2014  . Other and unspecified ovarian cyst 09/16/2013  . Abdominal pain, left lower quadrant 09/16/2013  . LBP (low back pain)  12/16/2012  . Pelvic and perineal pain 10/25/2012  . Shoulder pain 03/17/2012  . Colitis 01/16/2012  . Abdominal pain 01/16/2012  . Cocaine abuse 09/28/2011  . Dental decay 09/28/2011  . Hormonal disorder 09/28/2011  . Hypokalemia   .  Narcotic abuse     Plan: Patient will undergo surgical management with laparoscopic left salpingooophorectomy.    [name], MD  08/09/2014 9:52 AM   This chart was SCRIBED for Christin Bach, MD by Ronney Lion, ED Scribe. This patient was seen in room 1 and the patient's care was started at 9:48 AM.  I personally performed the services described in this documentation, which was SCRIBED in my presence. The recorded information has been reviewed and considered accurate. It has been edited as necessary during review. [name], MD

## 2014-08-10 LAB — URINE CULTURE
Colony Count: NO GROWTH
Culture: NO GROWTH

## 2014-08-11 ENCOUNTER — Encounter (HOSPITAL_COMMUNITY)
Admission: RE | Admit: 2014-08-11 | Discharge: 2014-08-11 | Disposition: A | Discharge status: Home / Self Care | Payer: BLUE CROSS/BLUE SHIELD | Source: Ambulatory Visit | Attending: Obstetrics and Gynecology | Admitting: Obstetrics and Gynecology

## 2014-08-11 ENCOUNTER — Other Ambulatory Visit (HOSPITAL_COMMUNITY): Payer: BLUE CROSS/BLUE SHIELD

## 2014-08-11 NOTE — Patient Instructions (Addendum)
[name]  08/11/2014     @   Your procedure is scheduled on 08/15/2014  Report to [facility] at 10:00 A.M.  Call this number if you have problems the morning of surgery:  [phone]   Remember:  Do not eat food or drink liquids after midnight.  Take these medicines the morning of surgery with A SIP OF WATER : Xanax   Do not wear jewelry, make-up or nail polish.  Do not wear lotions, powders, or perfumes.  You may wear deodorant.  Do not shave 48 hours prior to surgery.  Men may shave face and neck.  Do not bring valuables to the hospital.  [facility] is not responsible for any belongings or valuables.  Contacts, dentures or bridgework may not be worn into surgery.  Leave your suitcase in the car.  After surgery it may be brought to your room.  For patients admitted to the hospital, discharge time will be determined by your treatment team.  Patients discharged the day of surgery will not be allowed to drive home.   Name and phone number of your driver:   family Special instructions:  Hibiclens Wash: Use the evening prior to surgery and the morning of surgery before coming to the hospital.  Please read over the following fact sheets that you were given. Care and Recovery After Surgery   Bilateral Salpingo-Oophorectomy Bilateral salpingo-oophorectomy is the surgical removal of both fallopian tubes and both ovaries. The ovaries are small organs that produce eggs in women. The fallopian tubes transport the egg from the ovary to the womb (uterus). Usually, when this surgery is done, the uterus was previously removed. A bilateral salpingo-oophorectomy may be done to treat cancer or to reduce the risk of cancer in women who are at high risk. Removing both fallopian tubes and both ovaries will make you unable to become pregnant (sterile). It will also put you into menopause so that you will no longer have menstrual periods and may have menopausal symptoms such as hot  flashes, night sweats, and mood changes. It will not affect your sex drive. LET [facility] CARE PROVIDER KNOW ABOUT:  Any allergies you have.  All medicines you are taking, including vitamins, herbs, eye drops, creams, and over-the-counter medicines.  Previous problems you or members of your family have had with the use of anesthetics.  Any blood disorders you have.  Previous surgeries you have had.  Medical conditions you have. RISKS AND COMPLICATIONS Generally, this is a safe procedure. However, as with any procedure, complications can occur. Possible complications include:  Injury to surrounding organs.  Bleeding.  Infection.  Blood clots in the legs or lungs.  Problems related to anesthesia. BEFORE THE PROCEDURE  Ask your health care provider about changing or stopping your regular medicines. You may need to stop taking certain medicines, such as aspirin or blood thinners, at least 1 week before the surgery.  Do not eat or drink anything for at least 8 hours before the surgery.  If you smoke, do not smoke for at least 2 weeks before the surgery.  Make plans to have someone drive you home after the procedure or after your hospital stay. Also arrange for someone to help you with activities during recovery. PROCEDURE   You will be given medicine to help you relax before the procedure (sedative). You will then be given medicine to make you sleep through the procedure (general anesthetic). These medicines will be given through an IV access tube  that is put into one of your veins.  Once you are asleep, your lower abdomen will be shaved and cleaned. A thin, flexible tube (catheter) will be placed in your bladder.  The surgeon may use a laparoscopic, robotic, or open technique for this surgery:  In the laparoscopic technique, the surgery is done through two small cuts (incisions) in the abdomen. A thin, lighted tube with a tiny camera on the end (laparoscope) is inserted into  one of the incisions. The tools needed for the procedure are put through the other incision.  A robotic technique may be chosen to perform complex surgery in a small space. In the robotic technique, small incisions will be made. A camera and surgical instruments are passed through the incisions. Surgical instruments will be controlled with the help of a robotic arm.  In the open technique, the surgery is done through one large incision in the abdomen.  Using any of these techniques, the surgeon removes the fallopian tubes and ovaries. The blood vessels will be clamped and tied.  The surgeon then uses staples or stitches to close the incision or incisions. AFTER THE PROCEDURE  You will be taken to a recovery area where you will be monitored for 1 to 3 hours. Your blood pressure, pulse, and temperature will be checked often. You will remain in the recovery area until you are stable and waking up.  If the laparoscopic technique was used, you may be allowed to go home after several hours. You may have some shoulder pain after the laparoscopic procedure. This is normal and usually goes away in a day or two.  If the open technique was used, you will be admitted to the hospital for a couple of days.  You will be given pain medicine as needed.  The IV access tube and catheter will be removed before you are discharged. Document Released: 02/24/2005 Document Revised: 03/01/2013 Document Reviewed: 08/18/2012 [facility] Patient Information 2015 [place], [place]. This information is not intended to replace advice given to you by your health care provider. Make sure you discuss any questions you have with your health care provider.

## 2014-08-14 ENCOUNTER — Encounter (HOSPITAL_COMMUNITY): Payer: Self-pay

## 2014-08-14 ENCOUNTER — Encounter (HOSPITAL_COMMUNITY): Payer: BLUE CROSS/BLUE SHIELD

## 2014-08-14 ENCOUNTER — Encounter (HOSPITAL_COMMUNITY)
Admission: RE | Admit: 2014-08-14 | Discharge: 2014-08-14 | Disposition: A | Discharge status: Home / Self Care | Payer: BLUE CROSS/BLUE SHIELD | Source: Ambulatory Visit | Attending: Obstetrics and Gynecology | Admitting: Obstetrics and Gynecology

## 2014-08-14 ENCOUNTER — Other Ambulatory Visit: Payer: Self-pay

## 2014-08-14 DIAGNOSIS — F17210 Nicotine dependence, cigarettes, uncomplicated: Secondary | ICD-10-CM | POA: Diagnosis not present

## 2014-08-14 DIAGNOSIS — Z90710 Acquired absence of both cervix and uterus: Secondary | ICD-10-CM | POA: Diagnosis not present

## 2014-08-14 DIAGNOSIS — F419 Anxiety disorder, unspecified: Secondary | ICD-10-CM | POA: Diagnosis not present

## 2014-08-14 DIAGNOSIS — Z79899 Other long term (current) drug therapy: Secondary | ICD-10-CM | POA: Diagnosis not present

## 2014-08-14 DIAGNOSIS — G8929 Other chronic pain: Secondary | ICD-10-CM | POA: Diagnosis not present

## 2014-08-14 DIAGNOSIS — R1032 Left lower quadrant pain: Secondary | ICD-10-CM | POA: Diagnosis present

## 2014-08-14 LAB — BASIC METABOLIC PANEL
ANION GAP: 6 (ref 5–15)
BUN: 7 mg/dL (ref 6–20)
CALCIUM: 9.2 mg/dL (ref 8.9–10.3)
CO2: 28 mmol/L (ref 22–32)
CREATININE: 0.57 mg/dL (ref 0.44–1.00)
Chloride: 106 mmol/L (ref 101–111)
GFR calc Af Amer: >60 mL/min (ref >60)
GLUCOSE: 87 mg/dL (ref 65–99)
Potassium: 4.3 mmol/L (ref 3.5–5.1)
SODIUM: 140 mmol/L (ref 135–145)

## 2014-08-14 LAB — CBC
HCT: 39.2 % (ref 36.0–46.0)
Hemoglobin: 13 g/dL (ref 12.0–15.0)
MCH: 35.6 pg — ABNORMAL HIGH (ref 26.0–34.0)
MCHC: 33.2 g/dL (ref 30.0–36.0)
MCV: 107.4 fL — ABNORMAL HIGH (ref 78.0–100.0)
Platelets: 238 K/uL (ref 150–400)
RBC: 3.65 MIL/uL — ABNORMAL LOW (ref 3.87–5.11)
RDW: 12 % (ref 11.5–15.5)
WBC: 6.8 K/uL (ref 4.0–10.5)

## 2014-08-14 LAB — HCG, SERUM, QUALITATIVE: Preg, Serum: NEGATIVE

## 2014-08-14 NOTE — H&P (Signed)
[name] is an 34 y.o. female. She is admitted for laparoscopic left salpingoophorectomy for chronic LLQ pain that seems associated with the left adnexa., worsened by normal ovulatory cyst formation. She is s/p hysterectomy and removal of the cervix, and the right adnexa.  The patient is fully aware that surgical excision of the remaining ovary with cause surgical menopause, and likely hormone therapy with estrogen will be considered postop.  Recently she has had surgery for a severe left vulvar abscess deep in the left labia majora, requring I&D, and now well healed with only slight fibrosis. The patient has a low pain tolerance, chronic opioid use and a history of opiod abuse., and has required greater than average pain meds for her care. The patient has been informed several times that we will quickly wean her from opiates postop. The risks of continued pain or discomfort , or dyspareunia, may persisit despite surgery, and the patient has been specifically informed that guarantees of relief of the intermittent chronic pain cannot be given.  Pertinent Gynecological History: Menses: s/p hysterectomy Bleeding: none Contraception: status post hysterectomy DES exposure: unknown Blood transfusions: none Sexually transmitted diseases: no past history Previous GYN Procedures: Abd hysterectomy, right salpingoophorectomy, I&D of left vulvar abscess  Last mammogram:  Date:  Last pap: normal Date:  OB History: G, P   Menstrual History: Menarche age:  No LMP recorded. Patient has had a hysterectomy.    Past Medical History  Diagnosis Date  . Hypokalemia   . Narcotic abuse     5 yrs ago lortab, off few yrs  . Anxiety   . Chronic pain   . Endometriosis   . Chronic headache   . Chronic back pain   . Chronic pelvic pain in female   . Heart murmur     as a child  . Pneumonia     65 years  old  . Hx of opioid abuse   . Renal disorder Cyst on Kidney    Past Surgical History  Procedure  Laterality Date  . Tonsillectomy    . Cholecystectomy  2010  . Tubal ligation    . Carpal tunnel release      both  . Incision and drainage abscess N/A 07/14/2014    Procedure: INCISION AND DRAINAGE VULVAR ABSCESS;  Surgeon: [name], MD;  Location: [facility];  Service: Gynecology;  Laterality: N/A;  . Abdominal hysterectomy  2007    left ovary remains    Family History  Problem Relation Age of Onset  . Stroke Father   . Diverticulitis Father   . Heart disease    . Arthritis    . Diabetes    . Kidney disease    . Hypertension Mother   . Diabetes Mother     Social History:  reports that she has been smoking Cigarettes.  She has a 9 pack-year smoking history. She has never used smokeless tobacco. She reports that she does not drink alcohol or use illicit drugs.  Allergies:  Allergies  Allergen Reactions  . Fentanyl Itching  . Divalproex Sodium Anxiety and Other (See Comments)    'makes me feel really weird' , felt distant from reality  . Fioricet-Codeine [Butalbital-Apap-Caff-Cod] Itching  . Ibuprofen Other (See Comments)    GI upset, stomach pain  . Ketorolac Tromethamine Hives and Other (See Comments)    Stomach pain     No prescriptions prior to admission    ROS  There were no vitals taken for this visit.  Physical Exam  Constitutional: She is oriented to person, place, and time. She appears well-developed and well-nourished.  HENT:  Head: Normocephalic.  Eyes: EOM are normal. Pupils are equal, round, and reactive to light.  Neck: Normal range of motion.  Cardiovascular: Normal rate and regular rhythm.   Respiratory: Effort normal.  GI: Soft. Bowel sounds are normal. She exhibits no distension and no mass. There is no tenderness. There is no rebound and no guarding.  Genitourinary: Vagina normal.  Well healed vaginal cuff. Intolerance of cuff manipulation. Especially on the left. Where vaginal u/s shows the left ovary is closely adjacent to the cuff and may  have adhesions.  Musculoskeletal: Normal range of motion. She exhibits no edema or tenderness.  Neurological: She is alert and oriented to person, place, and time. She has normal reflexes.  Skin: Skin is warm and dry.  Psychiatric: She has a normal mood and affect. Her behavior is normal. Thought content normal.  Somber affect.    Results for orders placed or performed during the hospital encounter of 08/14/14 (from the past 24 hour(s))  CBC     Status: Abnormal   Collection Time: 08/14/14  9:15 AM  Result Value Ref Range   WBC 6.8 4.0 - 10.5 K/uL   RBC 3.65 (L) 3.87 - 5.11 MIL/uL   Hemoglobin 13.0 12.0 - 15.0 g/dL   HCT 16.139.2 09.636.0 - 04.546.0 %   MCV 107.4 (H) 78.0 - 100.0 fL   MCH 35.6 (H) 26.0 - 34.0 pg   MCHC 33.2 30.0 - 36.0 g/dL   RDW 40.912.0 81.111.5 - 91.415.5 %   Platelets 238 150 - 400 K/uL  Basic metabolic panel     Status: None   Collection Time: 08/14/14  9:15 AM  Result Value Ref Range   Sodium 140 135 - 145 mmol/L   Potassium 4.3 3.5 - 5.1 mmol/L   Chloride 106 101 - 111 mmol/L   CO2 28 22 - 32 mmol/L   Glucose, Bld 87 65 - 99 mg/dL   BUN 7 6 - 20 mg/dL   Creatinine, Ser 7.820.57 0.44 - 1.00 mg/dL   Calcium 9.2 8.9 - 95.610.3 mg/dL   GFR calc non Af Amer >60 >60 mL/min   GFR calc Af Amer >60 >60 mL/min   Anion gap 6 5 - 15  hCG, serum, qualitative     Status: None   Collection Time: 08/14/14  9:15 AM  Result Value Ref Range   Preg, Serum NEGATIVE NEGATIVE    No results found.  Assessment/Plan: Chronic LLq pain felt attibuted to left tube and ovary Chronic opioid use, suspect opiate tolerant individual S/p hysterectomy and right S&O. S/p left vulvar abscess.  Plan Laparoscopic removal of left tube and ovary.   Possibility of adhesion reqiring open procedure explained to patient.  Chardai Gangemi V 08/14/2014, 10:35 PM

## 2014-08-15 ENCOUNTER — Encounter (HOSPITAL_COMMUNITY)
Admission: RE | Disposition: A | Discharge status: Home / Self Care | Payer: Self-pay | Source: Ambulatory Visit | Attending: Obstetrics and Gynecology

## 2014-08-15 ENCOUNTER — Ambulatory Visit (HOSPITAL_COMMUNITY): Payer: BLUE CROSS/BLUE SHIELD | Admitting: Anesthesiology

## 2014-08-15 ENCOUNTER — Ambulatory Visit (HOSPITAL_COMMUNITY)
Admission: RE | Admit: 2014-08-15 | Discharge: 2014-08-15 | Disposition: A | Discharge status: Home / Self Care | Payer: BLUE CROSS/BLUE SHIELD | Source: Ambulatory Visit | Attending: Obstetrics and Gynecology | Admitting: Obstetrics and Gynecology

## 2014-08-15 DIAGNOSIS — R1032 Left lower quadrant pain: Secondary | ICD-10-CM | POA: Diagnosis not present

## 2014-08-15 DIAGNOSIS — Z90710 Acquired absence of both cervix and uterus: Secondary | ICD-10-CM | POA: Insufficient documentation

## 2014-08-15 DIAGNOSIS — Z79899 Other long term (current) drug therapy: Secondary | ICD-10-CM | POA: Insufficient documentation

## 2014-08-15 DIAGNOSIS — N831 Corpus luteum cyst: Secondary | ICD-10-CM

## 2014-08-15 DIAGNOSIS — G8929 Other chronic pain: Secondary | ICD-10-CM | POA: Insufficient documentation

## 2014-08-15 DIAGNOSIS — N830 Follicular cyst of ovary: Secondary | ICD-10-CM

## 2014-08-15 DIAGNOSIS — F419 Anxiety disorder, unspecified: Secondary | ICD-10-CM | POA: Insufficient documentation

## 2014-08-15 DIAGNOSIS — N838 Other noninflammatory disorders of ovary, fallopian tube and broad ligament: Secondary | ICD-10-CM

## 2014-08-15 DIAGNOSIS — R102 Pelvic and perineal pain: Secondary | ICD-10-CM

## 2014-08-15 DIAGNOSIS — F17210 Nicotine dependence, cigarettes, uncomplicated: Secondary | ICD-10-CM | POA: Insufficient documentation

## 2014-08-15 HISTORY — PX: LAPAROSCOPIC SALPINGO OOPHERECTOMY: SHX5927

## 2014-08-15 SURGERY — SALPINGO-OOPHORECTOMY, LAPAROSCOPIC
Anesthesia: General | Laterality: Left

## 2014-08-15 MED ORDER — SUCCINYLCHOLINE CHLORIDE 20 MG/ML IJ SOLN
INTRAMUSCULAR | Status: DC | PRN
  Administered 2014-08-15: 140 mg via INTRAVENOUS

## 2014-08-15 MED ORDER — MIDAZOLAM HCL 2 MG/2ML IJ SOLN
INTRAMUSCULAR | Status: AC
  Filled 2014-08-15: qty 2

## 2014-08-15 MED ORDER — HYDROMORPHONE HCL 1 MG/ML IJ SOLN
INTRAMUSCULAR | Status: AC
  Filled 2014-08-15: qty 1

## 2014-08-15 MED ORDER — NEOSTIGMINE METHYLSULFATE 10 MG/10ML IV SOLN
INTRAVENOUS | Status: AC
  Filled 2014-08-15: qty 1

## 2014-08-15 MED ORDER — ONDANSETRON HCL 4 MG/2ML IJ SOLN: 4.0000 mg | Freq: Once | INTRAMUSCULAR | Status: DC | PRN

## 2014-08-15 MED ORDER — BUPIVACAINE HCL (PF) 0.5 % IJ SOLN
INTRAMUSCULAR | Status: AC
  Filled 2014-08-15: qty 30

## 2014-08-15 MED ORDER — DEXAMETHASONE SODIUM PHOSPHATE 4 MG/ML IJ SOLN
4.0000 mg | Freq: Once | INTRAMUSCULAR | Status: AC
  Administered 2014-08-15: 4 mg via INTRAVENOUS

## 2014-08-15 MED ORDER — MIDAZOLAM HCL 5 MG/5ML IJ SOLN
INTRAMUSCULAR | Status: DC | PRN
  Administered 2014-08-15: 2 mg via INTRAVENOUS

## 2014-08-15 MED ORDER — SUFENTANIL CITRATE 50 MCG/ML IV SOLN
INTRAVENOUS | Status: DC | PRN
  Administered 2014-08-15 (×2): 5 ug via INTRAVENOUS
  Administered 2014-08-15: 10 ug via INTRAVENOUS
  Administered 2014-08-15 (×2): 5 ug via INTRAVENOUS

## 2014-08-15 MED ORDER — 0.9 % SODIUM CHLORIDE (POUR BTL) OPTIME
TOPICAL | Status: DC | PRN
  Administered 2014-08-15: 1000 mL

## 2014-08-15 MED ORDER — LIDOCAINE HCL 1 % IJ SOLN
INTRAMUSCULAR | Status: DC | PRN
  Administered 2014-08-15: 25 mg via INTRADERMAL

## 2014-08-15 MED ORDER — FENTANYL CITRATE (PF) 250 MCG/5ML IJ SOLN
INTRAMUSCULAR | Status: AC
  Filled 2014-08-15: qty 5

## 2014-08-15 MED ORDER — MIDAZOLAM HCL 2 MG/2ML IJ SOLN
INTRAMUSCULAR | Status: AC
  Filled 2014-08-15: qty 4

## 2014-08-15 MED ORDER — ONDANSETRON HCL 4 MG/2ML IJ SOLN
4.0000 mg | Freq: Once | INTRAMUSCULAR | Status: AC
Start: 2014-08-15 — End: 2014-08-15
  Administered 2014-08-15: 4 mg via INTRAVENOUS

## 2014-08-15 MED ORDER — SUFENTANIL CITRATE 50 MCG/ML IV SOLN
INTRAVENOUS | Status: AC
Start: 2014-08-15 — End: 2014-08-15
  Filled 2014-08-15: qty 1

## 2014-08-15 MED ORDER — ROCURONIUM BROMIDE 50 MG/5ML IV SOLN
INTRAVENOUS | Status: AC
  Filled 2014-08-15: qty 3

## 2014-08-15 MED ORDER — MIDAZOLAM HCL 2 MG/2ML IJ SOLN
1.0000 mg | INTRAMUSCULAR | Status: DC | PRN
  Administered 2014-08-15 (×3): 2 mg via INTRAVENOUS

## 2014-08-15 MED ORDER — ESTRADIOL 1 MG PO TABS: 1.0000 mg | ORAL_TABLET | Freq: Every day | ORAL | Status: DC

## 2014-08-15 MED ORDER — GLYCOPYRROLATE 0.2 MG/ML IJ SOLN
INTRAMUSCULAR | Status: DC | PRN
  Administered 2014-08-15: .5 mg via INTRAVENOUS
  Administered 2014-08-15: .2 mg via INTRAVENOUS

## 2014-08-15 MED ORDER — KETOROLAC TROMETHAMINE 10 MG PO TABS: 10.0000 mg | ORAL_TABLET | Freq: Four times a day (QID) | ORAL | Status: DC | PRN

## 2014-08-15 MED ORDER — DIPHENHYDRAMINE HCL 50 MG/ML IJ SOLN
25.0000 mg | Freq: Once | INTRAMUSCULAR | Status: AC
  Administered 2014-08-15: 25 mg via INTRAVENOUS

## 2014-08-15 MED ORDER — PROPOFOL 10 MG/ML IV BOLUS
INTRAVENOUS | Status: DC | PRN
  Administered 2014-08-15: 150 mg via INTRAVENOUS

## 2014-08-15 MED ORDER — DEXAMETHASONE SODIUM PHOSPHATE 4 MG/ML IJ SOLN
INTRAMUSCULAR | Status: AC
  Filled 2014-08-15: qty 1

## 2014-08-15 MED ORDER — GLYCOPYRROLATE 0.2 MG/ML IJ SOLN
INTRAMUSCULAR | Status: AC
  Filled 2014-08-15: qty 2

## 2014-08-15 MED ORDER — LACTATED RINGERS IV SOLN
INTRAVENOUS | Status: DC
  Administered 2014-08-15: 12:00:00 via INTRAVENOUS
  Administered 2014-08-15: 1000 mL via INTRAVENOUS

## 2014-08-15 MED ORDER — CEFAZOLIN SODIUM-DEXTROSE 2-3 GM-% IV SOLR
INTRAVENOUS | Status: AC
  Filled 2014-08-15: qty 50

## 2014-08-15 MED ORDER — HYDROMORPHONE HCL 1 MG/ML IJ SOLN
0.5000 mg | INTRAMUSCULAR | Status: DC | PRN
  Administered 2014-08-15 (×3): 0.5 mg via INTRAVENOUS

## 2014-08-15 MED ORDER — OXYCODONE-ACETAMINOPHEN 5-325 MG PO TABS: 1.0000 | ORAL_TABLET | ORAL | Status: DC | PRN

## 2014-08-15 MED ORDER — ONDANSETRON HCL 4 MG/2ML IJ SOLN
INTRAMUSCULAR | Status: AC
  Filled 2014-08-15: qty 2

## 2014-08-15 MED ORDER — NEOSTIGMINE METHYLSULFATE 10 MG/10ML IV SOLN
INTRAVENOUS | Status: DC | PRN
  Administered 2014-08-15: 2.5 mg via INTRAVENOUS

## 2014-08-15 MED ORDER — FENTANYL CITRATE (PF) 100 MCG/2ML IJ SOLN: 25.0000 ug | INTRAMUSCULAR | Status: DC | PRN

## 2014-08-15 MED ORDER — PROPOFOL 10 MG/ML IV BOLUS
INTRAVENOUS | Status: AC
  Filled 2014-08-15: qty 20

## 2014-08-15 MED ORDER — CEFAZOLIN SODIUM-DEXTROSE 2-3 GM-% IV SOLR
2.0000 g | INTRAVENOUS | Status: AC
  Administered 2014-08-15: 2 g via INTRAVENOUS

## 2014-08-15 MED ORDER — BUPIVACAINE HCL (PF) 0.5 % IJ SOLN
INTRAMUSCULAR | Status: DC | PRN
  Administered 2014-08-15: 10 mL

## 2014-08-15 MED ORDER — ROCURONIUM BROMIDE 100 MG/10ML IV SOLN
INTRAVENOUS | Status: DC | PRN
  Administered 2014-08-15: 25 mg via INTRAVENOUS
  Administered 2014-08-15: 5 mg via INTRAVENOUS

## 2014-08-15 MED ORDER — DIPHENHYDRAMINE HCL 50 MG/ML IJ SOLN
INTRAMUSCULAR | Status: AC
  Filled 2014-08-15: qty 1

## 2014-08-15 MED ORDER — LIDOCAINE HCL (PF) 1 % IJ SOLN
INTRAMUSCULAR | Status: AC
  Filled 2014-08-15: qty 5

## 2014-08-15 SURGICAL SUPPLY — 52 items
BAG HAMPER (MISCELLANEOUS) ×2 IMPLANT
BAG SPEC RTRVL LRG 6X4 10 (ENDOMECHANICALS) ×1
BANDAID FLEXIBLE 1X3 (GAUZE/BANDAGES/DRESSINGS) ×3 IMPLANT
BLADE SURG SZ11 CARB STEEL (BLADE) ×2 IMPLANT
CLOTH BEACON ORANGE TIMEOUT ST (SAFETY) ×2 IMPLANT
COVER LIGHT HANDLE STERIS (MISCELLANEOUS) ×4 IMPLANT
DURAPREP 26ML APPLICATOR (WOUND CARE) ×2 IMPLANT
ELECT REM PT RETURN 9FT ADLT (ELECTROSURGICAL) ×2
ELECTRODE REM PT RTRN 9FT ADLT (ELECTROSURGICAL) ×1 IMPLANT
FILTER SMOKE EVAC LAPAROSHD (FILTER) ×2 IMPLANT
GLOVE BIO SURGEON STRL SZ 6.5 (GLOVE) ×1 IMPLANT
GLOVE BIOGEL PI IND STRL 7.0 (GLOVE) IMPLANT
GLOVE BIOGEL PI IND STRL 7.5 (GLOVE) IMPLANT
GLOVE BIOGEL PI IND STRL 9 (GLOVE) ×1 IMPLANT
GLOVE BIOGEL PI INDICATOR 7.0 (GLOVE) ×1
GLOVE BIOGEL PI INDICATOR 7.5 (GLOVE) ×1
GLOVE BIOGEL PI INDICATOR 9 (GLOVE) ×1
GLOVE ECLIPSE 9.0 STRL (GLOVE) ×2 IMPLANT
GLOVE EXAM NITRILE MD LF STRL (GLOVE) ×1 IMPLANT
GOWN SPEC L3 XXLG W/TWL (GOWN DISPOSABLE) ×4 IMPLANT
GOWN STRL REUS W/TWL LRG LVL3 (GOWN DISPOSABLE) ×2 IMPLANT
INST SET LAPROSCOPIC GYN AP (KITS) ×2 IMPLANT
KIT ROOM TURNOVER AP CYSTO (KITS) ×2 IMPLANT
LIGASURE 5MM LAPAROSCOPIC (INSTRUMENTS) IMPLANT
MANIFOLD NEPTUNE II (INSTRUMENTS) ×2 IMPLANT
NDL HYPO 18GX1.5 BLUNT FILL (NEEDLE) IMPLANT
NDL HYPO 25X1 1.5 SAFETY (NEEDLE) ×1 IMPLANT
NDL INSUFFLATION 14GA 120MM (NEEDLE) ×1 IMPLANT
NEEDLE HYPO 18GX1.5 BLUNT FILL (NEEDLE) IMPLANT
NEEDLE HYPO 25X1 1.5 SAFETY (NEEDLE) ×2 IMPLANT
NEEDLE INSUFFLATION 14GA 120MM (NEEDLE) ×2 IMPLANT
PACK PERI GYN (CUSTOM PROCEDURE TRAY) ×2 IMPLANT
PAD ARMBOARD 7.5X6 YLW CONV (MISCELLANEOUS) ×2 IMPLANT
POUCH SPECIMEN RETRIEVAL 10MM (ENDOMECHANICALS) ×2 IMPLANT
SCALPEL HARMONIC ACE (MISCELLANEOUS) ×1 IMPLANT
SET BASIN LINEN APH (SET/KITS/TRAYS/PACK) ×2 IMPLANT
SET TUBE IRRIG SUCTION NO TIP (IRRIGATION / IRRIGATOR) ×1 IMPLANT
SLEEVE ENDOPATH XCEL 5M (ENDOMECHANICALS) IMPLANT
SOLUTION ANTI FOG 6CC (MISCELLANEOUS) ×2 IMPLANT
STRIP CLOSURE SKIN 1/4X3 (GAUZE/BANDAGES/DRESSINGS) ×2 IMPLANT
SUT VIC AB 4-0 PS2 27 (SUTURE) ×2 IMPLANT
SUT VICRYL 0 UR6 27IN ABS (SUTURE) ×2 IMPLANT
SYR BULB IRRIGATION 50ML (SYRINGE) ×2 IMPLANT
SYRINGE 10CC LL (SYRINGE) ×2 IMPLANT
TRAY FOLEY CATH SILVER 16FR (SET/KITS/TRAYS/PACK) ×1 IMPLANT
TROCAR ENDO BLADELESS 11MM (ENDOMECHANICALS) ×2 IMPLANT
TROCAR XCEL NON-BLD 5MMX100MML (ENDOMECHANICALS) ×2 IMPLANT
TROCAR XCEL UNIV SLVE 11M 100M (ENDOMECHANICALS) ×1 IMPLANT
TROCAR Z-THAD FIOS HNDL 12X100 (TROCAR) IMPLANT
TUBING DYE INJECTION 900-617 (MISCELLANEOUS) ×2 IMPLANT
TUBING INSUFFLATION (TUBING) ×2 IMPLANT
WARMER LAPAROSCOPE (MISCELLANEOUS) ×2 IMPLANT

## 2014-08-15 NOTE — Transfer of Care (Signed)
Immediate Anesthesia Transfer of Care Note  Patient: [name]  Procedure(s) Performed: Procedure(s): LAPAROSCOPIC LEFT SALPINGO OOPHORECTOMY (Left)  Patient Location: PACU  Anesthesia Type:General  Level of Consciousness: awake and patient cooperative  Airway & Oxygen Therapy: Patient Spontanous Breathing and Patient connected to face mask oxygen  Post-op Assessment: Report given to RN, Post -op Vital signs reviewed and stable and Patient moving all extremities  Post vital signs: Reviewed and stable  Last Vitals:  Filed Vitals:   08/15/14 1120  BP: 101/53  Pulse:   Temp:   Resp: 19    Complications: No apparent anesthesia complications

## 2014-08-15 NOTE — Brief Op Note (Signed)
08/15/2014  12:52 PM  PATIENT:  [name]  34 y.o. female  PRE-OPERATIVE DIAGNOSIS:  Chronic Left Lower Quadrant pain  POST-OPERATIVE DIAGNOSIS:  Chronic Left Lower Quadrant pain  PROCEDURE:  Procedure(s): LAPAROSCOPIC LEFT SALPINGO OOPHORECTOMY (Left)  SURGEON:  Surgeon(s) and Role:    * Tilda BurrowJohn Brailon Don V, MD - Primary  PHYSICIAN ASSISTANT:   ASSISTANTS: [place] CST   ANESTHESIA:   local and general  EBL:  Total I/O In: 1100 [I.V.:1100] Out: 20 [Blood:20]  BLOOD ADMINISTERED:none  DRAINS: none   LOCAL MEDICATIONS USED:  MARCAINE    and Amount: 20 ml  SPECIMEN:  Source of Specimen:  Left tube and ovary  DISPOSITION OF SPECIMEN:  PATHOLOGY  COUNTS:  YES  TOURNIQUET:  * No tourniquets in log *  DICTATION: .Dragon Dictation  PLAN OF CARE: Discharge to home after PACU  PATIENT DISPOSITION:  PACU - hemodynamically stable.   Delay start of Pharmacological VTE agent (>24hrs) due to surgical blood loss or risk of bleeding: not applicable Details of procedure:. Patient was taken operating room prepped and draped for combined abdominal and vaginal procedure, with no catheterization necessary as the patient just voided prior to going to the OR. Fundus take was placed in the vagina after timeout conducted and Ancef administered and surgical procedure confirmed by operative team. The supraumbilical transverse incision was made excising the old navel ring as per the patient's request, and Veress needle used to the umbilicus to achieve pneumoperitoneum under 5-8 mmHg pressure, and then let millimeter laparoscopic trocar non-bladed used for peritoneal entry without difficulty. Inspection abdomen show no evidence of intra-abdominal bleeding or trauma. Suprapubic and right lower quadrant trochars were placed without difficulty. Attention was directed to the pelvis. With patient in Trendelenburg position, the left adnexa could be inspected. The pelvis showed a thin band of adhesions of the  peritoneal reflection from the old hysterectomy, this was noted on both sides left tube and ovary was essentially resting on the pelvic sidewall. Ureters could be visualized bilaterally and were well out of the surgical area. In their retroperitoneal location. With the harmonic scalpel the left tube and ovary were elevated ovalized, with the attachments to the left round ligament taken down, then the infundibulopelvic ligament coagulated and transected and the specimen removed off the pelvic sidewall without difficulty. Pedicles inspected and hemostasis confirmed. On the right side the some thin adhesions of the level of the round ligament to the peritoneal adhesions were sharply dissected free. After confirming that there were no vascular or ureteral structures involved. Under low pressure as the pedicles were inspected and confirmed as hemostatic. 120 cc of saline was instilled in the abdomen after removal of the specimen using an Endo Catch bag through the suprapubic site after deflation the abdomen, removing and removal of all the laparoscopic devices, the suprapubic and umbilical sites were closed at the fascia level by using S retractors to elevate the peritoneal wall, grasped the fascia with Allis clamps, and use 0 Vicryls in a three-quarter circumference suture needle, the fascia closed with good visualization.. Superficial 40 Vicryls closure of all 3 trocar sites were then was performed and the skin infiltrated with Marcaine solution around each trocar site. Sponge and needle counts were correct. Patient to recovery room in good condition with Steri-Strips and Band-Aids in place  there was a small amount of serous fluid from the suprapubic site then represented extravasation of some of the peritoneal irrigation fluid patient will be given Toradol and analgesia is given her  low pain tolerance is incision home as outpatient

## 2014-08-15 NOTE — Op Note (Signed)
see brief operative note for details of procedure

## 2014-08-15 NOTE — Discharge Instructions (Signed)
Diagnostic Laparoscopy Laparoscopy is a surgical procedure. It is used to diagnose and treat diseases inside the belly (abdomen). It is usually a brief, common, and relatively simple procedure. The laparoscopeis a thin, lighted, pencil-sized instrument. It is like a telescope. It is inserted into your abdomen through a small cut (incision). Your caregiver can look at the organs inside your body through this instrument. He or she can see if there is anything abnormal. Laparoscopy can be done either in a hospital or outpatient clinic. You may be given a mild sedative to help you relax before the procedure. Once in the operating room, you will be given a drug to make you sleep (general anesthesia). Laparoscopy usually lasts less than 1 hour. After the procedure, you will be monitored in a recovery area until you are stable and doing well. Once you are home, it will take 2 to 3 days to fully recover. RISKS AND COMPLICATIONS  Laparoscopy has relatively few risks. Your caregiver will discuss the risks with you before the procedure. Some problems that can occur include:  Infection.  Bleeding.  Damage to other organs.  Anesthetic side effects. PROCEDURE Once you receive anesthesia, your surgeon inflates the abdomen with a harmless gas (carbon dioxide). This makes the organs easier to see. The laparoscope is inserted into the abdomen through a small incision. This allows your surgeon to see into the abdomen. Other small instruments are also inserted into the abdomen through other small openings. Many surgeons attach a video camera to the laparoscope to enlarge the view. During a diagnostic laparoscopy, the surgeon may be looking for inflammation, infection, or cancer. Your surgeon may take tissue samples(biopsies). The samples are sent to a specialist in looking at cells and tissue samples (pathologist). The pathologist examines them under a microscope. Biopsies can help to diagnose or confirm a  disease. AFTER THE PROCEDURE   The gas is released from inside the abdomen.  The incisions are closed with stitches (sutures). Because these incisions are small (usually less than 1/2 inch), there is usually minimal discomfort after the procedure. There may be some mild discomfort in the throat. This is from the tube placed in the throat while you were sleeping. You may have some mild abdominal discomfort. There may also be discomfort from the instrument placement incisions in the abdomen.  The recovery time is shortened as long as there are no complications.  You will rest in a recovery room until stable and doing well. As long as there are no complications, you may be allowed to go home. FINDING OUT THE RESULTS OF YOUR TEST Not all test results are available during your visit. If your test results are not back during the visit, make an appointment with your caregiver to find out the results. Do not assume everything is normal if you have not heard from your caregiver or the medical facility. It is important for you to follow up on all of your test results. HOME CARE INSTRUCTIONS   Take all medicines as directed.  Only take over-the-counter or prescription medicines for pain, discomfort, or fever as directed by your caregiver.  Resume daily activities as directed.  Showers are preferred over baths.  You may resume sexual activities in 1 week or as directed.  Do not drive while taking narcotics. SEEK MEDICAL CARE IF:   There is increasing abdominal pain.  There is new pain in the shoulders (shoulder strap areas).  You feel lightheaded or faint.  You have the chills.  You or  your child has an oral temperature above 102 F (38.9 C).  There is pus-like (purulent) drainage from any of the wounds.  You are unable to pass gas or have a bowel movement.  You feel sick to your stomach (nauseous) or throw up (vomit). MAKE SURE YOU:   Understand these instructions.  Will watch  your condition.  Will get help right away if you are not doing well or get worse. Document Released: 06/02/2000 Document Revised: 06/21/2012 Document Reviewed: 02/24/2007 [facility] Patient Information 2015 [place], [place]. This information is not intended to replace advice given to you by your health care provider. Make sure you discuss any questions you have with your health care provider. Unilateral Salpingo-Oophorectomy, Care After Refer to this sheet in the next few weeks. These instructions provide you with information on caring for yourself after your procedure. Your health care provider may also give you more specific instructions. Your treatment has been planned according to current medical practices, but problems sometimes occur. Call your health care provider if you have any problems or questions after your procedure. WHAT TO EXPECT AFTER THE PROCEDURE After your procedure, it is typical to have the following:  Abdominal pain that can be controlled with pain medicine.  Vaginal spotting.  Constipation. HOME CARE INSTRUCTIONS   Get plenty of rest and sleep.  Only take over-the-counter or prescription medicines as directed by your health care provider. Do not take aspirin. It can cause bleeding.  Keep incision areas clean and dry. Remove or change any bandages (dressings) only as directed by your health care provider.  Follow your health care provider's advice regarding diet.  Drink enough fluids to keep your urine clear or pale yellow.  Limit exercise and activities as directed by your health care provider. Do not lift anything heavier than 5 pounds (2.3 kg) until your health care provider approves.  Do not drive until your health care provider approves.  Do not drink alcohol until your health care provider approves.  Do not have sexual intercourse until your health care provider says it is OK.  Take your temperature twice a day and write it down.  If you become  constipated, you may:  Ask your health care provider about taking a mild laxative.  Add more fruit and bran to your diet.  Drink more fluids.  Follow up with your health care provider as directed. SEEK MEDICAL CARE IF:   You have swelling or redness in the incision area.  You develop a rash.  You feel lightheaded.  You have pain that is not controlled with medicine.  You have pain, swelling, or redness where the IV access tube was placed. SEEK IMMEDIATE MEDICAL CARE IF:  You have a fever.  You develop increasing abdominal pain.  You see pus coming out of the incision, or the incision is separating.  You notice a bad smell coming from the wound or dressing.  You have excessive vaginal bleeding.  You feel sick to your stomach (nauseous) and vomit.  You have leg or chest pain.  You have pain when you urinate.  You develop shortness of breath.  You pass out. Document Released: 12/21/2008 Document Revised: 12/15/2012 Document Reviewed: 08/18/2012 [facility] Patient Information 2015 [place]ExitCare, [place]LL[place]. This information is not intended to replace advice given to you by your health care provider. Make sure you discuss any questions you have with your health care provider.

## 2014-08-15 NOTE — Anesthesia Preprocedure Evaluation (Signed)
Anesthesia Evaluation  Patient identified by MRN, date of birth, ID band Patient awake    Reviewed: Allergy & Precautions, NPO status , Patient's Chart, lab work & pertinent test results  Airway Mallampati: I  TM Distance: >3 FB     Dental  (+) Poor Dentition, Dental Advisory Given, Chipped   Pulmonary Current Smoker,  breath sounds clear to auscultation        Cardiovascular negative cardio ROS  Rhythm:Regular Rate:Normal     Neuro/Psych  Headaches, PSYCHIATRIC DISORDERS Anxiety    GI/Hepatic negative GI ROS, (+)     substance abuse  cocaine use,   Endo/Other    Renal/GU      Musculoskeletal   Abdominal   Peds  Hematology   Anesthesia Other Findings Hx chronic narcotic use for back pain. Fentanyl IV causes pruritis.  Reproductive/Obstetrics                             Anesthesia Physical Anesthesia Plan  ASA: II  Anesthesia Plan: General   Post-op Pain Management:    Induction: Intravenous, Rapid sequence and Cricoid pressure planned  Airway Management Planned: Oral ETT  Additional Equipment:   Intra-op Plan:   Post-operative Plan: Extubation in OR  Informed Consent: I have reviewed the patients History and Physical, chart, labs and discussed the procedure including the risks, benefits and alternatives for the proposed anesthesia with the patient or authorized representative who has indicated his/her understanding and acceptance.     Plan Discussed with:   Anesthesia Plan Comments: (Sufenta intraop due to fentanyl allergy.)        Anesthesia Quick Evaluation

## 2014-08-15 NOTE — Anesthesia Procedure Notes (Signed)
Procedure Name: Intubation Date/Time: 08/15/2014 11:37 AM Performed by: Despina HiddenIDACAVAGE, Zarin Knupp J Pre-anesthesia Checklist: Suction available, Patient being monitored, Emergency Drugs available and Patient identified Patient Re-evaluated:Patient Re-evaluated prior to inductionOxygen Delivery Method: Circle system utilized Preoxygenation: Pre-oxygenation with 100% oxygen Intubation Type: IV induction, Rapid sequence and Cricoid Pressure applied Ventilation: Mask ventilation without difficulty and Oral airway inserted - appropriate to patient size Laryngoscope Size: Mac and 3 Grade View: Grade I Tube type: Oral Tube size: 7.0 mm Number of attempts: 1 Airway Equipment and Method: Stylet Placement Confirmation: ETT inserted through vocal cords under direct vision,  positive ETCO2 and breath sounds checked- equal and bilateral Secured at: 22 cm Tube secured with: Tape Dental Injury: Teeth and Oropharynx as per pre-operative assessment

## 2014-08-15 NOTE — Interval H&P Note (Signed)
History and Physical Interval Note:  08/15/2014 10:53 AM  [name]  has presented today for surgery, with the diagnosis of left ovarian cyst vulvar abscess  The various methods of treatment have been discussed with the patient and family. After consideration of risks, benefits and other options for treatment, the patient has consented to  Procedure(s): LAPAROSCOPIC SALPINGO OOPHORECTOMY (Bilateral) as a surgical intervention .  The patient's history has been reviewed, patient examined, no change in status, stable for surgery.  I have reviewed the patient's chart and labs.  Questions were answered to the patient's satisfaction.  The patient has reminded me to go thru the prior supraumbilical scar from her Lap Choley, and thus excise the remnants of a prior belly button ring, as we discussed at the time of the preop decisionmaking.   Tilda BurrowFERGUSON,Rosina Cressler V

## 2014-08-15 NOTE — Anesthesia Postprocedure Evaluation (Signed)
  Anesthesia Post-op Note  Patient: [name]  Procedure(s) Performed: Procedure(s): LAPAROSCOPIC LEFT SALPINGO OOPHORECTOMY (Left)  Patient Location: PACU  Anesthesia Type:General  Level of Consciousness: awake, alert , oriented and patient cooperative  Airway and Oxygen Therapy: Patient Spontanous Breathing  Post-op Pain: none  Post-op Assessment: Post-op Vital signs reviewed, Patient's Cardiovascular Status Stable, Respiratory Function Stable, Patent Airway, No signs of Nausea or vomiting and Pain level controlled  Post-op Vital Signs: Reviewed and stable  Last Vitals:  Filed Vitals:   08/15/14 1120  BP: 101/53  Pulse:   Temp:   Resp: 19    Complications: No apparent anesthesia complications

## 2014-08-16 ENCOUNTER — Encounter (HOSPITAL_COMMUNITY): Payer: Self-pay | Admitting: Obstetrics and Gynecology

## 2014-08-17 ENCOUNTER — Other Ambulatory Visit (HOSPITAL_COMMUNITY): Payer: Self-pay

## 2014-08-23 ENCOUNTER — Encounter: Payer: Self-pay | Admitting: Obstetrics and Gynecology

## 2014-08-23 ENCOUNTER — Ambulatory Visit (INDEPENDENT_AMBULATORY_CARE_PROVIDER_SITE_OTHER): Payer: BLUE CROSS/BLUE SHIELD | Admitting: Obstetrics and Gynecology

## 2014-08-23 VITALS — BP 90/56 | Ht 61.0 in | Wt 114.0 lb

## 2014-08-23 DIAGNOSIS — F1190 Opioid use, unspecified, uncomplicated: Secondary | ICD-10-CM

## 2014-08-23 DIAGNOSIS — G8918 Other acute postprocedural pain: Secondary | ICD-10-CM

## 2014-08-23 MED ORDER — HYDROCODONE-ACETAMINOPHEN 5-325 MG PO TABS: 1.0000 | ORAL_TABLET | Freq: Four times a day (QID) | ORAL | Status: DC | PRN

## 2014-08-23 NOTE — Progress Notes (Signed)
Patient ID: [name], female   DOB: 03-19-1980, 34 y.o.   MRN: [id number]    Subjective:  [name] is a 34 y.o. female now 1 weeks status post left s&Ooophorectomy. Pt was given a copy of pictures from the surgery to assure her of the normalcy of the exam. At the time of the surgery, the pt had 3 clips noted on the right side.near the right side of area of hysterectomy.pt reports pain was previously a twisty achy pain on left, and describes a slight tenderness on the left that is lesser, and not as 'twisty" as in the past.Pt here today for post op visit and medication refill. Pt was worked in for the medication refill but had a post op visit scheduled for tomorrow so we cancelled that appointment and will see her for both today. Pt states that she is still having a lot of soreness now. Pt can't take motrin without stomach hurting, and tylenol doesn't help. Pt states that she is having a hard time sleeping and doesn't want to do anything. Pt rates her pain a 4-5 on a scale from 0-10 today.    pt with gradual recovery, and used all 30 of opiates percocet 5 over 1 wk. Last one 2 dayago.  Review of Systems Negative except none r diet:   reg   Bowel movements : normal.  Pain is controlled with current analgesics. Medications being used: prescription NSAID's including ketorolac (Toradol). Pt didn't use toradol, didn't use vicodin and used percocet She initially alleged the toradol and vicodin were "thrown away" but on further questioning acknowledged that theres a distinct possibility that her mother took them  Objective:  BP 90/56 mmHg  Ht 5\' 1"  (1.549 m)  Wt 114 lb (51.71 kg)  BMI 21.55 kg/m2 General:Well developed, well nourished.  No acute distress. Abdomen: Bowel sounds normal, soft, non-tender. Well healed incisions, slight bruising at the umbilicus Pelvic Exam:    External Genitalia:  Normal.    Vagina: Normal    Cervix: surgically absent    Uterus: absent. Cuff is perceived at tender, in  a dull way by pt.     Adnexa/Bimanual: Normal no masses, pt hold tummy after pelvic .    Incision(s):   Healing well , pt still seking pain meds more than average., no drainage, no erythema, no hernin no swelling, no dehiscence,     Assessment:  Post-Op 1 weeks s/p laparoscopy with LSO   low pain tolerances Doing well postoperatively.   Plan:  1.Wound care discussed  Pt expects  2. . current medications.estradiol 3. Activity restrictions: none 4. return to work: not applicable. 5. Follow up in 4 week final vist .

## 2014-08-24 ENCOUNTER — Encounter: Payer: BLUE CROSS/BLUE SHIELD | Admitting: Obstetrics and Gynecology

## 2014-08-25 ENCOUNTER — Encounter: Payer: BLUE CROSS/BLUE SHIELD | Admitting: Obstetrics and Gynecology

## 2014-08-30 ENCOUNTER — Encounter: Payer: BLUE CROSS/BLUE SHIELD | Admitting: Obstetrics and Gynecology

## 2014-09-12 ENCOUNTER — Emergency Department (HOSPITAL_COMMUNITY)
Admission: EM | Admit: 2014-09-12 | Discharge: 2014-09-12 | Discharge status: Against Medical Advice | Payer: BLUE CROSS/BLUE SHIELD | Attending: Emergency Medicine | Admitting: Emergency Medicine

## 2014-09-12 ENCOUNTER — Encounter (HOSPITAL_COMMUNITY): Payer: Self-pay | Admitting: Emergency Medicine

## 2014-09-12 DIAGNOSIS — Z8742 Personal history of other diseases of the female genital tract: Secondary | ICD-10-CM | POA: Insufficient documentation

## 2014-09-12 DIAGNOSIS — L550 Sunburn of first degree: Secondary | ICD-10-CM

## 2014-09-12 DIAGNOSIS — Z8669 Personal history of other diseases of the nervous system and sense organs: Secondary | ICD-10-CM | POA: Diagnosis not present

## 2014-09-12 DIAGNOSIS — Z79899 Other long term (current) drug therapy: Secondary | ICD-10-CM | POA: Insufficient documentation

## 2014-09-12 DIAGNOSIS — Z720 Tobacco use: Secondary | ICD-10-CM | POA: Diagnosis not present

## 2014-09-12 DIAGNOSIS — E860 Dehydration: Secondary | ICD-10-CM | POA: Diagnosis not present

## 2014-09-12 DIAGNOSIS — E876 Hypokalemia: Secondary | ICD-10-CM | POA: Diagnosis not present

## 2014-09-12 DIAGNOSIS — F419 Anxiety disorder, unspecified: Secondary | ICD-10-CM | POA: Insufficient documentation

## 2014-09-12 DIAGNOSIS — Z87448 Personal history of other diseases of urinary system: Secondary | ICD-10-CM | POA: Diagnosis not present

## 2014-09-12 DIAGNOSIS — Z793 Long term (current) use of hormonal contraceptives: Secondary | ICD-10-CM | POA: Insufficient documentation

## 2014-09-12 DIAGNOSIS — R112 Nausea with vomiting, unspecified: Secondary | ICD-10-CM

## 2014-09-12 DIAGNOSIS — R011 Cardiac murmur, unspecified: Secondary | ICD-10-CM | POA: Diagnosis not present

## 2014-09-12 DIAGNOSIS — Z8701 Personal history of pneumonia (recurrent): Secondary | ICD-10-CM | POA: Diagnosis not present

## 2014-09-12 DIAGNOSIS — G8929 Other chronic pain: Secondary | ICD-10-CM | POA: Insufficient documentation

## 2014-09-12 LAB — CBC WITH DIFFERENTIAL/PLATELET
Basophils Absolute: 0 10*3/uL (ref 0.0–0.1)
Basophils Relative: 0 % (ref 0–1)
EOS ABS: 0.2 10*3/uL (ref 0.0–0.7)
Eosinophils Relative: 2 % (ref 0–5)
HCT: 40.8 % (ref 36.0–46.0)
Hemoglobin: 14 g/dL (ref 12.0–15.0)
LYMPHS ABS: 4.9 10*3/uL — AB (ref 0.7–4.0)
Lymphocytes Relative: 40 % (ref 12–46)
MCH: 35.3 pg — ABNORMAL HIGH (ref 26.0–34.0)
MCHC: 34.3 g/dL (ref 30.0–36.0)
MCV: 102.8 fL — ABNORMAL HIGH (ref 78.0–100.0)
Monocytes Absolute: 1 10*3/uL (ref 0.1–1.0)
Monocytes Relative: 8 % (ref 3–12)
NEUTROS ABS: 6.2 10*3/uL (ref 1.7–7.7)
NEUTROS PCT: 50 % (ref 43–77)
PLATELETS: 245 10*3/uL (ref 150–400)
RBC: 3.97 MIL/uL (ref 3.87–5.11)
RDW: 11.7 % (ref 11.5–15.5)
WBC: 12.3 10*3/uL — AB (ref 4.0–10.5)

## 2014-09-12 LAB — BASIC METABOLIC PANEL
Anion gap: 10 (ref 5–15)
CALCIUM: 9 mg/dL (ref 8.9–10.3)
CO2: 26 mmol/L (ref 22–32)
CREATININE: 0.67 mg/dL (ref 0.44–1.00)
Chloride: 105 mmol/L (ref 101–111)
Glucose, Bld: 96 mg/dL (ref 65–99)
POTASSIUM: 2.8 mmol/L — AB (ref 3.5–5.1)
SODIUM: 141 mmol/L (ref 135–145)

## 2014-09-12 MED ORDER — POTASSIUM CHLORIDE 10 MEQ/100ML IV SOLN
10.0000 meq | INTRAVENOUS | Status: DC
Start: 2014-09-12 — End: 2014-09-12

## 2014-09-12 MED ORDER — SODIUM CHLORIDE 0.9 % IV SOLN
1000.0000 mL | Freq: Once | INTRAVENOUS | Status: AC
  Administered 2014-09-12: 1000 mL via INTRAVENOUS

## 2014-09-12 MED ORDER — METOCLOPRAMIDE HCL 5 MG/ML IJ SOLN
10.0000 mg | Freq: Once | INTRAMUSCULAR | Status: AC
  Administered 2014-09-12: 10 mg via INTRAVENOUS
  Filled 2014-09-12: qty 2

## 2014-09-12 MED ORDER — KETOROLAC TROMETHAMINE 30 MG/ML IJ SOLN
30.0000 mg | Freq: Once | INTRAMUSCULAR | Status: AC
  Administered 2014-09-12: 30 mg via INTRAVENOUS
  Filled 2014-09-12: qty 1

## 2014-09-12 MED ORDER — LORAZEPAM 2 MG/ML IJ SOLN
0.5000 mg | Freq: Once | INTRAMUSCULAR | Status: AC
  Administered 2014-09-12: 0.5 mg via INTRAVENOUS
  Filled 2014-09-12: qty 1

## 2014-09-12 MED ORDER — DIPHENHYDRAMINE HCL 50 MG/ML IJ SOLN
25.0000 mg | Freq: Once | INTRAMUSCULAR | Status: AC
  Administered 2014-09-12: 25 mg via INTRAVENOUS
  Filled 2014-09-12: qty 1

## 2014-09-12 MED ORDER — SODIUM CHLORIDE 0.9 % IV SOLN: 1000.0000 mL | INTRAVENOUS | Status: DC

## 2014-09-12 NOTE — ED Notes (Signed)
Patient ambulatory to and from restroom.

## 2014-09-12 NOTE — ED Notes (Signed)
Went into patients room to re-evaluate patients condition. Patient was not found in room, IV had been removed from patient, and fluid wasted on the bed/floor. Patient not found in emergency department. EDP notified

## 2014-09-12 NOTE — ED Notes (Signed)
Patient left ED without being discharged.
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
## 2014-09-12 NOTE — ED Notes (Signed)
Pt c/o vomiting that started this am and states she got sunburn yesterday.

## 2014-09-20 ENCOUNTER — Ambulatory Visit: Payer: BLUE CROSS/BLUE SHIELD | Admitting: Obstetrics and Gynecology

## 2014-09-30 ENCOUNTER — Emergency Department (HOSPITAL_COMMUNITY)
Admission: EM | Admit: 2014-09-30 | Discharge: 2014-09-30 | Disposition: A | Discharge status: Home / Self Care | Payer: BLUE CROSS/BLUE SHIELD | Attending: Emergency Medicine | Admitting: Emergency Medicine

## 2014-09-30 ENCOUNTER — Emergency Department (HOSPITAL_COMMUNITY): Payer: BLUE CROSS/BLUE SHIELD

## 2014-09-30 ENCOUNTER — Encounter (HOSPITAL_COMMUNITY): Payer: Self-pay | Admitting: *Deleted

## 2014-09-30 DIAGNOSIS — S8002XA Contusion of left knee, initial encounter: Secondary | ICD-10-CM | POA: Insufficient documentation

## 2014-09-30 DIAGNOSIS — F419 Anxiety disorder, unspecified: Secondary | ICD-10-CM | POA: Diagnosis not present

## 2014-09-30 DIAGNOSIS — Y9389 Activity, other specified: Secondary | ICD-10-CM | POA: Diagnosis not present

## 2014-09-30 DIAGNOSIS — Z720 Tobacco use: Secondary | ICD-10-CM | POA: Insufficient documentation

## 2014-09-30 DIAGNOSIS — Z87448 Personal history of other diseases of urinary system: Secondary | ICD-10-CM | POA: Insufficient documentation

## 2014-09-30 DIAGNOSIS — Z7982 Long term (current) use of aspirin: Secondary | ICD-10-CM | POA: Diagnosis not present

## 2014-09-30 DIAGNOSIS — W2111XA Struck by baseball bat, initial encounter: Secondary | ICD-10-CM | POA: Insufficient documentation

## 2014-09-30 DIAGNOSIS — R011 Cardiac murmur, unspecified: Secondary | ICD-10-CM | POA: Diagnosis not present

## 2014-09-30 DIAGNOSIS — Z8701 Personal history of pneumonia (recurrent): Secondary | ICD-10-CM | POA: Insufficient documentation

## 2014-09-30 DIAGNOSIS — Z8639 Personal history of other endocrine, nutritional and metabolic disease: Secondary | ICD-10-CM | POA: Insufficient documentation

## 2014-09-30 DIAGNOSIS — Y998 Other external cause status: Secondary | ICD-10-CM | POA: Insufficient documentation

## 2014-09-30 DIAGNOSIS — Z79899 Other long term (current) drug therapy: Secondary | ICD-10-CM | POA: Diagnosis not present

## 2014-09-30 DIAGNOSIS — Y9289 Other specified places as the place of occurrence of the external cause: Secondary | ICD-10-CM | POA: Insufficient documentation

## 2014-09-30 DIAGNOSIS — Z8742 Personal history of other diseases of the female genital tract: Secondary | ICD-10-CM | POA: Diagnosis not present

## 2014-09-30 DIAGNOSIS — G8929 Other chronic pain: Secondary | ICD-10-CM | POA: Insufficient documentation

## 2014-09-30 DIAGNOSIS — S8991XA Unspecified injury of right lower leg, initial encounter: Secondary | ICD-10-CM | POA: Diagnosis present

## 2014-09-30 MED ORDER — PROMETHAZINE HCL 12.5 MG PO TABS
12.5000 mg | ORAL_TABLET | Freq: Once | ORAL | Status: AC
  Administered 2014-09-30: 12.5 mg via ORAL
  Filled 2014-09-30: qty 1

## 2014-09-30 MED ORDER — HYDROCODONE-ACETAMINOPHEN 5-325 MG PO TABS
2.0000 | ORAL_TABLET | Freq: Once | ORAL | Status: AC
  Administered 2014-09-30: 2 via ORAL
  Filled 2014-09-30: qty 2

## 2014-09-30 NOTE — Discharge Instructions (Signed)
Your x-rays are negative for fracture or dislocation. Your examination is consistent with deep bruise to the anterior knee. Please continue to apply ice, please use the Ace wrap for the next 4 days. Please use crutches until you can safely apply weight to your left lower extremity. Please see Dr. Romeo Apple for additional evaluation of not improving in the next 4 days.  Contusion A contusion is a deep bruise. Contusions happen when an injury causes bleeding under the skin. Signs of bruising include pain, puffiness (swelling), and discolored skin. The contusion may turn blue, purple, or yellow. HOME CARE   Put ice on the injured area.  Put ice in a plastic bag.  Place a towel between your skin and the bag.  Leave the ice on for 15-20 minutes, 03-04 times a day.  Only take medicine as told by your doctor.  Rest the injured area.  If possible, raise (elevate) the injured area to lessen puffiness. GET HELP RIGHT AWAY IF:   You have more bruising or puffiness.  You have pain that is getting worse.  Your puffiness or pain is not helped by medicine. MAKE SURE YOU:   Understand these instructions.  Will watch your condition.  Will get help right away if you are not doing well or get worse. Document Released: 08/13/2007 Document Revised: 05/19/2011 Document Reviewed: 12/30/2010 [facility] Patient Information 2015 [place], [place]. This information is not intended to replace advice given to you by your health care provider. Make sure you discuss any questions you have with your health care provider.

## 2014-09-30 NOTE — ED Notes (Signed)
Pt does not want set of crutches at this time. States she has some at home she can use.
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
## 2014-10-02 ENCOUNTER — Encounter: Payer: Self-pay | Admitting: Obstetrics and Gynecology

## 2014-10-02 ENCOUNTER — Ambulatory Visit: Payer: BLUE CROSS/BLUE SHIELD | Admitting: Obstetrics and Gynecology

## 2014-10-25 ENCOUNTER — Emergency Department (HOSPITAL_COMMUNITY)
Admission: EM | Admit: 2014-10-25 | Discharge: 2014-10-25 | Discharge status: Against Medical Advice | Payer: BLUE CROSS/BLUE SHIELD | Attending: Physician Assistant | Admitting: Physician Assistant

## 2014-10-25 ENCOUNTER — Encounter (HOSPITAL_COMMUNITY): Payer: Self-pay | Admitting: Emergency Medicine

## 2014-10-25 ENCOUNTER — Emergency Department (HOSPITAL_COMMUNITY): Payer: BLUE CROSS/BLUE SHIELD

## 2014-10-25 DIAGNOSIS — R109 Unspecified abdominal pain: Secondary | ICD-10-CM

## 2014-10-25 DIAGNOSIS — Z8701 Personal history of pneumonia (recurrent): Secondary | ICD-10-CM | POA: Insufficient documentation

## 2014-10-25 DIAGNOSIS — M549 Dorsalgia, unspecified: Secondary | ICD-10-CM | POA: Diagnosis not present

## 2014-10-25 DIAGNOSIS — R011 Cardiac murmur, unspecified: Secondary | ICD-10-CM | POA: Insufficient documentation

## 2014-10-25 DIAGNOSIS — G8929 Other chronic pain: Secondary | ICD-10-CM | POA: Diagnosis not present

## 2014-10-25 DIAGNOSIS — Z9889 Other specified postprocedural states: Secondary | ICD-10-CM | POA: Diagnosis not present

## 2014-10-25 DIAGNOSIS — Z720 Tobacco use: Secondary | ICD-10-CM | POA: Diagnosis not present

## 2014-10-25 DIAGNOSIS — R1031 Right lower quadrant pain: Secondary | ICD-10-CM | POA: Insufficient documentation

## 2014-10-25 DIAGNOSIS — Z87448 Personal history of other diseases of urinary system: Secondary | ICD-10-CM | POA: Diagnosis not present

## 2014-10-25 DIAGNOSIS — F419 Anxiety disorder, unspecified: Secondary | ICD-10-CM | POA: Insufficient documentation

## 2014-10-25 DIAGNOSIS — Z79899 Other long term (current) drug therapy: Secondary | ICD-10-CM | POA: Insufficient documentation

## 2014-10-25 DIAGNOSIS — Z8742 Personal history of other diseases of the female genital tract: Secondary | ICD-10-CM | POA: Diagnosis not present

## 2014-10-25 DIAGNOSIS — R112 Nausea with vomiting, unspecified: Secondary | ICD-10-CM | POA: Diagnosis not present

## 2014-10-25 LAB — CBC WITH DIFFERENTIAL/PLATELET
BASOS ABS: 0 10*3/uL (ref 0.0–0.1)
Basophils Relative: 1 % (ref 0–1)
EOS ABS: 0.2 10*3/uL (ref 0.0–0.7)
EOS PCT: 3 % (ref 0–5)
HCT: 38.2 % (ref 36.0–46.0)
Hemoglobin: 12.9 g/dL (ref 12.0–15.0)
Lymphocytes Relative: 39 % (ref 12–46)
Lymphs Abs: 3.1 10*3/uL (ref 0.7–4.0)
MCH: 34.6 pg — ABNORMAL HIGH (ref 26.0–34.0)
MCHC: 33.8 g/dL (ref 30.0–36.0)
MCV: 102.4 fL — ABNORMAL HIGH (ref 78.0–100.0)
MONO ABS: 0.4 10*3/uL (ref 0.1–1.0)
Monocytes Relative: 5 % (ref 3–12)
Neutro Abs: 4.2 10*3/uL (ref 1.7–7.7)
Neutrophils Relative %: 52 % (ref 43–77)
PLATELETS: 224 10*3/uL (ref 150–400)
RBC: 3.73 MIL/uL — ABNORMAL LOW (ref 3.87–5.11)
RDW: 11.8 % (ref 11.5–15.5)
WBC: 7.9 10*3/uL (ref 4.0–10.5)

## 2014-10-25 LAB — COMPREHENSIVE METABOLIC PANEL
ALBUMIN: 3.7 g/dL (ref 3.5–5.0)
ALT: 34 U/L (ref 14–54)
ANION GAP: 7 (ref 5–15)
AST: 25 U/L (ref 15–41)
Alkaline Phosphatase: 87 U/L (ref 38–126)
BUN: 7 mg/dL (ref 6–20)
CHLORIDE: 104 mmol/L (ref 101–111)
CO2: 28 mmol/L (ref 22–32)
Calcium: 8.3 mg/dL — ABNORMAL LOW (ref 8.9–10.3)
Creatinine, Ser: 0.73 mg/dL (ref 0.44–1.00)
GFR calc Af Amer: >60 mL/min (ref >60)
GFR calc non Af Amer: >60 mL/min (ref >60)
GLUCOSE: 89 mg/dL (ref 65–99)
POTASSIUM: 3.1 mmol/L — AB (ref 3.5–5.1)
SODIUM: 139 mmol/L (ref 135–145)
Total Bilirubin: 0.8 mg/dL (ref 0.3–1.2)
Total Protein: 6.5 g/dL (ref 6.5–8.1)

## 2014-10-25 LAB — LIPASE, BLOOD: LIPASE: 10 U/L — AB (ref 22–51)

## 2014-10-25 MED ORDER — SODIUM CHLORIDE 0.9 % IV BOLUS (SEPSIS)
1000.0000 mL | Freq: Once | INTRAVENOUS | Status: AC
  Administered 2014-10-25: 1000 mL via INTRAVENOUS

## 2014-10-25 MED ORDER — IOHEXOL 300 MG/ML  SOLN
50.0000 mL | Freq: Once | INTRAMUSCULAR | Status: AC | PRN
  Administered 2014-10-25: 50 mL via ORAL

## 2014-10-25 MED ORDER — DIPHENHYDRAMINE HCL 25 MG PO CAPS
25.0000 mg | ORAL_CAPSULE | Freq: Once | ORAL | Status: AC
  Administered 2014-10-25: 25 mg via ORAL
  Filled 2014-10-25: qty 1

## 2014-10-25 MED ORDER — MORPHINE SULFATE (PF) 2 MG/ML IV SOLN
2.0000 mg | Freq: Once | INTRAVENOUS | Status: AC
  Administered 2014-10-25: 2 mg via INTRAVENOUS
  Filled 2014-10-25: qty 1

## 2014-10-25 NOTE — ED Notes (Signed)
Pt made aware to return if symptoms worsen or if any life threatening symptoms occur.  Pt verbalized understanding that she is not to drive with medication given at this visit.

## 2014-10-25 NOTE — ED Notes (Signed)
Pt raising voice stating "that doctor is a damn bitch with no bedside manner, I just want to go to [place]. I would like to request a different doctor and if I dont get a new one I am going to rip this shit off of me and go to [place]."  Statement reported to Dr. Corlis Leak.

## 2014-10-25 NOTE — ED Notes (Signed)
Lower right quadrant pain x1 hour with vomiting starting 4 hours ago and has not vomited since.  Pt also reports lower back pain and migraine headache.    Pt alert and oriented 90/50, 76, 94

## 2014-10-25 NOTE — ED Notes (Signed)
Dr. Corlis Leak at bedside with nurse discussing with pt why she would like to leave.  Pt reports that she does not want to drink the contrast because it makes her stomach hurt.  MD notified pt of benefits of having contrast.  Pt states that she refuses to drink it.  Pt states again "I just want to leave and go to [place]." Dr. Corlis Leak discusses risks or leaving AMA.  Pt verbalizes understanding.

## 2014-10-25 NOTE — ED Notes (Signed)
IV removed by nurse, Pt standing beside bed calling sister to come pick her up.

## 2014-10-25 NOTE — ED Notes (Addendum)
Notified MD that pt was itching.  Requesting benedryl

## 2014-10-25 NOTE — ED Notes (Signed)
Notified Dr. Corlis Leak that pt is questioning re-order of morphine due to itching.  Dr. Corlis Leak at bedside explaining to pt that she feels most comfortable with giving pt morphine and nothing else because pt is allergic to other pain medication.  Pt requesting dilaudid.  Pt verbalized that she would take the morphine.

## 2014-10-25 NOTE — ED Provider Notes (Signed)
CSN: [id number]     Arrival date & time 10/25/14  1543 History   First MD Initiated Contact with Patient 10/25/14 1556     Chief Complaint  Patient presents with  . Abdominal Pain     (Consider location/radiation/quality/duration/timing/severity/associated sxs/prior Treatment) HPI   Patient is a 34 old female with documented narcotic abuse presenting today with abdominal pain. Patient's had multiple surgeries in her abdomen for chronic pain. She's now had a hysterectomy and bilateral oophorectomy. Patient is here today with right lower quadrant pain starting 2 weeks ago that's been getting worse. Patient also reports right back pain and headache. Patient vomited one time. No diarrhea. No pain with urination  Past Medical History  Diagnosis Date  . Hypokalemia   . Narcotic abuse     5 yrs ago lortab, off few yrs  . Anxiety   . Chronic pain   . Endometriosis   . Chronic headache   . Chronic back pain   . Chronic pelvic pain in female   . Heart murmur     as a child  . Pneumonia     34 years  old  . Hx of opioid abuse   . Renal disorder Cyst on Kidney   Past Surgical History  Procedure Laterality Date  . Tonsillectomy    . Cholecystectomy  2010  . Tubal ligation    . Carpal tunnel release      both  . Incision and drainage abscess N/A 07/14/2014    Procedure: INCISION AND DRAINAGE VULVAR ABSCESS;  Surgeon: [name], MD;  Location: [facility];  Service: Gynecology;  Laterality: N/A;  . Abdominal hysterectomy  2007    left ovary remains  . Laparoscopic salpingo oopherectomy Left 08/15/2014    Procedure: LAPAROSCOPIC LEFT SALPINGO OOPHORECTOMY;  Surgeon: [name], MD;  Location: [facility];  Service: Gynecology;  Laterality: Left;   Family History  Problem Relation Age of Onset  . Stroke Father   . Diverticulitis Father   . Heart disease    . Arthritis    . Diabetes    . Kidney disease    . Hypertension Mother   . Diabetes Mother    Social History  Substance Use  Topics  . Smoking status: Current Every Day Smoker -- 0.50 packs/day for 18 years    Types: Cigarettes  . Smokeless tobacco: Never Used  . Alcohol Use: No     Comment: rarely   OB History    Gravida Para Term Preterm AB TAB SAB Ectopic Multiple Living   4 4 4       4      Review of Systems  Constitutional: Negative for fever and activity change.  HENT: Negative for congestion.   Respiratory: Negative for shortness of breath.   Cardiovascular: Negative for chest pain.  Gastrointestinal: Positive for nausea, vomiting and abdominal pain. Negative for diarrhea and constipation.  Genitourinary: Negative for hematuria.  Musculoskeletal: Positive for back pain.  Skin: Negative for rash.  Neurological: Negative for dizziness.  Psychiatric/Behavioral: Negative for behavioral problems.      Allergies  Fentanyl; Divalproex sodium; Fioricet-codeine; Ibuprofen; and Ketorolac tromethamine  Home Medications   Prior to Admission medications   Medication Sig Start Date End Date Taking? Authorizing Provider  ALPRAZolam Prudy Feeler) 0.5 MG tablet Take 1 tablet by mouth 2 (two) times daily. 07/31/14   Historical Provider, MD  aspirin-acetaminophen-caffeine (EXCEDRIN MIGRAINE) [phone] MG per tablet Take 2 tablets by mouth every 6 (six) hours as needed  for migraine.    Historical Provider, MD  diphenhydrAMINE (BENADRYL) 25 MG tablet Take 25-50 mg by mouth every 6 (six) hours as needed for allergies.    Historical Provider, MD  estradiol (ESTRACE) 1 MG tablet Take 1 tablet (1 mg total) by mouth daily. 08/15/14   [name], MD  HYDROcodone-acetaminophen (NORCO/VICODIN) 5-325 MG per tablet Take 1 tablet by mouth every 6 (six) hours as needed. 08/23/14   [name], MD  ibuprofen (ADVIL,MOTRIN) 200 MG tablet Take 400 mg by mouth every 6 (six) hours as needed for moderate pain.    Historical Provider, MD  ketorolac (TORADOL) 10 MG tablet Take 1 tablet (10 mg total) by mouth every 6 (six) hours as  needed (five day limit postop). Patient not taking: Reported on 08/23/2014 08/15/14   [name], MD  methocarbamol (ROBAXIN) 500 MG tablet Take 500 mg by mouth 4 (four) times daily. For muscle spasms 09/08/14   Historical Provider, MD  methylPREDNISolone (MEDROL DOSEPAK) 4 MG TBPK tablet See admin instructions. 6 tabs on the first day, decreasing by 1 tab daily until finished 09/08/14   Historical Provider, MD  oxyCODONE-acetaminophen (PERCOCET/ROXICET) 5-325 MG per tablet Take 1-2 tablets by mouth every 4 (four) hours as needed for moderate pain or severe pain. Patient not taking: Reported on 09/30/2014 08/15/14   [name], MD  promethazine (PHENERGAN) 25 MG tablet Take 1 tablet (25 mg total) by mouth every 6 (six) hours as needed for nausea or vomiting. 07/17/14   [name], MD  traMADol (ULTRAM) 50 MG tablet Take 1-2 tablets by mouth every 6 (six) hours as needed. pain 09/26/14   Historical Provider, MD   BP 100/59 mmHg  Pulse 78  Temp(Src) 98.2 F (36.8 C) (Oral)  Resp 20  Ht  (1.549 m)  Wt 120 lb (54.432 kg)  BMI 22.69 kg/m2  SpO2 100% Physical Exam  Constitutional: She is oriented to person, place, and time. She appears well-developed and well-nourished.  HENT:  Head: Normocephalic and atraumatic.  Eyes: Conjunctivae are normal. Right eye exhibits no discharge.  Neck: Neck supple.  Cardiovascular: Normal rate, regular rhythm and normal heart sounds.   No murmur heard. Pulmonary/Chest: Effort normal and breath sounds normal. She has no wheezes. She has no rales.  Abdominal: Soft. She exhibits no distension. There is tenderness.  Severe tenderness all over her abdomen especially right lower quadrant.  Musculoskeletal: Normal range of motion. She exhibits no edema.  Neurological: She is oriented to person, place, and time. No cranial nerve deficit.  Skin: Skin is warm and dry. No rash noted. She is not diaphoretic.  Psychiatric:  Distress over pain.  Nursing note and  vitals reviewed.   ED Course  Procedures (including critical care time) Labs Review Labs Reviewed - No data to display  Imaging Review No results found. I have personally reviewed and evaluated these images and lab results as part of my medical decision-making.   EKG Interpretation None      MDM   Final diagnoses:  None    Patient is a 34 year old female with documented history of narcotic abuse presenting today with right lower quadrant pain. Patient had hysterectomy and bilateral nephrectomy in the past. Would like to rule out appendicitis given the level of pain. We will get a CAT scan. We will give narcotics while we're waiting for the CAT scan results. However I did discuss with patient that we not comfortable giving narcotics prescription  unless there is a documented reason today.  Called to radiology to see if we could do CT wtihout contrast given her abdominal pain.  They would prefer contrast.   4:59 PM Patient  Requesting dilauded rather than morphine. I do not feel comfortable giving Dilaudid at this time given that she has normal vital signs and her history of narcotic absue. If we find pathology requiring narcotics we will be sure to give narcotics at that time.  5:11 PM Patient stating that if we do not give her Dilaudid she will leave the hospital and go to a different hospital. We again reiterated that we are happy to give Tylenol, Bentyl, antiemetics to help with her pain control. Additionally, i expressed that I was happy to give him more morphine. With a documented history of narcotic abuse I don't feel that escalating to Dilaudid is appropriate this time.  This was calmly explained to patient in her room with the nurse present. We gave her time to think about it and returned. She wishes to leave AMA. The risks of leaving including disability and death were explained to patient. She expresses understanding. and wishes to leave at this time.  Esta Carmon Randall An,  MD 10/25/14 1714

## 2014-10-25 NOTE — Discharge Instructions (Signed)
Please return if you would like to continue your care.   Abdominal Pain Many things can cause abdominal pain. Usually, abdominal pain is not caused by a disease and will improve without treatment. It can often be observed and treated at home. Your health care provider will do a physical exam and possibly order blood tests and X-rays to help determine the seriousness of your pain. However, in many cases, more time must pass before a clear cause of the pain can be found. Before that point, your health care provider may not know if you need more testing or further treatment. HOME CARE INSTRUCTIONS  Monitor your abdominal pain for any changes. The following actions may help to alleviate any discomfort you are experiencing:  Only take over-the-counter or prescription medicines as directed by your health care provider.  Do not take laxatives unless directed to do so by your health care provider.  Try a clear liquid diet (broth, tea, or water) as directed by your health care provider. Slowly move to a bland diet as tolerated. SEEK MEDICAL CARE IF:  You have unexplained abdominal pain.  You have abdominal pain associated with nausea or diarrhea.  You have pain when you urinate or have a bowel movement.  You experience abdominal pain that wakes you in the night.  You have abdominal pain that is worsened or improved by eating food.  You have abdominal pain that is worsened with eating fatty foods.  You have a fever. SEEK IMMEDIATE MEDICAL CARE IF:   Your pain does not go away within 2 hours.  You keep throwing up (vomiting).  Your pain is felt only in portions of the abdomen, such as the right side or the left lower portion of the abdomen.  You pass bloody or black tarry stools. MAKE SURE YOU:  Understand these instructions.   Will watch your condition.   Will get help right away if you are not doing well or get worse.  Document Released: 12/04/2004 Document Revised: 03/01/2013  Document Reviewed: 11/03/2012 [facility] Patient Information 2015 [place], [place]. This information is not intended to replace advice given to you by your health care provider. Make sure you discuss any questions you have with your health care provider.

## 2014-10-25 NOTE — ED Notes (Signed)
Notified Dr. Corlis Leak that pt is requesting pain medication.

## 2014-10-30 ENCOUNTER — Telehealth: Payer: Self-pay | Admitting: Obstetrics and Gynecology

## 2014-10-30 ENCOUNTER — Other Ambulatory Visit: Payer: Self-pay | Admitting: Obstetrics and Gynecology

## 2014-10-30 ENCOUNTER — Emergency Department (HOSPITAL_COMMUNITY)
Admission: EM | Admit: 2014-10-30 | Discharge: 2014-10-30 | Disposition: A | Discharge status: Home / Self Care | Payer: BLUE CROSS/BLUE SHIELD | Attending: Emergency Medicine | Admitting: Emergency Medicine

## 2014-10-30 ENCOUNTER — Encounter (HOSPITAL_COMMUNITY): Payer: Self-pay | Admitting: *Deleted

## 2014-10-30 ENCOUNTER — Telehealth: Payer: Self-pay | Admitting: General Practice

## 2014-10-30 ENCOUNTER — Emergency Department (HOSPITAL_COMMUNITY): Payer: BLUE CROSS/BLUE SHIELD

## 2014-10-30 DIAGNOSIS — F419 Anxiety disorder, unspecified: Secondary | ICD-10-CM | POA: Insufficient documentation

## 2014-10-30 DIAGNOSIS — Z79899 Other long term (current) drug therapy: Secondary | ICD-10-CM | POA: Insufficient documentation

## 2014-10-30 DIAGNOSIS — R1011 Right upper quadrant pain: Secondary | ICD-10-CM | POA: Insufficient documentation

## 2014-10-30 DIAGNOSIS — G8929 Other chronic pain: Secondary | ICD-10-CM | POA: Insufficient documentation

## 2014-10-30 DIAGNOSIS — R011 Cardiac murmur, unspecified: Secondary | ICD-10-CM | POA: Diagnosis not present

## 2014-10-30 DIAGNOSIS — Z8701 Personal history of pneumonia (recurrent): Secondary | ICD-10-CM | POA: Insufficient documentation

## 2014-10-30 DIAGNOSIS — Z87448 Personal history of other diseases of urinary system: Secondary | ICD-10-CM | POA: Diagnosis not present

## 2014-10-30 DIAGNOSIS — R109 Unspecified abdominal pain: Secondary | ICD-10-CM

## 2014-10-30 DIAGNOSIS — R1013 Epigastric pain: Secondary | ICD-10-CM | POA: Insufficient documentation

## 2014-10-30 DIAGNOSIS — R112 Nausea with vomiting, unspecified: Secondary | ICD-10-CM | POA: Insufficient documentation

## 2014-10-30 DIAGNOSIS — R140 Abdominal distension (gaseous): Secondary | ICD-10-CM | POA: Diagnosis not present

## 2014-10-30 DIAGNOSIS — Z720 Tobacco use: Secondary | ICD-10-CM | POA: Diagnosis not present

## 2014-10-30 DIAGNOSIS — R1031 Right lower quadrant pain: Secondary | ICD-10-CM | POA: Diagnosis not present

## 2014-10-30 DIAGNOSIS — Z8639 Personal history of other endocrine, nutritional and metabolic disease: Secondary | ICD-10-CM | POA: Insufficient documentation

## 2014-10-30 LAB — COMPREHENSIVE METABOLIC PANEL
ALBUMIN: 3.6 g/dL (ref 3.5–5.0)
ALK PHOS: 67 U/L (ref 38–126)
ALT: 22 U/L (ref 14–54)
AST: 18 U/L (ref 15–41)
Anion gap: 7 (ref 5–15)
BILIRUBIN TOTAL: 0.1 mg/dL — AB (ref 0.3–1.2)
BUN: 9 mg/dL (ref 6–20)
CALCIUM: 8.1 mg/dL — AB (ref 8.9–10.3)
CO2: 30 mmol/L (ref 22–32)
Chloride: 104 mmol/L (ref 101–111)
Creatinine, Ser: 0.63 mg/dL (ref 0.44–1.00)
GFR calc Af Amer: >60 mL/min (ref >60)
GFR calc non Af Amer: >60 mL/min (ref >60)
GLUCOSE: 91 mg/dL (ref 65–99)
POTASSIUM: 3.4 mmol/L — AB (ref 3.5–5.1)
Sodium: 141 mmol/L (ref 135–145)
TOTAL PROTEIN: 6.2 g/dL — AB (ref 6.5–8.1)

## 2014-10-30 LAB — CBC WITH DIFFERENTIAL/PLATELET
BASOS ABS: 0 10*3/uL (ref 0.0–0.1)
BASOS PCT: 0 % (ref 0–1)
Eosinophils Absolute: 0.4 10*3/uL (ref 0.0–0.7)
Eosinophils Relative: 4 % (ref 0–5)
HEMATOCRIT: 36.4 % (ref 36.0–46.0)
HEMOGLOBIN: 12.5 g/dL (ref 12.0–15.0)
Lymphocytes Relative: 40 % (ref 12–46)
Lymphs Abs: 4.6 10*3/uL — ABNORMAL HIGH (ref 0.7–4.0)
MCH: 35.4 pg — ABNORMAL HIGH (ref 26.0–34.0)
MCHC: 34.3 g/dL (ref 30.0–36.0)
MCV: 103.1 fL — ABNORMAL HIGH (ref 78.0–100.0)
Monocytes Absolute: 0.7 10*3/uL (ref 0.1–1.0)
Monocytes Relative: 6 % (ref 3–12)
NEUTROS ABS: 5.8 10*3/uL (ref 1.7–7.7)
NEUTROS PCT: 50 % (ref 43–77)
Platelets: 199 10*3/uL (ref 150–400)
RBC: 3.53 MIL/uL — AB (ref 3.87–5.11)
RDW: 11.9 % (ref 11.5–15.5)
WBC: 11.6 10*3/uL — ABNORMAL HIGH (ref 4.0–10.5)

## 2014-10-30 LAB — URINALYSIS, ROUTINE W REFLEX MICROSCOPIC
BILIRUBIN URINE: NEGATIVE
Glucose, UA: NEGATIVE mg/dL
Hgb urine dipstick: NEGATIVE
Ketones, ur: NEGATIVE mg/dL
Leukocytes, UA: NEGATIVE
NITRITE: NEGATIVE
PH: 6 (ref 5.0–8.0)
Protein, ur: NEGATIVE mg/dL
SPECIFIC GRAVITY, URINE: 1.02 (ref 1.005–1.030)
Urobilinogen, UA: 0.2 mg/dL (ref 0.0–1.0)

## 2014-10-30 LAB — LIPASE, BLOOD: Lipase: 24 U/L (ref 22–51)

## 2014-10-30 MED ORDER — ACETAMINOPHEN 325 MG PO TABS
650.0000 mg | ORAL_TABLET | Freq: Once | ORAL | Status: AC
  Administered 2014-10-30: 650 mg via ORAL
  Filled 2014-10-30: qty 2

## 2014-10-30 MED ORDER — ONDANSETRON 4 MG PO TBDP
4.0000 mg | ORAL_TABLET | Freq: Once | ORAL | Status: AC
  Administered 2014-10-30: 4 mg via ORAL
  Filled 2014-10-30: qty 1

## 2014-10-30 MED ORDER — IOHEXOL 300 MG/ML  SOLN
100.0000 mL | Freq: Once | INTRAMUSCULAR | Status: AC | PRN
  Administered 2014-10-30: 100 mL via INTRAVENOUS

## 2014-10-30 MED ORDER — ONDANSETRON 8 MG PO TBDP: 8.0000 mg | ORAL_TABLET | Freq: Once | ORAL | Status: DC

## 2014-10-30 MED ORDER — DICYCLOMINE HCL 20 MG PO TABS: 20.0000 mg | ORAL_TABLET | Freq: Four times a day (QID) | ORAL | Status: DC

## 2014-10-30 MED ORDER — IOHEXOL 300 MG/ML  SOLN
50.0000 mL | Freq: Once | INTRAMUSCULAR | Status: AC | PRN
  Administered 2014-10-30: 50 mL via ORAL

## 2014-10-30 MED ORDER — ONDANSETRON HCL 4 MG PO TABS
4.0000 mg | ORAL_TABLET | Freq: Three times a day (TID) | ORAL | Status: DC | PRN
Start: 2014-10-30 — End: 2014-11-10

## 2014-10-30 MED ORDER — DICYCLOMINE HCL 10 MG/ML IM SOLN
20.0000 mg | Freq: Once | INTRAMUSCULAR | Status: AC
  Administered 2014-10-30: 20 mg via INTRAMUSCULAR
  Filled 2014-10-30: qty 2

## 2014-10-30 NOTE — Discharge Instructions (Signed)
Call and make an appointment to follow-up with the gastroenterologist for your ongoing pain. Return immediately for worsening pain, persistent vomiting, fever or for any concerns.  Abdominal Pain Many things can cause abdominal pain. Usually, abdominal pain is not caused by a disease and will improve without treatment. It can often be observed and treated at home. Your health care provider will do a physical exam and possibly order blood tests and X-rays to help determine the seriousness of your pain. However, in many cases, more time must pass before a clear cause of the pain can be found. Before that point, your health care provider may not know if you need more testing or further treatment. HOME CARE INSTRUCTIONS  Monitor your abdominal pain for any changes. The following actions may help to alleviate any discomfort you are experiencing:  Only take over-the-counter or prescription medicines as directed by your health care provider.  Do not take laxatives unless directed to do so by your health care provider.  Try a clear liquid diet (broth, tea, or water) as directed by your health care provider. Slowly move to a bland diet as tolerated. SEEK MEDICAL CARE IF:  You have unexplained abdominal pain.  You have abdominal pain associated with nausea or diarrhea.  You have pain when you urinate or have a bowel movement.  You experience abdominal pain that wakes you in the night.  You have abdominal pain that is worsened or improved by eating food.  You have abdominal pain that is worsened with eating fatty foods.  You have a fever. SEEK IMMEDIATE MEDICAL CARE IF:   Your pain does not go away within 2 hours.  You keep throwing up (vomiting).  Your pain is felt only in portions of the abdomen, such as the right side or the left lower portion of the abdomen.  You pass bloody or black tarry stools. MAKE SURE YOU:  Understand these instructions.   Will watch your condition.   Will  get help right away if you are not doing well or get worse.  Document Released: 12/04/2004 Document Revised: 03/01/2013 Document Reviewed: 11/03/2012 [employer] Patient Information 2015 [place], [place]. This information is not intended to replace advice given to you by your health care provider. Make sure you discuss any questions you have with your health care provider.

## 2014-10-30 NOTE — ED Notes (Signed)
Pt c/o abdominal pain with n/v x 2 weeks

## 2014-10-30 NOTE — ED Notes (Signed)
Pt continually calling for nurse requesting pain medications. Pt has been advised that she will not be receiving narcotic pain medication.

## 2014-10-30 NOTE — Progress Notes (Signed)
Pt requesting analgesics after followup of ED visit this a.m.  I will Not be giving hydrocodone but will try bentyl 20 ac and HS for gi pain as a clinical trial. Pt has primary care appt this week

## 2014-10-30 NOTE — ED Notes (Signed)
Pt calling out requesting more pain meds. Pt advised that she would not be getting anymore pain meds.

## 2014-10-30 NOTE — ED Notes (Signed)
Pt requesting warm blanket. However, we are all out of blankets. Pt was given a sheet to go with her first blanket.

## 2014-10-30 NOTE — ED Notes (Addendum)
Pt called out for this nurse again. Pt stating that ct needs to hurry up, that she is in pain and feels nauseated. Pt states I just want to go home if they can't hurry up. I reminded her that she just left AMA several days ago. CT states that pt won't have her ct until 5:45am. Pt was made aware of this when she was initially given the oral contrast.

## 2014-10-30 NOTE — ED Provider Notes (Signed)
CSN: [id number]     Arrival date & time 10/30/14  0147 History   First MD Initiated Contact with Patient 10/30/14 0204     Chief Complaint  Patient presents with  . Abdominal Pain     (Consider location/radiation/quality/duration/timing/severity/associated sxs/prior Treatment) HPI Patient presents with several weeks of episodic abdominal pain. She states the pain is mostly located on the right side. It is worse after eating. Associated with nausea and vomiting. She states she's had several episodes of vomiting today. No hematemesis or coffee ground emesis. Patient had a normal soft bowel movement today. No fever or chills. Patient denies any urinary symptoms including hematuria, dysuria, frequency or urgency. She's had no vaginal symptoms. Patient is status post hysterectomy. Patient was seen for similar abdominal pain on 8/17. At that time she left AMA because she did not receive Dilaudid. Patient does have a documented narcotics abuse history. Patient has a history of chronic abdominal pain with multiple CT scans in the past. Past Medical History  Diagnosis Date  . Hypokalemia   . Narcotic abuse     5 yrs ago lortab, off few yrs  . Anxiety   . Chronic pain   . Endometriosis   . Chronic headache   . Chronic back pain   . Chronic pelvic pain in female   . Heart murmur     as a child  . Pneumonia     65 years  old  . Hx of opioid abuse   . Renal disorder Cyst on Kidney   Past Surgical History  Procedure Laterality Date  . Tonsillectomy    . Cholecystectomy  2010  . Tubal ligation    . Carpal tunnel release      both  . Incision and drainage abscess N/A 07/14/2014    Procedure: INCISION AND DRAINAGE VULVAR ABSCESS;  Surgeon: [name], MD;  Location: [facility];  Service: Gynecology;  Laterality: N/A;  . Abdominal hysterectomy  2007    left ovary remains  . Laparoscopic salpingo oopherectomy Left 08/15/2014    Procedure: LAPAROSCOPIC LEFT SALPINGO OOPHORECTOMY;  Surgeon: [name], MD;  Location: [facility];  Service: Gynecology;  Laterality: Left;   Family History  Problem Relation Age of Onset  . Stroke Father   . Diverticulitis Father   . Heart disease    . Arthritis    . Diabetes    . Kidney disease    . Hypertension Mother   . Diabetes Mother    Social History  Substance Use Topics  . Smoking status: Current Every Day Smoker -- 0.50 packs/day for 18 years    Types: Cigarettes  . Smokeless tobacco: Never Used  . Alcohol Use: No     Comment: rarely   OB History    Gravida Para Term Preterm AB TAB SAB Ectopic Multiple Living   Review of Systems  Constitutional: Negative for fever and chills.  Respiratory: Negative for shortness of breath.   Cardiovascular: Negative for chest pain.  Gastrointestinal: Positive for nausea, vomiting, abdominal pain and abdominal distention. Negative for diarrhea and constipation.  Genitourinary: Negative for dysuria, frequency, hematuria, flank pain, vaginal bleeding, vaginal discharge and difficulty urinating.  Musculoskeletal: Negative for back pain, neck pain and neck stiffness.  Skin: Negative for rash and wound.  Neurological: Negative for dizziness, weakness, light-headedness, numbness and headaches.  All other systems reviewed and are negative.  Allergies  Fentanyl; Divalproex sodium; Fioricet-codeine; Ibuprofen; and Ketorolac tromethamine  Home Medications   Prior to Admission medications   Medication Sig Start Date End Date Taking? Authorizing Provider  ALPRAZolam Prudy Feeler) 0.5 MG tablet Take 1 tablet by mouth 2 (two) times daily. 07/31/14   Historical Provider, MD  aspirin-acetaminophen-caffeine (EXCEDRIN MIGRAINE) [phone] MG per tablet Take 2 tablets by mouth every 6 (six) hours as needed for migraine.    Historical Provider, MD  diphenhydrAMINE (BENADRYL) 25 MG tablet Take 25-50 mg by mouth every 6 (six) hours as needed for allergies.    Historical Provider, MD  estradiol  (ESTRACE) 1 MG tablet Take 1 tablet (1 mg total) by mouth daily. Patient not taking: Reported on 10/25/2014 08/15/14   [name], MD  HYDROcodone-acetaminophen (NORCO/VICODIN) 5-325 MG per tablet Take 1 tablet by mouth every 6 (six) hours as needed. Patient not taking: Reported on 10/25/2014 08/23/14   [name], MD  ibuprofen (ADVIL,MOTRIN) 200 MG tablet Take 400 mg by mouth every 6 (six) hours as needed for moderate pain.    Historical Provider, MD  ketorolac (TORADOL) 10 MG tablet Take 1 tablet (10 mg total) by mouth every 6 (six) hours as needed (five day limit postop). Patient not taking: Reported on 08/23/2014 08/15/14   [name], MD  ondansetron (ZOFRAN) 4 MG tablet Take 1 tablet (4 mg total) by mouth every 8 (eight) hours as needed for nausea or vomiting. 10/30/14   Loren Racer, MD  oxyCODONE-acetaminophen (PERCOCET/ROXICET) 5-325 MG per tablet Take 1-2 tablets by mouth every 4 (four) hours as needed for moderate pain or severe pain. Patient not taking: Reported on 09/30/2014 08/15/14   [name], MD  promethazine (PHENERGAN) 25 MG tablet Take 1 tablet (25 mg total) by mouth every 6 (six) hours as needed for nausea or vomiting. Patient not taking: Reported on 10/25/2014 07/17/14   [name], MD  traMADol (ULTRAM) 50 MG tablet Take 1-2 tablets by mouth every 6 (six) hours as needed. pain 09/26/14   Historical Provider, MD  traMADol-acetaminophen (ULTRACET) 37.5-325 MG per tablet Take 1 tablet by mouth every 6 (six) hours as needed. 10/24/14   Historical Provider, MD   BP 118/82 mmHg  Pulse 104  Temp(Src) 97.5 F (36.4 C) (Oral)  Resp 18  Ht 5\' 1"  (1.549 m)  Wt 120 lb (54.432 kg)  BMI 22.69 kg/m2  SpO2 100% Physical Exam  Constitutional: She is oriented to person, place, and time. She appears well-developed and well-nourished. No distress.  HENT:  Head: Normocephalic and atraumatic.  Mouth/Throat: Oropharynx is clear and moist.  Eyes: EOM are normal. Pupils are  equal, round, and reactive to light.  Neck: Normal range of motion. Neck supple.  Cardiovascular: Normal rate and regular rhythm.   Pulmonary/Chest: Effort normal and breath sounds normal. No respiratory distress. She has no wheezes. She has no rales. She exhibits no tenderness.  Abdominal: Soft. Bowel sounds are normal. She exhibits distension. She exhibits no mass. There is tenderness. There is no rebound and no guarding.  Mild abdominal distention. Patient with tenderness to palpation in the right lower, right upper and epigastric regions. There is no rebound or guarding.  Musculoskeletal: Normal range of motion. She exhibits no edema or tenderness.  No CVA tenderness bilaterally.  Neurological: She is alert and oriented to person, place, and time.  Moves all extremities without deficit. Sensation is grossly intact.  Skin: Skin is warm and dry. No rash noted. No erythema.  Psychiatric: She has a normal mood and affect. Her behavior is normal.  Nursing note and vitals reviewed.   ED Course  Procedures (including critical care time) Labs Review Labs Reviewed  CBC WITH DIFFERENTIAL/PLATELET - Abnormal; Notable for the following:    WBC 11.6 (*)    RBC 3.53 (*)    MCV 103.1 (*)    MCH 35.4 (*)    Lymphs Abs 4.6 (*)    All other components within normal limits  COMPREHENSIVE METABOLIC PANEL - Abnormal; Notable for the following:    Potassium 3.4 (*)    Calcium 8.1 (*)    Total Protein 6.2 (*)    Total Bilirubin 0.1 (*)    All other components within normal limits  LIPASE, BLOOD  URINALYSIS, ROUTINE W REFLEX MICROSCOPIC (NOT AT [facility])    Imaging Review Ct Abdomen Pelvis W Contrast  10/30/2014   CLINICAL DATA:  Right lower quadrant pain. Sharp pain for 2 weeks. Nausea and vomiting.  EXAM: CT ABDOMEN AND PELVIS WITH CONTRAST  TECHNIQUE: Multidetector CT imaging of the abdomen and pelvis was performed using the standard protocol following bolus administration of intravenous contrast.   CONTRAST:  50mL OMNIPAQUE IOHEXOL 300 MG/ML SOLN, OMNIPAQUE IOHEXOL 300 MG/ML SOLN  COMPARISON:  Radiographs earlier this day. Most recent CT 06/21/2014. Most recent pelvic ultrasound 08/07/2014  FINDINGS: The included lung bases are clear. The heart size is normal.  Heterogeneous appearance of the liver, progressed from prior exam. Distribution is peripheral greater than central. Focal fatty infiltration again seen adjacent with falciform ligament. The portal vein is patent. Clips in the gallbladder fossa from cholecystectomy. No progressive biliary dilatation. Spleen is normal in size. The adrenal glands are normal. Kidneys demonstrate symmetric enhancement without hydronephrosis or perinephric stranding. Subcentimeter cyst in the left kidney is unchanged. Unchanged prominence of the pancreatic duct.  The stomach is physiologically distended. There are no dilated or thickened bowel loops. The appendix is normal. No bowel obstruction with oral contrast seen throughout the colon. No free air, free fluid, or intra-abdominal fluid collection. No mesenteric adenopathy.  No retroperitoneal adenopathy. Abdominal aorta is normal in caliber. The IVC is physiologically distended.  Within the pelvis the urinary bladder is physiologically distended. Uterus is surgically absent. Neither ovary is discretely visualized. No free fluid in the pelvis. No pelvic adenopathy.  There are no acute or suspicious osseous abnormalities.  IMPRESSION: 1. Heterogeneous appearance of the liver, progressed from prior exam. The appearance is nonspecific. This could be further characterized with hepatic protocol MRI. Focal fatty infiltration adjacent with falciform ligament is unchanged from prior exam. 2. Remainder the exam is unchanged.   Electronically Signed   By: Rubye Oaks M.D.   On: 10/30/2014 06:00   Dg Abd Acute W/chest  10/30/2014   CLINICAL DATA:  Initial evaluation for acute right-sided abdominal pain, nausea, vomiting.   EXAM: DG ABDOMEN ACUTE W/ 1V CHEST  COMPARISON:  Prior study from 06/21/2014.  FINDINGS: Cardiac and mediastinal silhouettes are within normal limits. Tracheal air column midline and patent.  Lungs are normally inflated. No focal infiltrate, pulmonary edema, or pleural effusion. No pneumothorax.  Bowel gas pattern within normal limits without evidence of obstruction or ileus. No abnormal bowel wall thickening. No free air. No soft tissue mass or abnormal calcification. Cholecystectomy clips present within the right upper quadrant. Additional surgical clips are present within the right lower quadrant.  No acute osseus abnormality. 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  IMPRESSION: Negative abdominal  radiographs.  No acute cardiopulmonary disease.   Electronically Signed   By: Rise Mu M.D.   On: 10/30/2014 03:21   I have personally reviewed and evaluated these images and lab results as part of my medical decision-making.   EKG Interpretation None      MDM   Final diagnoses:  Chronic abdominal pain    I discussed with the patient that I will not be giving her narcotics. We will start with laboratory exam and plain films given her history of multiple CT scans in the past in an attempt to limit her radiation. I believe that appendicitis is very unlikely at this point.   Patient with no vomiting in the emergency department. Abdominal exam remains benign. Heterogeneous appearance of the liver on CT that is nonspecific. Normal LFTs. We'll refer patient to gastroenterology for further workup. Return precautions given. Patient has voiced understanding.  Loren Racer, MD 10/30/14 [phone]

## 2014-11-02 ENCOUNTER — Ambulatory Visit: Payer: BLUE CROSS/BLUE SHIELD | Admitting: Family Medicine

## 2014-11-03 ENCOUNTER — Encounter: Payer: Self-pay | Admitting: Family Medicine

## 2014-11-09 ENCOUNTER — Ambulatory Visit: Payer: BLUE CROSS/BLUE SHIELD | Admitting: Family Medicine

## 2014-11-10 ENCOUNTER — Ambulatory Visit (INDEPENDENT_AMBULATORY_CARE_PROVIDER_SITE_OTHER): Payer: BLUE CROSS/BLUE SHIELD | Admitting: Family Medicine

## 2014-11-10 ENCOUNTER — Encounter: Payer: Self-pay | Admitting: Family Medicine

## 2014-11-10 VITALS — BP 114/62 | HR 52 | Temp 98.0°F | Ht 61.0 in | Wt 112.4 lb

## 2014-11-10 DIAGNOSIS — F418 Other specified anxiety disorders: Secondary | ICD-10-CM

## 2014-11-10 DIAGNOSIS — N949 Unspecified condition associated with female genital organs and menstrual cycle: Secondary | ICD-10-CM | POA: Diagnosis not present

## 2014-11-10 DIAGNOSIS — G4700 Insomnia, unspecified: Secondary | ICD-10-CM

## 2014-11-10 DIAGNOSIS — G8929 Other chronic pain: Secondary | ICD-10-CM | POA: Diagnosis not present

## 2014-11-10 DIAGNOSIS — R102 Pelvic and perineal pain: Secondary | ICD-10-CM

## 2014-11-10 MED ORDER — AMITRIPTYLINE HCL 50 MG PO TABS: ORAL_TABLET | ORAL | Status: DC

## 2014-11-10 NOTE — Patient Instructions (Signed)
Generalized Anxiety Disorder Generalized anxiety disorder (GAD) is a mental disorder. It interferes with life functions, including relationships, work, and school. GAD is different from normal anxiety, which everyone experiences at some point in their lives in response to specific life events and activities. Normal anxiety actually helps us prepare for and get through these life events and activities. Normal anxiety goes away after the event or activity is over.  GAD causes anxiety that is not necessarily related to specific events or activities. It also causes excess anxiety in proportion to specific events or activities. The anxiety associated with GAD is also difficult to control. GAD can vary from mild to severe. People with severe GAD can have intense waves of anxiety with physical symptoms (panic attacks).  SYMPTOMS The anxiety and worry associated with GAD are difficult to control. This anxiety and worry are related to many life events and activities and also occur more days than not for 6 months or longer. People with GAD also have three or more of the following symptoms (one or more in children):  Restlessness.   Fatigue.  Difficulty concentrating.   Irritability.  Muscle tension.  Difficulty sleeping or unsatisfying sleep. DIAGNOSIS GAD is diagnosed through an assessment by your health care provider. Your health care provider will ask you questions aboutyour mood,physical symptoms, and events in your life. Your health care provider may ask you about your medical history and use of alcohol or drugs, including prescription medicines. Your health care provider may also do a physical exam and blood tests. Certain medical conditions and the use of certain substances can cause symptoms similar to those associated with GAD. Your health care provider may refer you to a mental health specialist for further evaluation. TREATMENT The following therapies are usually used to treat GAD:    Medication. Antidepressant medication usually is prescribed for long-term daily control. Antianxiety medicines may be added in severe cases, especially when panic attacks occur.   Talk therapy (psychotherapy). Certain types of talk therapy can be helpful in treating GAD by providing support, education, and guidance. A form of talk therapy called cognitive behavioral therapy can teach you healthy ways to think about and react to daily life events and activities.  Stress managementtechniques. These include yoga, meditation, and exercise and can be very helpful when they are practiced regularly. A mental health specialist can help determine which treatment is best for you. Some people see improvement with one therapy. However, other people require a combination of therapies. Document Released: 06/21/2012 Document Revised: 07/11/2013 Document Reviewed: 06/21/2012 [employer] Patient Information 2015 [employer]. This information is not intended to replace advice given to you by your health care provider. Make sure you discuss any questions you have with your health care provider.  

## 2014-11-10 NOTE — Assessment & Plan Note (Signed)
Patient has depression and anxiety that she feels is a big issue because of her family situation and going into a divorce soon. We'll try Elavil

## 2014-11-10 NOTE — Progress Notes (Signed)
BP 114/62 mmHg  Pulse 52  Temp(Src) 98 F (36.7 C) (Oral)  Ht 5\' 1"  (1.549 m)  Wt 112 lb 6.4 oz (50.984 kg)  BMI 21.25 kg/m2   Subjective:    Patient ID: [name], female    DOB: 04/13/1980, 34 y.o.   MRN: [id number]  HPI: [name] is a 34 y.o. female presenting on 11/10/2014 for Establish Care   HPI Chronic pelvic and back pain Patient presents today because has been seen a neurologist/pain management doctor previously for her chronic back and pelvic pain. She is recently leaving him because per her she does not like him and he doesn't listen to her. She says that she takes Norco has been on tramadol and Percocet and all other pain meds recently as well. She would like a pain management referral. Pain is unchanged from previous is only worsened right now because she doesn't have her pain meds. Controlled substances report was reviewed and showed that patient had been receiving controlled substances from pain management physician but also from a couple of other locations and smaller quantities.  Depression and anxiety Patient also presents today because she is complaining of depression and anxiety. She is coming up on a time period where she is going through a divorce is also had a lot of recent family deaths in the past 5 years and has been taking Xanax for this alone to treat her depression and anxiety. She would like a refill for that. She also states that she has tried multiple different other antidepressants and they never helped her. She also complains of difficulty sleeping, mostly difficulty falling asleep at night because her brain is constantly moving. She denies any suicidal ideation and currently has her children that are her motivation to keep going. Sleep Issues:  Yes Interests and hobbies decrease: No Guilt:  Yes Energy Loss:  Yes Concentration difficulties:  Yes Appetite changes:  No Psychomotor Retardation:  No Suicidal Ideations:  No Has there ever been a period of  time when you were not your usual self and ... - you felt so good or so hyper that other people thought you were not your normal self or you were so hyper that you got into trouble?  No - you were so irritable that you shouted at people or started fights or arguments? No - you felt much more self-confident than usual? No - you got much less sleep than usual and found that you didn't really miss it? No - you were more talkative or spoke much faster than usual? No - thoughts raced through your head or you couldn't slow your mind down? Yes - you were so easily distracted by things around you that you had trouble concentrating or stay on track? Yes -you had much more energy than usual? No - you were much more active or did many more things than usual? No - you were much more social or outgoing than usual, for example, you telephoned friends in the middle of the night? No -you were much more interested in sex than usual? No -you did things that were unusual for you or that other people might have thought were excessive, foolish or risky? No -spending money got you or your family in trouble? No    If you checked yes for more than one of the above, have several of these ever happened during the same period of time? Yes  How much of a problem did any of these cause you- like being able  to work; having family, money or legal troubles; getting in arguments or fights? Minor difficulty, mainly with relationship with husband which was artery falling apart.  Have any of your blood relatives had manic-depressive illness or bipolar disorder?  Yes   Has a health professional ever told you that you have manic-depressive illness or bipolar disorder? No                                 Relevant past medical, surgical, family and social history reviewed and updated as indicated. Interim medical history since our last visit reviewed. Allergies and medications reviewed and updated.  Review of Systems    Constitutional: Negative for fever and chills.  HENT: Negative for congestion, ear discharge and ear pain.   Eyes: Negative for redness and visual disturbance.  Respiratory: Negative for chest tightness and shortness of breath.   Cardiovascular: Negative for chest pain and leg swelling.  Genitourinary: Positive for pelvic pain (chronic and has artery had a hysterectomy for recurrent endometriosis.). Negative for dysuria and difficulty urinating.  Musculoskeletal: Positive for back pain. Negative for myalgias, arthralgias and gait problem.  Skin: Negative for rash.  Neurological: Negative for light-headedness and headaches.  Psychiatric/Behavioral: Positive for sleep disturbance, dysphoric mood, decreased concentration and agitation. Negative for suicidal ideas and behavioral problems. The patient is nervous/anxious.   All other systems reviewed and are negative.   Per HPI unless specifically indicated above     Medication List       This list is accurate as of: 11/10/14  2:33 PM.  Always use your most recent med list.               ALPRAZolam 0.5 MG tablet  Commonly known as:  XANAX  Take 1 tablet by mouth 2 (two) times daily.     amitriptyline 50 MG tablet  Commonly known as:  ELAVIL  Take 1/2 tablet for first week and then one pill at bedtime.     aspirin-acetaminophen-caffeine 250-250-65 MG per tablet  Commonly known as:  EXCEDRIN MIGRAINE  Take 2 tablets by mouth every 6 (six) hours as needed for migraine.     diphenhydrAMINE 25 MG tablet  Commonly known as:  BENADRYL  Take 25-50 mg by mouth every 6 (six) hours as needed for allergies.     traMADol 50 MG tablet  Commonly known as:  ULTRAM  Take 1-2 tablets by mouth every 6 (six) hours as needed. pain           Objective:    BP 114/62 mmHg  Pulse 52  Temp(Src) 98 F (36.7 C) (Oral)  Ht 5\' 1"  (1.549 m)  Wt 112 lb 6.4 oz (50.984 kg)  BMI 21.25 kg/m2  Wt Readings from Last 3 Encounters:  11/10/14 112 lb 6.4  oz (50.984 kg)  10/30/14 120 lb (54.432 kg)  10/25/14 120 lb (54.432 kg)    Physical Exam  Constitutional: She is oriented to person, place, and time. She appears well-developed and well-nourished. No distress.  Eyes: Conjunctivae and EOM are normal. Pupils are equal, round, and reactive to light.  Neck: Neck supple. No thyromegaly present.  Cardiovascular: Normal rate and regular rhythm.   No murmur heard. Pulmonary/Chest: Effort normal and breath sounds normal. No respiratory distress. She has no wheezes.  Musculoskeletal: Normal range of motion. She exhibits no edema or tenderness.  No reproducible back tenderness, negative straight leg raise.  Lymphadenopathy:  She has no cervical adenopathy.  Neurological: She is alert and oriented to person, place, and time. Coordination normal.  Skin: Skin is warm and dry. No rash noted. She is not diaphoretic.  Psychiatric: Her speech is normal. Judgment and thought content normal. Her mood appears anxious. Her affect is blunt and labile. Her affect is not angry and not inappropriate. She is agitated. Cognition and memory are normal. She exhibits a depressed mood. She expresses no suicidal ideation. She expresses no suicidal plans.  Vitals reviewed.   Results for orders placed or performed during the hospital encounter of 10/30/14  CBC with Differential/Platelet  Result Value Ref Range   WBC 11.6 (H) 4.0 - 10.5 K/uL   RBC 3.53 (L) 3.87 - 5.11 MIL/uL   Hemoglobin 12.5 12.0 - 15.0 g/dL   HCT 45.4 09.8 - 11.9 %   MCV 103.1 (H) 78.0 - 100.0 fL   MCH 35.4 (H) 26.0 - 34.0 pg   MCHC 34.3 30.0 - 36.0 g/dL   RDW 14.7 82.9 - 56.2 %   Platelets 199 150 - 400 K/uL   Neutrophils Relative % 50 43 - 77 %   Neutro Abs 5.8 1.7 - 7.7 K/uL   Lymphocytes Relative 40 12 - 46 %   Lymphs Abs 4.6 (H) 0.7 - 4.0 K/uL   Monocytes Relative 6 3 - 12 %   Monocytes Absolute 0.7 0.1 - 1.0 K/uL   Eosinophils Relative 4 0 - 5 %   Eosinophils Absolute 0.4 0.0 - 0.7  K/uL   Basophils Relative 0 0 - 1 %   Basophils Absolute 0.0 0.0 - 0.1 K/uL  Comprehensive metabolic panel  Result Value Ref Range   Sodium 141 135 - 145 mmol/L   Potassium 3.4 (L) 3.5 - 5.1 mmol/L   Chloride 104 101 - 111 mmol/L   CO2 30 22 - 32 mmol/L   Glucose, Bld 91 65 - 99 mg/dL   BUN 9 6 - 20 mg/dL   Creatinine, Ser 1.30 0.44 - 1.00 mg/dL   Calcium 8.1 (L) 8.9 - 10.3 mg/dL   Total Protein 6.2 (L) 6.5 - 8.1 g/dL   Albumin 3.6 3.5 - 5.0 g/dL   AST 18 15 - 41 U/L   ALT 22 14 - 54 U/L   Alkaline Phosphatase 67 38 - 126 U/L   Total Bilirubin 0.1 (L) 0.3 - 1.2 mg/dL   GFR calc non Af Amer >60 >60 mL/min   GFR calc Af Amer >60 >60 mL/min   Anion gap 7 5 - 15  Lipase, blood  Result Value Ref Range   Lipase 24 22 - 51 U/L  Urinalysis, Routine w reflex microscopic (not at [facility])  Result Value Ref Range   Color, Urine YELLOW YELLOW   APPearance CLEAR CLEAR   Specific Gravity, Urine 1.020 1.005 - 1.030   pH 6.0 5.0 - 8.0   Glucose, UA NEGATIVE NEGATIVE mg/dL   Hgb urine dipstick NEGATIVE NEGATIVE   Bilirubin Urine NEGATIVE NEGATIVE   Ketones, ur NEGATIVE NEGATIVE mg/dL   Protein, ur NEGATIVE NEGATIVE mg/dL   Urobilinogen, UA 0.2 0.0 - 1.0 mg/dL   Nitrite NEGATIVE NEGATIVE   Leukocytes, UA NEGATIVE NEGATIVE      Assessment & Plan:   Problem List Items Addressed This Visit      Other   Chronic pelvic pain in female   Relevant Medications   amitriptyline (ELAVIL) 50 MG tablet   Other Relevant Orders   Ambulatory referral to Pain Clinic  Depression with anxiety - Primary    Patient has depression and anxiety that she feels is a big issue because of her family situation and going into a divorce soon. We'll try Elavil      Insomnia    Treat with [place] as well.          Follow up plan: Return in about 4 weeks (around 12/08/2014), or if symptoms worsen or fail to improve.  Arville Care, MD [facility] 11/10/2014, 2:33 PM

## 2014-11-13 NOTE — Assessment & Plan Note (Signed)
Treat with [place] as well.

## 2014-11-17 ENCOUNTER — Ambulatory Visit: Payer: BLUE CROSS/BLUE SHIELD | Admitting: Family Medicine

## 2014-11-18 ENCOUNTER — Encounter: Payer: Self-pay | Admitting: Family Medicine

## 2014-11-18 ENCOUNTER — Ambulatory Visit (INDEPENDENT_AMBULATORY_CARE_PROVIDER_SITE_OTHER): Payer: BLUE CROSS/BLUE SHIELD | Admitting: Family Medicine

## 2014-11-18 VITALS — BP 97/70 | HR 80 | Temp 97.0°F | Ht 61.0 in | Wt 111.0 lb

## 2014-11-18 DIAGNOSIS — A499 Bacterial infection, unspecified: Secondary | ICD-10-CM

## 2014-11-18 DIAGNOSIS — J329 Chronic sinusitis, unspecified: Secondary | ICD-10-CM

## 2014-11-18 DIAGNOSIS — L089 Local infection of the skin and subcutaneous tissue, unspecified: Secondary | ICD-10-CM

## 2014-11-18 DIAGNOSIS — B9689 Other specified bacterial agents as the cause of diseases classified elsewhere: Secondary | ICD-10-CM

## 2014-11-18 MED ORDER — AMOXICILLIN 500 MG PO CAPS: 500.0000 mg | ORAL_CAPSULE | Freq: Three times a day (TID) | ORAL | Status: DC

## 2014-11-18 MED ORDER — MUPIROCIN CALCIUM 2 % EX CREA: 1.0000 "application " | TOPICAL_CREAM | Freq: Two times a day (BID) | CUTANEOUS | Status: DC

## 2014-11-18 NOTE — Patient Instructions (Addendum)
Take antibiotic until completed Clean irritated area of skin regularly with warm soapy water Use nasal saline frequently during the day Apply Bactroban ointment or mupirocin ointment to the area of skin beneath the left mandible after cleansing with warm soapy water Drink plenty of fluids and please make all efforts to stop smoking

## 2014-11-18 NOTE — Progress Notes (Signed)
Subjective:    Patient ID: [name], female    DOB: 1981-03-02, 34 y.o.   MRN: [id number]  HPI Patient here today for head cold, congestion, and cough that started on Tuesday. Patient has also has some chills.      Patient Active Problem List   Diagnosis Date Noted  . Depression with anxiety 11/10/2014  . Insomnia 11/10/2014  . Chronic pelvic pain in female 08/15/2014  . Vulvar abscess left labia majora 07/14/2014  . Other and unspecified ovarian cyst 09/16/2013  . Abdominal pain, left lower quadrant 09/16/2013  . LBP (low back pain) 12/16/2012  . Pelvic and perineal pain 10/25/2012  . Shoulder pain 03/17/2012  . Colitis 01/16/2012  . Abdominal pain 01/16/2012  . Cocaine abuse 09/28/2011  . Dental decay 09/28/2011  . Hormonal disorder 09/28/2011  . Hypokalemia   . Narcotic abuse    Outpatient Encounter Prescriptions as of 11/18/2014  Medication Sig  . amitriptyline (ELAVIL) 50 MG tablet Take 1/2 tablet for first week and then one pill at bedtime.  Marland Kitchen aspirin-acetaminophen-caffeine (EXCEDRIN MIGRAINE) 250-250-65 MG per tablet Take 2 tablets by mouth every 6 (six) hours as needed for migraine.  . diphenhydrAMINE (BENADRYL) 25 MG tablet Take 25-50 mg by mouth every 6 (six) hours as needed for allergies.  Marland Kitchen traMADol (ULTRAM) 50 MG tablet Take 1-2 tablets by mouth every 6 (six) hours as needed. pain  . [DISCONTINUED] ALPRAZolam (XANAX) 0.5 MG tablet Take 1 tablet by mouth 2 (two) times daily.   No facility-administered encounter medications on file as of 11/18/2014.      Review of Systems  Constitutional: Positive for chills. Fever: unknown.  HENT: Positive for congestion.   Eyes: Negative.   Respiratory: Positive for cough.   Cardiovascular: Negative.   Gastrointestinal: Negative.   Endocrine: Negative.   Genitourinary: Negative.   Musculoskeletal: Negative.   Skin: Negative.   Allergic/Immunologic: Negative.   Neurological: Negative.   Hematological: Negative.     Psychiatric/Behavioral: Negative.        Objective:   Physical Exam  Constitutional: She is oriented to person, place, and time. She appears well-developed.  HENT:  Head: Normocephalic and atraumatic.  Right Ear: External ear normal.  Left Ear: External ear normal.  Mouth/Throat: Oropharynx is clear and moist.  There is maxillary sinus tenderness bilaterally with nasal congestion bilaterally  Eyes: Conjunctivae and EOM are normal. Pupils are equal, round, and reactive to light. Right eye exhibits no discharge. Left eye exhibits no discharge. No scleral icterus.  Neck: Normal range of motion. Neck supple. No thyromegaly present.  Cardiovascular: Normal rate, regular rhythm and normal heart sounds.   No murmur heard. Pulmonary/Chest: Effort normal. No respiratory distress. She has no wheezes. She has no rales. She exhibits no tenderness.  Dry irritated cough without rales or wheezes  Abdominal: She exhibits no mass.  Musculoskeletal: Normal range of motion. She exhibits no edema.  Lymphadenopathy:    She has no cervical adenopathy.  Neurological: She is alert and oriented to person, place, and time.  Skin: Skin is warm and dry. No rash noted.  Psychiatric: She has a normal mood and affect. Her behavior is normal. Judgment and thought content normal.  Nursing note and vitals reviewed.  BP 97/70 mmHg  Pulse 80  Temp(Src) 97 F (36.1 C) (Oral)  Ht 5\' 1"  (1.549 m)  Wt 111 lb (50.349 kg)  BMI 20.98 kg/m2        Assessment & Plan:  1. Rhinosinusitis -Use nasal  saline regularly -Take Mucinex whether coughing or not one tablet twice daily for cough and congestion with a large glass of water -Stop smoking - amoxicillin (AMOXIL) 500 MG capsule; Take 1 capsule (500 mg total) by mouth 3 (three) times daily.  Dispense: 30 capsule; Refill: 0  2. Skin infection, bacterial -Clean the affected areas of skin regularly with warm soapy water and apply ointment twice daily. - mupirocin  cream (BACTROBAN) 2 %; Apply 1 application topically 2 (two) times daily.  Dispense: 15 g; Refill: 0  Patient Instructions  Take antibiotic until completed Clean irritated area of skin regularly with warm soapy water Use nasal saline frequently during the day Apply Bactroban ointment or mupirocin ointment to the area of skin beneath the left mandible after cleansing with warm soapy water Drink plenty of fluids and please make all efforts to stop smoking   Nyra Capes MD

## 2014-11-20 ENCOUNTER — Telehealth: Payer: Self-pay | Admitting: Family Medicine

## 2014-11-20 NOTE — Telephone Encounter (Signed)
Patient advised to give the medicine a little longer since she has only been on it for 2 days. Patient advised to call back if she does not notice a improvement in the next 2 days.

## 2014-11-22 ENCOUNTER — Telehealth: Payer: Self-pay | Admitting: *Deleted

## 2014-11-22 DIAGNOSIS — K029 Dental caries, unspecified: Secondary | ICD-10-CM

## 2014-11-22 DIAGNOSIS — K130 Diseases of lips: Secondary | ICD-10-CM

## 2014-11-22 NOTE — Telephone Encounter (Signed)
Patient has what her dentist has called a skin tag on the inner side of her upper lip.  She also has multiple dental caries.  She has been referred to an oral surgeon, Dr Ocie Doyne at [facility]. Her dentist told her that if she had a referral from her MD then her insurance may cover the visit. Patient also requesting a short course of pain medication.  Her dentist did give her a small amount and she is getting tramadol from Dr Gerilyn Pilgrim. She has an appt with him this afternoon. Explained that we would not be able to provide pain medication and that she should follow up with her dentist if some is needed to manage the mouth pain.  Patient stated understanding and agreement to plan.

## 2014-11-22 NOTE — Telephone Encounter (Addendum)
Patient complains of oral pain due to multiple dental caries. She is seeing her dentist this evening. Explained that he would be the appropriate person to give her pain medication for this. Dr. Louanne Skye has refused to give her pain medication and patient is well aware of this. She was pleasant but still pushed for pain meds if her dentist doesn't prescribe it.  I thoroughly explained our position on this and that Dr Dettinger wouldn't be prescribing it. I was unable to end the conversation and ended up giving her an appointment tomorrow to discuss this with Dr Dettinger in person. Once again, she is aware that we won't be writing pain medication. The appt is basically to reiterate this in person.

## 2014-11-23 ENCOUNTER — Ambulatory Visit (INDEPENDENT_AMBULATORY_CARE_PROVIDER_SITE_OTHER): Payer: BLUE CROSS/BLUE SHIELD | Admitting: Family Medicine

## 2014-11-23 ENCOUNTER — Encounter: Payer: Self-pay | Admitting: Family Medicine

## 2014-11-23 VITALS — BP 117/79 | HR 110 | Temp 99.1°F | Ht 61.0 in | Wt 109.4 lb

## 2014-11-23 DIAGNOSIS — K029 Dental caries, unspecified: Secondary | ICD-10-CM

## 2014-11-23 DIAGNOSIS — N907 Vulvar cyst: Secondary | ICD-10-CM | POA: Diagnosis not present

## 2014-11-23 DIAGNOSIS — N9089 Other specified noninflammatory disorders of vulva and perineum: Secondary | ICD-10-CM

## 2014-11-23 NOTE — Progress Notes (Signed)
BP 117/79 mmHg  Pulse 110  Temp(Src) 99.1 F (37.3 C) (Oral)  Ht  (1.549 m)  Wt 109 lb 6.4 oz (49.624 kg)  BMI 20.68 kg/m2   Subjective:    Patient ID: [name], female    DOB: Dec 25, 1980, 34 y.o.   MRN: [id number]  HPI: [name] is a 34 y.o. female presenting on 11/23/2014 for Dental Pain   HPI Dental decay Patient presents today because she has had chronic dental decay and is having a lot of pain and wants some pain meds. She she was just prescribed 12 Vicodin by her dentist yesterday and she used to see Dr. Rhodia Albright for pain management but was fired from them. Her dental decay is extensive and covering all of her teeth throughout her mouth. She also has a skin tag that appears she has been biting under her right upper labia that is painful to her per patient.  Relevant past medical, surgical, family and social history reviewed and updated as indicated. Interim medical history since our last visit reviewed. Allergies and medications reviewed and updated.  Review of Systems  Constitutional: Negative for fever and chills.  HENT: Positive for dental problem and mouth sores (skin tag). Negative for congestion, ear discharge and ear pain.   Eyes: Negative for pain, redness and visual disturbance.  Respiratory: Negative for chest tightness and shortness of breath.   Cardiovascular: Negative for chest pain and leg swelling.  Genitourinary: Negative for dysuria and difficulty urinating.  Musculoskeletal: Negative for back pain and gait problem.  Skin: Negative for rash.  Neurological: Negative for light-headedness and headaches.  Psychiatric/Behavioral: Negative for behavioral problems and agitation.  All other systems reviewed and are negative.   Per HPI unless specifically indicated above     Medication List       This list is accurate as of: 11/23/14  3:44 PM.  Always use your most recent med list.               amitriptyline 50 MG tablet  Commonly known as:   ELAVIL  Take 1/2 tablet for first week and then one pill at bedtime.     amoxicillin 500 MG capsule  Commonly known as:  AMOXIL  Take 1 capsule (500 mg total) by mouth 3 (three) times daily.     aspirin-acetaminophen-caffeine 250-250-65 MG per tablet  Commonly known as:  EXCEDRIN MIGRAINE  Take 2 tablets by mouth every 6 (six) hours as needed for migraine.     diphenhydrAMINE 25 MG tablet  Commonly known as:  BENADRYL  Take 25-50 mg by mouth every 6 (six) hours as needed for allergies.     HYDROcodone-acetaminophen 7.5-325 MG per tablet  Commonly known as:  NORCO  Take 7.5-325 tablets by mouth every 6 (six) hours.     mupirocin cream 2 %  Commonly known as:  BACTROBAN  Apply 1 application topically 2 (two) times daily.     traMADol 50 MG tablet  Commonly known as:  ULTRAM  Take 1-2 tablets by mouth every 6 (six) hours as needed. pain     ZIPSOR 25 MG Caps  Generic drug:  Diclofenac Potassium  Take 25 mg by mouth 4 (four) times daily.           Objective:    BP 117/79 mmHg  Pulse 110  Temp(Src) 99.1 F (37.3 C) (Oral)  Ht  (1.549 m)  Wt 109 lb 6.4 oz (49.624 kg)  BMI 20.68 kg/m2  Wt  Readings from Last 3 Encounters:  11/23/14 109 lb 6.4 oz (49.624 kg)  11/18/14 111 lb (50.349 kg)  11/10/14 112 lb 6.4 oz (50.984 kg)    Physical Exam  Constitutional: She is oriented to person, place, and time. She appears well-developed and well-nourished. No distress.  HENT:  Mouth/Throat: Oropharynx is clear and moist and mucous membranes are normal. Abnormal dentition (very poor dentition with multiple lost teeth.). Dental caries (throughout all of her teeth.) present.    Eyes: Conjunctivae and EOM are normal. Pupils are equal, round, and reactive to light.  Cardiovascular: Normal rate, regular rhythm, normal heart sounds and intact distal pulses.   No murmur heard. Pulmonary/Chest: Effort normal and breath sounds normal. No respiratory distress. She has no wheezes.    Musculoskeletal: Normal range of motion. She exhibits no edema or tenderness.  Neurological: She is alert and oriented to person, place, and time. Coordination normal.  Skin: Skin is warm and dry. No rash noted. She is not diaphoretic.  Psychiatric: She has a normal mood and affect. Her behavior is normal.  Vitals reviewed.   Results for orders placed or performed during the hospital encounter of 10/30/14  CBC with Differential/Platelet  Result Value Ref Range   WBC 11.6 (H) 4.0 - 10.5 K/uL   RBC 3.53 (L) 3.87 - 5.11 MIL/uL   Hemoglobin 12.5 12.0 - 15.0 g/dL   HCT 16.1 09.6 - 04.5 %   MCV 103.1 (H) 78.0 - 100.0 fL   MCH 35.4 (H) 26.0 - 34.0 pg   MCHC 34.3 30.0 - 36.0 g/dL   RDW 40.9 81.1 - 91.4 %   Platelets 199 150 - 400 K/uL   Neutrophils Relative % 50 43 - 77 %   Neutro Abs 5.8 1.7 - 7.7 K/uL   Lymphocytes Relative 40 12 - 46 %   Lymphs Abs 4.6 (H) 0.7 - 4.0 K/uL   Monocytes Relative 6 3 - 12 %   Monocytes Absolute 0.7 0.1 - 1.0 K/uL   Eosinophils Relative 4 0 - 5 %   Eosinophils Absolute 0.4 0.0 - 0.7 K/uL   Basophils Relative 0 0 - 1 %   Basophils Absolute 0.0 0.0 - 0.1 K/uL  Comprehensive metabolic panel  Result Value Ref Range   Sodium 141 135 - 145 mmol/L   Potassium 3.4 (L) 3.5 - 5.1 mmol/L   Chloride 104 101 - 111 mmol/L   CO2 30 22 - 32 mmol/L   Glucose, Bld 91 65 - 99 mg/dL   BUN 9 6 - 20 mg/dL   Creatinine, Ser 7.82 0.44 - 1.00 mg/dL   Calcium 8.1 (L) 8.9 - 10.3 mg/dL   Total Protein 6.2 (L) 6.5 - 8.1 g/dL   Albumin 3.6 3.5 - 5.0 g/dL   AST 18 15 - 41 U/L   ALT 22 14 - 54 U/L   Alkaline Phosphatase 67 38 - 126 U/L   Total Bilirubin 0.1 (L) 0.3 - 1.2 mg/dL   GFR calc non Af Amer >60 >60 mL/min   GFR calc Af Amer >60 >60 mL/min   Anion gap 7 5 - 15  Lipase, blood  Result Value Ref Range   Lipase 24 22 - 51 U/L  Urinalysis, Routine w reflex microscopic (not at [facility])  Result Value Ref Range   Color, Urine YELLOW YELLOW   APPearance CLEAR CLEAR    Specific Gravity, Urine 1.020 1.005 - 1.030   pH 6.0 5.0 - 8.0   Glucose, UA NEGATIVE NEGATIVE mg/dL  Hgb urine dipstick NEGATIVE NEGATIVE   Bilirubin Urine NEGATIVE NEGATIVE   Ketones, ur NEGATIVE NEGATIVE mg/dL   Protein, ur NEGATIVE NEGATIVE mg/dL   Urobilinogen, UA 0.2 0.0 - 1.0 mg/dL   Nitrite NEGATIVE NEGATIVE   Leukocytes, UA NEGATIVE NEGATIVE      Assessment & Plan:   Problem List Items Addressed This Visit      Digestive   Dental decay - Primary    Patient has known dental decay and needs to see oral surgeon, she just got Medicaid and will go see them. Pain medication seeker       Other Visit Diagnoses    Skin tag of labia        Painful skin tag under her right front labia. We will refer to ENT for removal.    Relevant Orders    Ambulatory referral to ENT        Follow up plan: Return if symptoms worsen or fail to improve.  Arville Care, MD [facility] 11/23/2014, 3:44 PM

## 2014-11-23 NOTE — Assessment & Plan Note (Signed)
Patient has known dental decay and needs to see oral surgeon, she just got Medicaid and will go see them. Pain medication seeker

## 2014-11-28 ENCOUNTER — Emergency Department (HOSPITAL_COMMUNITY)
Admission: EM | Admit: 2014-11-28 | Discharge: 2014-11-28 | Disposition: A | Discharge status: Home / Self Care | Payer: BLUE CROSS/BLUE SHIELD | Attending: Emergency Medicine | Admitting: Emergency Medicine

## 2014-11-28 ENCOUNTER — Encounter (HOSPITAL_COMMUNITY): Payer: Self-pay | Admitting: Emergency Medicine

## 2014-11-28 ENCOUNTER — Telehealth: Payer: Self-pay | Admitting: Family Medicine

## 2014-11-28 DIAGNOSIS — G8929 Other chronic pain: Secondary | ICD-10-CM | POA: Insufficient documentation

## 2014-11-28 DIAGNOSIS — Z720 Tobacco use: Secondary | ICD-10-CM | POA: Diagnosis not present

## 2014-11-28 DIAGNOSIS — K029 Dental caries, unspecified: Secondary | ICD-10-CM | POA: Diagnosis not present

## 2014-11-28 DIAGNOSIS — K1379 Other lesions of oral mucosa: Secondary | ICD-10-CM | POA: Insufficient documentation

## 2014-11-28 DIAGNOSIS — K0889 Other specified disorders of teeth and supporting structures: Secondary | ICD-10-CM

## 2014-11-28 DIAGNOSIS — K002 Abnormalities of size and form of teeth: Secondary | ICD-10-CM | POA: Diagnosis not present

## 2014-11-28 DIAGNOSIS — K08409 Partial loss of teeth, unspecified cause, unspecified class: Secondary | ICD-10-CM | POA: Diagnosis not present

## 2014-11-28 DIAGNOSIS — K088 Other specified disorders of teeth and supporting structures: Secondary | ICD-10-CM | POA: Diagnosis present

## 2014-11-28 DIAGNOSIS — R011 Cardiac murmur, unspecified: Secondary | ICD-10-CM | POA: Diagnosis not present

## 2014-11-28 MED ORDER — LIDOCAINE VISCOUS 2 % MT SOLN
15.0000 mL | Freq: Once | OROMUCOSAL | Status: AC
  Administered 2014-11-28: 15 mL via OROMUCOSAL
  Filled 2014-11-28: qty 15

## 2014-11-28 MED ORDER — HYDROMORPHONE HCL 1 MG/ML IJ SOLN
0.5000 mg | Freq: Once | INTRAMUSCULAR | Status: AC
  Administered 2014-11-28: 0.5 mg via INTRAMUSCULAR
  Filled 2014-11-28: qty 1

## 2014-11-28 NOTE — ED Notes (Signed)
Pt reports dental pain for last several weeks. Pt also reports needs a partial plate but reports is currently waiting to see ENT.

## 2014-11-28 NOTE — ED Provider Notes (Signed)
CSN: [id number]     Arrival date & time 11/28/14  1836 History   First MD Initiated Contact with Patient 11/28/14 1935     Chief Complaint  Patient presents with  . Dental Pain     (Consider location/radiation/quality/duration/timing/severity/associated sxs/prior Treatment) The history is provided by the patient.   [name] is a 34 y.o. female presenting with chronic of dental pain secondary to chronic decay for which she is seeing Dr. Delsa Grana in [place] and slowly having dental extractions in anticipation of full dentures.  She was seen by her PCP last week and was placed on amoxicillin and prescribed amitriptyline for pain relief which has not improved her symptoms.  She has also tried ambesol for topical relief which has not been helpful either.  There has been no fevers, chills, nausea or vomiting, also no complaint of difficulty swallowing, although chewing makes pain worse.  She also has developed a chronic mucosal skin tag at her right buccal mucosa which is enlarging and becoming more painful.  She has been referred to ENT by her PCP for definitive excision of this skin tag.  She contacted her PCP today for further pain management and was referred here as he was unable to prescribe her anything additional.        Past Medical History  Diagnosis Date  . Hypokalemia   . Narcotic abuse     5 yrs ago lortab, off few yrs  . Anxiety   . Chronic pain   . Endometriosis   . Chronic headache   . Chronic back pain   . Chronic pelvic pain in female   . Heart murmur     as a child  . Pneumonia     1 years  old  . Hx of opioid abuse   . Renal disorder Cyst on Kidney   Past Surgical History  Procedure Laterality Date  . Tonsillectomy    . Cholecystectomy  2010  . Tubal ligation    . Carpal tunnel release      both  . Incision and drainage abscess N/A 07/14/2014    Procedure: INCISION AND DRAINAGE VULVAR ABSCESS;  Surgeon: [name], MD;  Location: [facility];  Service: Gynecology;   Laterality: N/A;  . Abdominal hysterectomy  2007    left ovary remains  . Laparoscopic salpingo oopherectomy Left 08/15/2014    Procedure: LAPAROSCOPIC LEFT SALPINGO OOPHORECTOMY;  Surgeon: [name], MD;  Location: [facility];  Service: Gynecology;  Laterality: Left;   Family History  Problem Relation Age of Onset  . Stroke Father   . Diverticulitis Father   . Heart disease    . Arthritis    . Diabetes    . Kidney disease    . Hypertension Mother   . Diabetes Mother    Social History  Substance Use Topics  . Smoking status: Current Every Day Smoker -- 1.00 packs/day for 18 years    Types: Cigarettes  . Smokeless tobacco: Never Used  . Alcohol Use: No     Comment: rarely   OB History    Gravida Para Term Preterm AB TAB SAB Ectopic Multiple Living   Review of Systems  Constitutional: Negative for fever.  HENT: Positive for dental problem. Negative for facial swelling and sore throat.   Respiratory: Negative for shortness of breath.   Musculoskeletal: Negative for neck pain and neck stiffness.  Allergies  Fentanyl; Divalproex sodium; Fioricet-codeine; Ibuprofen; and Ketorolac tromethamine  Home Medications   Prior to Admission medications   Medication Sig Start Date End Date Taking? Authorizing Provider  ALPRAZolam Prudy Feeler) 0.5 MG tablet Take 1 tablet by mouth 2 (two) times daily. 11/22/14  Yes Historical Provider, MD  amitriptyline (ELAVIL) 50 MG tablet Take 1/2 tablet for first week and then one pill at bedtime. Patient taking differently: Take 50 mg by mouth at bedtime.  11/10/14  Yes Elige Radon Dettinger, MD  amoxicillin (AMOXIL) 500 MG capsule Take 1 capsule (500 mg total) by mouth 3 (three) times daily. 11/18/14  Yes Ernestina Penna, MD  aspirin-acetaminophen-caffeine (EXCEDRIN MIGRAINE) [phone] MG per tablet Take 2 tablets by mouth every 6 (six) hours as needed for migraine.   Yes Historical Provider, MD  diphenhydrAMINE (BENADRYL) 25 MG  tablet Take 25-50 mg by mouth every 6 (six) hours as needed for allergies.   Yes Historical Provider, MD  penicillin v potassium (VEETID) 500 MG tablet Take 500 mg by mouth 4 (four) times daily. 7 DAY COURSE STARTING ON 11/22/14 11/22/14  Yes Historical Provider, MD  traMADol (ULTRAM) 50 MG tablet Take 1-2 tablets by mouth every 6 (six) hours as needed. pain 09/26/14  Yes Historical Provider, MD  mupirocin cream (BACTROBAN) 2 % Apply 1 application topically 2 (two) times daily. Patient not taking: Reported on 11/28/2014 11/18/14   Ernestina Penna, MD   BP 126/90 mmHg  Pulse 77  Temp(Src) 97.7 F (36.5 C) (Oral)  Resp 18  Ht  (1.549 m)  Wt 110 lb (49.896 kg)  BMI 20.80 kg/m2  SpO2 100% Physical Exam  Constitutional: She is oriented to person, place, and time. She appears well-developed and well-nourished. No distress.  HENT:  Head: Normocephalic and atraumatic.  Right Ear: Tympanic membrane and external ear normal.  Left Ear: Tympanic membrane and external ear normal.  Mouth/Throat: Oropharynx is clear and moist and mucous membranes are normal. No oral lesions. No trismus in the jaw. Abnormal dentition. Dental caries present. No dental abscesses.  Patient with multiple extractions, decay noted along right lower lateral incisors and premolars along the gingival border.  There is a fleshy colored skin tag at her right upper buccal mucosa.  No gingival edema, fluctuance or signs of dental abscess.  Eyes: Conjunctivae are normal.  Neck: Normal range of motion. Neck supple.  Cardiovascular: Normal rate and normal heart sounds.   Pulmonary/Chest: Effort normal.  Abdominal: She exhibits no distension.  Musculoskeletal: Normal range of motion.  Lymphadenopathy:    She has no cervical adenopathy.  Neurological: She is alert and oriented to person, place, and time.  Skin: Skin is warm and dry. No erythema.  Psychiatric: She has a normal mood and affect.    ED Course  Procedures (including  critical care time) Labs Review Labs Reviewed - No data to display  Imaging Review No results found. I have personally reviewed and evaluated these images and lab results as part of my medical decision-making.   EKG Interpretation None      MDM   Final diagnoses:  Pain, dental    Explained to patient that she will not be receiving narcotic pain medication for this chronic condition.  She is currently taking tramadol 2 tablets every 4-6 hours for chronic pain as prescribed by Dr. Gerilyn Pilgrim.  Patient is a frequent visitor of our emergency department.  She was given a 1 time 0.5 mg Dilaudid injection.  Husband driving home.  Advised patient that she must follow-up with her PCP or her dentist, she will plan to call her dentist in the morning for further evaluation and management of her dental problems and pain.  She was given lidocaine 4% viscous solution and will apply topically every 3 hours.    Burgess Amor, PA-C 11/28/14 2044  Mancel Bale, MD 11/28/14 [phone]

## 2014-11-28 NOTE — Telephone Encounter (Signed)
Patient calling twice during pm clinic requesting more pain medications. Dental pain and back pain and  ran out of pills.  Need more pills to help with pain until she can get back with a dentist or make it till appointment with pain clinic.  I explained that her earlier four messages had been delivered to a provider and we would call her back with the response.  She ask me to see if the pm doctor would give her a script.  I suggested that since she was in [place] and having such severe pain she may need to go to ED.  Pm doctor would not ok script for pain medication.

## 2014-11-28 NOTE — Discharge Instructions (Signed)
Dental Pain A tooth ache may be caused by cavities (tooth decay). Cavities expose the nerve of the tooth to air and hot or cold temperatures. It may come from an infection or abscess (also called a boil or furuncle) around your tooth. It is also often caused by dental caries (tooth decay). This causes the pain you are having. DIAGNOSIS  Your caregiver can diagnose this problem by exam. TREATMENT   If caused by an infection, it may be treated with medications which kill germs (antibiotics) and pain medications as prescribed by your caregiver. Take medications as directed.  Only take over-the-counter or prescription medicines for pain, discomfort, or fever as directed by your caregiver.  Whether the tooth ache today is caused by infection or dental disease, you should see your dentist as soon as possible for further care. SEEK MEDICAL CARE IF: The exam and treatment you received today has been provided on an emergency basis only. This is not a substitute for complete medical or dental care. If your problem worsens or new problems (symptoms) appear, and you are unable to meet with your dentist, call or return to this location. SEEK IMMEDIATE MEDICAL CARE IF:   You have a fever.  You develop redness and swelling of your face, jaw, or neck.  You are unable to open your mouth.  You have severe pain uncontrolled by pain medicine. MAKE SURE YOU:   Understand these instructions.  Will watch your condition.  Will get help right away if you are not doing well or get worse. Document Released: 02/24/2005 Document Revised: 05/19/2011 Document Reviewed: 10/13/2007 [facility] Patient Information 2015 [place], [place]. This information is not intended to replace advice given to you by your health care provider. Make sure you discuss any questions you have with your health care provider.  You may apply the lidocaine using a qtip every 3 hours if needed for pain relief.

## 2014-11-29 ENCOUNTER — Telehealth: Payer: Self-pay

## 2014-11-29 NOTE — Telephone Encounter (Signed)
Patient informed and voices understanding, does not want dental injection at this time

## 2014-11-29 NOTE — Telephone Encounter (Signed)
Unfortunately we do not feel comfortable prescribing anything further at this point. If she is having significant oral pain as I offered her the other day she can come in and I will do an injection which will at least give her 24 hours of relief.  We received 2 other calls around the same time and I responded to most of the others as well, so to make sure we don't call her 3 times. Arville Care, MD [facility] 11/29/2014, 7:50 AM

## 2014-11-29 NOTE — Telephone Encounter (Signed)
Patient had an Appt yesterday at [facility] and she did not have her co pay so they asked her to reschedule.  She asked for RX for pain meds and when refused did not reschedule They looked under [facility] and saw that the patient went to the ER last night asking for pain meds  Dr Pollyann Kennedy will not see this patient

## 2014-11-29 NOTE — Telephone Encounter (Signed)
Patient informed and voices understanding, does not want dental injection 

## 2014-11-29 NOTE — Telephone Encounter (Signed)
Unfortunately we do not feel comfortable prescribing anything further at this point. If she is having significant oral pain as I offered her the other day she can come in and I will do an injection which will at least give her 24 hours of relief.  We received 2 other calls around the same time and I responded to most of the others as well, so to make sure we don't call her 3 times. Joshua Dettinger, MD [facility] 11/29/2014, 7:50 AM     

## 2014-11-29 NOTE — Telephone Encounter (Signed)
Okay; thanks.

## 2014-11-29 NOTE — Telephone Encounter (Signed)
Patient informed and voices understanding, does not want dental injection

## 2014-11-29 NOTE — Telephone Encounter (Signed)
Unfortunately we do not feel comfortable prescribing anything further at this point. If she is having significant oral pain as I offered her the other day she can come in and I will do an injection which will at least give her 24 hours of relief. Arville Care, MD [facility] 11/29/2014, 7:50 AM

## 2014-12-04 ENCOUNTER — Encounter: Payer: Self-pay | Admitting: Obstetrics and Gynecology

## 2014-12-04 ENCOUNTER — Ambulatory Visit (INDEPENDENT_AMBULATORY_CARE_PROVIDER_SITE_OTHER): Payer: BLUE CROSS/BLUE SHIELD | Admitting: Obstetrics and Gynecology

## 2014-12-04 ENCOUNTER — Telehealth: Payer: Self-pay | Admitting: Family Medicine

## 2014-12-04 VITALS — BP 96/60 | Ht 61.0 in | Wt 109.0 lb

## 2014-12-04 DIAGNOSIS — N764 Abscess of vulva: Secondary | ICD-10-CM

## 2014-12-04 DIAGNOSIS — N941 Dyspareunia: Secondary | ICD-10-CM | POA: Diagnosis not present

## 2014-12-04 DIAGNOSIS — R102 Pelvic and perineal pain: Secondary | ICD-10-CM

## 2014-12-04 MED ORDER — TRAMADOL HCL 50 MG PO TABS: 50.0000 mg | ORAL_TABLET | Freq: Four times a day (QID) | ORAL | Status: DC | PRN

## 2014-12-04 NOTE — Progress Notes (Signed)
Patient ID: [name], female   DOB: December 10, 1980, 34 y.o.   MRN: [id number]    [facility] Clinic Visit  Patient name: [name] MRN [id number]  Date of birth: 12/30/1980  CC & HPI:  [name] is a 34 y.o. female s/p abdominal hysterectomy, bilateral salpingectomy and bilateral oophorectomy presenting today for constant, moderate dyspareunia that occurs with penetration. She notes that pain is located at the vaginal entrance and inside vagina. Pt was taking replacement hormones, but stopped allegedly due to mood swings. She has an appointment with a pain clinic on 10/14, having been referred there by her primary care doctor, Dr. Gerilyn Pilgrim. Pt was last sexually active 3 days ago.  ROS:  A complete 10 system review of systems was obtained and all systems are negative except as noted in the HPI and PMH.   Pertinent History Reviewed:   Reviewed: Significant for abdominal hysterectomy and bilateral oophorectomy and bilatreal salpingectomy Medical         Past Medical History  Diagnosis Date  . Hypokalemia   . Narcotic abuse     5 yrs ago lortab, off few yrs  . Anxiety   . Chronic pain   . Endometriosis   . Chronic headache   . Chronic back pain   . Chronic pelvic pain in female   . Heart murmur     as a child  . Pneumonia     35 years  old  . Hx of opioid abuse   . Renal disorder Cyst on Kidney                              Surgical Hx:    Past Surgical History  Procedure Laterality Date  . Tonsillectomy    . Cholecystectomy  2010  . Tubal ligation    . Carpal tunnel release      both  . Incision and drainage abscess N/A 07/14/2014    Procedure: INCISION AND DRAINAGE VULVAR ABSCESS;  Surgeon: [name], MD;  Location: [facility];  Service: Gynecology;  Laterality: N/A;  . Abdominal hysterectomy  2007    left ovary remains  . Laparoscopic salpingo oopherectomy Left 08/15/2014    Procedure: LAPAROSCOPIC LEFT SALPINGO OOPHORECTOMY;  Surgeon: [name], MD;  Location: [facility];  Service: Gynecology;  Laterality: Left;   Medications: Reviewed & Updated - see associated section                       Current outpatient prescriptions:  .  ALPRAZolam (XANAX) 0.5 MG tablet, Take 1 tablet by mouth 2 (two) times daily., Disp: , Rfl: 0 .  amitriptyline (ELAVIL) 50 MG tablet, Take 1/2 tablet for first week and then one pill at bedtime. (Patient taking differently: Take 50 mg by mouth at bedtime. ), Disp: 30 tablet, Rfl: 1 .  aspirin-acetaminophen-caffeine (EXCEDRIN MIGRAINE) 250-250-65 MG per tablet, Take 2 tablets by mouth every 6 (six) hours as needed for migraine., Disp: , Rfl:  .  diphenhydrAMINE (BENADRYL) 25 MG tablet, Take 25-50 mg by mouth every 6 (six) hours as needed for allergies., Disp: , Rfl:  .  traMADol (ULTRAM) 50 MG tablet, Take 1-2 tablets by mouth every 6 (six) hours as needed. pain, Disp: , Rfl: 0 .  [DISCONTINUED] POTASSIUM PO, Take 1 tablet by mouth daily.  , Disp: , Rfl:    Social History: Reviewed -  reports that she  has been smoking Cigarettes.  She has a 18 pack-year smoking history. She has never used smokeless tobacco.  Objective Findings:  Vitals: Blood pressure 96/60, height  (1.549 m), weight 109 lb (49.442 kg).  Physical Examination: General appearance - alert, well appearing, and in no distress and oriented to person, place, and time Mental status - alert, oriented to person, place, and time, normal mood, behavior, speech, dress, motor activity, and thought processes Pelvic - normal external genitalia, vulva, vagina VULVA: normal appearing vulva with no masses, tenderness or lesions VAGINA: normal appearing vagina with normal color and discharge, no lesions, mildly thin tissues; moderate 5/10 pain on left vaginal side wall, right side wall more severe; top vagina appears normal, well-healed, somewhat fibrotic  CERVIX: surgically absent UTERUS: surgically absent  ADNEXA: surgically absent  Assessment & Plan:   A:  1. Moderate  tenderness to palpation vaginal sidewalls to vaginal side walls, chronic pelvic sensitivity= 2. Dyspareunia 3. 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 Chronic opiate dependency  P:  1. Pt to resume  Estrogen 2. Will not give patient requested new analgesics patient agreed to accept a refill on her tramadol reluctantly, but then we received a phone call from [facility] indicating that the patient had a refill for 120 tramadol still available to her pharmacy therefore canceled my prescription  This chart was scribed for [name], MD by Gwenyth Ober, Medical Scribe. This patient was seen in room 1 and the patient's care was started at 3:23 PM.   I personally performed the services described in this documentation, which was SCRIBED in my presence. The recorded information has been reviewed and considered accurate. It has been edited as necessary during review. [name], MD

## 2014-12-04 NOTE — Progress Notes (Signed)
Patient ID: [name], female   DOB: June 21, 1980, 34 y.o.   MRN: [id number] Pt states that she is having pain in her lower pelvic area and on her lower right side. Pt states that she has had some discharge as well. Pt states that she is still having issues with her stomach as well.

## 2014-12-06 NOTE — Telephone Encounter (Signed)
Inform her that we will do oral injections to give her 24 hours but will not prescribe the pain medication as we discussed previously

## 2014-12-07 ENCOUNTER — Encounter (HOSPITAL_COMMUNITY): Payer: Self-pay | Admitting: *Deleted

## 2014-12-07 ENCOUNTER — Emergency Department (HOSPITAL_COMMUNITY): Payer: BLUE CROSS/BLUE SHIELD

## 2014-12-07 ENCOUNTER — Emergency Department (HOSPITAL_COMMUNITY)
Admission: EM | Admit: 2014-12-07 | Discharge: 2014-12-07 | Disposition: A | Discharge status: Home / Self Care | Payer: BLUE CROSS/BLUE SHIELD | Attending: Emergency Medicine | Admitting: Emergency Medicine

## 2014-12-07 DIAGNOSIS — F1310 Sedative, hypnotic or anxiolytic abuse, uncomplicated: Secondary | ICD-10-CM | POA: Insufficient documentation

## 2014-12-07 DIAGNOSIS — F419 Anxiety disorder, unspecified: Secondary | ICD-10-CM | POA: Diagnosis not present

## 2014-12-07 DIAGNOSIS — Z8701 Personal history of pneumonia (recurrent): Secondary | ICD-10-CM | POA: Insufficient documentation

## 2014-12-07 DIAGNOSIS — R011 Cardiac murmur, unspecified: Secondary | ICD-10-CM | POA: Diagnosis not present

## 2014-12-07 DIAGNOSIS — Z87448 Personal history of other diseases of urinary system: Secondary | ICD-10-CM | POA: Diagnosis not present

## 2014-12-07 DIAGNOSIS — S0181XA Laceration without foreign body of other part of head, initial encounter: Secondary | ICD-10-CM | POA: Insufficient documentation

## 2014-12-07 DIAGNOSIS — Y9289 Other specified places as the place of occurrence of the external cause: Secondary | ICD-10-CM | POA: Diagnosis not present

## 2014-12-07 DIAGNOSIS — Y998 Other external cause status: Secondary | ICD-10-CM | POA: Insufficient documentation

## 2014-12-07 DIAGNOSIS — Y9389 Activity, other specified: Secondary | ICD-10-CM | POA: Diagnosis not present

## 2014-12-07 DIAGNOSIS — F1410 Cocaine abuse, uncomplicated: Secondary | ICD-10-CM | POA: Diagnosis not present

## 2014-12-07 DIAGNOSIS — Z23 Encounter for immunization: Secondary | ICD-10-CM | POA: Insufficient documentation

## 2014-12-07 DIAGNOSIS — F1110 Opioid abuse, uncomplicated: Secondary | ICD-10-CM | POA: Diagnosis not present

## 2014-12-07 DIAGNOSIS — Z720 Tobacco use: Secondary | ICD-10-CM | POA: Insufficient documentation

## 2014-12-07 DIAGNOSIS — W01198A Fall on same level from slipping, tripping and stumbling with subsequent striking against other object, initial encounter: Secondary | ICD-10-CM | POA: Diagnosis not present

## 2014-12-07 DIAGNOSIS — Z79899 Other long term (current) drug therapy: Secondary | ICD-10-CM | POA: Diagnosis not present

## 2014-12-07 DIAGNOSIS — Z8742 Personal history of other diseases of the female genital tract: Secondary | ICD-10-CM | POA: Diagnosis not present

## 2014-12-07 DIAGNOSIS — G8929 Other chronic pain: Secondary | ICD-10-CM | POA: Diagnosis not present

## 2014-12-07 DIAGNOSIS — R569 Unspecified convulsions: Secondary | ICD-10-CM | POA: Diagnosis not present

## 2014-12-07 DIAGNOSIS — Z8639 Personal history of other endocrine, nutritional and metabolic disease: Secondary | ICD-10-CM | POA: Insufficient documentation

## 2014-12-07 DIAGNOSIS — S0191XA Laceration without foreign body of unspecified part of head, initial encounter: Secondary | ICD-10-CM

## 2014-12-07 DIAGNOSIS — F1910 Other psychoactive substance abuse, uncomplicated: Secondary | ICD-10-CM

## 2014-12-07 LAB — COMPREHENSIVE METABOLIC PANEL
ALK PHOS: 168 U/L — AB (ref 38–126)
ALT: 61 U/L — ABNORMAL HIGH (ref 14–54)
ANION GAP: 13 (ref 5–15)
AST: 64 U/L — ABNORMAL HIGH (ref 15–41)
Albumin: 4.2 g/dL (ref 3.5–5.0)
BILIRUBIN TOTAL: 0.4 mg/dL (ref 0.3–1.2)
BUN: <5 mg/dL — ABNORMAL LOW (ref 6–20)
CALCIUM: 9.1 mg/dL (ref 8.9–10.3)
CO2: 25 mmol/L (ref 22–32)
Chloride: 98 mmol/L — ABNORMAL LOW (ref 101–111)
Creatinine, Ser: 0.71 mg/dL (ref 0.44–1.00)
Glucose, Bld: 86 mg/dL (ref 65–99)
Potassium: 3.2 mmol/L — ABNORMAL LOW (ref 3.5–5.1)
SODIUM: 136 mmol/L (ref 135–145)
TOTAL PROTEIN: 7.8 g/dL (ref 6.5–8.1)

## 2014-12-07 LAB — URINE MICROSCOPIC-ADD ON

## 2014-12-07 LAB — URINALYSIS, ROUTINE W REFLEX MICROSCOPIC
Bilirubin Urine: NEGATIVE
GLUCOSE, UA: NEGATIVE mg/dL
Ketones, ur: NEGATIVE mg/dL
LEUKOCYTES UA: NEGATIVE
Nitrite: NEGATIVE
PH: 6.5 (ref 5.0–8.0)
Protein, ur: NEGATIVE mg/dL
Urobilinogen, UA: 0.2 mg/dL (ref 0.0–1.0)

## 2014-12-07 LAB — SALICYLATE LEVEL

## 2014-12-07 LAB — RAPID URINE DRUG SCREEN, HOSP PERFORMED
AMPHETAMINES: NOT DETECTED
BENZODIAZEPINES: POSITIVE — AB
Barbiturates: NOT DETECTED
Cocaine: POSITIVE — AB
Opiates: POSITIVE — AB
TETRAHYDROCANNABINOL: NOT DETECTED

## 2014-12-07 LAB — RAPID HIV SCREEN (HIV 1/2 AB+AG)
HIV 1/2 ANTIBODIES: NONREACTIVE
HIV-1 P24 ANTIGEN - HIV24: NONREACTIVE

## 2014-12-07 LAB — ETHANOL: ALCOHOL ETHYL (B): 8 mg/dL — AB (ref <5)

## 2014-12-07 LAB — LACTIC ACID, PLASMA
LACTIC ACID, VENOUS: 3.8 mmol/L — AB (ref 0.5–2.0)
Lactic Acid, Venous: 1.5 mmol/L (ref 0.5–2.0)

## 2014-12-07 LAB — ACETAMINOPHEN LEVEL: Acetaminophen (Tylenol), Serum: <10 ug/mL — ABNORMAL LOW (ref 10–30)

## 2014-12-07 MED ORDER — ASPIRIN-ACETAMINOPHEN-CAFFEINE 250-250-65 MG PO TABS
2.0000 | ORAL_TABLET | Freq: Once | ORAL | Status: DC
  Filled 2014-12-07: qty 2

## 2014-12-07 MED ORDER — POVIDONE-IODINE 10 % EX SOLN
CUTANEOUS | Status: AC
  Filled 2014-12-07: qty 118

## 2014-12-07 MED ORDER — TETANUS-DIPHTH-ACELL PERTUSSIS 5-2.5-18.5 LF-MCG/0.5 IM SUSP
0.5000 mL | Freq: Once | INTRAMUSCULAR | Status: AC
  Administered 2014-12-07: 0.5 mL via INTRAMUSCULAR
  Filled 2014-12-07: qty 0.5

## 2014-12-07 MED ORDER — OXYMETAZOLINE HCL 0.05 % NA SOLN
1.0000 | Freq: Once | NASAL | Status: AC
  Administered 2014-12-07: 1 via NASAL
  Filled 2014-12-07: qty 15

## 2014-12-07 MED ORDER — SODIUM CHLORIDE 0.9 % IV BOLUS (SEPSIS)
1000.0000 mL | Freq: Once | INTRAVENOUS | Status: AC
  Administered 2014-12-07: 1000 mL via INTRAVENOUS

## 2014-12-07 MED ORDER — LORAZEPAM 1 MG PO TABS
2.0000 mg | ORAL_TABLET | Freq: Once | ORAL | Status: AC
  Administered 2014-12-07: 2 mg via ORAL
  Filled 2014-12-07: qty 2

## 2014-12-07 MED ORDER — ACETAMINOPHEN 325 MG PO TABS
650.0000 mg | ORAL_TABLET | Freq: Once | ORAL | Status: AC
  Administered 2014-12-07: 650 mg via ORAL
  Filled 2014-12-07: qty 2

## 2014-12-07 MED ORDER — LIDOCAINE-EPINEPHRINE (PF) 2 %-1:200000 IJ SOLN
20.0000 mL | Freq: Once | INTRAMUSCULAR | Status: AC
  Administered 2014-12-07: 20 mL
  Filled 2014-12-07: qty 20

## 2014-12-07 MED ORDER — ONDANSETRON HCL 4 MG/2ML IJ SOLN
4.0000 mg | Freq: Once | INTRAMUSCULAR | Status: AC
  Administered 2014-12-07: 4 mg via INTRAVENOUS
  Filled 2014-12-07: qty 2

## 2014-12-07 NOTE — ED Notes (Signed)
Pt alert & oriented x4, stable gait. Patient given discharge instructions, paperwork & prescription(s). Patient  instructed to stop at the registration desk to finish any additional paperwork. Patient verbalized understanding. Pt left department w/ no further questions. 

## 2014-12-07 NOTE — ED Notes (Signed)
Pt states she did smoke crack today but does not want her husband to know. States that she had the seizure shortly after smoking.

## 2014-12-07 NOTE — Telephone Encounter (Signed)
Patient has appointment on 9/30 with Dettinger.

## 2014-12-07 NOTE — ED Notes (Signed)
TTS in process 

## 2014-12-07 NOTE — ED Notes (Signed)
Pt asking when she can have some more ativan. Pt informed it was a one time order & no further orders at this time.
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
## 2014-12-07 NOTE — ED Notes (Signed)
CRITICAL VALUE ALERT  Critical value received:  Lactic Acid 3.8  Date of notification:  12/07/14  Time of notification:  1738  Critical value read back:Yes.    Nurse who received alert:  Irish Elders RN  MD notified (1st page):  Little  Time of first page:  1738  MD notified (2nd page):  Time of second page:  Responding MD:  Little  Time MD responded:  1739

## 2014-12-07 NOTE — ED Notes (Signed)
Per EMS, Pt was at pawn shop attempting to get rid of cellphones in order to have money for pain pills. Pt had a seizure and was initially postictal on scene. Large laceration to forehead. Dressing remains in place. Pt states she wants help with opiates and states she smoke a cigarette and then felt really dizzy and states she wants to be checked for crack also. Pt is alert and answering questions appropriately at this time.

## 2014-12-07 NOTE — Discharge Instructions (Signed)
°Emergency Department Resource Guide °1) Find a Doctor and Pay Out of Pocket °Although you won't have to find out who is covered by your insurance plan, it is a good idea to ask around and get recommendations. You will then need to call the office and see if the doctor you have chosen will accept you as a new patient and what types of options they offer for patients who are self-pay. Some doctors offer discounts or will set up payment plans for their patients who do not have insurance, but you will need to ask so you aren't surprised when you get to your appointment. ° °2) Contact Your Local Health Department °Not all health departments have doctors that can see patients for sick visits, but many do, so it is worth a call to see if yours does. If you don't know where your local health department is, you can check in your phone book. The CDC also has a tool to help you locate your state's health department, and many state websites also have listings of all of their local health departments. ° °3) Find a Walk-in Clinic °If your illness is not likely to be very severe or complicated, you may want to try a walk in clinic. These are popping up all over the country in pharmacies, drugstores, and shopping centers. They're usually staffed by nurse practitioners or physician assistants that have been trained to treat common illnesses and complaints. They're usually fairly quick and inexpensive. However, if you have serious medical issues or chronic medical problems, these are probably not your best option. ° °No Primary Care Doctor: °- Call [facility] at  832-8000 - they can help you locate a primary care doctor that  accepts your insurance, provides certain services, etc. °- Physician Referral Service- 1-800-533-3463 ° °Chronic Pain Problems: °Organization         Address  Phone   Notes  °[facility]  (336) 297-2271 Patients need to be referred by their primary care doctor.  ° °Medication  Assistance: °Organization         Address  Phone   Notes  °[place] [facility] [address] °[place], [place] [address] (336) 641-8030 --Must be a resident of [place] °-- Must have NO insurance coverage whatsoever (no Medicaid/ Medicare, etc.) °-- The pt. MUST have a primary care doctor that directs their care regularly and follows them in the community °  °[facility]  (866) 331-1348   °[employer]  (888) 892-1162   ° °Agencies that provide inexpensive medical care: °Organization         Address  Phone   Notes  °[facility]  (336) 832-8035   °[facility]    (336) 832-7272   °[facility] [address] °[place], [place] [address] (336) 832-4777   °[facility] [address], °[place] (336) 271-4999   °[facility]    (336) 373-0678   °[facility]    (336) 272-1050   °[facility] ° [address], [place] Phone:  (336) 832-4444, Fax:  (336) 832-4440 Hours of Operation:  9 am - 6 pm, M-F.  Also accepts Medicaid/Medicare and self-pay.  °[facility] ° [address], [place] Phone: (336) 832-3150, Fax: (336) 832-3151. Hours of Operation:  8:30 am - 5:30 pm, M-F.  Also accepts Medicaid and self-pay.  °[facility] [place] [address], [place] Phone: (336) 878-6027   °[facility] [address], [place], [place] (336)723-1848, Ext. 123 Mondays & Thursdays: 7-9 AM.  First 15 patients are seen on a first come, first serve basis. °  ° °Medicaid-accepting [place] Providers: ° °Organization         Address  Phone   Notes  °[facility] [address], [place] (336) 641-2100 Also accepts self-pay patients.  °[facility] [address], [place] ° (336) 856-9996   °[facility] [address], [place]  (336) 288-8857   °[facility] [address] [place] [address], [place] (336) 299-7000   °Veita Bland [address], [place]  ° (336) 373-1557 Only accepts [place] Medicaid patients after they have their name applied to their card.  ° °Self-Pay (no insurance) in [place]: ° °Organization         Address  Phone   Notes  °Sickle Cell Patients, [facility] [address], [place] (336) 832-1970   °[facility] [address], [place] (336) 832-4400   °[facility] ° [address], [place] (336) 992-4800   °[facility]/Dr. Osei-Bonsu ° [address] [place] [address], [place] or [address], [place] (336) 841-8500 Phone number for both [place] and [place] locations is the same.  °[facility] [address], [place] (336) 299-0000   °[facility] [address] [place] [address], [place] or [address] (336) 852-7530 °(336) 878-2260   °[facility] [address], [place] (336) 350-1642, phone; (336) 294-5005, fax Sees patients 1st and 3rd Saturday of every month.  Must not qualify for public or private insurance (i.e. Medicaid, Medicare, [place] Health Choice, Veterans' Benefits) • Household income should be no more than 200% of the poverty level •The clinic cannot treat you if you are pregnant or think you are pregnant • Sexually transmitted diseases are not treated at the clinic.  ° ° °Dental Care: °Organization         Address  Phone  Notes  °[place] [facility] [address], [place] (336) 641-6152 Accepts children up to age 21 who are enrolled in Medicaid or [place] Health Choice; pregnant women with a Medicaid card; and children who have applied for Medicaid or [place] Health Choice, but were declined, whose parents can pay a reduced fee at time of service.  °[place]  [facility] [place]  [address], [place] (336) 641-7733 Accepts children up to age 21 who are enrolled in Medicaid or [place] Health Choice; pregnant women with a Medicaid card; and children who have applied for Medicaid or  Health Choice, but were declined, whose parents can pay a reduced fee at time of service.  °[facility] ° [address],  (336) 641-4533 Patients are seen by appointment only. Walk-ins are not accepted. [facility] will see patients 18 years of age and older. °Monday - Tuesday (8am-5pm) °Most Wednesdays (8:30-5pm) °$30 per visit, cash only  °[facility] ° [address], [place] (336) 641-4533 Patients are seen by appointment only. Walk-ins are not accepted. [facility] will see patients 18 years of age and older. °One   Wednesday Evening (Monthly: Volunteer Based).  $30 per visit, cash only  °[facility]  (919) 537-3737 for adults; Children under age 4, call Graduate Pediatric Dentistry at ([phone]) [phone]. Children aged 4-14, please call (919) [phone] to request a pediatric application. ° Dental services are provided in all areas of dental care including fillings, crowns and bridges, complete and partial dentures, implants, gum treatment, root canals, and extractions. Preventive care is also provided. Treatment is provided to both adults and children. °Patients are selected via a lottery and there is often a waiting list. °  °[facility] [address], °[place] ° (336) 763-8833 [email or web address] °  °[facility] [address], [place], [place] (336)723-1848, Ext. 123 Second and Fourth Thursday of each month, opens at 6:30 AM; Clinic ends at 9 AM.  Patients are seen on a first-come first-served basis, and a limited number are seen during each clinic.  ° °[facility] ° [address], [place], [place] (336) 723-7904    Eligibility Requirements °You must have lived in [place], [place], or [place] counties for at least the last three months. °  You cannot be eligible for state or federal sponsored healthcare insurance, including Veterans Administration, Medicaid, or Medicare. °  You generally cannot be eligible for healthcare insurance through your employer.  °  How to apply: °Eligibility screenings are held every Tuesday and Wednesday afternoon from 1:00 pm until 4:00 pm. You do not need an appointment for the interview!  °[facility] [address], [place], [place] 336-631-2330   °[facility]  336-342-8273   °[facility]  336-703-3100   °[facility]  336-570-6415   ° °Behavioral Health Resources in the Community: °Intensive Outpatient Programs °Organization         Address  Phone  Notes  °[facility] [address], [place], [place] 336-878-6098   °[facility] [address], [place], [place] 336-832-9800   °[facility] [address], [place], [place] ° 336-882-2125   °[facility] [address],  °[place], [place] 1-800-853-5163 or 336-641-4981   °Substance Abuse Resources °Organization         Address  Phone  Notes  °[facility]  336-882-2125   °[facility]  336-784-9470   °[facility]  336-285-9073   °[facility]  336-845-3988   °Residential & Outpatient Substance Abuse Program  1-800-659-3381   °Psychological Services °Organization         Address  Phone  Notes  °[facility]  336- 832-9600   °[facility]  336- 378-7881   °[facility] [address], [place] 1-800-853-5163 or 336-641-4981   ° °Mobile Crisis Teams °Organization         Address  Phone  Notes  °[facility], Mobile Crisis Care Unit  1-877-626-1772   °Assertive °[facility] ° [address]  [place], [place] 336-834-9664   °Sharon DeEsch [address] ° [place] 336-554-5454   ° °Self-Help/Support Groups °Organization         Address  Phone             Notes  °[facility]  - variety of support groups  336- 373-1402 Call for more information  °Narcotics Anonymous (NA), [facility] [address], °[place]  2 meetings at this location  ° °  Residential Treatment Programs °Organization         Address  Phone  Notes  °[facility] [address],    °[place]  1-866-801-8205   °[facility] ° [address], [place], [place] 704-293-8524   °[facility] [address], [place] 336-845-3988 Admissions: 8am-3pm M-F  °[facility] [address]    °[place], [place] 336-841-1104   °[facility] [address], [place], [place] 336-379-7146   °[facility] [address]  °[place], [place] 336-285-9073   °[facility] Intensive Outpatient [address], [place], [place] 336-852-3033   °[facility] [address]  °[place], [place] 1-877-615-2722 or 336-784-9470   °[facility] [address] [place], [place] 336-227-7417 Accepts Medicaid  °[facility] [address]  °[place] 1-800-659-3381 Substance Abuse/Addiction Treatment  ° °[place] Behavioral Health Resources °Organization         Address  Phone  Notes  °[facility]  (888) 581-9988   °Julie Brannon, PhD [address] [place], [place]   (336) 349-5553 or (336) 951-0000   °[facility]   [address] °[place], [place] (336) 349-4454   °[facility] [address], [place], [place] (336) 342-8316 Insurance/Medicaid/sponsorship through [facility]  °[facility] [address]                                    [place], [place] (336) 342-8316 Therapy/tele-psych/case    °[facility] [address]  ° [place], [place] (336) 349-2233    °Dr. Arfeen  (336) 349-4544   °[facility] [place]  [employer] [place] [facility] 1) [address], [place] °2) [address], [place] °3)  [address], [place] (336) 349-3220 °(336) 342-7768 ° °(336) 342-8140   °[place] [facility] (336) 342-1394 or (336) 342-3537 (After Hours)    ° ° °

## 2014-12-07 NOTE — ED Provider Notes (Signed)
CSN: [id number]     Arrival date & time 12/07/14  1557 History   First MD Initiated Contact with Patient 12/07/14 1601     Chief Complaint  Patient presents with  . Seizures  . Laceration     (Consider location/radiation/quality/duration/timing/severity/associated sxs/prior Treatment) HPI Comments: 34 year old female with past medical history including chronic pain, polysubstance abuse who presents with fall and head laceration. Patient states that she does not recall details of event but was at a pawn shop when she lost consciousness and reportedly had seizure-like activity. She struck her head while falling and sustained a laceration to her forehead. EMS reports that the patient was trying to sell cell phones in order to get money for pain pills. Patient requests help with opiate detox. She states that she smoked a cigarette earlier today and thinks that it was laced with something else. She endorses severe headache but denies any visual problems. No extremity numbness or weakness.  Bystander at store reports that the patient had received some money and came back 10 minutes later to try to get more money. When the patient was told she could not get any more money, she became angry and then fell on the ground with the seizure-like activity.   The history is provided by the patient.    Past Medical History  Diagnosis Date  . Hypokalemia   . Narcotic abuse     5 yrs ago lortab, off few yrs  . Anxiety   . Chronic pain   . Endometriosis   . Chronic headache   . Chronic back pain   . Chronic pelvic pain in female   . Heart murmur     as a child  . Pneumonia     54 years  old  . Hx of opioid abuse   . Renal disorder Cyst on Kidney   Past Surgical History  Procedure Laterality Date  . Tonsillectomy    . Cholecystectomy  2010  . Tubal ligation    . Carpal tunnel release      both  . Incision and drainage abscess N/A 07/14/2014    Procedure: INCISION AND DRAINAGE VULVAR ABSCESS;   Surgeon: [name], MD;  Location: [facility];  Service: Gynecology;  Laterality: N/A;  . Abdominal hysterectomy  2007    left ovary remains  . Laparoscopic salpingo oopherectomy Left 08/15/2014    Procedure: LAPAROSCOPIC LEFT SALPINGO OOPHORECTOMY;  Surgeon: [name], MD;  Location: [facility];  Service: Gynecology;  Laterality: Left;   Family History  Problem Relation Age of Onset  . Stroke Father   . Diverticulitis Father   . Heart disease    . Arthritis    . Diabetes    . Kidney disease    . Hypertension Mother   . Diabetes Mother    Social History  Substance Use Topics  . Smoking status: Current Every Day Smoker -- 1.00 packs/day for 18 years    Types: Cigarettes  . Smokeless tobacco: Never Used  . Alcohol Use: No     Comment: rarely   OB History    Gravida Para Term Preterm AB TAB SAB Ectopic Multiple Living   Review of Systems 10 Systems reviewed and are negative for acute change except as noted in the HPI.    Allergies  Fentanyl; Divalproex sodium; Fioricet-codeine; Ibuprofen; and Ketorolac tromethamine  Home Medications   Prior to Admission medications  Medication Sig Start Date End Date Taking? Authorizing Provider  ALPRAZolam Prudy Feeler) 0.5 MG tablet Take 1 tablet by mouth 2 (two) times daily. 11/22/14  Yes Historical Provider, MD  amitriptyline (ELAVIL) 50 MG tablet Take 1/2 tablet for first week and then one pill at bedtime. Patient taking differently: Take 50 mg by mouth at bedtime.  11/10/14  Yes Elige Radon Dettinger, MD  traMADol (ULTRAM) 50 MG tablet Take 1-2 tablets (50-100 mg total) by mouth every 6 (six) hours as needed. pain 12/04/14  Yes [name], MD  aspirin-acetaminophen-caffeine (EXCEDRIN MIGRAINE) [phone] MG per tablet Take 2 tablets by mouth every 6 (six) hours as needed for migraine.    Historical Provider, MD  diphenhydrAMINE (BENADRYL) 25 MG tablet Take 25-50 mg by mouth every 6 (six) hours as needed for allergies.     Historical Provider, MD  traMADol (ULTRAM) 50 MG tablet Take 1-2 tablets (50-100 mg total) by mouth every 6 (six) hours as needed. pain Patient not taking: Reported on 12/07/2014 12/04/14   [name], MD   BP 109/74 mmHg  Pulse 98  Temp(Src) 98.2 F (36.8 C) (Oral)  Resp 24  Ht  (1.549 m)  Wt 110 lb (49.896 kg)  BMI 20.80 kg/m2  SpO2 100% Physical Exam  Constitutional: She is oriented to person, place, and time.  Thin woman crying and tremulous  HENT:  Mouth/Throat: Oropharynx is clear and moist.  Blood in b/l nares; 6cm linear laceration running from forehead through hair line to scalp, no arterial bleeding  Eyes: Conjunctivae and EOM are normal. Pupils are equal, round, and reactive to light.  Neck: Neck supple.  Cardiovascular: Normal rate, regular rhythm and normal heart sounds.   No murmur heard. Pulmonary/Chest: Effort normal and breath sounds normal. No respiratory distress.  Abdominal: Soft. Bowel sounds are normal. She exhibits no distension. There is no tenderness.  Musculoskeletal: She exhibits no edema or tenderness.  Neurological: She is alert and oriented to person, place, and time.  Fluent speech, moving all 4 extremities  Skin: Skin is warm and dry.     Psychiatric:  Tearful, repeatedly asks for opiates or "something" to help with body pain  Nursing note and vitals reviewed.   ED Course  LACERATION REPAIR Date/Time: 12/07/2014 6:02 PM Performed by: Laurence Spates Authorized by: Laurence Spates Consent: Verbal consent obtained. Risks and benefits: risks, benefits and alternatives were discussed Consent given by: patient Patient understanding: patient states understanding of the procedure being performed Patient consent: the patient's understanding of the procedure matches consent given Procedure consent: procedure consent matches procedure scheduled Patient identity confirmed: verbally with patient Body area: head/neck Location  details: forehead Laceration length: 6 cm Foreign bodies: no foreign bodies Tendon involvement: none Nerve involvement: none Vascular damage: no Anesthesia: local infiltration Local anesthetic: lidocaine 2% with epinephrine Anesthetic total: 6 ml Patient sedated: no Preparation: Patient was prepped and draped in the usual sterile fashion. Irrigation solution: saline Irrigation method: jet lavage Amount of cleaning: standard Debridement: none Degree of undermining: none Skin closure: staples Mucous membrane closure: 6-0 fast-absorbing plain gut Number of sutures: 9 Technique: simple Approximation: close Approximation difficulty: simple Dressing: 4x4 sterile gauze Patient tolerance: Patient tolerated the procedure well with no immediate complications Comments: 6cm laceration running vertically from forehead through hair line into scalp; 3 staples placed in scalp and 6 simple interrupted sutures placed on forehead using 6-0 fast absorbing gut suture   (including critical care time) Labs Review Labs Reviewed  COMPREHENSIVE METABOLIC PANEL - Abnormal; Notable for the following:    Potassium 3.2 (*)    Chloride 98 (*)    BUN <5 (*)    AST 64 (*)    ALT 61 (*)    Alkaline Phosphatase 168 (*)    All other components within normal limits  ETHANOL - Abnormal; Notable for the following:    Alcohol, Ethyl (B) 8 (*)    All other components within normal limits  LACTIC ACID, PLASMA - Abnormal; Notable for the following:    Lactic Acid, Venous 3.8 (*)    All other components within normal limits  ACETAMINOPHEN LEVEL - Abnormal; Notable for the following:    Acetaminophen (Tylenol), Serum <10 (*)    All other components within normal limits  URINALYSIS, ROUTINE W REFLEX MICROSCOPIC (NOT AT [facility]) - Abnormal; Notable for the following:    Specific Gravity, Urine <1.005 (*)    Hgb urine dipstick TRACE (*)    All other components within normal limits  URINE RAPID DRUG SCREEN, HOSP  PERFORMED - Abnormal; Notable for the following:    Opiates POSITIVE (*)    Cocaine POSITIVE (*)    Benzodiazepines POSITIVE (*)    All other components within normal limits  URINE MICROSCOPIC-ADD ON - Abnormal; Notable for the following:    Squamous Epithelial / LPF FEW (*)    All other components within normal limits  SALICYLATE LEVEL  LACTIC ACID, PLASMA  RAPID HIV SCREEN (HIV 1/2 AB+AG)  HEPATITIS B SURFACE ANTIGEN  HEPATITIS C ANTIBODY (REFLEX)    Imaging Review Ct Head Wo Contrast  12/07/2014   CLINICAL DATA:  34 year old female with history of seizure earlier today complicated by a fall. Large laceration on the forehead.  EXAM: CT HEAD WITHOUT CONTRAST  CT CERVICAL SPINE WITHOUT CONTRAST  TECHNIQUE: Multidetector CT imaging of the head and cervical spine was performed following the standard protocol without intravenous contrast. Multiplanar CT image reconstructions of the cervical spine were also generated.  COMPARISON:  Head CT 01/15/2013.  Cervical spine CT 02/07/2010.  FINDINGS: CT HEAD FINDINGS  Small amount gas in the left frontal scalp beneath some skin staples. No underlying displaced skull fracture. No acute intracranial abnormalities. Specifically, no evidence of acute intracranial hemorrhage, no definite findings of acute/subacute cerebral ischemia, no mass, mass effect, hydrocephalus or abnormal intra or extra-axial fluid collections. Visualized paranasal sinuses and mastoids are well pneumatized.  CT CERVICAL SPINE FINDINGS  No acute displaced fractures of the cervical spine. Alignment is anatomic. Prevertebral soft tissues are normal. Visualized portions of the upper thorax are unremarkable.  IMPRESSION: 1. Small scalp laceration in the left frontal scalp without underlying displaced skull fracture or findings of significant acute intracranial trauma. 2. No acute abnormality of the cervical spine.   Electronically Signed   By: Trudie Reed M.D.   On: 12/07/2014 18:42   Ct  Cervical Spine Wo Contrast  12/07/2014   CLINICAL DATA:  34 year old female with history of seizure earlier today complicated by a fall. Large laceration on the forehead.  EXAM: CT HEAD WITHOUT CONTRAST  CT CERVICAL SPINE WITHOUT CONTRAST  TECHNIQUE: Multidetector CT imaging of the head and cervical spine was performed following the standard protocol without intravenous contrast. Multiplanar CT image reconstructions of the cervical spine were also generated.  COMPARISON:  Head CT 01/15/2013.  Cervical spine CT 02/07/2010.  FINDINGS: CT HEAD FINDINGS  Small amount gas in the left frontal scalp beneath some skin staples. No underlying displaced skull fracture.  No acute intracranial abnormalities. Specifically, no evidence of acute intracranial hemorrhage, no definite findings of acute/subacute cerebral ischemia, no mass, mass effect, hydrocephalus or abnormal intra or extra-axial fluid collections. Visualized paranasal sinuses and mastoids are well pneumatized.  CT CERVICAL SPINE FINDINGS  No acute displaced fractures of the cervical spine. Alignment is anatomic. Prevertebral soft tissues are normal. Visualized portions of the upper thorax are unremarkable.  IMPRESSION: 1. Small scalp laceration in the left frontal scalp without underlying displaced skull fracture or findings of significant acute intracranial trauma. 2. No acute abnormality of the cervical spine.   Electronically Signed   By: Trudie Reed M.D.   On: 12/07/2014 18:42   I have personally reviewed and evaluated these lab results as part of my medical decision-making.   EKG Interpretation None     Medications  lidocaine-EPINEPHrine (XYLOCAINE W/EPI) 2 %-1:200000 (PF) injection 20 mL (20 mLs Infiltration Given by Other 12/07/14 1725)  Tdap (BOOSTRIX) injection 0.5 mL (0.5 mLs Intramuscular Given 12/07/14 1723)  LORazepam (ATIVAN) tablet 2 mg (2 mg Oral Given 12/07/14 1723)  sodium chloride 0.9 % bolus 1,000 mL (0 mLs Intravenous Stopped  12/07/14 2037)  ondansetron (ZOFRAN) injection 4 mg (4 mg Intravenous Given 12/07/14 1805)  acetaminophen (TYLENOL) tablet 650 mg (650 mg Oral Given 12/07/14 1907)  oxymetazoline (AFRIN) 0.05 % nasal spray 1 spray (1 spray Each Nare Given 12/07/14 1944)    MDM   Final diagnoses:  Laceration of head, initial encounter  Polysubstance abuse   34 year old female with polysubstance abuse who presents with seizure-like activity and fall which resulted in for head laceration. She was brought in by EMS and was tearful and tremulous at presentation. Vital signs stable. She had a large linear laceration on her forehead that was hemostatic. No neurologic deficits but patient repeatedly asked for pain medication her "nerves" medication to help with her symptoms of body pain and anxiety. Obtained CT of head and C-spine and sent above labwork.  Labs notable for initial lactate of 3 which normalized after 1 L of IV fluids. UDS positive for cocaine, opiates, and benzos. LFTs mildly elevated and ethanol level 8. During the patient's ED stay, the bystander who initially took care of her checked into the emergency department for blood exposure. I requested permission from the patient to check screening labs including HIV and hepatitis panel. The patient agreed to testing. Rapid HIV negative. 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 She has admitted to using cocaine earlier today and her polysubstance abuse may have contributed to episode. She has no tachycardia, vomiting, or severe tremors to suggest acute withdrawal. Gave her 1 dose of Ativan and later Tylenol for her symptoms. Updated tetanus vaccination. Laceration repair with absorbable sutures and staples; see procedure note for details. Patient had developed epistaxis after fall and had a small recurrence of epistaxis while in  the ED. Gave her Afrin after which her nose stopped bleeding. No obvious deformity of nose to suggest significant nasal bone fracture and her nosebleed may be related to cocaine use.  Because of the patient's frequent requests for opiate detox, contacted TTS for evaluation. Patient denied any suicidal or homicidal ideation and they felt that she was safe for d/c w/ follow-up at pain clinic and PCP. I relayed this information to the patient and I reviewed return precautions including signs of wound infection or development of neurologic symptoms.  The patient and her husband voiced understanding. Pt discharged in satisfactory condition.   Laurence Spates, MD 12/08/14 [phone]

## 2014-12-07 NOTE — ED Notes (Signed)
Pt requesting pain medication at this time. EDP notified. 

## 2014-12-08 ENCOUNTER — Ambulatory Visit: Payer: BLUE CROSS/BLUE SHIELD | Admitting: Family Medicine

## 2014-12-08 LAB — HEPATITIS C ANTIBODY (REFLEX): HCV Ab: <0.1 s/co ratio (ref 0.0–0.9)

## 2014-12-08 LAB — HEPATITIS B SURFACE ANTIGEN: Hepatitis B Surface Ag: NEGATIVE

## 2014-12-08 LAB — HCV COMMENT:

## 2014-12-11 ENCOUNTER — Encounter: Payer: Self-pay | Admitting: Family Medicine

## 2014-12-15 ENCOUNTER — Ambulatory Visit (INDEPENDENT_AMBULATORY_CARE_PROVIDER_SITE_OTHER): Payer: BLUE CROSS/BLUE SHIELD | Admitting: Family Medicine

## 2014-12-15 ENCOUNTER — Encounter: Payer: Self-pay | Admitting: Family Medicine

## 2014-12-15 VITALS — BP 112/77 | HR 90 | Temp 98.5°F | Ht 61.0 in | Wt 108.0 lb

## 2014-12-15 DIAGNOSIS — S0181XA Laceration without foreign body of other part of head, initial encounter: Secondary | ICD-10-CM | POA: Diagnosis not present

## 2014-12-15 NOTE — Progress Notes (Signed)
Subjective:    Patient ID: [name], female    DOB: 1980/06/07, 34 y.o.   MRN: [id number]  HPI Pt here for follow up from ER. She was seen at [facility] for a laceration to her head after falling from a seizure. This was a vertical laceration and absorbable sutures were placed into the 4 head with staples into the scalp.        Patient Active Problem List   Diagnosis Date Noted  . Depression with anxiety 11/10/2014  . Insomnia 11/10/2014  . Chronic pelvic pain in female 08/15/2014  . Vulvar abscess left labia majora 07/14/2014  . Other and unspecified ovarian cyst 09/16/2013  . Abdominal pain, left lower quadrant 09/16/2013  . LBP (low back pain) 12/16/2012  . Pelvic and perineal pain 10/25/2012  . Shoulder pain 03/17/2012  . Colitis 01/16/2012  . Abdominal pain 01/16/2012  . Cocaine abuse 09/28/2011  . Dental decay 09/28/2011  . Hormonal disorder 09/28/2011  . Hypokalemia   . Narcotic abuse    Outpatient Encounter Prescriptions as of 12/15/2014  Medication Sig  . ALPRAZolam (XANAX) 0.5 MG tablet Take 1 tablet by mouth 2 (two) times daily.  Marland Kitchen amitriptyline (ELAVIL) 50 MG tablet Take 1/2 tablet for first week and then one pill at bedtime. (Patient taking differently: Take 50 mg by mouth at bedtime. )  . aspirin-acetaminophen-caffeine (EXCEDRIN MIGRAINE) 250-250-65 MG per tablet Take 2 tablets by mouth every 6 (six) hours as needed for migraine.  . diphenhydrAMINE (BENADRYL) 25 MG tablet Take 25-50 mg by mouth every 6 (six) hours as needed for allergies.  . [DISCONTINUED] traMADol (ULTRAM) 50 MG tablet Take 1-2 tablets (50-100 mg total) by mouth every 6 (six) hours as needed. pain  . traMADol (ULTRAM) 50 MG tablet Take 1-2 tablets (50-100 mg total) by mouth every 6 (six) hours as needed. pain (Patient not taking: Reported on 12/15/2014)   No facility-administered encounter medications on file as of 12/15/2014.     Review of Systems  Constitutional: Negative.   HENT:  Negative.   Eyes: Negative.   Respiratory: Negative.   Cardiovascular: Negative.   Gastrointestinal: Negative.   Endocrine: Negative.   Genitourinary: Negative.   Musculoskeletal: Negative.   Skin: Negative.   Allergic/Immunologic: Negative.   Neurological: Negative.   Hematological: Negative.   Psychiatric/Behavioral: Negative.        Objective:   Physical Exam  Constitutional: She is oriented to person, place, and time. She appears well-developed and well-nourished. No distress.  HENT:  Head: Normocephalic.  The patient has about a 3 inch vertical laceration from her for head into her scalp. Absorbable sutures were placed into the 4 head portion and 3 staples are placed into the scalp portion with the hair. These staples were successfully removed only with slight bleeding. The patient tolerated the procedure well.  Eyes: EOM are normal. Pupils are equal, round, and reactive to light. Right eye exhibits no discharge. Left eye exhibits no discharge.  Neck: Normal range of motion.  Musculoskeletal: Normal range of motion.  Neurological: She is alert and oriented to person, place, and time.  Skin: Skin is warm and dry. No rash noted. No erythema. No pallor.  There is no sign of any infection and the wound appeared healing well on her scalp and for head.  Psychiatric: She has a normal mood and affect. Her behavior is normal. Judgment and thought content normal.  Nursing note and vitals reviewed.   The patient did request some more  pain medication and I refused to give her this and she then told me she had an appointment with the pain clinic which is where she needs to go if she needs any additional pain medication.      Assessment & Plan:  1. Forehead laceration, initial encounter -Staples successfully removed without complication and the patient should return to clinic only if needed  Patient Instructions  Follow-up with pain clinic as planned   Nyra Capes MD

## 2014-12-15 NOTE — Patient Instructions (Signed)
Follow-up with pain clinic as planned

## 2014-12-16 ENCOUNTER — Encounter (HOSPITAL_COMMUNITY): Payer: Self-pay | Admitting: *Deleted

## 2014-12-16 ENCOUNTER — Emergency Department (HOSPITAL_COMMUNITY)
Admission: EM | Admit: 2014-12-16 | Discharge: 2014-12-16 | Disposition: A | Discharge status: Home / Self Care | Payer: BLUE CROSS/BLUE SHIELD | Attending: Emergency Medicine | Admitting: Emergency Medicine

## 2014-12-16 DIAGNOSIS — R011 Cardiac murmur, unspecified: Secondary | ICD-10-CM | POA: Insufficient documentation

## 2014-12-16 DIAGNOSIS — Z79899 Other long term (current) drug therapy: Secondary | ICD-10-CM | POA: Insufficient documentation

## 2014-12-16 DIAGNOSIS — Z720 Tobacco use: Secondary | ICD-10-CM | POA: Insufficient documentation

## 2014-12-16 DIAGNOSIS — R197 Diarrhea, unspecified: Secondary | ICD-10-CM | POA: Insufficient documentation

## 2014-12-16 DIAGNOSIS — R34 Anuria and oliguria: Secondary | ICD-10-CM | POA: Insufficient documentation

## 2014-12-16 DIAGNOSIS — F1110 Opioid abuse, uncomplicated: Secondary | ICD-10-CM | POA: Diagnosis not present

## 2014-12-16 DIAGNOSIS — E860 Dehydration: Secondary | ICD-10-CM | POA: Diagnosis not present

## 2014-12-16 DIAGNOSIS — E876 Hypokalemia: Secondary | ICD-10-CM | POA: Diagnosis not present

## 2014-12-16 DIAGNOSIS — Z4802 Encounter for removal of sutures: Secondary | ICD-10-CM | POA: Diagnosis not present

## 2014-12-16 DIAGNOSIS — F419 Anxiety disorder, unspecified: Secondary | ICD-10-CM | POA: Diagnosis not present

## 2014-12-16 DIAGNOSIS — Z8701 Personal history of pneumonia (recurrent): Secondary | ICD-10-CM | POA: Insufficient documentation

## 2014-12-16 DIAGNOSIS — R112 Nausea with vomiting, unspecified: Secondary | ICD-10-CM | POA: Diagnosis not present

## 2014-12-16 DIAGNOSIS — F1410 Cocaine abuse, uncomplicated: Secondary | ICD-10-CM | POA: Diagnosis not present

## 2014-12-16 DIAGNOSIS — G8929 Other chronic pain: Secondary | ICD-10-CM | POA: Diagnosis not present

## 2014-12-16 DIAGNOSIS — R1084 Generalized abdominal pain: Secondary | ICD-10-CM | POA: Insufficient documentation

## 2014-12-16 DIAGNOSIS — Z8742 Personal history of other diseases of the female genital tract: Secondary | ICD-10-CM | POA: Diagnosis not present

## 2014-12-16 LAB — CBC WITH DIFFERENTIAL/PLATELET
BASOS ABS: 0 10*3/uL (ref 0.0–0.1)
BASOS PCT: 0 %
EOS PCT: 2 %
Eosinophils Absolute: 0.2 10*3/uL (ref 0.0–0.7)
HCT: 37.9 % (ref 36.0–46.0)
Hemoglobin: 13.5 g/dL (ref 12.0–15.0)
LYMPHS PCT: 36 %
Lymphs Abs: 3.9 10*3/uL (ref 0.7–4.0)
MCH: 35.2 pg — ABNORMAL HIGH (ref 26.0–34.0)
MCHC: 35.6 g/dL (ref 30.0–36.0)
MCV: 98.7 fL (ref 78.0–100.0)
Monocytes Absolute: 1 10*3/uL (ref 0.1–1.0)
Monocytes Relative: 9 %
NEUTROS ABS: 5.8 10*3/uL (ref 1.7–7.7)
Neutrophils Relative %: 53 %
PLATELETS: 331 10*3/uL (ref 150–400)
RBC: 3.84 MIL/uL — AB (ref 3.87–5.11)
RDW: 11.9 % (ref 11.5–15.5)
WBC: 10.9 10*3/uL — AB (ref 4.0–10.5)

## 2014-12-16 LAB — RAPID URINE DRUG SCREEN, HOSP PERFORMED
Amphetamines: NOT DETECTED
BARBITURATES: NOT DETECTED
BENZODIAZEPINES: POSITIVE — AB
COCAINE: POSITIVE — AB
OPIATES: POSITIVE — AB
Tetrahydrocannabinol: NOT DETECTED

## 2014-12-16 LAB — COMPREHENSIVE METABOLIC PANEL
ALT: 28 U/L (ref 14–54)
ANION GAP: 14 (ref 5–15)
AST: 26 U/L (ref 15–41)
Albumin: 4.4 g/dL (ref 3.5–5.0)
Alkaline Phosphatase: 108 U/L (ref 38–126)
BILIRUBIN TOTAL: 0.7 mg/dL (ref 0.3–1.2)
BUN: 5 mg/dL — AB (ref 6–20)
CO2: 26 mmol/L (ref 22–32)
Calcium: 8.7 mg/dL — ABNORMAL LOW (ref 8.9–10.3)
Chloride: 93 mmol/L — ABNORMAL LOW (ref 101–111)
Creatinine, Ser: 0.76 mg/dL (ref 0.44–1.00)
GFR calc Af Amer: >60 mL/min (ref >60)
Glucose, Bld: 97 mg/dL (ref 65–99)
POTASSIUM: 2.7 mmol/L — AB (ref 3.5–5.1)
Sodium: 133 mmol/L — ABNORMAL LOW (ref 135–145)
TOTAL PROTEIN: 7.9 g/dL (ref 6.5–8.1)

## 2014-12-16 LAB — LIPASE, BLOOD: LIPASE: 15 U/L — AB (ref 22–51)

## 2014-12-16 MED ORDER — METOCLOPRAMIDE HCL 5 MG/ML IJ SOLN
10.0000 mg | Freq: Once | INTRAMUSCULAR | Status: AC
Start: 2014-12-16 — End: 2014-12-16
  Administered 2014-12-16: 10 mg via INTRAVENOUS
  Filled 2014-12-16: qty 2

## 2014-12-16 MED ORDER — ONDANSETRON HCL 4 MG PO TABS: 4.0000 mg | ORAL_TABLET | Freq: Three times a day (TID) | ORAL | Status: DC | PRN

## 2014-12-16 MED ORDER — POTASSIUM CHLORIDE CRYS ER 20 MEQ PO TBCR
40.0000 meq | EXTENDED_RELEASE_TABLET | Freq: Once | ORAL | Status: AC
  Administered 2014-12-16: 40 meq via ORAL
  Filled 2014-12-16: qty 2

## 2014-12-16 MED ORDER — POTASSIUM CHLORIDE ER 10 MEQ PO TBCR: 20.0000 meq | EXTENDED_RELEASE_TABLET | Freq: Two times a day (BID) | ORAL | Status: DC

## 2014-12-16 MED ORDER — SODIUM CHLORIDE 0.9 % IV SOLN: 1000.0000 mL | Freq: Once | INTRAVENOUS | Status: DC

## 2014-12-16 MED ORDER — DIPHENHYDRAMINE HCL 50 MG/ML IJ SOLN
25.0000 mg | Freq: Once | INTRAMUSCULAR | Status: AC
  Administered 2014-12-16: 25 mg via INTRAVENOUS
  Filled 2014-12-16: qty 1

## 2014-12-16 MED ORDER — SODIUM CHLORIDE 0.9 % IV SOLN
1000.0000 mL | Freq: Once | INTRAVENOUS | Status: AC
  Administered 2014-12-16: 1000 mL via INTRAVENOUS

## 2014-12-16 MED ORDER — SODIUM CHLORIDE 0.9 % IV SOLN: 1000.0000 mL | INTRAVENOUS | Status: DC

## 2014-12-16 NOTE — ED Notes (Addendum)
Pt states that she had a few hydrocodone or percocet's that were left over from previous prescriptions,  last dose was 3 pm yesterday,

## 2014-12-16 NOTE — ED Notes (Signed)
Pt c/o abd cramping, n/v/d that started 4 days ago,

## 2014-12-16 NOTE — ED Notes (Signed)
CRITICAL VALUE ALERT  Critical value received:  Potassium 2.7  Date of notification:  12/16/2014  Time of notification:  01:45  Critical value read back: yes  Nurse who received alert:  Alena Bills   MD notified (1st page):  Dr Lynelle Doctor  Time of first page: 01:46  MD notified (2nd page):  Time of second page:  Responding MD:  Dr Lynelle Doctor  Time MD responded:  01:46

## 2014-12-16 NOTE — ED Notes (Signed)
Dr Knapp at bedside,  

## 2014-12-16 NOTE — ED Provider Notes (Signed)
CSN: [id number]     Arrival date & time 12/16/14  0008 History   First MD Initiated Contact with Patient 12/16/14 0044     Chief Complaint  Patient presents with  . Abdominal Pain     (Consider location/radiation/quality/duration/timing/severity/associated sxs/prior Treatment) HPI  Patient reports she has had vomiting and diarrhea for the past 4 days. She states she's had vomiting 3 times today and has had diarrhea about 6-7 times. She states she tried a Zofran earlier today around noon time which helped a little bit. She denies any fever. She states she feels cold and then hot. She has diffuse abdominal pain that she describes as aching and cramping with an occasional sharp jabbing pain. She denies feeling dizziness or lightheadedness but she does feel weak however she has been weak since she had a seizure last week and sustained a laceration to her forehead. She complains of dry mouth and has decreased urinary output. She states her 2 children had vomiting and diarrhea last week however it only lasted about 2 days. She was seen at her PCP office today to get the staples removed from her forehead laceration and they told her she had the bug that was going around. Patient was seen in the ED last night and was requesting opiate detox. When questioned about that she states she last took a Percocet today. She states she has hydrocodone prescribed from her dentist. She also states she has seen Dr. Gerilyn Pilgrim and is on tramadol for that. She states she is getting a appointment with another pain management doctor after she has her teeth fixed by her dentist. Patient has come to the ED in the past for chronic abdominal pain she states however this is different because she normally doesn't have as much diarrhea. Those visits for abdominal pain were in August.  [facility] in [place]   Past Medical History  Diagnosis Date  . Hypokalemia   . Narcotic abuse     5 yrs ago lortab, off few yrs  .  Anxiety   . Chronic pain   . Endometriosis   . Chronic headache   . Chronic back pain   . Chronic pelvic pain in female   . Heart murmur     as a child  . Pneumonia     34 years  old  . Hx of opioid abuse   . Renal disorder Cyst on Kidney   Past Surgical History  Procedure Laterality Date  . Tonsillectomy    . Cholecystectomy  2010  . Tubal ligation    . Carpal tunnel release      both  . Incision and drainage abscess N/A 07/14/2014    Procedure: INCISION AND DRAINAGE VULVAR ABSCESS;  Surgeon: [name], MD;  Location: [facility];  Service: Gynecology;  Laterality: N/A;  . Abdominal hysterectomy  2007    left ovary remains  . Laparoscopic salpingo oopherectomy Left 08/15/2014    Procedure: LAPAROSCOPIC LEFT SALPINGO OOPHORECTOMY;  Surgeon: [name], MD;  Location: [facility];  Service: Gynecology;  Laterality: Left;   Family History  Problem Relation Age of Onset  . Stroke Father   . Diverticulitis Father   . Heart disease    . Arthritis    . Diabetes    . Kidney disease    . Hypertension Mother   . Diabetes Mother    Social History  Substance Use Topics  . Smoking status: Current Every Day Smoker -- 1.00 packs/day for 18  years    Types: Cigarettes  . Smokeless tobacco: Never Used  . Alcohol Use: No     Comment: rarely   Stay at home mother   OB History    Gravida Para Term Preterm AB TAB SAB Ectopic Multiple Living   4 4 4       4      Review of Systems  All other systems reviewed and are negative.     Allergies  Fentanyl; Divalproex sodium; Fioricet-codeine; Ibuprofen; and Ketorolac tromethamine  Home Medications   Prior to Admission medications   Medication Sig Start Date End Date Taking? Authorizing Provider  ALPRAZolam Prudy Feeler) 0.5 MG tablet Take 1 tablet by mouth 2 (two) times daily. 11/22/14   Historical Provider, MD  amitriptyline (ELAVIL) 50 MG tablet Take 1/2 tablet for first week and then one pill at bedtime. Patient taking differently:  Take 50 mg by mouth at bedtime.  11/10/14   Elige Radon Dettinger, MD  aspirin-acetaminophen-caffeine (EXCEDRIN MIGRAINE) [phone] MG per tablet Take 2 tablets by mouth every 6 (six) hours as needed for migraine.    Historical Provider, MD  diphenhydrAMINE (BENADRYL) 25 MG tablet Take 25-50 mg by mouth every 6 (six) hours as needed for allergies.    Historical Provider, MD  ondansetron (ZOFRAN) 4 MG tablet Take 1 tablet (4 mg total) by mouth every 8 (eight) hours as needed for nausea or vomiting. 12/16/14   Devoria Albe, MD  potassium chloride (K-DUR) 10 MEQ tablet Take 2 tablets (20 mEq total) by mouth 2 (two) times daily. 12/16/14   Devoria Albe, MD  traMADol (ULTRAM) 50 MG tablet Take 1-2 tablets (50-100 mg total) by mouth every 6 (six) hours as needed. pain Patient not taking: Reported on 12/15/2014 12/04/14   [name], MD   BP 137/93 mmHg  Pulse 89  Temp(Src) 98.3 F (36.8 C) (Oral)  Resp 15  Ht 5\' 1"  (1.549 m)  Wt 108 lb (48.988 kg)  BMI 20.42 kg/m2  SpO2 99%  Vital signs normal   Physical Exam  Constitutional: She is oriented to person, place, and time.  Non-toxic appearance. She does not appear ill. No distress.  Thin female  HENT:  Head: Normocephalic and atraumatic.  Right Ear: External ear normal.  Left Ear: External ear normal.  Nose: Nose normal. No mucosal edema or rhinorrhea.  Mouth/Throat: Oropharynx is clear and moist and mucous membranes are normal. No dental abscesses or uvula swelling.  Dry tongue and lips  Eyes: Conjunctivae and EOM are normal. Pupils are equal, round, and reactive to light.  Neck: Normal range of motion and full passive range of motion without pain. Neck supple.  Cardiovascular: Normal rate, regular rhythm and normal heart sounds.  Exam reveals no gallop and no friction rub.   No murmur heard. Pulmonary/Chest: Effort normal and breath sounds normal. No respiratory distress. She has no wheezes. She has no rhonchi. She has no rales. She exhibits no  tenderness and no crepitus.  Abdominal: Soft. Normal appearance and bowel sounds are normal. She exhibits no distension. There is generalized tenderness. There is no rebound and no guarding.  Musculoskeletal: Normal range of motion. She exhibits no edema or tenderness.  Moves all extremities well.   Neurological: She is alert and oriented to person, place, and time. She has normal strength. No cranial nerve deficit.  Skin: Skin is warm, dry and intact. No rash noted. No erythema. No pallor.  Psychiatric: She has a normal mood and affect. Her speech is  normal and behavior is normal. Her mood appears not anxious.  Nursing note and vitals reviewed.   ED Course  Procedures (including critical care time)  Medications  0.9 %  sodium chloride infusion (1,000 mLs Intravenous New Bag/Given 12/16/14 0121)    Followed by  0.9 %  sodium chloride infusion (not administered)    Followed by  0.9 %  sodium chloride infusion (not administered)  metoCLOPramide (REGLAN) injection 10 mg (10 mg Intravenous Given 12/16/14 0121)  diphenhydrAMINE (BENADRYL) injection 25 mg (25 mg Intravenous Given 12/16/14 0121)  potassium chloride SA (K-DUR,KLOR-CON) CR tablet 40 mEq (40 mEq Oral Given 12/16/14 0227)   Patient was given IV fluids for dehydration and given IV nausea medication. Patient was just in the ED recently requesting help with opiate detox. I explained to patient she would not be getting any opiate pain medications tonight in the ED.  After review her laboratory results patient was started on oral potassium for her hypokalemia.  2:15 AM patient states she feels well and left to be discharged.  Review of the [place] shows patient has gotten 26 narcotic prescriptions filled since April 20. These vary between hydrocodone 5/325, hydrocodone 10/325, oxycodone 5/325, hydromorphone 2 mg tablets, tramadol 50 mg tablets.She is only gotten four prescriptions for alprazolam during that same time. These  have been prescribed by her GYN, her pain management doctor, a dentist, and a urgent care in [facility] she most recently received tramadol 120 tablets twice in September from her pain management doctor.   Labs Review Results for orders placed or performed during the hospital encounter of 12/16/14  Comprehensive metabolic panel  Result Value Ref Range   Sodium 133 (L) 135 - 145 mmol/L   Potassium 2.7 (LL) 3.5 - 5.1 mmol/L   Chloride 93 (L) 101 - 111 mmol/L   CO2 26 22 - 32 mmol/L   Glucose, Bld 97 65 - 99 mg/dL   BUN 5 (L) 6 - 20 mg/dL   Creatinine, Ser 1.61 0.44 - 1.00 mg/dL   Calcium 8.7 (L) 8.9 - 10.3 mg/dL   Total Protein 7.9 6.5 - 8.1 g/dL   Albumin 4.4 3.5 - 5.0 g/dL   AST 26 15 - 41 U/L   ALT 28 14 - 54 U/L   Alkaline Phosphatase 108 38 - 126 U/L   Total Bilirubin 0.7 0.3 - 1.2 mg/dL   GFR calc non Af Amer >60 >60 mL/min   GFR calc Af Amer >60 >60 mL/min   Anion gap 14 5 - 15  CBC with Differential  Result Value Ref Range   WBC 10.9 (H) 4.0 - 10.5 K/uL   RBC 3.84 (L) 3.87 - 5.11 MIL/uL   Hemoglobin 13.5 12.0 - 15.0 g/dL   HCT 09.6 04.5 - 40.9 %   MCV 98.7 78.0 - 100.0 fL   MCH 35.2 (H) 26.0 - 34.0 pg   MCHC 35.6 30.0 - 36.0 g/dL   RDW 81.1 91.4 - 78.2 %   Platelets 331 150 - 400 K/uL   Neutrophils Relative % 53 %   Neutro Abs 5.8 1.7 - 7.7 K/uL   Lymphocytes Relative 36 %   Lymphs Abs 3.9 0.7 - 4.0 K/uL   Monocytes Relative 9 %   Monocytes Absolute 1.0 0.1 - 1.0 K/uL   Eosinophils Relative 2 %   Eosinophils Absolute 0.2 0.0 - 0.7 K/uL   Basophils Relative 0 %   Basophils Absolute 0.0 0.0 - 0.1 K/uL  Lipase, blood  Result Value Ref Range   Lipase 15 (L) 22 - 51 U/L  Urine rapid drug screen (hosp performed)  Result Value Ref Range   Opiates POSITIVE (A) NONE DETECTED   Cocaine POSITIVE (A) NONE DETECTED   Benzodiazepines POSITIVE (A) NONE DETECTED   Amphetamines NONE DETECTED NONE DETECTED   Tetrahydrocannabinol NONE DETECTED NONE DETECTED    Barbiturates NONE DETECTED NONE DETECTED    Laboratory interpretation all normal except hypokalemia. + UDS, mild leukocytosis    Imaging Review No results found.   Ct Head Wo Contrast  12/07/2014   CLINICAL DATA:  34 year old female with history of seizure earlier today complicated by a fall. Large laceration on the forehead. IMPRESSION: 1. Small scalp laceration in the left frontal scalp without underlying displaced skull fracture or findings of significant acute intracranial trauma. 2. No acute abnormality of the cervical spine.   Electronically Signed   By: Trudie Reed M.D.   On: 12/07/2014 18:42   Ct Cervical Spine Wo Contrast  12/07/2014   CLINICAL DATA:  34 year old female with history of seizure earlier today complicated by a fall. Large laceration on the forehead.  IMPRESSION: 1. Small scalp laceration in the left frontal scalp without underlying displaced skull fracture or findings of significant acute intracranial trauma. 2. No acute abnormality of the cervical spine.   Electronically Signed   By: Trudie Reed M.D.   On: 12/07/2014 18:42   I have personally reviewed and evaluated these images and lab results as part of my medical decision-making.   EKG Interpretation None      MDM   Final diagnoses:  Nausea vomiting and diarrhea  Dehydration  Hypokalemia  Cocaine abuse  Narcotic abuse    New Prescriptions   ONDANSETRON (ZOFRAN) 4 MG TABLET    Take 1 tablet (4 mg total) by mouth every 8 (eight) hours as needed for nausea or vomiting.   POTASSIUM CHLORIDE (K-DUR) 10 MEQ TABLET    Take 2 tablets (20 mEq total) by mouth 2 (two) times daily.    Plan discharge  Devoria Albe, MD, Concha Pyo, MD 12/16/14 [phone]

## 2014-12-16 NOTE — ED Notes (Signed)
Pt states that she is feeling better and is ready to go, Dr Lynelle Doctor notified,

## 2014-12-16 NOTE — Discharge Instructions (Signed)
Drink plenty of fluids. Avoid milk until your diarrhea is gone. Use Zofran for nausea or vomiting. You can take Imodium over-the-counter for diarrhea. Take the potassium pills until gone. Consider getting help with your use of pain medications and cocaine.   Emergency Department Resource Guide   Behavioral Health Resources in the Community: Intensive Outpatient Programs Organization         Address  Phone  Notes  [facility] [address], [place], [place] [phone]   [facility] Outpatient [address], [place], [place] [phone]   [facility] [address] [place], [place]  [phone]   [facility] [address],  [place], [place] [phone] or [phone]   Substance Abuse Resources Organization         Address  Phone  Notes  [facility]  [phone]   [facility]  [phone]   [facility]  [phone]   Floydene Flock  [phone]   Residential & Outpatient Substance Abuse Program  [phone]   Psychological Services Organization         Address  Phone  Notes  [facility]  [phone]   [facility] Services  [phone]   [facility] [address] [place] [phone] or [phone]    Mobile Crisis Teams Organization         Address  Phone  Notes  [facility], Mobile Crisis Care Unit  [phone]   [facility]  [address] [place], [place] [phone]   Doristine Locks [address] [place] [phone]    Self-Help/Support Groups Organization         Address  Phone             Notes  [facility] - variety of support groups  [phone]- I7437963 Call for more information  Narcotics Anonymous (NA), [facility] [address], [employer] [place]  2 meetings at this location   Statistician          Address  Phone  Notes  [facility] [address],    [place]  [phone]   [facility]  [address], [place] [address], [place], [place] [phone]   [facility] Treatment Facility [address] [place], [place] [phone] Admissions: 8am-3pm M-F  [facility] [address],    [place], [place] [phone]   [facility] [address] [place], [place], [place] [phone]   [facility] [address]  [place], [place] [phone]   [facility] - Intensive Outpatient [address], [place], [place] [phone]   [facility] ([facility] [address] [place]  [place], [place] [phone] or [phone]   [facility] ([facility]) [address] [place], [place] [phone] Accepts Medicaid  [facility] [address],  [place] [phone] Substance Abuse/Addiction Treatment   [facility] Organization         Address  Phone  Notes  [facility]  [phone]   Angie Fava, PhD [address] [place], [place]   [phone] or [phone]   [facility] Behavioral   [address] [place], [place] [phone]   [facility] [address] [place], [place] [phone] Insurance/Medicaid/sponsorship through [employer] and Families [address]  [place], [place] [phone] Therapy/tele-psych/case  [facility] [address]   [place], [place] [phone]    Dr. Lolly Mustache  [phone]   [facility]  [employer] [facility] Dept. 1) [address], [place] 2) [address], [place] 3)  [address], [place] [phone]  [phone]   [facility] Child Abuse Hotline (571)883-4464 or [phone] (After Hours)      Diarrhea Diarrhea is watery poop  (stool). It can make you feel weak, tired, thirsty, or give you a dry mouth (signs of dehydration). Watery poop is a sign of another problem, most often an infection. It often lasts 2-3 days. It can last longer if it is a sign of something serious. Take care of yourself as told by your doctor. HOME CARE   Drink 1 cup (8 ounces) of fluid each time you have watery poop.  Do not drink the following fluids:  Those that contain simple sugars (fructose, glucose, galactose, lactose, sucrose, maltose).  Sports drinks.  Fruit juices.  Whole milk products.  Sodas.  Drinks with caffeine (coffee, tea, soda) or alcohol.  Oral rehydration solution may be used if the doctor says it is okay. You may make your own solution. Follow this recipe:   - teaspoon table salt.   teaspoon baking soda.   teaspoon salt substitute containing potassium chloride.  1 tablespoons sugar.  1 liter (34 ounces) of water.  Avoid the following foods:  High fiber foods, such as raw fruits and vegetables.  Nuts, seeds, and whole grain breads and cereals.   Those that are sweetened with sugar alcohols (xylitol, sorbitol, mannitol).  Try eating the following foods:  Starchy foods, such as rice, toast, pasta, low-sugar cereal, oatmeal, baked potatoes, crackers, and bagels.  Bananas.  Applesauce.  Eat probiotic-rich foods, such as yogurt and milk products that are fermented.  Wash your hands well after each time you have watery poop.  Only take medicine as told by your doctor.  Take a warm bath to help lessen burning or pain from having watery poop. GET HELP RIGHT AWAY IF:   You cannot drink fluids without throwing up (vomiting).  You keep throwing up.  You have blood in your poop, or your poop looks black and tarry.  You do not pee (urinate) in 6-8 hours, or there is only a small amount of very dark pee.  You have belly (abdominal) pain that gets worse or stays in the same spot  (localizes).  You are weak, dizzy, confused, or light-headed.  You have a very bad headache.  Your watery poop gets worse or does not get better.  You have a fever or lasting symptoms for more than 2-3 days.  You have a fever and your symptoms suddenly get worse. MAKE SURE YOU:   Understand these instructions.  Will watch your condition.  Will get help right away if you are not doing well or get worse.   This information is not intended to replace advice given to you by your health care provider. Make sure you discuss any questions you have with your health care provider.   Document Released: 08/13/2007 Document Revised: 03/17/2014 Document Reviewed: 11/02/2011 Elsevier Interactive Patient Education 2016 Elsevier Inc.  Nausea and Vomiting Nausea means you feel sick to your stomach. Throwing up (vomiting) is a reflex where stomach contents come out of your mouth. HOME CARE   Take medicine as told by  your doctor.  Do not force yourself to eat. However, you do need to drink fluids.  If you feel like eating, eat a normal diet as told by your doctor.  Eat rice, wheat, potatoes, bread, lean meats, yogurt, fruits, and vegetables.  Avoid high-fat foods.  Drink enough fluids to keep your pee (urine) clear or pale yellow.  Ask your doctor how to replace body fluid losses (rehydrate). Signs of body fluid loss (dehydration) include:  Feeling very thirsty.  Dry lips and mouth.  Feeling dizzy.  Dark pee.  Peeing less than normal.  Feeling confused.  Fast breathing or heart rate. GET HELP RIGHT AWAY IF:   You have blood in your throw up.  You have black or bloody poop (stool).  You have a bad headache or stiff neck.  You feel confused.  You have bad belly (abdominal) pain.  You have chest pain or trouble breathing.  You do not pee at least once every 8 hours.  You have cold, clammy skin.  You keep throwing up after 24 to 48 hours.  You have a fever. MAKE  SURE YOU:   Understand these instructions.  Will watch your condition.  Will get help right away if you are not doing well or get worse.   This information is not intended to replace advice given to you by your health care provider. Make sure you discuss any questions you have with your health care provider.   Document Released: 08/13/2007 Document Revised: 05/19/2011 Document Reviewed: 07/26/2010 Elsevier Interactive Patient Education 2016 [employer].  Hypokalemia Hypokalemia means that the amount of potassium in the blood is lower than normal.Potassium is a chemical, called an electrolyte, that helps regulate the amount of fluid in the body. It also stimulates muscle contraction and helps nerves function properly.Most of the body's potassium is inside of cells, and only a very small amount is in the blood. Because the amount in the blood is so small, minor changes can be life-threatening. CAUSES  Antibiotics.  Diarrhea or vomiting.  Using laxatives too much, which can cause diarrhea.  Chronic kidney disease.  Water pills (diuretics).  Eating disorders (bulimia).  Low magnesium level.  Sweating a lot. SIGNS AND SYMPTOMS  Weakness.  Constipation.  Fatigue.  Muscle cramps.  Mental confusion.  Skipped heartbeats or irregular heartbeat (palpitations).  Tingling or numbness. DIAGNOSIS  Your health care provider can diagnose hypokalemia with blood tests. In addition to checking your potassium level, your health care provider may also check other lab tests. TREATMENT Hypokalemia can be treated with potassium supplements taken by mouth or adjustments in your current medicines. If your potassium level is very low, you may need to get potassium through a vein (IV) and be monitored in the hospital. A diet high in potassium is also helpful. Foods high in potassium are:  Nuts, such as peanuts and pistachios.  Seeds, such as sunflower seeds and pumpkin seeds.  Peas,  lentils, and lima beans.  Whole grain and bran cereals and breads.  Fresh fruit and vegetables, such as apricots, avocado, bananas, cantaloupe, kiwi, oranges, tomatoes, asparagus, and potatoes.  Orange and tomato juices.  Red meats.  Fruit yogurt. HOME CARE INSTRUCTIONS  Take all medicines as prescribed by your health care provider.  Maintain a healthy diet by including nutritious food, such as fruits, vegetables, nuts, whole grains, and lean meats.  If you are taking a laxative, be sure to follow the directions on the label. SEEK MEDICAL CARE IF:  Your weakness  gets worse.  You feel your heart pounding or racing.  You are vomiting or having diarrhea.  You are diabetic and having trouble keeping your blood glucose in the normal range. SEEK IMMEDIATE MEDICAL CARE IF:  You have chest pain, shortness of breath, or dizziness.  You are vomiting or having diarrhea for more than 2 days.  You faint. MAKE SURE YOU:   Understand these instructions.  Will watch your condition.  Will get help right away if you are not doing well or get worse.   This information is not intended to replace advice given to you by your health care provider. Make sure you discuss any questions you have with your health care provider.   Document Released: 02/24/2005 Document Revised: 03/17/2014 Document Reviewed: 08/27/2012 Elsevier Interactive Patient Education [employer]
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
## 2014-12-22 ENCOUNTER — Ambulatory Visit: Payer: BLUE CROSS/BLUE SHIELD | Admitting: Family Medicine

## 2014-12-25 ENCOUNTER — Encounter: Payer: Self-pay | Admitting: Family Medicine

## 2014-12-25 ENCOUNTER — Ambulatory Visit (INDEPENDENT_AMBULATORY_CARE_PROVIDER_SITE_OTHER): Payer: BLUE CROSS/BLUE SHIELD | Admitting: Family Medicine

## 2014-12-25 ENCOUNTER — Ambulatory Visit: Payer: BLUE CROSS/BLUE SHIELD | Admitting: Family Medicine

## 2014-12-25 VITALS — BP 120/78 | HR 101 | Temp 98.1°F | Ht 61.0 in | Wt 110.4 lb

## 2014-12-25 DIAGNOSIS — M545 Low back pain, unspecified: Secondary | ICD-10-CM

## 2014-12-25 DIAGNOSIS — G8929 Other chronic pain: Secondary | ICD-10-CM | POA: Diagnosis not present

## 2014-12-25 NOTE — Progress Notes (Signed)
BP 120/78 mmHg  Pulse 101  Temp(Src) 98.1 F (36.7 C) (Oral)  Ht  (1.549 m)  Wt 110 lb 6.4 oz (50.077 kg)  BMI 20.87 kg/m2   Subjective:    Patient ID: [name], female    DOB: 29-Aug-1980, 34 y.o.   MRN: [id number]  HPI: [name] is a 34 y.o. female presenting on 12/25/2014 for Discuss pain medication for low back pain, dental pain   HPI Chronic low back pain Patient presents today with chronic low back pain and wanting narcotic medication for such. We did a referral to pain management who she saw and they did an epidural injection but would wait to prescribe any medications until they received her records from her previous pain management clinic. She comes in asking today if we can give her something in the meantime. In the records in [facility] at 2 ER visits on 9/29 and 10/8 of this year she has to urine drug screens which both came back positive for cocaine.  Relevant past medical, surgical, family and social history reviewed and updated as indicated. Interim medical history since our last visit reviewed. Allergies and medications reviewed and updated.  Review of Systems  Constitutional: Negative for fever and chills.  HENT: Positive for dental problem and mouth sores. Negative for congestion, ear discharge and ear pain.   Eyes: Negative for redness and visual disturbance.  Respiratory: Negative for chest tightness and shortness of breath.   Cardiovascular: Negative for chest pain and leg swelling.  Genitourinary: Negative for dysuria and difficulty urinating.  Musculoskeletal: Positive for back pain. Negative for gait problem.  Skin: Negative for rash.  Neurological: Negative for light-headedness and headaches.  Psychiatric/Behavioral: Negative for behavioral problems and agitation.  All other systems reviewed and are negative.   Per HPI unless specifically indicated above     Medication List       This list is accurate as of: 12/25/14 12:47 PM.  Always  use your most recent med list.               ALPRAZolam 0.5 MG tablet  Commonly known as:  XANAX  Take 1 tablet by mouth 2 (two) times daily.     amitriptyline 50 MG tablet  Commonly known as:  ELAVIL  Take 1/2 tablet for first week and then one pill at bedtime.     aspirin-acetaminophen-caffeine 250-250-65 MG tablet  Commonly known as:  EXCEDRIN MIGRAINE  Take 2 tablets by mouth every 6 (six) hours as needed for migraine.     diphenhydrAMINE 25 MG tablet  Commonly known as:  BENADRYL  Take 25-50 mg by mouth every 6 (six) hours as needed for allergies.     ondansetron 4 MG tablet  Commonly known as:  ZOFRAN  Take 1 tablet (4 mg total) by mouth every 8 (eight) hours as needed for nausea or vomiting.     potassium chloride 10 MEQ tablet  Commonly known as:  K-DUR  Take 2 tablets (20 mEq total) by mouth 2 (two) times daily.     traMADol 50 MG tablet  Commonly known as:  ULTRAM  Take 1-2 tablets (50-100 mg total) by mouth every 6 (six) hours as needed. pain           Objective:    BP 120/78 mmHg  Pulse 101  Temp(Src) 98.1 F (36.7 C) (Oral)  Ht  (1.549 m)  Wt 110 lb 6.4 oz (50.077 kg)  BMI 20.87 kg/m2  Wt  Readings from Last 3 Encounters:  12/25/14 110 lb 6.4 oz (50.077 kg)  12/16/14 108 lb (48.988 kg)  12/15/14 108 lb (48.988 kg)    Physical Exam  Constitutional: She is oriented to person, place, and time. She appears well-developed and well-nourished. No distress.  HENT:  Mouth/Throat: Oropharynx is clear and moist and mucous membranes are normal. Abnormal dentition (very poor dentition with multiple lost teeth.). Dental caries (throughout all of her teeth.) present.    Eyes: Conjunctivae and EOM are normal. Pupils are equal, round, and reactive to light.  Cardiovascular: Normal rate, regular rhythm, normal heart sounds and intact distal pulses.   No murmur heard. Pulmonary/Chest: Effort normal and breath sounds normal. No respiratory distress. She has  no wheezes.  Musculoskeletal: Normal range of motion. She exhibits no edema.       Lumbar back: She exhibits tenderness (paraspinal muscle tenderness bilaterally). She exhibits normal range of motion (Negative straight leg raise), no swelling, no edema and no deformity.  Neurological: She is alert and oriented to person, place, and time. Coordination normal.  Skin: Skin is warm and dry. No rash noted. She is not diaphoretic.  Psychiatric: She has a normal mood and affect. Her behavior is normal.  Vitals reviewed.   Results for orders placed or performed during the hospital encounter of 12/16/14  Comprehensive metabolic panel  Result Value Ref Range   Sodium 133 (L) 135 - 145 mmol/L   Potassium 2.7 (LL) 3.5 - 5.1 mmol/L   Chloride 93 (L) 101 - 111 mmol/L   CO2 26 22 - 32 mmol/L   Glucose, Bld 97 65 - 99 mg/dL   BUN 5 (L) 6 - 20 mg/dL   Creatinine, Ser 1.61 0.44 - 1.00 mg/dL   Calcium 8.7 (L) 8.9 - 10.3 mg/dL   Total Protein 7.9 6.5 - 8.1 g/dL   Albumin 4.4 3.5 - 5.0 g/dL   AST 26 15 - 41 U/L   ALT 28 14 - 54 U/L   Alkaline Phosphatase 108 38 - 126 U/L   Total Bilirubin 0.7 0.3 - 1.2 mg/dL   GFR calc non Af Amer >60 >60 mL/min   GFR calc Af Amer >60 >60 mL/min   Anion gap 14 5 - 15  CBC with Differential  Result Value Ref Range   WBC 10.9 (H) 4.0 - 10.5 K/uL   RBC 3.84 (L) 3.87 - 5.11 MIL/uL   Hemoglobin 13.5 12.0 - 15.0 g/dL   HCT 09.6 04.5 - 40.9 %   MCV 98.7 78.0 - 100.0 fL   MCH 35.2 (H) 26.0 - 34.0 pg   MCHC 35.6 30.0 - 36.0 g/dL   RDW 81.1 91.4 - 78.2 %   Platelets 331 150 - 400 K/uL   Neutrophils Relative % 53 %   Neutro Abs 5.8 1.7 - 7.7 K/uL   Lymphocytes Relative 36 %   Lymphs Abs 3.9 0.7 - 4.0 K/uL   Monocytes Relative 9 %   Monocytes Absolute 1.0 0.1 - 1.0 K/uL   Eosinophils Relative 2 %   Eosinophils Absolute 0.2 0.0 - 0.7 K/uL   Basophils Relative 0 %   Basophils Absolute 0.0 0.0 - 0.1 K/uL  Lipase, blood  Result Value Ref Range   Lipase 15 (L) 22 - 51  U/L  Urine rapid drug screen (hosp performed)  Result Value Ref Range   Opiates POSITIVE (A) NONE DETECTED   Cocaine POSITIVE (A) NONE DETECTED   Benzodiazepines POSITIVE (A) NONE DETECTED   Amphetamines  NONE DETECTED NONE DETECTED   Tetrahydrocannabinol NONE DETECTED NONE DETECTED   Barbiturates NONE DETECTED NONE DETECTED      Assessment & Plan:   Problem List Items Addressed This Visit    None    Visit Diagnoses    Chronic bilateral low back pain without sciatica    -  Primary    With chronic pain and artery seen pain management recommend to work with them for injections to help her get better, with UDS cannot prescribe narcotics.        Follow up plan: No Follow-up on file.  Arville CareJoshua Dettinger, MD [facility] 12/25/2014, 12:47 PM

## 2014-12-25 NOTE — Patient Instructions (Signed)

## 2015-05-25 ENCOUNTER — Emergency Department (HOSPITAL_COMMUNITY)
Admission: EM | Admit: 2015-05-25 | Discharge: 2015-05-25 | Disposition: A | Discharge status: Home / Self Care | Payer: BLUE CROSS/BLUE SHIELD | Attending: Dermatology | Admitting: Dermatology

## 2015-05-25 ENCOUNTER — Encounter (HOSPITAL_COMMUNITY): Payer: Self-pay | Admitting: *Deleted

## 2015-05-25 DIAGNOSIS — R109 Unspecified abdominal pain: Secondary | ICD-10-CM | POA: Diagnosis not present

## 2015-05-25 DIAGNOSIS — Z5321 Procedure and treatment not carried out due to patient leaving prior to being seen by health care provider: Secondary | ICD-10-CM | POA: Diagnosis not present

## 2015-05-25 DIAGNOSIS — F17210 Nicotine dependence, cigarettes, uncomplicated: Secondary | ICD-10-CM | POA: Diagnosis not present

## 2015-05-25 NOTE — ED Notes (Signed)
Pt seen leaving ED by nurse first, pt approached and confirmed that she was leaving and that she would come back if things got worse.  Pt notified of risks of leaving.

## 2015-05-25 NOTE — ED Notes (Signed)
Pt reports abd pain that started this morning. Reports n/d.

## 2015-06-20 ENCOUNTER — Encounter (HOSPITAL_COMMUNITY): Payer: Self-pay

## 2015-06-20 ENCOUNTER — Emergency Department (HOSPITAL_COMMUNITY)
Admission: EM | Admit: 2015-06-20 | Discharge: 2015-06-20 | Disposition: A | Discharge status: Home / Self Care | Payer: BLUE CROSS/BLUE SHIELD | Attending: Emergency Medicine | Admitting: Emergency Medicine

## 2015-06-20 DIAGNOSIS — F17210 Nicotine dependence, cigarettes, uncomplicated: Secondary | ICD-10-CM | POA: Insufficient documentation

## 2015-06-20 DIAGNOSIS — H9203 Otalgia, bilateral: Secondary | ICD-10-CM | POA: Diagnosis present

## 2015-06-20 DIAGNOSIS — R0981 Nasal congestion: Secondary | ICD-10-CM | POA: Diagnosis not present

## 2015-06-20 DIAGNOSIS — H6093 Unspecified otitis externa, bilateral: Secondary | ICD-10-CM | POA: Diagnosis not present

## 2015-06-20 DIAGNOSIS — H65191 Other acute nonsuppurative otitis media, right ear: Secondary | ICD-10-CM | POA: Diagnosis not present

## 2015-06-20 MED ORDER — CLINDAMYCIN HCL 300 MG PO CAPS: 300.0000 mg | ORAL_CAPSULE | Freq: Three times a day (TID) | ORAL | Status: DC

## 2015-06-20 MED ORDER — DEXAMETHASONE 4 MG PO TABS: 4.0000 mg | ORAL_TABLET | Freq: Two times a day (BID) | ORAL | Status: DC

## 2015-06-20 MED ORDER — NEOMYCIN-POLYMYXIN-HC 3.5-10000-1 OT SUSP: 3.0000 [drp] | Freq: Three times a day (TID) | OTIC | Status: DC

## 2015-06-20 NOTE — ED Notes (Signed)
Pt c/o bilateral ear pain for the past few days.

## 2015-06-20 NOTE — Discharge Instructions (Signed)
Please use 3 drops of cortisporin in each ear three times daily for 7 days. Use  Otitis Externa Otitis externa is a germ infection in the outer ear. The outer ear is the area from the eardrum to the outside of the ear. Otitis externa is sometimes called "swimmer's ear." HOME CARE  Put drops in the ear as told by your doctor.  Only take medicine as told by your doctor.  If you have diabetes, your doctor may give you more directions. Follow your doctor's directions.  Keep all doctor visits as told. To avoid another infection:  Keep your ear dry. Use the corner of a towel to dry your ear after swimming or bathing.  Avoid scratching or putting things inside your ear.  Avoid swimming in lakes, dirty water, or pools that use a chemical called chlorine poorly.  You may use ear drops after swimming. Combine equal amounts of white vinegar and alcohol in a bottle. Put 3 or 4 drops in each ear. GET HELP IF:   You have a fever.  Your ear is still red, puffy (swollen), or painful after 3 days.  You still have yellowish-white fluid (pus) coming from the ear after 3 days.  Your redness, puffiness, or pain gets worse.  You have a really bad headache.  You have redness, puffiness, pain, or tenderness behind your ear. MAKE SURE YOU:   Understand these instructions.  Will watch your condition.  Will get help right away if you are not doing well or get worse.   This information is not intended to replace advice given to you by your health care provider. Make sure you discuss any questions you have with your health care provider.   Document Released: 08/13/2007 Document Revised: 03/17/2014 Document Reviewed: 03/13/2011 Elsevier Interactive Patient Education 2016 [employer]  Otitis Media, Adult Otitis media is redness, soreness, and puffiness (swelling) in the space just behind your eardrum (middle ear). It may be caused by allergies or infection. It often happens along with a  cold. HOME CARE  Take your medicine as told. Finish it even if you start to feel better.  Only take over-the-counter or prescription medicines for pain, discomfort, or fever as told by your doctor.  Follow up with your doctor as told. GET HELP IF:  You have otitis media only in one ear, or bleeding from your nose, or both.  You notice a lump on your neck.  You are not getting better in 3-5 days.  You feel worse instead of better. GET HELP RIGHT AWAY IF:   You have pain that is not helped with medicine.  You have puffiness, redness, or pain around your ear.  You get a stiff neck.  You cannot move part of your face (paralysis).  You notice that the bone behind your ear hurts when you touch it. MAKE SURE YOU:   Understand these instructions.  Will watch your condition.  Will get help right away if you are not doing well or get worse.   This information is not intended to replace advice given to you by your health care provider. Make sure you discuss any questions you have with your health care provider.   Document Released: 08/13/2007 Document Revised: 03/17/2014 Document Reviewed: 09/21/2012 Elsevier Interactive Patient Education 2016 [employer] Cleocin and Decadron daily with food until all taken. See your primary MD, or MD at the [facility] if not improving. Please do not put objects (Q-Tips, finger nails, etc) in your ears.

## 2015-06-20 NOTE — ED Provider Notes (Signed)
CSN: [id number]     Arrival date & time 06/20/15  1023 History   First MD Initiated Contact with Patient 06/20/15 1117     Chief Complaint  Patient presents with  . Otalgia     (Consider location/radiation/quality/duration/timing/severity/associated sxs/prior Treatment) HPI Comments: Patient is a 35 year old female who presents to the emergency department with a complaint of bilateral ear pain and drainage.  The patient states that over the last 3 days she's been having increasing pain and some drainage from both ears. She denies any high fevers. She reports that she rarely puts anything in her ears, but occasionally uses Q-tips. She's not been doing any excessive swimming. She's not had any operations or procedures involving her ears. There's been no high fevers reported. The patient states that she does have some congestion, and has had a few cold symptoms recently. No blood from the years, but she states that she has noticed a slightly yellow drainage a few times in the last reading days.  Patient is a 35 y.o. female presenting with ear pain. The history is provided by the patient.  Otalgia Location:  Bilateral Associated symptoms: congestion   Associated symptoms: no hearing loss     Past Medical History  Diagnosis Date  . Hypokalemia   . Narcotic abuse     5 yrs ago lortab, off few yrs  . Anxiety   . Chronic pain   . Endometriosis   . Chronic headache   . Chronic back pain   . Chronic pelvic pain in female   . Heart murmur     as a child  . Pneumonia     17 years  old  . Hx of opioid abuse   . Renal disorder Cyst on Kidney   Past Surgical History  Procedure Laterality Date  . Tonsillectomy    . Cholecystectomy  2010  . Tubal ligation    . Carpal tunnel release      both  . Incision and drainage abscess N/A 07/14/2014    Procedure: INCISION AND DRAINAGE VULVAR ABSCESS;  Surgeon: [name], MD;  Location: [facility];  Service: Gynecology;  Laterality: N/A;  .  Abdominal hysterectomy  2007    left ovary remains  . Laparoscopic salpingo oopherectomy Left 08/15/2014    Procedure: LAPAROSCOPIC LEFT SALPINGO OOPHORECTOMY;  Surgeon: [name], MD;  Location: [facility];  Service: Gynecology;  Laterality: Left;   Family History  Problem Relation Age of Onset  . Stroke Father   . Diverticulitis Father   . Heart disease    . Arthritis    . Diabetes    . Kidney disease    . Hypertension Mother   . Diabetes Mother    Social History  Substance Use Topics  . Smoking status: Current Every Day Smoker -- 1.00 packs/day for 18 years    Types: Cigarettes  . Smokeless tobacco: Never Used  . Alcohol Use: No     Comment: rarely   OB History    Gravida Para Term Preterm AB TAB SAB Ectopic Multiple Living   Review of Systems  HENT: Positive for congestion, ear pain and postnasal drip. Negative for hearing loss, trouble swallowing and voice change.   All other systems reviewed and are negative.     Allergies  Fentanyl; Divalproex sodium; Fioricet-codeine; Ibuprofen; and Ketorolac tromethamine  Home Medications   Prior to Admission medications   Medication  Sig Start Date End Date Taking? Authorizing Provider  ALPRAZolam Prudy Feeler(XANAX) 0.5 MG tablet Take 1 tablet by mouth 2 (two) times daily. 11/22/14   Historical Provider, MD  amitriptyline (ELAVIL) 50 MG tablet Take 1/2 tablet for first week and then one pill at bedtime. Patient taking differently: Take 50 mg by mouth at bedtime.  11/10/14   Elige RadonJoshua A Dettinger, MD  aspirin-acetaminophen-caffeine (EXCEDRIN MIGRAINE) [phone] MG per tablet Take 2 tablets by mouth every 6 (six) hours as needed for migraine.    Historical Provider, MD  clindamycin (CLEOCIN) 300 MG capsule Take 1 capsule (300 mg total) by mouth 3 (three) times daily. 06/20/15   Ivery QualeHobson Marcina Kinnison, PA-C  dexamethasone (DECADRON) 4 MG tablet Take 1 tablet (4 mg total) by mouth 2 (two) times daily with a meal. 06/20/15   Ivery QualeHobson Xariah Silvernail,  PA-C  diphenhydrAMINE (BENADRYL) 25 MG tablet Take 25-50 mg by mouth every 6 (six) hours as needed for allergies.    Historical Provider, MD  neomycin-polymyxin-hydrocortisone (CORTISPORIN) 3.5-10000-1 otic suspension Place 3 drops into both ears 3 (three) times daily. 06/20/15   Ivery QualeHobson Rinoa Garramone, PA-C  ondansetron (ZOFRAN) 4 MG tablet Take 1 tablet (4 mg total) by mouth every 8 (eight) hours as needed for nausea or vomiting. Patient not taking: Reported on 12/25/2014 12/16/14   Devoria AlbeIva Knapp, MD  potassium chloride (K-DUR) 10 MEQ tablet Take 2 tablets (20 mEq total) by mouth 2 (two) times daily. 12/16/14   Devoria AlbeIva Knapp, MD  traMADol (ULTRAM) 50 MG tablet Take 1-2 tablets (50-100 mg total) by mouth every 6 (six) hours as needed. pain Patient not taking: Reported on 12/15/2014 12/04/14   Tilda BurrowJohn Ferguson V, MD   BP 103/55 mmHg  Pulse 77  Temp(Src) 98.2 F (36.8 C)  Resp 16  Ht 5\' 1"  (1.549 m)  Wt 54.432 kg  BMI 22.69 kg/m2  SpO2 100% Physical Exam  Constitutional: She is oriented to person, place, and time. She appears well-developed and well-nourished.  Non-toxic appearance.  HENT:  Head: Normocephalic.  Right Ear: Tympanic membrane and external ear normal.  Left Ear: Tympanic membrane and external ear normal.  There is soreness of the right preauricular area. There is no redness or swelling in the mastoid area. There is increased redness and appears to be a scratch of the right external auditory canal. The right tympanic membrane is bulging and has some increased redness. The left EXTERNAL auditory canal has what appears to also be a scratched area. The tympanic membrane is within normal limits. No active drainage at this time.  Nasal congestion present.  Eyes: EOM and lids are normal. Pupils are equal, round, and reactive to light.  Neck: Normal range of motion. Neck supple. Carotid bruit is not present.  Cardiovascular: Normal rate, regular rhythm, normal heart sounds, intact distal pulses and normal  pulses.   Pulmonary/Chest: Breath sounds normal. No respiratory distress.  Abdominal: Soft. Bowel sounds are normal. There is no tenderness. There is no guarding.  Musculoskeletal: Normal range of motion.  Lymphadenopathy:       Head (right side): No submandibular adenopathy present.       Head (left side): No submandibular adenopathy present.    She has no cervical adenopathy.  Neurological: She is alert and oriented to person, place, and time. She has normal strength. No cranial nerve deficit or sensory deficit.  Skin: Skin is warm and dry.  Psychiatric: She has a normal mood and affect. Her speech is normal.  Nursing note and vitals reviewed.  ED Course  Procedures (including critical care time) Labs Review Labs Reviewed - No data to display  Imaging Review No results found. I have personally reviewed and evaluated these images and lab results as part of my medical decision-making.   EKG Interpretation None      MDM  Patient has on nasal congestion, but no high fevers reported. She has some scratched areas that appear to be in the external auditory canals. I have cautioned the patient about using Q-tips, fingernails, or anything else in her ears. I have prescribed Cortisporin otic 3 times daily. Also prescribed clindamycin  for both the scratched irritated areas of the external auditory canal, and the otitis media on the right. Patient will follow with her primary physician, or physicians at [facility] if not improving.    Final diagnoses:  Acute nonsuppurative otitis media of right ear  Otitis externa, bilateral    *I have reviewed nursing notes, vital signs, and all appropriate lab and imaging results for this patient.[address] PA-C 06/20/15 1209  Vanetta Mulders, MD 06/21/15 1715

## 2015-06-20 NOTE — ED Notes (Signed)
Instructed pt to take all of antibiotics as prescribed. 

## 2015-06-20 NOTE — ED Notes (Signed)
Pt with bilateral ear drainage yellow in color per pt for past 3 days, this is the forth day.  Unknown of fevers

## 2015-07-23 ENCOUNTER — Encounter (HOSPITAL_COMMUNITY): Payer: Self-pay | Admitting: *Deleted

## 2015-07-23 ENCOUNTER — Emergency Department (HOSPITAL_COMMUNITY)
Admission: EM | Admit: 2015-07-23 | Discharge: 2015-07-23 | Disposition: A | Discharge status: Home / Self Care | Payer: BLUE CROSS/BLUE SHIELD | Attending: Emergency Medicine | Admitting: Emergency Medicine

## 2015-07-23 ENCOUNTER — Emergency Department (HOSPITAL_COMMUNITY): Payer: BLUE CROSS/BLUE SHIELD

## 2015-07-23 DIAGNOSIS — R109 Unspecified abdominal pain: Secondary | ICD-10-CM | POA: Insufficient documentation

## 2015-07-23 DIAGNOSIS — N760 Acute vaginitis: Secondary | ICD-10-CM | POA: Diagnosis not present

## 2015-07-23 DIAGNOSIS — Z7982 Long term (current) use of aspirin: Secondary | ICD-10-CM | POA: Insufficient documentation

## 2015-07-23 DIAGNOSIS — R102 Pelvic and perineal pain: Secondary | ICD-10-CM

## 2015-07-23 DIAGNOSIS — N898 Other specified noninflammatory disorders of vagina: Secondary | ICD-10-CM | POA: Diagnosis present

## 2015-07-23 DIAGNOSIS — F17210 Nicotine dependence, cigarettes, uncomplicated: Secondary | ICD-10-CM | POA: Insufficient documentation

## 2015-07-23 DIAGNOSIS — B9689 Other specified bacterial agents as the cause of diseases classified elsewhere: Secondary | ICD-10-CM

## 2015-07-23 LAB — COMPREHENSIVE METABOLIC PANEL
ALBUMIN: 3.9 g/dL (ref 3.5–5.0)
ALT: 23 U/L (ref 14–54)
AST: 24 U/L (ref 15–41)
Alkaline Phosphatase: 115 U/L (ref 38–126)
Anion gap: 7 (ref 5–15)
BUN: 7 mg/dL (ref 6–20)
CHLORIDE: 104 mmol/L (ref 101–111)
CO2: 29 mmol/L (ref 22–32)
Calcium: 9.3 mg/dL (ref 8.9–10.3)
Creatinine, Ser: 0.55 mg/dL (ref 0.44–1.00)
GFR calc Af Amer: >60 mL/min (ref >60)
GFR calc non Af Amer: >60 mL/min (ref >60)
GLUCOSE: 89 mg/dL (ref 65–99)
POTASSIUM: 4.1 mmol/L (ref 3.5–5.1)
SODIUM: 140 mmol/L (ref 135–145)
Total Bilirubin: 0.4 mg/dL (ref 0.3–1.2)
Total Protein: 7.2 g/dL (ref 6.5–8.1)

## 2015-07-23 LAB — URINALYSIS, ROUTINE W REFLEX MICROSCOPIC
BILIRUBIN URINE: NEGATIVE
GLUCOSE, UA: NEGATIVE mg/dL
KETONES UR: NEGATIVE mg/dL
LEUKOCYTES UA: NEGATIVE
Nitrite: NEGATIVE
PH: 6 (ref 5.0–8.0)
Protein, ur: NEGATIVE mg/dL
SPECIFIC GRAVITY, URINE: 1.02 (ref 1.005–1.030)

## 2015-07-23 LAB — WET PREP, GENITAL
SPERM: NONE SEEN
Trich, Wet Prep: NONE SEEN
Yeast Wet Prep HPF POC: NONE SEEN

## 2015-07-23 LAB — CBC WITH DIFFERENTIAL/PLATELET
Basophils Absolute: 0 10*3/uL (ref 0.0–0.1)
Basophils Relative: 1 %
EOS PCT: 6 %
Eosinophils Absolute: 0.4 10*3/uL (ref 0.0–0.7)
HEMATOCRIT: 41.1 % (ref 36.0–46.0)
Hemoglobin: 13.4 g/dL (ref 12.0–15.0)
LYMPHS ABS: 3 10*3/uL (ref 0.7–4.0)
LYMPHS PCT: 43 %
MCH: 32.8 pg (ref 26.0–34.0)
MCHC: 32.6 g/dL (ref 30.0–36.0)
MCV: 100.7 fL — AB (ref 78.0–100.0)
MONO ABS: 0.6 10*3/uL (ref 0.1–1.0)
Monocytes Relative: 9 %
Neutro Abs: 2.8 10*3/uL (ref 1.7–7.7)
Neutrophils Relative %: 41 %
PLATELETS: 223 10*3/uL (ref 150–400)
RBC: 4.08 MIL/uL (ref 3.87–5.11)
RDW: 12.4 % (ref 11.5–15.5)
WBC: 6.8 10*3/uL (ref 4.0–10.5)

## 2015-07-23 LAB — URINE MICROSCOPIC-ADD ON

## 2015-07-23 LAB — LIPASE, BLOOD: Lipase: 19 U/L (ref 11–51)

## 2015-07-23 MED ORDER — ONDANSETRON HCL 4 MG/2ML IJ SOLN
4.0000 mg | Freq: Once | INTRAMUSCULAR | Status: AC
  Administered 2015-07-23: 4 mg via INTRAVENOUS
  Filled 2015-07-23: qty 2

## 2015-07-23 MED ORDER — HYDROMORPHONE HCL 1 MG/ML IJ SOLN
1.0000 mg | Freq: Once | INTRAMUSCULAR | Status: AC
  Administered 2015-07-23: 1 mg via INTRAVENOUS
  Filled 2015-07-23: qty 1

## 2015-07-23 MED ORDER — ONDANSETRON HCL 4 MG PO TABS: 4.0000 mg | ORAL_TABLET | Freq: Four times a day (QID) | ORAL | Status: DC | PRN

## 2015-07-23 MED ORDER — METRONIDAZOLE 500 MG PO TABS: 500.0000 mg | ORAL_TABLET | Freq: Two times a day (BID) | ORAL | Status: DC

## 2015-07-23 MED ORDER — IOPAMIDOL (ISOVUE-300) INJECTION 61%
100.0000 mL | Freq: Once | INTRAVENOUS | Status: AC | PRN
  Administered 2015-07-23: 100 mL via INTRAVENOUS

## 2015-07-23 MED ORDER — MORPHINE SULFATE (PF) 4 MG/ML IV SOLN: 4.0000 mg | Freq: Once | INTRAVENOUS | Status: DC

## 2015-07-23 MED ORDER — OXYCODONE-ACETAMINOPHEN 7.5-325 MG PO TABS: 1.0000 | ORAL_TABLET | ORAL | Status: DC | PRN

## 2015-07-23 NOTE — Discharge Instructions (Signed)
Your evaluation here did not show a clear cause for your pain. I suspect that is related to scar tissue from your previous surgeries. Please make an appointment with her gynecologist for further evaluation. If pain is not being adequately controlled or is getting worse, then returned to the emergency department. Also, return if you start running a fever or have uncontrolled vomiting.  Pelvic Pain, Female Female pelvic pain can be caused by many different things and start from a variety of places. Pelvic pain refers to pain that is located in the lower half of the abdomen and between your hips. The pain may occur over a short period of time (acute) or may be reoccurring (chronic). The cause of pelvic pain may be related to disorders affecting the female reproductive organs (gynecologic), but it may also be related to the bladder, kidney stones, an intestinal complication, or muscle or skeletal problems. Getting help right away for pelvic pain is important, especially if there has been severe, sharp, or a sudden onset of unusual pain. It is also important to get help right away because some types of pelvic pain can be life threatening.  CAUSES  Below are only some of the causes of pelvic pain. The causes of pelvic pain can be in one of several categories.   Gynecologic.  Pelvic inflammatory disease.  Sexually transmitted infection.  Ovarian cyst or a twisted ovarian ligament (ovarian torsion).  Uterine lining that grows outside the uterus (endometriosis).  Fibroids, cysts, or tumors.  Ovulation.  Pregnancy.  Pregnancy that occurs outside the uterus (ectopic pregnancy).  Miscarriage.  Labor.  Abruption of the placenta or ruptured uterus.  Infection.  Uterine infection (endometritis).  Bladder infection.  Diverticulitis.  Miscarriage related to a uterine infection (septic abortion).  Bladder.  Inflammation of the bladder (cystitis).  Kidney  stone(s).  Gastrointestinal.  Constipation.  Diverticulitis.  Neurologic.  Trauma.  Feeling pelvic pain because of mental or emotional causes (psychosomatic).  Cancers of the bowel or pelvis. EVALUATION  Your caregiver will want to take a careful history of your concerns. This includes recent changes in your health, a careful gynecologic history of your periods (menses), and a sexual history. Obtaining your family history and medical history is also important. Your caregiver may suggest a pelvic exam. A pelvic exam will help identify the location and severity of the pain. It also helps in the evaluation of which organ system may be involved. In order to identify the cause of the pelvic pain and be properly treated, your caregiver may order tests. These tests may include:   A pregnancy test.  Pelvic ultrasonography.  An X-ray exam of the abdomen.  A urinalysis or evaluation of vaginal discharge.  Blood tests. HOME CARE INSTRUCTIONS   Only take over-the-counter or prescription medicines for pain, discomfort, or fever as directed by your caregiver.   Rest as directed by your caregiver.   Eat a balanced diet.   Drink enough fluids to make your urine clear or pale yellow, or as directed.   Avoid sexual intercourse if it causes pain.   Apply warm or cold compresses to the lower abdomen depending on which one helps the pain.   Avoid stressful situations.   Keep a journal of your pelvic pain. Write down when it started, where the pain is located, and if there are things that seem to be associated with the pain, such as food or your menstrual cycle.  Follow up with your caregiver as directed.  SEEK MEDICAL CARE  IF:  Your medicine does not help your pain.  You have abnormal vaginal discharge. SEEK IMMEDIATE MEDICAL CARE IF:   You have heavy bleeding from the vagina.   Your pelvic pain increases.   You feel light-headed or faint.   You have chills.   You  have pain with urination or blood in your urine.   You have uncontrolled diarrhea or vomiting.   You have a fever or persistent symptoms for more than 3 days.  You have a fever and your symptoms suddenly get worse.   You are being physically or sexually abused.   This information is not intended to replace advice given to you by your health care provider. Make sure you discuss any questions you have with your health care provider.   Document Released: 01/22/2004 Document Revised: 11/15/2014 Document Reviewed: 06/16/2011 Elsevier Interactive Patient Education 2016 Else[employer] Inc.   Bacterial Vaginosis Bacterial vaginosis is a vaginal infection that occurs when the normal balance of bacteria in the vagina is disrupted. It results from an overgrowth of certain bacteria. This is the most common vaginal infection in women of childbearing age. Treatment is important to prevent complications, especially in pregnant women, as it can cause a premature delivery. CAUSES  Bacterial vaginosis is caused by an increase in harmful bacteria that are normally present in smaller amounts in the vagina. Several different kinds of bacteria can cause bacterial vaginosis. However, the reason that the condition develops is not fully understood. RISK FACTORS Certain activities or behaviors can put you at an increased risk of developing bacterial vaginosis, including:  Having a new sex partner or multiple sex partners.  Douching.  Using an intrauterine device (IUD) for contraception. Women do not get bacterial vaginosis from toilet seats, bedding, swimming pools, or contact with objects around them. SIGNS AND SYMPTOMS  Some women with bacterial vaginosis have no signs or symptoms. Common symptoms include:  Grey vaginal discharge.  A fishlike odor with discharge, especially after sexual intercourse.  Itching or burning of the vagina and vulva.  Burning or pain with urination. DIAGNOSIS  Your health  care provider will take a medical history and examine the vagina for signs of bacterial vaginosis. A sample of vaginal fluid may be taken. Your health care provider will look at this sample under a microscope to check for bacteria and abnormal cells. A vaginal pH test may also be done.  TREATMENT  Bacterial vaginosis may be treated with antibiotic medicines. These may be given in the form of a pill or a vaginal cream. A second round of antibiotics may be prescribed if the condition comes back after treatment. Because bacterial vaginosis increases your risk for sexually transmitted diseases, getting treated can help reduce your risk for chlamydia, gonorrhea, HIV, and herpes. HOME CARE INSTRUCTIONS   Only take over-the-counter or prescription medicines as directed by your health care provider.  If antibiotic medicine was prescribed, take it as directed. Make sure you finish it even if you start to feel better.  Tell all sexual partners that you have a vaginal infection. They should see their health care provider and be treated if they have problems, such as a mild rash or itching.  During treatment, it is important that you follow these instructions:  Avoid sexual activity or use condoms correctly.  Do not douche.  Avoid alcohol as directed by your health care provider.  Avoid breastfeeding as directed by your health care provider. SEEK MEDICAL CARE IF:   Your symptoms are not improving  after 3 days of treatment.  You have increased discharge or pain.  You have a fever. MAKE SURE YOU:   Understand these instructions.  Will watch your condition.  Will get help right away if you are not doing well or get worse. FOR MORE INFORMATION  Centers for Disease Control and Prevention, Division of STD Prevention: [employer] American Sexual Health Association (ASHA): www.ashastd.org    This information is not intended to replace advice given to you by your health care provider. Make sure  you discuss any questions you have with your health care provider.   Document Released: 02/24/2005 Document Revised: 03/17/2014 Document Reviewed: 10/06/2012 Elsevier Interactive Patient Education 2016 [employer] Inc.  Acetaminophen; Oxycodone tablets What is this medicine? ACETAMINOPHEN; OXYCODONE (a set a MEE noe fen; ox i KOE done) is a pain reliever. It is used to treat moderate to severe pain. This medicine may be used for other purposes; ask your health care provider or pharmacist if you have questions. What should I tell my health care provider before I take this medicine? They need to know if you have any of these conditions: -brain tumor -Crohn's disease, inflammatory bowel disease, or ulcerative colitis -drug abuse or addiction -head injury -heart or circulation problems -if you often drink alcohol -kidney disease or problems going to the bathroom -liver disease -lung disease, asthma, or breathing problems -an unusual or allergic reaction to acetaminophen, oxycodone, other opioid analgesics, other medicines, foods, dyes, or preservatives -pregnant or trying to get pregnant -breast-feeding How should I use this medicine? Take this medicine by mouth with a full glass of water. Follow the directions on the prescription label. You can take it with or without food. If it upsets your stomach, take it with food. Take your medicine at regular intervals. Do not take it more often than directed. Talk to your pediatrician regarding the use of this medicine in children. Special care may be needed. Patients over 26 years old may have a stronger reaction and need a smaller dose. Overdosage: If you think you have taken too much of this medicine contact a poison control center or emergency room at once. NOTE: This medicine is only for you. Do not share this medicine with others. What if I miss a dose? If you miss a dose, take it as soon as you can. If it is almost time for your next dose, take  only that dose. Do not take double or extra doses. What may interact with this medicine? -alcohol -antihistamines -barbiturates like amobarbital, butalbital, butabarbital, methohexital, pentobarbital, phenobarbital, thiopental, and secobarbital -benztropine -drugs for bladder problems like solifenacin, trospium, oxybutynin, tolterodine, hyoscyamine, and methscopolamine -drugs for breathing problems like ipratropium and tiotropium -drugs for certain stomach or intestine problems like propantheline, homatropine methylbromide, glycopyrrolate, atropine, belladonna, and dicyclomine -general anesthetics like etomidate, ketamine, nitrous oxide, propofol, desflurane, enflurane, halothane, isoflurane, and sevoflurane -medicines for depression, anxiety, or psychotic disturbances -medicines for sleep -muscle relaxants -naltrexone -narcotic medicines (opiates) for pain -phenothiazines like perphenazine, thioridazine, chlorpromazine, mesoridazine, fluphenazine, prochlorperazine, promazine, and trifluoperazine -scopolamine -tramadol -trihexyphenidyl This list may not describe all possible interactions. Give your health care provider a list of all the medicines, herbs, non-prescription drugs, or dietary supplements you use. Also tell them if you smoke, drink alcohol, or use illegal drugs. Some items may interact with your medicine. What should I watch for while using this medicine? Tell your doctor or health care professional if your pain does not go away, if it gets worse, or if you have new  or a different type of pain. You may develop tolerance to the medicine. Tolerance means that you will need a higher dose of the medication for pain relief. Tolerance is normal and is expected if you take this medicine for a long time. Do not suddenly stop taking your medicine because you may develop a severe reaction. Your body becomes used to the medicine. This does NOT mean you are addicted. Addiction is a behavior  related to getting and using a drug for a non-medical reason. If you have pain, you have a medical reason to take pain medicine. Your doctor will tell you how much medicine to take. If your doctor wants you to stop the medicine, the dose will be slowly lowered over time to avoid any side effects. You may get drowsy or dizzy. Do not drive, use machinery, or do anything that needs mental alertness until you know how this medicine affects you. Do not stand or sit up quickly, especially if you are an older patient. This reduces the risk of dizzy or fainting spells. Alcohol may interfere with the effect of this medicine. Avoid alcoholic drinks. There are different types of narcotic medicines (opiates) for pain. If you take more than one type at the same time, you may have more side effects. Give your health care provider a list of all medicines you use. Your doctor will tell you how much medicine to take. Do not take more medicine than directed. Call emergency for help if you have problems breathing. The medicine will cause constipation. Try to have a bowel movement at least every 2 to 3 days. If you do not have a bowel movement for 3 days, call your doctor or health care professional. Do not take Tylenol (acetaminophen) or medicines that have acetaminophen with this medicine. Too much acetaminophen can be very dangerous. Many nonprescription medicines contain acetaminophen. Always read the labels carefully to avoid taking more acetaminophen. What side effects may I notice from receiving this medicine? Side effects that you should report to your doctor or health care professional as soon as possible: -allergic reactions like skin rash, itching or hives, swelling of the face, lips, or tongue -breathing difficulties, wheezing -confusion -light headedness or fainting spells -severe stomach pain -unusually weak or tired -yellowing of the skin or the whites of the eyes Side effects that usually do not require  medical attention (report to your doctor or health care professional if they continue or are bothersome): -dizziness -drowsiness -nausea -vomiting This list may not describe all possible side effects. Call your doctor for medical advice about side effects. You may report side effects to FDA at 1-800-FDA-1088. Where should I keep my medicine? Keep out of the reach of children. This medicine can be abused. Keep your medicine in a safe place to protect it from theft. Do not share this medicine with anyone. Selling or giving away this medicine is dangerous and against the law. This medicine may cause accidental overdose and death if it taken by other adults, children, or pets. Mix any unused medicine with a substance like cat litter or coffee grounds. Then throw the medicine away in a sealed container like a sealed bag or a coffee can with a lid. Do not use the medicine after the expiration date. Store at room temperature between 20 and 25 degrees C (68 and 77 degrees F). NOTE: This sheet is a summary. It may not cover all possible information. If you have questions about this medicine, talk to your doctor, pharmacist,  or health care provider.    2016, Elsevier/Gold Standard. (2014-01-25 15:18:46)  Ondansetron tablets What is this medicine? ONDANSETRON (on DAN se tron) is used to treat nausea and vomiting caused by chemotherapy. It is also used to prevent or treat nausea and vomiting after surgery. This medicine may be used for other purposes; ask your health care provider or pharmacist if you have questions. What should I tell my health care provider before I take this medicine? They need to know if you have any of these conditions: -heart disease -history of irregular heartbeat -liver disease -low levels of magnesium or potassium in the blood -an unusual or allergic reaction to ondansetron, granisetron, other medicines, foods, dyes, or preservatives -pregnant or trying to get  pregnant -breast-feeding How should I use this medicine? Take this medicine by mouth with a glass of water. Follow the directions on your prescription label. Take your doses at regular intervals. Do not take your medicine more often than directed. Talk to your pediatrician regarding the use of this medicine in children. Special care may be needed. Overdosage: If you think you have taken too much of this medicine contact a poison control center or emergency room at once. NOTE: This medicine is only for you. Do not share this medicine with others. What if I miss a dose? If you miss a dose, take it as soon as you can. If it is almost time for your next dose, take only that dose. Do not take double or extra doses. What may interact with this medicine? Do not take this medicine with any of the following medications: -apomorphine -certain medicines for fungal infections like fluconazole, itraconazole, ketoconazole, posaconazole, voriconazole -cisapride -dofetilide -dronedarone -pimozide -thioridazine -ziprasidone This medicine may also interact with the following medications: -carbamazepine -certain medicines for depression, anxiety, or psychotic disturbances -fentanyl -linezolid -MAOIs like Carbex, Eldepryl, Marplan, Nardil, and Parnate -methylene blue (injected into a vein) -other medicines that prolong the QT interval (cause an abnormal heart rhythm) -phenytoin -rifampicin -tramadol This list may not describe all possible interactions. Give your health care provider a list of all the medicines, herbs, non-prescription drugs, or dietary supplements you use. Also tell them if you smoke, drink alcohol, or use illegal drugs. Some items may interact with your medicine. What should I watch for while using this medicine? Check with your doctor or health care professional right away if you have any sign of an allergic reaction. What side effects may I notice from receiving this medicine? Side  effects that you should report to your doctor or health care professional as soon as possible: -allergic reactions like skin rash, itching or hives, swelling of the face, lips or tongue -breathing problems -confusion -dizziness -fast or irregular heartbeat -feeling faint or lightheaded, falls -fever and chills -loss of balance or coordination -seizures -sweating -swelling of the hands or feet -tightness in the chest -tremors -unusually weak or tired Side effects that usually do not require medical attention (report to your doctor or health care professional if they continue or are bothersome): -constipation or diarrhea -headache This list may not describe all possible side effects. Call your doctor for medical advice about side effects. You may report side effects to FDA at 1-800-FDA-1088. Where should I keep my medicine? Keep out of the reach of children. Store between 2 and 30 degrees C (36 and 86 degrees F). Throw away any unused medicine after the expiration date. NOTE: This sheet is a summary. It may not cover all possible information. If you  have questions about this medicine, talk to your doctor, pharmacist, or health care provider.    2016, Elsevier/Gold Standard. (2012-12-01 16:27:45)  Metronidazole tablets or capsules What is this medicine? METRONIDAZOLE (me troe NI da zole) is an antiinfective. It is used to treat certain kinds of bacterial and protozoal infections. It will not work for colds, flu, or other viral infections. This medicine may be used for other purposes; ask your health care provider or pharmacist if you have questions. What should I tell my health care provider before I take this medicine? They need to know if you have any of these conditions: -anemia or other blood disorders -disease of the nervous system -fungal or yeast infection -if you drink alcohol containing drinks -liver disease -seizures -an unusual or allergic reaction to metronidazole, or  other medicines, foods, dyes, or preservatives -pregnant or trying to get pregnant -breast-feeding How should I use this medicine? Take this medicine by mouth with a full glass of water. Follow the directions on the prescription label. Take your medicine at regular intervals. Do not take your medicine more often than directed. Take all of your medicine as directed even if you think you are better. Do not skip doses or stop your medicine early. Talk to your pediatrician regarding the use of this medicine in children. Special care may be needed. Overdosage: If you think you have taken too much of this medicine contact a poison control center or emergency room at once. NOTE: This medicine is only for you. Do not share this medicine with others. What if I miss a dose? If you miss a dose, take it as soon as you can. If it is almost time for your next dose, take only that dose. Do not take double or extra doses. What may interact with this medicine? Do not take this medicine with any of the following medications: -alcohol or any product that contains alcohol -amprenavir oral solution -cisapride -disulfiram -dofetilide -dronedarone -paclitaxel injection -pimozide -ritonavir oral solution -sertraline oral solution -sulfamethoxazole-trimethoprim injection -thioridazine -ziprasidone This medicine may also interact with the following medications: -birth control pills -cimetidine -lithium -other medicines that prolong the QT interval (cause an abnormal heart rhythm) -phenobarbital -phenytoin -warfarin This list may not describe all possible interactions. Give your health care provider a list of all the medicines, herbs, non-prescription drugs, or dietary supplements you use. Also tell them if you smoke, drink alcohol, or use illegal drugs. Some items may interact with your medicine. What should I watch for while using this medicine? Tell your doctor or health care professional if your symptoms  do not improve or if they get worse. You may get drowsy or dizzy. Do not drive, use machinery, or do anything that needs mental alertness until you know how this medicine affects you. Do not stand or sit up quickly, especially if you are an older patient. This reduces the risk of dizzy or fainting spells. Avoid alcoholic drinks while you are taking this medicine and for three days afterward. Alcohol may make you feel dizzy, sick, or flushed. If you are being treated for a sexually transmitted disease, avoid sexual contact until you have finished your treatment. Your sexual partner may also need treatment. What side effects may I notice from receiving this medicine? Side effects that you should report to your doctor or health care professional as soon as possible: -allergic reactions like skin rash or hives, swelling of the face, lips, or tongue -confusion, clumsiness -difficulty speaking -discolored or sore mouth -dizziness -fever,  infection -numbness, tingling, pain or weakness in the hands or feet -trouble passing urine or change in the amount of urine -redness, blistering, peeling or loosening of the skin, including inside the mouth -seizures -unusually weak or tired -vaginal irritation, dryness, or discharge Side effects that usually do not require medical attention (report to your doctor or health care professional if they continue or are bothersome): -diarrhea -headache -irritability -metallic taste -nausea -stomach pain or cramps -trouble sleeping This list may not describe all possible side effects. Call your doctor for medical advice about side effects. You may report side effects to FDA at 1-800-FDA-1088. Where should I keep my medicine? Keep out of the reach of children. Store at room temperature below 25 degrees C (77 degrees F). Protect from light. Keep container tightly closed. Throw away any unused medicine after the expiration date. NOTE: This sheet is a summary. It may  not cover all possible information. If you have questions about this medicine, talk to your doctor, pharmacist, or health care provider.    2016, Elsevier/Gold Standard. (2012-10-01 14:08:39)

## 2015-07-23 NOTE — ED Provider Notes (Signed)
CSN: [id number]     Arrival date & time 07/23/15  0115 History   First MD Initiated Contact with Patient 07/23/15 [phone]     Chief Complaint  Patient presents with  . Vaginal Discharge     (Consider location/radiation/quality/duration/timing/severity/associated sxs/prior Treatment) Patient is a 35 y.o. female presenting with vaginal discharge. The history is provided by the patient.  Vaginal Discharge She is status post hysterectomy and bilateral salpingo-oophorectomy. She has been having suprapubic pain and vaginal discharge for 2 days. Symptoms started following intercourse. She noticed small amount of blood with her discharge, but that has resolved. Discharge is now clear and not malodorous. She denies pain during intercourse. Abdominal pain is across the suprapubic area and is worse when sitting. She describes a pressure feeling. She denies nausea or vomiting or diarrhea. Pain is currently rated at 7/10.  Past Medical History  Diagnosis Date  . Hypokalemia   . Narcotic abuse     5 yrs ago lortab, off few yrs  . Anxiety   . Chronic pain   . Endometriosis   . Chronic headache   . Chronic back pain   . Chronic pelvic pain in female   . Heart murmur     as a child  . Pneumonia     41 years  old  . Hx of opioid abuse   . Renal disorder Cyst on Kidney   Past Surgical History  Procedure Laterality Date  . Tonsillectomy    . Cholecystectomy  2010  . Tubal ligation    . Carpal tunnel release      both  . Incision and drainage abscess N/A 07/14/2014    Procedure: INCISION AND DRAINAGE VULVAR ABSCESS;  Surgeon: [name], MD;  Location: [facility];  Service: Gynecology;  Laterality: N/A;  . Abdominal hysterectomy  2007    left ovary remains  . Laparoscopic salpingo oopherectomy Left 08/15/2014    Procedure: LAPAROSCOPIC LEFT SALPINGO OOPHORECTOMY;  Surgeon: [name], MD;  Location: [facility];  Service: Gynecology;  Laterality: Left;   Family History  Problem Relation Age of  Onset  . Stroke Father   . Diverticulitis Father   . Heart disease    . Arthritis    . Diabetes    . Kidney disease    . Hypertension Mother   . Diabetes Mother    Social History  Substance Use Topics  . Smoking status: Current Every Day Smoker -- 1.00 packs/day for 18 years    Types: Cigarettes  . Smokeless tobacco: Never Used  . Alcohol Use: No     Comment: rarely   OB History    Gravida Para Term Preterm AB TAB SAB Ectopic Multiple Living   Review of Systems  Genitourinary: Positive for vaginal discharge.  All other systems reviewed and are negative.     Allergies  Fentanyl; Divalproex sodium; Fioricet-codeine; Ibuprofen; and Ketorolac tromethamine  Home Medications   Prior to Admission medications   Medication Sig Start Date End Date Taking? Authorizing Provider  ALPRAZolam Prudy Feeler) 0.5 MG tablet Take 1 tablet by mouth 2 (two) times daily. 11/22/14   Historical Provider, MD  amitriptyline (ELAVIL) 50 MG tablet Take 1/2 tablet for first week and then one pill at bedtime. Patient taking differently: Take 50 mg by mouth at bedtime.  11/10/14   Elige Radon Dettinger, MD  aspirin-acetaminophen-caffeine (EXCEDRIN MIGRAINE) [phone] MG per tablet Take 2 tablets  by mouth every 6 (six) hours as needed for migraine.    Historical Provider, MD  clindamycin (CLEOCIN) 300 MG capsule Take 1 capsule (300 mg total) by mouth 3 (three) times daily. 06/20/15   Ivery Quale, PA-C  dexamethasone (DECADRON) 4 MG tablet Take 1 tablet (4 mg total) by mouth 2 (two) times daily with a meal. 06/20/15   Ivery Quale, PA-C  diphenhydrAMINE (BENADRYL) 25 MG tablet Take 25-50 mg by mouth every 6 (six) hours as needed for allergies.    Historical Provider, MD  neomycin-polymyxin-hydrocortisone (CORTISPORIN) 3.5-10000-1 otic suspension Place 3 drops into both ears 3 (three) times daily. 06/20/15   Ivery Quale, PA-C  ondansetron (ZOFRAN) 4 MG tablet Take 1 tablet (4 mg total) by mouth  every 8 (eight) hours as needed for nausea or vomiting. Patient not taking: Reported on 12/25/2014 12/16/14   Devoria Albe, MD  potassium chloride (K-DUR) 10 MEQ tablet Take 2 tablets (20 mEq total) by mouth 2 (two) times daily. 12/16/14   Devoria Albe, MD  traMADol (ULTRAM) 50 MG tablet Take 1-2 tablets (50-100 mg total) by mouth every 6 (six) hours as needed. pain Patient not taking: Reported on 12/15/2014 12/04/14   [name], MD   BP 110/79 mmHg  Pulse 79  Temp(Src) 97.7 F (36.5 C) (Oral)  Resp 18  Ht 5\' 1"  (1.549 m)  Wt 125 lb (56.7 kg)  BMI 23.63 kg/m2  SpO2 100% Physical Exam  Nursing note and vitals reviewed.  35 year old female, resting comfortably and in no acute distress. Vital signs are normal. Oxygen saturation is 100%, which is normal. Head is normocephalic and atraumatic. PERRLA, EOMI. Oropharynx is clear. Neck is nontender and supple without adenopathy or JVD. Back is nontender and there is no CVA tenderness. Lungs are clear without rales, wheezes, or rhonchi. Chest is nontender. Heart has regular rate and rhythm without murmur. Abdomen is soft, flat, with tenderness across the suprapubic area-worse in the midline and on the right than on the left. There are no masses or hepatosplenomegaly and peristalsis is normoactive. Pelvic: Normal external female genitalia. Marked tenderness on insertion of the speculum. Moderate amount of yellowish discharge present in the vaginal vault. Cervix is absent. Marked tenderness on digital exam. Some irregularity is noted on the anterior surface of the vagina but no tear identified. There is no blood present in the vagina. Extremities have no cyanosis or edema, full range of motion is present. Skin is warm and dry without rash. Neurologic: Mental status is normal, cranial nerves are intact, there are no motor or sensory deficits.  ED Course  Procedures (including critical care time) Labs Review Results for orders placed or performed  during the hospital encounter of 07/23/15  Wet prep, genital  Result Value Ref Range   Yeast Wet Prep HPF POC NONE SEEN NONE SEEN   Trich, Wet Prep NONE SEEN NONE SEEN   Clue Cells Wet Prep HPF POC PRESENT NONE SEEN   WBC, Wet Prep HPF POC MANY (A) NONE SEEN   Sperm NONE SEEN   Comprehensive metabolic panel  Result Value Ref Range   Sodium 140 135 - 145 mmol/L   Potassium 4.1 3.5 - 5.1 mmol/L   Chloride 104 101 - 111 mmol/L   CO2 29 22 - 32 mmol/L   Glucose, Bld 89 65 - 99 mg/dL   BUN 7 6 - 20 mg/dL   Creatinine, Ser 1.61 0.44 - 1.00 mg/dL   Calcium 9.3 8.9 - 09.6 mg/dL  Total Protein 7.2 6.5 - 8.1 g/dL   Albumin 3.9 3.5 - 5.0 g/dL   AST 24 15 - 41 U/L   ALT 23 14 - 54 U/L   Alkaline Phosphatase 115 38 - 126 U/L   Total Bilirubin 0.4 0.3 - 1.2 mg/dL   GFR calc non Af Amer >60 >60 mL/min   GFR calc Af Amer >60 >60 mL/min   Anion gap 7 5 - 15  CBC with Differential  Result Value Ref Range   WBC 6.8 4.0 - 10.5 K/uL   RBC 4.08 3.87 - 5.11 MIL/uL   Hemoglobin 13.4 12.0 - 15.0 g/dL   HCT 04.541.1 40.936.0 - 81.146.0 %   MCV 100.7 (H) 78.0 - 100.0 fL   MCH 32.8 26.0 - 34.0 pg   MCHC 32.6 30.0 - 36.0 g/dL   RDW 91.412.4 78.211.5 - 95.615.5 %   Platelets 223 150 - 400 K/uL   Neutrophils Relative % 41 %   Neutro Abs 2.8 1.7 - 7.7 K/uL   Lymphocytes Relative 43 %   Lymphs Abs 3.0 0.7 - 4.0 K/uL   Monocytes Relative 9 %   Monocytes Absolute 0.6 0.1 - 1.0 K/uL   Eosinophils Relative 6 %   Eosinophils Absolute 0.4 0.0 - 0.7 K/uL   Basophils Relative 1 %   Basophils Absolute 0.0 0.0 - 0.1 K/uL  Lipase, blood  Result Value Ref Range   Lipase 19 11 - 51 U/L  Urinalysis, Routine w reflex microscopic  Result Value Ref Range   Color, Urine YELLOW YELLOW   APPearance CLEAR CLEAR   Specific Gravity, Urine 1.020 1.005 - 1.030   pH 6.0 5.0 - 8.0   Glucose, UA NEGATIVE NEGATIVE mg/dL   Hgb urine dipstick TRACE (A) NEGATIVE   Bilirubin Urine NEGATIVE NEGATIVE   Ketones, ur NEGATIVE NEGATIVE mg/dL    Protein, ur NEGATIVE NEGATIVE mg/dL   Nitrite NEGATIVE NEGATIVE   Leukocytes, UA NEGATIVE NEGATIVE  Urine microscopic-add on  Result Value Ref Range   Squamous Epithelial / LPF 0-5 (A) NONE SEEN   WBC, UA 6-30 0 - 5 WBC/hpf   RBC / HPF 0-5 0 - 5 RBC/hpf   Bacteria, UA FEW (A) NONE SEEN   Imaging Review Ct Abdomen Pelvis W Contrast  07/23/2015  CLINICAL DATA:  Clear finger vaginal discharge for 2 days. Active bleeding following intercourse 2 days ago. EXAM: CT ABDOMEN AND PELVIS WITH CONTRAST TECHNIQUE: Multidetector CT imaging of the abdomen and pelvis was performed using the standard protocol following bolus administration of intravenous contrast. CONTRAST:  100mL ISOVUE-300 IOPAMIDOL (ISOVUE-300) INJECTION 61% COMPARISON:  10/30/2014 FINDINGS: The lung bases are clear. Surgical absence of the gallbladder. No bile duct dilatation. The liver, spleen, pancreas, adrenal glands, kidneys, abdominal aorta, inferior vena cava, and retroperitoneal lymph nodes are unremarkable. Stomach, small bowel, and colon are not abnormally distended. No free air or free fluid in the abdomen. Abdominal wall musculature appears intact. Pelvis: The appendix is not identified. Surgical absence of the uterus. Bladder wall is not thickened. No pelvic mass, lymphadenopathy, or fluid collections. Scattered diverticula in the sigmoid colon. No inflammatory changes to suggest diverticulitis. Focal area of narrowing in the sigmoid colon may just represent peristalsis but the same area was narrowed on the prior study suggesting possible stricture. Consider follow-up with endoscopy if clinically indicated. No destructive bone lesions. IMPRESSION: No acute process suggested in the abdomen or pelvis. [place] short segment of the sigmoid colon is narrowed, likely just due to peristalsis  but the same area was narrowed on the previous study and persistent stricture or lesion not excluded. Consider follow-up with endoscopy if clinically indicated.  Electronically Signed   By: Burman Nieves M.D.   On: 07/23/2015 06:08   I have personally reviewed and evaluated these images and lab results as part of my medical decision-making.   MDM   Final diagnoses:  Pelvic pain in female  Bacterial vaginosis    Pelvic pain in patient who is status post hysterectomy and oophorectomy. Old records are reviewed confirming prior hysterectomy and bilateral oophorectomy. She did have a history of endometriosis, but that should be resolving without any estrogen stimulation. Pelvic ultrasound is not likely to be helpful from a diagnostic standpoint, I suspect she will need CT scan for evaluation.  Patient is noted to be exquisitely tender on pelvic exam, but no obvious abnormality other than discharge. CT of abdomen and pelvis is obtained showing no obvious acute process. Mention is made of a short segment of the sigmoid colon being narrowed. I suspect that this would be secondary to adhesions which may account for her ongoing pain. Wet prep has numerous WBCs and clue cells are present. Since she does appear to be symptomatic with her discharge, she is given a prescription for metronidazole. She's given prescription for oxycodone have acetaminophen for pain and ondansetron for nausea. She is referred back to her gynecologist for further workup.  Dione Booze, MD 07/23/15 [phone]

## 2015-07-23 NOTE — ED Notes (Signed)
MD at bedside. 

## 2015-07-23 NOTE — ED Notes (Signed)
Pt ambulatory up to bathroom, spouse by side.

## 2015-07-23 NOTE — ED Notes (Signed)
Pt c/o clear, pinkish vaginal discharge x 2 days; pt states she had intercourse x 2 days ago and after she noticed she was bleeding; pt states she does not have a uterus

## 2015-07-24 LAB — HIV ANTIBODY (ROUTINE TESTING W REFLEX): HIV SCREEN 4TH GENERATION: NONREACTIVE

## 2015-07-24 LAB — GC/CHLAMYDIA PROBE AMP (~~LOC~~) NOT AT ARMC
Chlamydia: NEGATIVE
Neisseria Gonorrhea: NEGATIVE

## 2015-07-24 LAB — RPR: RPR Ser Ql: NONREACTIVE

## 2015-07-25 ENCOUNTER — Encounter: Payer: Self-pay | Admitting: Obstetrics and Gynecology

## 2015-07-25 ENCOUNTER — Ambulatory Visit (INDEPENDENT_AMBULATORY_CARE_PROVIDER_SITE_OTHER): Payer: BLUE CROSS/BLUE SHIELD | Admitting: Obstetrics and Gynecology

## 2015-07-25 VITALS — BP 120/76 | Ht 61.0 in | Wt 123.0 lb

## 2015-07-25 DIAGNOSIS — N898 Other specified noninflammatory disorders of vagina: Secondary | ICD-10-CM

## 2015-07-25 DIAGNOSIS — A6009 Herpesviral infection of other urogenital tract: Secondary | ICD-10-CM | POA: Insufficient documentation

## 2015-07-25 DIAGNOSIS — R102 Pelvic and perineal pain: Secondary | ICD-10-CM

## 2015-07-25 MED ORDER — ACYCLOVIR 400 MG PO TABS: 400.0000 mg | ORAL_TABLET | Freq: Every day | ORAL | Status: DC

## 2015-07-25 MED ORDER — HYDROCODONE-ACETAMINOPHEN 5-325 MG PO TABS: 1.0000 | ORAL_TABLET | Freq: Four times a day (QID) | ORAL | Status: DC | PRN

## 2015-07-25 NOTE — Progress Notes (Signed)
Patient ID: Angel RheaCheri Woods, female   DOB: 01-Feb-1981, 35 y.o.   MRN: [id number] Pt here today for follow up from ED. Pt states that she is having a lot of pain, and she has a lot of discharge as well.

## 2015-07-25 NOTE — Progress Notes (Signed)
Patient ID: Angel RheaCheri Dalporto, female   DOB: 1980-05-04, 35 y.o.   MRN: [id number]    [facility]  @DATE @            Patient name: Angel RheaCheri Hise MRN [id number]  Date of birth: 1980-05-04  CC & HPI:  [name] is a 35 y.o. female complaining of a follow-up of constant, severe, vaginal pain and constant, moderate vaginal discharge that began 4 days ago, with onset after intercourse. Patient was seen at the ED 2 days ago for the same and was referred here. A CT scan of abdomen and pelvis, as well as a UA, obtained at the ED at that time showed no obvious process. A wet prep was positive for WBC's and clue cells, so patient was given metronidazole, as well as as Vicodin and Zofran. This is a new problem.  Patient states she has had been sexually monogamous with one partner for several years. She and her partner have tested negative for HSV in the past. Per medical records, patient had a negative GC/CHL collected 2 days ago.   ROS:  Review of Systems  Genitourinary:       Positive for vaginal discharge and vaginal pain.  All other systems reviewed and are negative.    Pertinent History Reviewed:   Reviewed: Significant for vaginal hysterectomy and bilateral oophorectomy Medical         Past Medical History  Diagnosis Date  . Hypokalemia   . Narcotic abuse     5 yrs ago lortab, off few yrs  . Anxiety   . Chronic pain   . Endometriosis   . Chronic headache   . Chronic back pain   . Chronic pelvic pain in female   . Heart murmur     as a child  . Pneumonia     35 years  old  . Hx of opioid abuse   . Renal disorder Cyst on Kidney                              Surgical Hx:    Past Surgical History  Procedure Laterality Date  . Tonsillectomy    . Cholecystectomy  2010  . Tubal ligation    . Carpal tunnel release      both  . Incision and drainage abscess N/A 07/14/2014    Procedure: INCISION AND DRAINAGE VULVAR ABSCESS;  Surgeon: Tilda BurrowJohn Rozanna Cormany V, MD;  Location: [facility];  Service:  Gynecology;  Laterality: N/A;  . Abdominal hysterectomy  2007    left ovary remains  . Laparoscopic salpingo oopherectomy Left 08/15/2014    Procedure: LAPAROSCOPIC LEFT SALPINGO OOPHORECTOMY;  Surgeon: Tilda BurrowJohn Lailany Enoch V, MD;  Location: [facility];  Service: Gynecology;  Laterality: Left;   Medications: Reviewed & Updated - see associated section                       Current outpatient prescriptions:  .  ALPRAZolam (XANAX) 0.5 MG tablet, Take 1 tablet by mouth 2 (two) times daily., Disp: , Rfl: 0 .  aspirin-acetaminophen-caffeine (EXCEDRIN MIGRAINE) 250-250-65 MG per tablet, Take 2 tablets by mouth every 6 (six) hours as needed for migraine., Disp: , Rfl:  .  diphenhydrAMINE (BENADRYL) 25 MG tablet, Take 25-50 mg by mouth every 6 (six) hours as needed for allergies., Disp: , Rfl:  .  metroNIDAZOLE (FLAGYL) 500 MG tablet, Take 1 tablet (500 mg total) by  mouth 2 (two) times daily., Disp: 14 tablet, Rfl: 0 .  ondansetron (ZOFRAN) 4 MG tablet, Take 1 tablet (4 mg total) by mouth every 6 (six) hours as needed., Disp: 20 tablet, Rfl: 0 .  oxyCODONE-acetaminophen (PERCOCET) 7.5-325 MG tablet, Take 1 tablet by mouth every 4 (four) hours as needed for moderate pain or severe pain., Disp: 20 tablet, Rfl: 0 .  [DISCONTINUED] POTASSIUM PO, Take 1 tablet by mouth daily.  , Disp: , Rfl:    Social History: Reviewed -  reports that she has been smoking Cigarettes.  She has a 18 pack-year smoking history. She has never used smokeless tobacco.  Objective Findings:  Vitals: Blood pressure 120/76, height  (1.549 m), weight 123 lb (55.792 kg).  Physical Examination: General appearance - alert, well appearing, and in no distress, oriented to person, place, and time and normal appearing weight Mental status - alert, oriented to person, place, and time, normal mood, behavior, speech, dress, motor activity, and thought processes, affect appropriate to mood Pelvic - VULVA: small condyloma left labia majora, 5 mm;   several small ulcerations right and left labia minora and on urethra VAGINA: Multiple ulcerations of the vagina, severely tender  Assessment & Plan:   A:  1. 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  2. HSV culture collected and sent.  P:  1. Rx acyclovir. 5x/d F/u 2 wk By signing my name below, I, Ronney Lion, attest that this documentation has been prepared under the direction and in the presence of [name], MD. Electronically Signed: Ronney Lion, ED Scribe. 07/25/2015. 2:16 PM.  I personally performed the services described in this documentation, which was SCRIBED in my presence. The recorded information has been reviewed and considered accurate. It has been edited as necessary during review. [name], MD

## 2015-07-25 NOTE — Patient Instructions (Signed)

## 2015-07-26 ENCOUNTER — Other Ambulatory Visit: Payer: Self-pay | Admitting: *Deleted

## 2015-07-26 MED ORDER — LIDOCAINE 5 % EX OINT: 1.0000 "application " | TOPICAL_OINTMENT | CUTANEOUS | Status: DC | PRN

## 2015-07-27 ENCOUNTER — Ambulatory Visit (INDEPENDENT_AMBULATORY_CARE_PROVIDER_SITE_OTHER): Payer: BLUE CROSS/BLUE SHIELD | Admitting: Obstetrics and Gynecology

## 2015-07-27 VITALS — BP 90/56 | Ht 61.0 in | Wt 122.5 lb

## 2015-07-27 DIAGNOSIS — R102 Pelvic and perineal pain: Secondary | ICD-10-CM

## 2015-07-27 DIAGNOSIS — A6004 Herpesviral vulvovaginitis: Secondary | ICD-10-CM

## 2015-07-27 DIAGNOSIS — B009 Herpesviral infection, unspecified: Secondary | ICD-10-CM | POA: Insufficient documentation

## 2015-07-27 LAB — HERPES SIMPLEX VIRUS CULTURE

## 2015-07-27 MED ORDER — HYDROCODONE-ACETAMINOPHEN 7.5-325 MG PO TABS: 1.0000 | ORAL_TABLET | Freq: Four times a day (QID) | ORAL | Status: DC | PRN

## 2015-07-27 MED ORDER — METRONIDAZOLE 0.75 % VA GEL: 1.0000 | Freq: Every day | VAGINAL | Status: DC

## 2015-07-27 NOTE — Progress Notes (Signed)
Patient ID: [name], female   DOB: [birth year], 35 y.o.   MRN: [id number]    [facility] Visit  @            Patient name: [name] MRN [id number]  Date of birth: Jun 18, 1980  CC & HPI:  [name] is a 35 y.o. female presenting today for f/u of vaginal pain and discharge with onset last week. 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 She was given rx for 5x/d Acyclovir and HSV culture was sent. She reports persistent dysuria with her current symptoms d/t vaginal pain and ulcers. Pt reports she has been applying petroleum jelly to her labia with mild relief of discomfort. She denies any ulcerations to the mouth. Pt states temporary, inadequate pain relief with q4h 5 mg Vicodin. Pt states she finds  Vicodin sedating and has trouble cutting the pills at home. Pt states she discussed the HSV information provided with her partner.    ROS:  Review of Systems  HENT:       -mouth sores  Genitourinary: Positive for dysuria.       +vaginal pain, vaginal discharge   Pertinent History Reviewed:   Reviewed: Significant for abdominal hysterectomy with left salping oophorectomy  Medical         Past Medical History  Diagnosis Date   Hypokalemia    Narcotic abuse     5 yrs ago lortab, off few yrs   Anxiety    Chronic pain    Endometriosis    Chronic headache    Chronic back pain    Chronic pelvic pain in female    Heart murmur     as a child   Pneumonia     58 years  old   Hx of opioid abuse    Renal disorder Cyst on Kidney                              Surgical Hx:    Past Surgical History  Procedure Laterality Date   Tonsillectomy     Cholecystectomy  2010   Tubal ligation     Carpal tunnel release      both   Incision and drainage abscess N/A 07/14/2014    Procedure: INCISION AND DRAINAGE VULVAR ABSCESS;  Surgeon: [name], MD;  Location: [facility];  Service: Gynecology;  Laterality: N/A;   Abdominal  hysterectomy  2007    left ovary remains   Laparoscopic salpingo oopherectomy Left 08/15/2014    Procedure: LAPAROSCOPIC LEFT SALPINGO OOPHORECTOMY;  Surgeon: [name], MD;  Location: [facility];  Service: Gynecology;  Laterality: Left;   Medications: Reviewed & Updated - see associated section                       Current outpatient prescriptions:    acyclovir (ZOVIRAX) 400 MG tablet, Take 1 tablet (400 mg total) by mouth 5 (five) times daily., Disp: 25 tablet, Rfl: 1   ALPRAZolam (XANAX) 0.5 MG tablet, Take 1 tablet by mouth 2 (two) times daily., Disp: , Rfl: 0   aspirin-acetaminophen-caffeine (EXCEDRIN MIGRAINE) 250-250-65 MG per tablet, Take 2 tablets by mouth every 6 (six) hours as needed for migraine., Disp: , Rfl:    diphenhydrAMINE (BENADRYL) 25 MG tablet, Take 25-50 mg by mouth every 6 (six) hours as needed for allergies., Disp: , Rfl:  HYDROcodone-acetaminophen (NORCO/VICODIN) 5-325 MG tablet, Take 1 tablet by mouth every 6 (six) hours as needed., Disp: 30 tablet, Rfl: 0   lidocaine (XYLOCAINE) 5 % ointment, Apply 1 application topically as needed., Disp: 35.44 g, Rfl: 0   metroNIDAZOLE (FLAGYL) 500 MG tablet, Take 1 tablet (500 mg total) by mouth 2 (two) times daily., Disp: 14 tablet, Rfl: 0   ondansetron (ZOFRAN) 4 MG tablet, Take 1 tablet (4 mg total) by mouth every 6 (six) hours as needed., Disp: 20 tablet, Rfl: 0   oxyCODONE-acetaminophen (PERCOCET) 7.5-325 MG tablet, Take 1 tablet by mouth every 4 (four) hours as needed for moderate pain or severe pain., Disp: 20 tablet, Rfl: 0   [DISCONTINUED] POTASSIUM PO, Take 1 tablet by mouth daily.  , Disp: , Rfl:    Social History: Reviewed -  reports that she has been smoking Cigarettes.  She has a 18 pack-year smoking history. She has never used smokeless tobacco.  Objective Findings:  Vitals: Blood pressure 90/56, height 5\' 1"  (1.549 m), weight 122 lb 8 oz (55.566 kg).  Physical Examination: General appearance -  alert, well appearing, and in no distress Abdomen - soft, nontender, nondistended, no masses or organomegaly Pelvic -  VULVA: normal appearing vulva with no masses, tenderness, stable condyloma left labia majora VAGINA: normal appearing vagina with normal color, urethral ulcerations and extensive vaginal ulcerations, felt to represent HSV-2 vs aphthous ulcers  Musculoskeletal - no joint tenderness, deformity or swelling Extremities - peripheral pulses normal, no pedal edema, no clubbing or cyanosis Skin - normal coloration and turgor, no rashes, no suspicious skin lesions noted  Discussed with pt transmission risks of HSV-2 at patients request. At end of discussion, pt had opportunity to ask questions and has no further questions at this time.   Greater than 50% was spent in counseling and coordination of care with the patient. Total time greater than: 15 minutes   Assessment & Plan:   A:  1. HSV culture pending  2. 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 Inadequate pain relief with 5 mg Vicodin   P:  1. Continue acyclovir 5x/day  2. Will  Add Rx Metrogel  3. Increase Vicodin to 7.5 mg prn  4. Discussed potential benefits of 15% tea tree oil applied topically or diluted with petroleum jelly/coconut oil    By signing my name below, I, [name], attest that this documentation has been prepared under the direction and in the presence of Tilda BurrowJohn Ferguson V, MD. Electronically Signed: [name], ED Scribe. 07/27/2015. 11:49 AM.  I personally performed the services described in this documentation, which was SCRIBED in my presence. The recorded information has been reviewed and considered accurate. It has been edited as necessary during review. Tilda BurrowFERGUSON,JOHN V, MD

## 2015-07-27 NOTE — Progress Notes (Signed)
Patient ID: [name], female   DOB: September 22, 1980, 35 y.o.   MRN: [id number] Patient ID: [name], female   DOB: 1980-09-14, 35 y.o.   MRN: [id number]    [facility] Clinic Visit  @            Patient name: [name] MRN [id number]  Date of birth: Feb 24, 1981  CC & HPI:  [name] is a 35 y.o. female presenting today for f/u of vaginal pain and discharge with onset last week. 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 She was given rx for 5x/d Acyclovir and HSV culture was sent. She reports persistent dysuria with her current symptoms d/t vaginal pain and ulcers. Pt reports she has been applying petroleum jelly to her labia with mild relief of discomfort. She denies any ulcerations to the mouth. Pt states temporary, inadequate pain relief with q4h 5 mg Vicodin. Pt states she finds  Vicodin sedating and has trouble cutting the pills at home. Pt states she discussed the HSV information provided with her partner.    ROS:  Review of Systems  HENT:       -mouth sores  Genitourinary: Positive for dysuria.       +vaginal pain, vaginal discharge   Pertinent History Reviewed:   Reviewed: Significant for abdominal hysterectomy with left salping oophorectomy  Medical         Past Medical History  Diagnosis Date  . Hypokalemia   . Narcotic abuse     5 yrs ago lortab, off few yrs  . Anxiety   . Chronic pain   . Endometriosis   . Chronic headache   . Chronic back pain   . Chronic pelvic pain in female   . Heart murmur     as a child  . Pneumonia     26 years  old  . Hx of opioid abuse   . Renal disorder Cyst on Kidney                              Surgical Hx:    Past Surgical History  Procedure Laterality Date  . Tonsillectomy    . Cholecystectomy  2010  . Tubal ligation    . Carpal tunnel release      both  . Incision and drainage abscess N/A 07/14/2014    Procedure: INCISION AND DRAINAGE VULVAR ABSCESS;  Surgeon: [name], MD;   Location: [facility];  Service: Gynecology;  Laterality: N/A;  . Abdominal hysterectomy  2007    left ovary remains  . Laparoscopic salpingo oopherectomy Left 08/15/2014    Procedure: LAPAROSCOPIC LEFT SALPINGO OOPHORECTOMY;  Surgeon: [name], MD;  Location: [facility];  Service: Gynecology;  Laterality: Left;   Medications: Reviewed & Updated - see associated section                       Current outpatient prescriptions:  .  acyclovir (ZOVIRAX) 400 MG tablet, Take 1 tablet (400 mg total) by mouth 5 (five) times daily., Disp: 25 tablet, Rfl: 1 .  ALPRAZolam (XANAX) 0.5 MG tablet, Take 1 tablet by mouth 2 (two) times daily., Disp: , Rfl: 0 .  aspirin-acetaminophen-caffeine (EXCEDRIN MIGRAINE) 250-250-65 MG per tablet, Take 2 tablets by mouth every 6 (six) hours as needed for migraine., Disp: , Rfl:  .  diphenhydrAMINE (BENADRYL) 25 MG tablet, Take  25-50 mg by mouth every 6 (six) hours as needed for allergies., Disp: , Rfl:  .  HYDROcodone-acetaminophen (NORCO/VICODIN) 5-325 MG tablet, Take 1 tablet by mouth every 6 (six) hours as needed., Disp: 30 tablet, Rfl: 0 .  lidocaine (XYLOCAINE) 5 % ointment, Apply 1 application topically as needed., Disp: 35.44 g, Rfl: 0 .  metroNIDAZOLE (FLAGYL) 500 MG tablet, Take 1 tablet (500 mg total) by mouth 2 (two) times daily., Disp: 14 tablet, Rfl: 0 .  ondansetron (ZOFRAN) 4 MG tablet, Take 1 tablet (4 mg total) by mouth every 6 (six) hours as needed., Disp: 20 tablet, Rfl: 0 .  oxyCODONE-acetaminophen (PERCOCET) 7.5-325 MG tablet, Take 1 tablet by mouth every 4 (four) hours as needed for moderate pain or severe pain., Disp: 20 tablet, Rfl: 0 .  [DISCONTINUED] POTASSIUM PO, Take 1 tablet by mouth daily.  , Disp: , Rfl:    Social History: Reviewed -  reports that she has been smoking Cigarettes.  She has a 18 pack-year smoking history. She has never used smokeless tobacco.  Objective Findings:  Vitals: Blood pressure 90/56, height 5\' 1"  (1.549 m), weight 122  lb 8 oz (55.566 kg).  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 At end of discussion, pt had opportunity to ask questions and has no further questions at this time.   Greater than 50% was spent in counseling and coordination of care with the patient. Total time greater than: 15 minutes   Assessment & Plan:   A:  1. HSV culture pending  2. 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 Inadequate pain relief with 5 mg Vicodin   P:  1. Continue acyclovir 5x/day  2. Will  Add Rx Metrogel  3. Increase Vicodin to 7.5 mg prn  4. Discussed potential benefits of 15% tea tree oil applied topically or diluted with petroleum jelly/coconut oil    By signing my name below, I, [name], attest that this documentation has been prepared under the direction and in the presence of Tilda BurrowJohn Ria Redcay V, MD. Electronically Signed: Doreatha MartinEva Woods, ED Scribe. 07/27/2015. 11:49 AM.  I personally performed the services described in this documentation, which was SCRIBED in my presence. The recorded information has been reviewed and considered accurate. It has been edited as necessary during review. Tilda BurrowFERGUSON,Chiquetta Langner V, MD

## 2015-08-02 ENCOUNTER — Ambulatory Visit: Payer: BLUE CROSS/BLUE SHIELD | Admitting: Obstetrics and Gynecology

## 2015-08-02 ENCOUNTER — Encounter: Payer: Self-pay | Admitting: Obstetrics and Gynecology

## 2015-08-02 ENCOUNTER — Ambulatory Visit (INDEPENDENT_AMBULATORY_CARE_PROVIDER_SITE_OTHER): Payer: BLUE CROSS/BLUE SHIELD | Admitting: Obstetrics and Gynecology

## 2015-08-02 VITALS — BP 110/60 | Ht 61.0 in | Wt 122.0 lb

## 2015-08-02 DIAGNOSIS — B009 Herpesviral infection, unspecified: Secondary | ICD-10-CM

## 2015-08-02 DIAGNOSIS — A6009 Herpesviral infection of other urogenital tract: Secondary | ICD-10-CM

## 2015-08-02 DIAGNOSIS — B001 Herpesviral vesicular dermatitis: Secondary | ICD-10-CM | POA: Diagnosis not present

## 2015-08-02 DIAGNOSIS — A630 Anogenital (venereal) warts: Secondary | ICD-10-CM

## 2015-08-02 DIAGNOSIS — A609 Anogenital herpesviral infection, unspecified: Secondary | ICD-10-CM | POA: Diagnosis not present

## 2015-08-02 MED ORDER — HYDROCODONE-ACETAMINOPHEN 5-325 MG PO TABS: 1.0000 | ORAL_TABLET | Freq: Four times a day (QID) | ORAL | Status: DC | PRN

## 2015-08-02 NOTE — Progress Notes (Signed)
Patient ID: [name], female   DOB: 02-02-1981, 35 y.o.   MRN: [id number]    [facility] Clinic Visit  @            Patient name: [name] MRN [id number]  Date of birth: December 10, 1980  CC & HPI:  [name] is a 35 y.o. 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 Pt was seen in the office on 07/25/15 and 07/27/15 for the same complaints. HSV culture on 07/25/15 was positive. On 5/19 she was given rx for Metrogel and her prn Vicodin was increased to 7.5mg  as 5 mg provided her no relief of pain. She has been compliant with 5x/d Acyclovir. Pt reports that her symptoms have been improving since her last office visit. She denies lesions to her mouth.   ROS:  Review of Systems  HENT:       -mouth sores   Genitourinary:       +vaginal and vulvar irritation d/t HSV-2 lesions  All other systems reviewed and are negative.   Pertinent History Reviewed:   Reviewed: Significant for abdominal hysterectomy with left salping oophorectomy  Medical         Past Medical History  Diagnosis Date  . Hypokalemia   . Narcotic abuse     5 yrs ago lortab, off few yrs  . Anxiety   . Chronic pain   . Endometriosis   . Chronic headache   . Chronic back pain   . Chronic pelvic pain in female   . Heart murmur     as a child  . Pneumonia     76 years  old  . Hx of opioid abuse   . Renal disorder Cyst on Kidney                              Surgical Hx:    Past Surgical History  Procedure Laterality Date  . Tonsillectomy    . Cholecystectomy  2010  . Tubal ligation    . Carpal tunnel release      both  . Incision and drainage abscess N/A 07/14/2014    Procedure: INCISION AND DRAINAGE VULVAR ABSCESS;  Surgeon: [name], MD;  Location: [facility];  Service: Gynecology;  Laterality: N/A;  . Abdominal hysterectomy  2007    left ovary remains  . Laparoscopic salpingo oopherectomy Left 08/15/2014    Procedure: LAPAROSCOPIC LEFT SALPINGO  OOPHORECTOMY;  Surgeon: [name], MD;  Location: [facility];  Service: Gynecology;  Laterality: Left;   Medications: Reviewed & Updated - see associated section                       Current outpatient prescriptions:  .  acyclovir (ZOVIRAX) 400 MG tablet, Take 1 tablet (400 mg total) by mouth 5 (five) times daily., Disp: 25 tablet, Rfl: 1 .  ALPRAZolam (XANAX) 0.5 MG tablet, Take 1 tablet by mouth 2 (two) times daily., Disp: , Rfl: 0 .  aspirin-acetaminophen-caffeine (EXCEDRIN MIGRAINE) 250-250-65 MG per tablet, Take 2 tablets by mouth every 6 (six) hours as needed for migraine., Disp: , Rfl:  .  diphenhydrAMINE (BENADRYL) 25 MG tablet, Take 25-50 mg by mouth every 6 (six) hours as needed for allergies., Disp: , Rfl:  .  HYDROcodone-acetaminophen (NORCO) 7.5-325 MG tablet, Take 1 tablet by mouth every 6 (six) hours as needed for moderate pain., Disp:  30 tablet, Rfl: 0 .  lidocaine (XYLOCAINE) 5 % ointment, Apply 1 application topically as needed., Disp: 35.44 g, Rfl: 0 .  metroNIDAZOLE (FLAGYL) 500 MG tablet, Take 1 tablet (500 mg total) by mouth 2 (two) times daily., Disp: 14 tablet, Rfl: 0 .  metroNIDAZOLE (METROGEL) 0.75 % vaginal gel, Place 1 Applicatorful vaginally at bedtime. Apply one applicatorful to vagina at bedtime for 5 days, Disp: 70 g, Rfl: 1 .  ondansetron (ZOFRAN) 4 MG tablet, Take 1 tablet (4 mg total) by mouth every 6 (six) hours as needed., Disp: 20 tablet, Rfl: 0 .  oxyCODONE-acetaminophen (PERCOCET) 7.5-325 MG tablet, Take 1 tablet by mouth every 4 (four) hours as needed for moderate pain or severe pain., Disp: 20 tablet, Rfl: 0 .  [DISCONTINUED] POTASSIUM PO, Take 1 tablet by mouth daily.  , Disp: , Rfl:    Social History: Reviewed -  reports that she has been smoking Cigarettes.  She has a 18 pack-year smoking history. She has never used smokeless tobacco.  Objective Findings:  Vitals: Blood pressure 110/60, height 5\' 1"  (1.549 m), weight 122 lb (55.339 kg).  Physical  Examination: General appearance - alert, well appearing, and in no distress Abdomen - soft, nontender, nondistended, no masses or organomegaly Pelvic - normal external genitalia, cervix, uterus and adnexa, stable condylomas to left labia majora, no additional genital warts seen on exam  Musculoskeletal - no joint tenderness, deformity or swelling Extremities - peripheral pulses normal, no pedal edema, no clubbing or cyanosis Skin - normal coloration and turgor, no rashes, no suspicious skin lesions noted  CONDYLOMA REMOVAL PROCEDURE NOTE The patient's identification was confirmed and consent was obtained. Verbal consent obtained prior to procedure and confirmed by [name].  This procedure was performed by Tilda BurrowJohn V Jonte Shiller at 11:05 AM Site & Appearance: 2 small 5mm condylomas to left labia majora Sterile procedures observed Anesthetic used: Topical EtCl followed by local 1% lidocaine  AgNO3 applied Skin tag excised under local anesthesia, site covered with dry, sterile dressing.  Patient tolerated procedure well without complications. Minimal blood loss. Instructions for care discussed verbally and patient provided with wound care instructions  additional written instructions for homecare and f/u.    Assessment & Plan:   A:  1. HSV-2 culture positive on 07/25/15, recovering nicely 2. 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 Will rx 10 tablets 5 mg Vicodin  2. Acyclovir refills to be provided prn has Rx     By signing my name below, I, [name], attest that this documentation has been prepared under the direction and in the presence of Tilda BurrowJohn Ota Ebersole V, MD. Electronically Signed: [name], ED Scribe. 08/02/2015. 10:59 AM.  I personally performed the services described in this documentation, which was SCRIBED in my presence. The recorded information has been reviewed and considered accurate. It has been edited as necessary during review. Tilda BurrowFERGUSON,Keta Vanvalkenburgh V, MD

## 2015-08-08 ENCOUNTER — Ambulatory Visit: Payer: BLUE CROSS/BLUE SHIELD | Admitting: Obstetrics and Gynecology

## 2015-08-23 ENCOUNTER — Ambulatory Visit: Payer: BLUE CROSS/BLUE SHIELD | Admitting: Obstetrics and Gynecology

## 2015-08-27 ENCOUNTER — Encounter (HOSPITAL_COMMUNITY): Payer: Self-pay | Admitting: *Deleted

## 2015-08-27 ENCOUNTER — Emergency Department (HOSPITAL_COMMUNITY)
Admission: EM | Admit: 2015-08-27 | Discharge: 2015-08-27 | Disposition: A | Discharge status: Home / Self Care | Payer: BLUE CROSS/BLUE SHIELD | Attending: Emergency Medicine | Admitting: Emergency Medicine

## 2015-08-27 DIAGNOSIS — F17210 Nicotine dependence, cigarettes, uncomplicated: Secondary | ICD-10-CM | POA: Insufficient documentation

## 2015-08-27 DIAGNOSIS — R300 Dysuria: Secondary | ICD-10-CM | POA: Diagnosis present

## 2015-08-27 DIAGNOSIS — A6000 Herpesviral infection of urogenital system, unspecified: Secondary | ICD-10-CM | POA: Insufficient documentation

## 2015-08-27 DIAGNOSIS — Z79899 Other long term (current) drug therapy: Secondary | ICD-10-CM | POA: Insufficient documentation

## 2015-08-27 DIAGNOSIS — E876 Hypokalemia: Secondary | ICD-10-CM | POA: Insufficient documentation

## 2015-08-27 LAB — URINALYSIS, ROUTINE W REFLEX MICROSCOPIC
Bilirubin Urine: NEGATIVE
GLUCOSE, UA: NEGATIVE mg/dL
Hgb urine dipstick: NEGATIVE
Ketones, ur: NEGATIVE mg/dL
Leukocytes, UA: NEGATIVE
NITRITE: NEGATIVE
PH: 5.5 (ref 5.0–8.0)
PROTEIN: NEGATIVE mg/dL

## 2015-08-27 LAB — WET PREP, GENITAL
Sperm: NONE SEEN
Trich, Wet Prep: NONE SEEN
WBC WET PREP: NONE SEEN
YEAST WET PREP: NONE SEEN

## 2015-08-27 MED ORDER — OXYCODONE-ACETAMINOPHEN 5-325 MG PO TABS: 1.0000 | ORAL_TABLET | ORAL | Status: DC | PRN

## 2015-08-27 MED ORDER — ACYCLOVIR 400 MG PO TABS: 400.0000 mg | ORAL_TABLET | Freq: Every day | ORAL | Status: DC

## 2015-08-27 MED ORDER — OXYCODONE-ACETAMINOPHEN 5-325 MG PO TABS
1.0000 | ORAL_TABLET | Freq: Once | ORAL | Status: AC
  Administered 2015-08-27: 1 via ORAL
  Filled 2015-08-27: qty 1

## 2015-08-27 NOTE — ED Provider Notes (Signed)
CSN: [id number]     Arrival date & time 08/27/15  0751 History   First MD Initiated Contact with Patient 08/27/15 0805     Chief Complaint  Patient presents with  . Dysuria     (Consider location/radiation/quality/duration/timing/severity/associated sxs/prior Treatment) HPI   [name] is a 35 y.o. female who presents to the Emergency Department complaining of pelvic pain and burning with urination for one day.  She states that she was seen here last month and had a follow-up visit with her OB/GYN and had a positive HSV-2 culture.  She was prescribed Acyclovir with resolution of her symptoms.  She states her symptoms today feels similar to previous.  She reports being in a monogamous relationship with her husband.  She denies fever, abdominal pain, vomiting, vaginal bleeding.  She is s/p hysterectomy in 2007   Past Medical History  Diagnosis Date  . Hypokalemia   . Narcotic abuse     5 yrs ago lortab, off few yrs  . Anxiety   . Chronic pain   . Endometriosis   . Chronic headache   . Chronic back pain   . Chronic pelvic pain in female   . Heart murmur     as a child  . Pneumonia     57 years  old  . Hx of opioid abuse   . Renal disorder Cyst on Kidney   Past Surgical History  Procedure Laterality Date  . Tonsillectomy    . Cholecystectomy  2010  . Tubal ligation    . Carpal tunnel release      both  . Incision and drainage abscess N/A 07/14/2014    Procedure: INCISION AND DRAINAGE VULVAR ABSCESS;  Surgeon: [name], MD;  Location: [facility];  Service: Gynecology;  Laterality: N/A;  . Abdominal hysterectomy  2007    left ovary remains  . Laparoscopic salpingo oopherectomy Left 08/15/2014    Procedure: LAPAROSCOPIC LEFT SALPINGO OOPHORECTOMY;  Surgeon: [name], MD;  Location: [facility];  Service: Gynecology;  Laterality: Left;   Family History  Problem Relation Age of Onset  . Stroke Father   . Diverticulitis Father   . Heart disease    . Arthritis    .  Diabetes    . Kidney disease    . Hypertension Mother   . Diabetes Mother    Social History  Substance Use Topics  . Smoking status: Current Every Day Smoker -- 1.00 packs/day for 18 years    Types: Cigarettes  . Smokeless tobacco: Never Used  . Alcohol Use: No     Comment: rarely   OB History    Gravida Para Term Preterm AB TAB SAB Ectopic Multiple Living   Review of Systems  Constitutional: Negative for fever, chills and appetite change.  HENT: Negative for sore throat.   Gastrointestinal: Negative for nausea, vomiting and abdominal pain.  Genitourinary: Positive for dysuria, genital sores and pelvic pain. Negative for frequency, hematuria, flank pain, vaginal bleeding and vaginal discharge.  Musculoskeletal: Positive for back pain.  Skin: Negative for rash.  Neurological: Negative for weakness.      Allergies  Fentanyl; Divalproex sodium; Fioricet-codeine; Ibuprofen; and Ketorolac tromethamine  Home Medications   Prior to Admission medications   Medication Sig Start Date End Date Taking? Authorizing Provider  acyclovir (ZOVIRAX) 400 MG tablet Take 1 tablet (400 mg total) by mouth 5 (five) times daily. 07/25/15  Tilda BurrowJohn V Ferguson, MD  ALPRAZolam Prudy Feeler(XANAX) 0.5 MG tablet Take 1 tablet by mouth 2 (two) times daily. 11/22/14   Historical Provider, MD  aspirin-acetaminophen-caffeine (EXCEDRIN MIGRAINE) 580-266-8147250-250-65 MG per tablet Take 2 tablets by mouth every 6 (six) hours as needed for migraine.    Historical Provider, MD  diphenhydrAMINE (BENADRYL) 25 MG tablet Take 25-50 mg by mouth every 6 (six) hours as needed for allergies.    Historical Provider, MD  HYDROcodone-acetaminophen (NORCO/VICODIN) 5-325 MG tablet Take 1 tablet by mouth every 6 (six) hours as needed for moderate pain or severe pain. 08/02/15   Tilda BurrowJohn V Ferguson, MD  lidocaine (XYLOCAINE) 5 % ointment Apply 1 application topically as needed. 07/26/15   Tilda BurrowJohn V Ferguson, MD  metroNIDAZOLE (FLAGYL) 500 MG  tablet Take 1 tablet (500 mg total) by mouth 2 (two) times daily. 07/23/15   Dione Boozeavid Glick, MD  metroNIDAZOLE (METROGEL) 0.75 % vaginal gel Place 1 Applicatorful vaginally at bedtime. Apply one applicatorful to vagina at bedtime for 5 days 07/27/15   Tilda BurrowJohn V Ferguson, MD  ondansetron (ZOFRAN) 4 MG tablet Take 1 tablet (4 mg total) by mouth every 6 (six) hours as needed. 07/23/15   Dione Boozeavid Glick, MD  oxyCODONE-acetaminophen (PERCOCET) 7.5-325 MG tablet Take 1 tablet by mouth every 4 (four) hours as needed for moderate pain or severe pain. 07/23/15   Dione Boozeavid Glick, MD   BP 99/71 mmHg  Pulse 50  Temp(Src) 97.6 F (36.4 C) (Oral)  Resp 18  Ht 5\' 1"  (1.549 m)  Wt 54.432 kg  BMI 22.69 kg/m2  SpO2 100% Physical Exam  Constitutional: She is oriented to person, place, and time. She appears well-developed and well-nourished. No distress.  HENT:  Head: Normocephalic and atraumatic.  Neck: Normal range of motion.  Cardiovascular: Normal rate and regular rhythm.   No murmur heard. Pulmonary/Chest: Effort normal and breath sounds normal. No respiratory distress. She has no wheezes. She has no rales.  Abdominal: Soft. Normal appearance. She exhibits no distension and no mass. There is no hepatosplenomegaly. There is no tenderness. There is no rigidity, no rebound, no guarding, no CVA tenderness and no tenderness at McBurney's point.  Genitourinary: There is tenderness in the vagina. No bleeding in the vagina. No foreign body around the vagina. Vaginal discharge found.  Exam chaperoned by nursing staff, Few , small open lesions to the vulva.  No vesicles.  White vagnal discharge w/o obvious lesions.  Cervix, adnexa surgically absent.    Musculoskeletal: Normal range of motion. She exhibits no edema.  Neurological: She is alert and oriented to person, place, and time. Coordination normal.  Skin: Skin is warm and dry. No rash noted.  Psychiatric: She has a normal mood and affect.  Nursing note and vitals  reviewed.   ED Course  Procedures (including critical care time) Labs Review Labs Reviewed  WET PREP, GENITAL - Abnormal; Notable for the following:    Clue Cells Wet Prep HPF POC PRESENT (*)    All other components within normal limits  URINALYSIS, ROUTINE W REFLEX MICROSCOPIC (NOT AT [facility]) - Abnormal; Notable for the following:    Specific Gravity, Urine <1.005 (*)    All other components within normal limits  GC/CHLAMYDIA PROBE AMP ([place]) NOT AT [facility]    Imaging Review No results found. I have personally reviewed and evaluated these images and lab results as part of my medical decision-making.   EKG Interpretation None      MDM   Final diagnoses:  Herpes  genitalia    Pt well appearing, non-toxic.  No concerning sx's for acute abdomen.    Pt medical records reviewed by me, had neg STI workup here on 07/23/15 and confirmed HSV-2 swab at Dr. Rayna Sexton office 2 days later.  Vaginal pain with what appears to be small lesions to the vulva.  Will tx with acyclovir and short course of pain medication with understanding that she will f/u with Dr. Emelda Fear if needed.  Pt agrees to plan and appears stable for d/c    Pauline Aus, PA-C 08/27/15 2154  Zadie Rhine, MD 08/28/15 [phone]

## 2015-08-27 NOTE — ED Notes (Signed)
Pt states she was here several months ago and was told she had Herpes. Yesterday pt began having lower back pain and pain with urination. Pt now states she is having pain in her vaginal with small amounts of discharge.

## 2015-08-27 NOTE — Discharge Instructions (Signed)

## 2015-08-28 LAB — GC/CHLAMYDIA PROBE AMP (~~LOC~~) NOT AT ARMC
CHLAMYDIA, DNA PROBE: NEGATIVE
NEISSERIA GONORRHEA: NEGATIVE

## 2015-08-29 ENCOUNTER — Ambulatory Visit: Payer: BLUE CROSS/BLUE SHIELD | Admitting: Obstetrics and Gynecology

## 2015-08-30 ENCOUNTER — Ambulatory Visit: Payer: BLUE CROSS/BLUE SHIELD | Admitting: Obstetrics and Gynecology

## 2015-08-30 ENCOUNTER — Encounter: Payer: Self-pay | Admitting: Obstetrics and Gynecology

## 2015-08-30 ENCOUNTER — Ambulatory Visit (INDEPENDENT_AMBULATORY_CARE_PROVIDER_SITE_OTHER): Payer: BLUE CROSS/BLUE SHIELD | Admitting: Obstetrics and Gynecology

## 2015-08-30 VITALS — BP 100/60 | Ht 61.0 in | Wt 124.0 lb

## 2015-08-30 DIAGNOSIS — G8929 Other chronic pain: Secondary | ICD-10-CM | POA: Diagnosis not present

## 2015-08-30 DIAGNOSIS — N949 Unspecified condition associated with female genital organs and menstrual cycle: Secondary | ICD-10-CM | POA: Diagnosis not present

## 2015-08-30 DIAGNOSIS — R102 Pelvic and perineal pain: Secondary | ICD-10-CM

## 2015-08-30 DIAGNOSIS — B009 Herpesviral infection, unspecified: Secondary | ICD-10-CM | POA: Diagnosis not present

## 2015-08-30 MED ORDER — HYDROCODONE-ACETAMINOPHEN 10-325 MG PO TABS: 1.0000 | ORAL_TABLET | Freq: Four times a day (QID) | ORAL | Status: DC | PRN

## 2015-08-30 NOTE — Progress Notes (Signed)
Patient ID: [name] [place], female   DOB: 02/20/1981, 35 y.o.   MRN: [id number]    [facility]  @DATE @            Patient name: [name] MRN [id number]  Date of birth: 02/20/1981  CC & HPI:  [name] is a 35 y.o. female presenting today for vaginal irritation and burning onset this week. Pt is HSV2+ on 07/25/15, with most recent office visit for this issue on 08/02/15. Pt states the lesions and pain resolved with Acyclovir prior to recurring this week. She denies additional symptoms.   ROS:  Review of Systems  Genitourinary:       +vaginal burning and irritation  All other systems reviewed and are negative.   Pertinent History Reviewed:   Reviewed: Significant for HSV2 Medical         Past Medical History  Diagnosis Date  . Hypokalemia   . Narcotic abuse     5 yrs ago lortab, off few yrs  . Anxiety   . Chronic pain   . Endometriosis   . Chronic headache   . Chronic back pain   . Chronic pelvic pain in female   . Heart murmur     as a child  . Pneumonia     35 years  old  . Hx of opioid abuse   . 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 Herpes genitalis in women                               Surgical Hx:    Past Surgical History  Procedure Laterality Date  . Tonsillectomy    . Cholecystectomy  2010  . Tubal ligation    . Carpal tunnel release      both  . Incision and drainage abscess N/A 07/14/2014    Procedure: INCISION AND DRAINAGE VULVAR ABSCESS;  Surgeon: Tilda BurrowJohn Roth Ress V, MD;  Location: [facility];  Service: Gynecology;  Laterality: N/A;  . Abdominal hysterectomy  2007    left ovary remains  . Laparoscopic salpingo oopherectomy Left 08/15/2014    Procedure: LAPAROSCOPIC LEFT SALPINGO OOPHORECTOMY;  Surgeon: Tilda BurrowJohn Stefano Trulson V, MD;  Location: [facility];  Service: Gynecology;  Laterality: Left;   Medications: Reviewed & Updated - see associated section                       Current outpatient prescriptions:  .  acyclovir (ZOVIRAX) 400 MG tablet, Take 1 tablet (400 mg  total) by mouth 5 (five) times daily. For 5 days, Disp: 25 tablet, Rfl: 0 .  ALPRAZolam (XANAX) 0.5 MG tablet, Take 1 tablet by mouth 2 (two) times daily., Disp: , Rfl: 0 .  aspirin-acetaminophen-caffeine (EXCEDRIN MIGRAINE) 250-250-65 MG per tablet, Take 2 tablets by mouth every 6 (six) hours as needed for migraine., Disp: , Rfl:  .  diphenhydrAMINE (BENADRYL) 25 MG tablet, Take 25-50 mg by mouth every 6 (six) hours as needed for allergies., Disp: , Rfl:  .  lidocaine (XYLOCAINE) 5 % ointment, Apply 1 application topically as needed., Disp: 35.44 g, Rfl: 0 .  ondansetron (ZOFRAN) 4 MG tablet, Take 1 tablet (4 mg total) by mouth every 6 (six) hours as needed. (Patient not taking: Reported on 08/30/2015), Disp: 20 tablet, Rfl: 0 .  [DISCONTINUED] POTASSIUM PO, Take 1 tablet by mouth daily.  , Disp: , Rfl:    Social History: Reviewed -  reports that she has been smoking Cigarettes.  She has a 18 pack-year smoking history. She has never used smokeless tobacco.  Objective Findings:  Vitals: Blood pressure 100/60, height 5\' 1"  (1.549 m), weight 124 lb (56.246 kg).  Physical Examination: General appearance - alert, well appearing, and in no distress Abdomen - soft, nontender, nondistended, no masses or organomegaly Pelvic -  VULVA: normal appearing vulva with no masses, tenderness or lesions, 3 tiny slowly healing vesicles on the right labia majora, no active herpetic lesions  VAGINA: normal appearing vagina with normal color and discharge, no lesions,  CERVIX: normal appearing cervix without discharge or lesions Musculoskeletal - no joint tenderness, deformity or swelling Extremities - peripheral pulses normal, no pedal edema, no clubbing or cyanosis Skin - normal coloration and turgor, no rashes, no suspicious skin lesions noted   Assessment & Plan:   A:  1. Post-herpetic neuralgia  2   P:  1. Rx Neurontin and 21 tablets vicodin 2. F/u PRN  3 contnue Acyclvir rx;d in ED.    By signing  my name below, I, [name], attest that this documentation has been prepared under the direction and in the presence of Tilda BurrowJohn V Brady Schiller, MD. Electronically Signed: [name], ED Scribe. 08/30/2015. 2:07 PM.  I personally performed the services described in this documentation, which was SCRIBED in my presence. The recorded information has been reviewed and considered accurate. It has been edited as necessary during review. Tilda BurrowFERGUSON,Magdala Brahmbhatt V, MD

## 2015-09-11 ENCOUNTER — Emergency Department (HOSPITAL_COMMUNITY): Payer: BLUE CROSS/BLUE SHIELD

## 2015-09-11 ENCOUNTER — Emergency Department (HOSPITAL_COMMUNITY)
Admission: EM | Admit: 2015-09-11 | Discharge: 2015-09-11 | Discharge status: Against Medical Advice | Payer: BLUE CROSS/BLUE SHIELD | Attending: Emergency Medicine | Admitting: Emergency Medicine

## 2015-09-11 ENCOUNTER — Encounter (HOSPITAL_COMMUNITY): Payer: Self-pay

## 2015-09-11 DIAGNOSIS — Z7289 Other problems related to lifestyle: Secondary | ICD-10-CM | POA: Diagnosis not present

## 2015-09-11 DIAGNOSIS — G8929 Other chronic pain: Secondary | ICD-10-CM | POA: Insufficient documentation

## 2015-09-11 DIAGNOSIS — Y92009 Unspecified place in unspecified non-institutional (private) residence as the place of occurrence of the external cause: Secondary | ICD-10-CM | POA: Insufficient documentation

## 2015-09-11 DIAGNOSIS — Y999 Unspecified external cause status: Secondary | ICD-10-CM | POA: Diagnosis not present

## 2015-09-11 DIAGNOSIS — Z765 Malingerer [conscious simulation]: Secondary | ICD-10-CM

## 2015-09-11 DIAGNOSIS — F17210 Nicotine dependence, cigarettes, uncomplicated: Secondary | ICD-10-CM | POA: Diagnosis not present

## 2015-09-11 DIAGNOSIS — W01198A Fall on same level from slipping, tripping and stumbling with subsequent striking against other object, initial encounter: Secondary | ICD-10-CM | POA: Insufficient documentation

## 2015-09-11 DIAGNOSIS — Z7982 Long term (current) use of aspirin: Secondary | ICD-10-CM | POA: Insufficient documentation

## 2015-09-11 DIAGNOSIS — M545 Low back pain: Secondary | ICD-10-CM | POA: Insufficient documentation

## 2015-09-11 DIAGNOSIS — Y939 Activity, unspecified: Secondary | ICD-10-CM | POA: Insufficient documentation

## 2015-09-11 DIAGNOSIS — M549 Dorsalgia, unspecified: Secondary | ICD-10-CM

## 2015-09-11 HISTORY — DX: Cocaine use, unspecified, uncomplicated: F14.90

## 2015-09-11 MED ORDER — HYDROCODONE-ACETAMINOPHEN 5-325 MG PO TABS: 1.0000 | ORAL_TABLET | Freq: Once | ORAL | Status: DC

## 2015-09-11 MED ORDER — METHOCARBAMOL 500 MG PO TABS
1000.0000 mg | ORAL_TABLET | Freq: Once | ORAL | Status: AC
  Administered 2015-09-11: 1000 mg via ORAL
  Filled 2015-09-11: qty 2

## 2015-09-11 MED ORDER — ACETAMINOPHEN 500 MG PO TABS
1000.0000 mg | ORAL_TABLET | Freq: Once | ORAL | Status: AC
  Administered 2015-09-11: 1000 mg via ORAL
  Filled 2015-09-11: qty 2

## 2015-09-11 NOTE — ED Notes (Signed)
Patient called out stating that she wanted to leave. Patient states, "tylenol isn't going to do shit for me. That doctor is a bitch and they aren't going to manipulate me."   Patient went on to say that she will "serve papers" on that doctor.

## 2015-09-11 NOTE — ED Notes (Signed)
Patient states the pain medication she was given will not help her.  Patient stated she wants to give medication a chance to work [facility].

## 2015-09-11 NOTE — ED Notes (Signed)
Pt apparently fell this evening, states she fell again trying to get up, reports lower back pain 10/10

## 2015-09-11 NOTE — ED Provider Notes (Signed)
CSN: [id number]     Arrival date & time 09/11/15  1929 History   First MD Initiated Contact with Patient 09/11/15 1938     Chief Complaint  Patient presents with  . Back Injury     HPI Pt was seen at 2000. Per pt, c/o gradual onset and persistence of constant acute flair of her chronic low back "pain" that began after she slipped and fell at home. Pt states she "landed on my back."  Denies any change in her usual chronic pain pattern.  Pain worsens with palpation of the area and body position changes. Denies incont/retention of bowel or bladder, no saddle anesthesia, no focal motor weakness, no tingling/numbness in extremities, no fevers, no abd pain.  The symptoms have been associated with no other complaints. The patient has a significant history of similar symptoms previously, recently being evaluated for this complaint and multiple prior evals for same.     Past Medical History  Diagnosis Date  . Hypokalemia   . Narcotic abuse     5 yrs ago lortab, off few yrs  . Anxiety   . Chronic pain   . Endometriosis   . Chronic headache   . Chronic back pain   . Chronic pelvic pain in female   . Heart murmur     as a child  . Pneumonia     35 years  old  . Hx of opioid abuse   . 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 Herpes genitalis in women   . Cocaine use    Past Surgical History  Procedure Laterality Date  . Tonsillectomy    . Cholecystectomy  2010  . Tubal ligation    . Carpal tunnel release      both  . Incision and drainage abscess N/A 07/14/2014    Procedure: INCISION AND DRAINAGE VULVAR ABSCESS;  Surgeon: Tilda BurrowJohn Ferguson V, MD;  Location: [facility];  Service: Gynecology;  Laterality: N/A;  . Abdominal hysterectomy  2007    left ovary remains  . Laparoscopic salpingo oopherectomy Left 08/15/2014    Procedure: LAPAROSCOPIC LEFT SALPINGO OOPHORECTOMY;  Surgeon: Tilda BurrowJohn Ferguson V, MD;  Location: [facility];  Service: Gynecology;  Laterality: Left;   Family History  Problem Relation Age of  Onset  . Stroke Father   . Diverticulitis Father   . Heart disease    . Arthritis    . Diabetes    . Kidney disease    . Hypertension Mother   . Diabetes Mother    Social History  Substance Use Topics  . Smoking status: Current Every Day Smoker -- 1.00 packs/day for 18 years    Types: Cigarettes  . Smokeless tobacco: Never Used  . Alcohol Use: No     Comment: rarely   OB History    Gravida Para Term Preterm AB TAB SAB Ectopic Multiple Living   4 4 4       4      Review of Systems ROS: Statement: All systems negative except as marked or noted in the HPI; Constitutional: Negative for fever and chills. ; ; Eyes: Negative for eye pain, redness and discharge. ; ; ENMT: Negative for ear pain, hoarseness, nasal congestion, sinus pressure and sore throat. ; ; Cardiovascular: Negative for chest pain, palpitations, diaphoresis, dyspnea and peripheral edema. ; ; Respiratory: Negative for cough, wheezing and stridor. ; ; Gastrointestinal: Negative for nausea, vomiting, diarrhea, abdominal pain, blood in stool, hematemesis, jaundice and rectal bleeding. . ; ; Genitourinary: Negative  for dysuria, flank pain and hematuria. ; ; Musculoskeletal: +LBP. Negative for neck pain. Negative for swelling and deformity.; ; Skin: Negative for pruritus, rash, abrasions, blisters, bruising and skin lesion.; ; Neuro: Negative for headache, lightheadedness and neck stiffness. Negative for weakness, altered level of consciousness, altered mental status, extremity weakness, paresthesias, involuntary movement, seizure and syncope.      Allergies  Fentanyl; Divalproex sodium; Fioricet-codeine; Ibuprofen; and Ketorolac tromethamine  Home Medications   Prior to Admission medications   Medication Sig Start Date End Date Taking? Authorizing Provider  acyclovir (ZOVIRAX) 400 MG tablet Take 1 tablet (400 mg total) by mouth 5 (five) times daily. For 5 days 08/27/15   Tammy Triplett, PA-C  ALPRAZolam Prudy Feeler(XANAX) 0.5 MG tablet  Take 1 tablet by mouth 2 (two) times daily. 11/22/14   Historical Provider, MD  aspirin-acetaminophen-caffeine (EXCEDRIN MIGRAINE) [phone] MG per tablet Take 2 tablets by mouth every 6 (six) hours as needed for migraine.    Historical Provider, MD  diphenhydrAMINE (BENADRYL) 25 MG tablet Take 25-50 mg by mouth every 6 (six) hours as needed for allergies.    Historical Provider, MD  HYDROcodone-acetaminophen (NORCO) 10-325 MG tablet Take 1 tablet by mouth every 6 (six) hours as needed. 08/30/15   Tilda BurrowJohn V Ferguson, MD  lidocaine (XYLOCAINE) 5 % ointment Apply 1 application topically as needed. 07/26/15   Tilda BurrowJohn V Ferguson, MD  ondansetron (ZOFRAN) 4 MG tablet Take 1 tablet (4 mg total) by mouth every 6 (six) hours as needed. Patient not taking: Reported on 08/30/2015 07/23/15   Dione Boozeavid Glick, MD   There were no vitals taken for this visit. Physical Exam  2000: Physical examination:  Nursing notes reviewed; Vital signs and O2 SAT reviewed;  Constitutional: Well developed, Well nourished, Well hydrated, In no acute distress; Head:  Normocephalic, atraumatic; Eyes: EOMI, PERRL, No scleral icterus; ENMT: Mouth and pharynx normal, Mucous membranes moist; Neck: Supple, Full range of motion, No lymphadenopathy; Cardiovascular: Regular rate and rhythm, No gallop; Respiratory: Breath sounds clear & equal bilaterally, No wheezes.  Speaking full sentences with ease, Normal respiratory effort/excursion; Chest: Nontender, Movement normal; Abdomen: Soft, Nontender, Nondistended, Normal bowel sounds; Genitourinary: No CVA tenderness; Spine:  No midline CS, TS, LS tenderness. +TTP bilat lumbar paraspinal muscles.;; Extremities: Pulses normal, Pelvis stable. No tenderness, No edema, No calf edema or asymmetry.; Neuro: AA&Ox3, Major CN grossly intact.  Speech clear. No gross focal motor or sensory deficits in extremities.; Skin: Color normal, Warm, Dry.   ED Course  Procedures (including critical care time)  Labs  Review Imaging Review  I have personally reviewed and evaluated these images and lab results as part of my medical decision-making.   EKG Interpretation None      MDM  MDM Reviewed: previous chart, nursing note and vitals      2030:  Pt requesting "something for pain." [place] Controlled Substance Database accessed: pt has received 6 narcotic prescriptions written by 3 different providers in the past 2 months. Pt informed I will treat her pain, but not with narcotic pain medications. Pt then called the ED RN into her room and stated she was going to leave because "tylenol wasn't going to do shit for me and that doctor's a bitch." Pt then walked out of the ED. Concern for drug seeking behavior.   Samuel JesterKathleen Almin Livingstone, DO 09/12/15 1926

## 2015-09-12 ENCOUNTER — Emergency Department (HOSPITAL_COMMUNITY): Payer: BLUE CROSS/BLUE SHIELD

## 2015-09-12 ENCOUNTER — Emergency Department (HOSPITAL_COMMUNITY)
Admission: EM | Admit: 2015-09-12 | Discharge: 2015-09-12 | Disposition: A | Discharge status: Home / Self Care | Payer: BLUE CROSS/BLUE SHIELD | Attending: Emergency Medicine | Admitting: Emergency Medicine

## 2015-09-12 ENCOUNTER — Encounter (HOSPITAL_COMMUNITY): Payer: Self-pay | Admitting: Emergency Medicine

## 2015-09-12 DIAGNOSIS — Z79899 Other long term (current) drug therapy: Secondary | ICD-10-CM | POA: Insufficient documentation

## 2015-09-12 DIAGNOSIS — M545 Low back pain: Secondary | ICD-10-CM | POA: Diagnosis present

## 2015-09-12 DIAGNOSIS — Z7982 Long term (current) use of aspirin: Secondary | ICD-10-CM | POA: Insufficient documentation

## 2015-09-12 DIAGNOSIS — M5442 Lumbago with sciatica, left side: Secondary | ICD-10-CM | POA: Insufficient documentation

## 2015-09-12 DIAGNOSIS — F17210 Nicotine dependence, cigarettes, uncomplicated: Secondary | ICD-10-CM | POA: Insufficient documentation

## 2015-09-12 MED ORDER — OXYCODONE-ACETAMINOPHEN 5-325 MG PO TABS
1.0000 | ORAL_TABLET | Freq: Once | ORAL | Status: AC
  Administered 2015-09-12: 1 via ORAL
  Filled 2015-09-12: qty 1

## 2015-09-12 MED ORDER — CYCLOBENZAPRINE HCL 10 MG PO TABS: 10.0000 mg | ORAL_TABLET | Freq: Three times a day (TID) | ORAL | Status: DC | PRN

## 2015-09-12 MED ORDER — DIAZEPAM 5 MG PO TABS
5.0000 mg | ORAL_TABLET | Freq: Once | ORAL | Status: AC
  Administered 2015-09-12: 5 mg via ORAL
  Filled 2015-09-12: qty 1

## 2015-09-12 MED ORDER — HYDROCODONE-ACETAMINOPHEN 5-325 MG PO TABS: ORAL_TABLET | ORAL | Status: DC

## 2015-09-12 NOTE — ED Notes (Signed)
Patient brought in by EMS with complaint of back pain. States she was seen here yesterday "and all they did was give me a bunch of tylenol and I went home and threw that up." States she has history of back pain.

## 2015-09-12 NOTE — ED Notes (Signed)
T. Triplett, PA at bedside. 

## 2015-09-12 NOTE — ED Notes (Signed)
Pt with lower back pain that radiates down left leg since slipping on water and fell yesterday, pt seen here last night. Pt crying in room.  Pt admits to taking 2 percocets last night that did not help per pt

## 2015-09-12 NOTE — ED Notes (Signed)
Offered an ice pack and pt stated that ice makes it worse

## 2015-09-13 NOTE — ED Provider Notes (Signed)
CSN: [id number]     Arrival date & time 09/12/15  1013 History   First MD Initiated Contact with Patient 09/12/15 1111     Chief Complaint  Patient presents with  . Back Pain     (Consider location/radiation/quality/duration/timing/severity/associated sxs/prior Treatment) HPI   [name] is a 35 y.o. female who presents to the Emergency Department complaining of low back pain for two days  She states she accidentally slipped on 09/11/15 and landed on her back.  She reports increased pain to her lower back.  She was seen here after the fall and states that she was offered Tylenol for pain.  She reports taking 2 percocets that she had left from a previous prescription that did not control her pain.  She describes a sharp pain across her back and into her left leg, but she states the leg pain is chronic.  She denies urine or bowel changes, abd pain, fever, numbness or weakness of the LE's.   Past Medical History  Diagnosis Date  . Hypokalemia   . Narcotic abuse     5 yrs ago lortab, off few yrs  . Anxiety   . Chronic pain   . Endometriosis   . Chronic headache   . Chronic back pain   . Chronic pelvic pain in female   . Heart murmur     as a child  . Pneumonia     35 years  old  . Hx of opioid abuse   . 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 Herpes genitalis in women   . Cocaine use    Past Surgical History  Procedure Laterality Date  . Tonsillectomy    . Cholecystectomy  2010  . Tubal ligation    . Carpal tunnel release      both  . Incision and drainage abscess N/A 07/14/2014    Procedure: INCISION AND DRAINAGE VULVAR ABSCESS;  Surgeon: Tilda BurrowJohn Ferguson V, MD;  Location: [facility];  Service: Gynecology;  Laterality: N/A;  . Abdominal hysterectomy  2007    left ovary remains  . Laparoscopic salpingo oopherectomy Left 08/15/2014    Procedure: LAPAROSCOPIC LEFT SALPINGO OOPHORECTOMY;  Surgeon: Tilda BurrowJohn Ferguson V, MD;  Location: [facility];  Service: Gynecology;  Laterality: Left;   Family History   Problem Relation Age of Onset  . Stroke Father   . Diverticulitis Father   . Heart disease    . Arthritis    . Diabetes    . Kidney disease    . Hypertension Mother   . Diabetes Mother    Social History  Substance Use Topics  . Smoking status: Current Every Day Smoker -- 1.00 packs/day for 18 years    Types: Cigarettes  . Smokeless tobacco: Never Used  . Alcohol Use: No     Comment: rarely   OB History    Gravida Para Term Preterm AB TAB SAB Ectopic Multiple Living   4 4 4       4      Review of Systems  Constitutional: Negative for fever.  Respiratory: Negative for shortness of breath.   Gastrointestinal: Negative for vomiting, abdominal pain and constipation.  Genitourinary: Negative for dysuria, hematuria, flank pain, decreased urine volume and difficulty urinating.  Musculoskeletal: Positive for back pain. Negative for joint swelling.  Skin: Negative for rash.  Neurological: Negative for weakness and numbness.  All other systems reviewed and are negative.     Allergies  Fentanyl; Divalproex sodium; Fioricet-codeine; Ibuprofen; and Ketorolac tromethamine  Home Medications   Prior to Admission medications   Medication Sig Start Date End Date Taking? Authorizing Provider  ALPRAZolam Prudy Feeler(XANAX) 0.5 MG tablet Take 1 tablet by mouth 2 (two) times daily. 11/22/14  Yes Historical Provider, MD  aspirin-acetaminophen-caffeine (EXCEDRIN MIGRAINE) [phone] MG per tablet Take 2 tablets by mouth every 6 (six) hours as needed for migraine.   Yes Historical Provider, MD  diphenhydrAMINE (BENADRYL) 25 MG tablet Take 25-50 mg by mouth every 6 (six) hours as needed for allergies.   Yes Historical Provider, MD  acyclovir (ZOVIRAX) 400 MG tablet Take 1 tablet (400 mg total) by mouth 5 (five) times daily. For 5 days Patient not taking: Reported on 09/12/2015 08/27/15   Awad Gladd, PA-C  cyclobenzaprine (FLEXERIL) 10 MG tablet Take 1 tablet (10 mg total) by mouth 3 (three) times daily as  needed. 09/12/15   Malloree Raboin, PA-C  HYDROcodone-acetaminophen (NORCO/VICODIN) 5-325 MG tablet Take one tab po q 4-6 hrs prn pain 09/12/15   Sausha Raymond, PA-C  lidocaine (XYLOCAINE) 5 % ointment Apply 1 application topically as needed. Patient not taking: Reported on 09/12/2015 07/26/15   Tilda BurrowJohn V Ferguson, MD  ondansetron (ZOFRAN) 4 MG tablet Take 1 tablet (4 mg total) by mouth every 6 (six) hours as needed. Patient not taking: Reported on 08/30/2015 07/23/15   Dione Boozeavid Glick, MD   BP 98/56 mmHg  Pulse 88  Temp(Src) 98 F (36.7 C) (Oral)  Resp 16  Ht 5\' 1"  (1.549 m)  Wt 54.432 kg  BMI 22.69 kg/m2  SpO2 100% Physical Exam  Constitutional: She is oriented to person, place, and time. She appears well-developed and well-nourished. No distress.  HENT:  Head: Normocephalic and atraumatic.  Neck: Normal range of motion. Neck supple.  Cardiovascular: Normal rate, regular rhythm, normal heart sounds and intact distal pulses.   No murmur heard. Pulmonary/Chest: Effort normal and breath sounds normal. No respiratory distress.  Abdominal: Soft. She exhibits no distension. There is no tenderness.  Musculoskeletal: She exhibits tenderness. She exhibits no edema.       Lumbar back: She exhibits tenderness and pain. She exhibits normal range of motion, no swelling, no deformity, no laceration and normal pulse.  Diffuse ttp of the bilateral lumbar paraspinal muscles and left SI joint.  No spinal tenderness.  DP pulses are brisk and symmetrical.  Distal sensation intact.  Pt has 5/5 strength against resistance of bilateral lower extremities.     Neurological: She is alert and oriented to person, place, and time. She has normal strength. No sensory deficit. She exhibits normal muscle tone. Coordination and gait normal.  Reflex Scores:      Patellar reflexes are 2+ on the right side and 2+ on the left side.      Achilles reflexes are 2+ on the right side and 2+ on the left side. Skin: Skin is warm and dry. No  rash noted.  Nursing note and vitals reviewed.   ED Course  Procedures (including critical care time) Labs Review Labs Reviewed - No data to display  Imaging Review Dg Lumbar Spine Complete  09/12/2015  CLINICAL DATA:  Pain following fall EXAM: LUMBAR SPINE - COMPLETE 4+ VIEW COMPARISON:  None. FINDINGS: Frontal, lateral, spot lumbosacral lateral, and bilateral oblique views were obtained. There are 5 non-rib-bearing lumbar type vertebral bodies. There is no fracture or spondylolisthesis. The disc spaces appear normal. There is no appreciable facet arthropathy. IMPRESSION: No fracture or spondylolisthesis.  No appreciable arthropathy. Electronically Signed   By: Chrissie NoaWilliam  Margarita Grizzle III M.D.   On: 09/12/2015 12:02   I have personally reviewed and evaluated these images and lab results as part of my medical decision-making.   EKG Interpretation None      MDM   Final diagnoses:  Bilateral low back pain with left-sided sciatica   Pt is well appearing.  No concerning sx's for emergent neurological process.  Ambulated in the dept with a slow but steady gait.  No foot drop.  Requested referral info for Dr. Ethelene Browns office.       Pauline Aus, PA-C 09/13/15 [id number]  Zadie Rhine, MD 09/13/15 [phone]

## 2015-11-08 ENCOUNTER — Encounter: Payer: Self-pay | Admitting: Obstetrics and Gynecology

## 2015-11-08 ENCOUNTER — Telehealth: Payer: Self-pay | Admitting: Obstetrics and Gynecology

## 2015-11-08 ENCOUNTER — Encounter: Payer: Self-pay | Admitting: *Deleted

## 2015-11-08 ENCOUNTER — Ambulatory Visit (INDEPENDENT_AMBULATORY_CARE_PROVIDER_SITE_OTHER): Payer: BLUE CROSS/BLUE SHIELD | Admitting: Obstetrics and Gynecology

## 2015-11-08 VITALS — BP 104/72 | HR 94 | Ht 61.0 in | Wt 118.6 lb

## 2015-11-08 DIAGNOSIS — A6009 Herpesviral infection of other urogenital tract: Secondary | ICD-10-CM

## 2015-11-08 DIAGNOSIS — A609 Anogenital herpesviral infection, unspecified: Secondary | ICD-10-CM | POA: Diagnosis not present

## 2015-11-08 MED ORDER — HYDROCODONE-ACETAMINOPHEN 5-325 MG PO TABS: 1.0000 | ORAL_TABLET | Freq: Four times a day (QID) | ORAL | 0 refills | Status: DC | PRN

## 2015-11-08 MED ORDER — ACYCLOVIR 400 MG PO TABS: 400.0000 mg | ORAL_TABLET | Freq: Two times a day (BID) | ORAL | 12 refills | Status: DC

## 2015-11-08 NOTE — Progress Notes (Signed)
[facility] Visit  11/08/2015          Patient name: [name] MRN [id number]  Date of birth: 12-06-1980  CC & HPI:  [name] is a 35 y.o. female presenting today for constant, vaginal pain for the past 2-3 days. She has a h/o genital herpes and believes she is having a breakout. She notes she restarted taking acyclovir 2 days ago. She states this would be her third outbreak this year. Pt has also taken Neurontin with past outbreaks.   ROS:  ROS Otherwise negative for acute change except as noted in the HPI.  Pertinent History Reviewed:   Reviewed: Significant for genital herpes  Medical         Past Medical History:  Diagnosis Date  . Anxiety   . Chronic back pain   . Chronic headache   . Chronic pain   . Chronic pelvic pain in female   . Cocaine use   . Endometriosis   . Heart murmur    as a child  . Herpes genitalis in women   . Hx of opioid abuse   . Hypokalemia   . Narcotic abuse    5 yrs ago lortab, off few yrs  . Pneumonia    35 years  old  . Renal disorder Cyst on Kidney                              Surgical Hx:    Past Surgical History:  Procedure Laterality Date  . ABDOMINAL HYSTERECTOMY  2007   left ovary remains  . CARPAL TUNNEL RELEASE     both  . CHOLECYSTECTOMY  2010  . INCISION AND DRAINAGE ABSCESS N/A 07/14/2014   Procedure: INCISION AND DRAINAGE VULVAR ABSCESS;  Surgeon: [name], MD;  Location: [facility];  Service: Gynecology;  Laterality: N/A;  . LAPAROSCOPIC SALPINGO OOPHERECTOMY Left 08/15/2014   Procedure: LAPAROSCOPIC LEFT SALPINGO OOPHORECTOMY;  Surgeon: [name], MD;  Location: [facility];  Service: Gynecology;  Laterality: Left;  . TONSILLECTOMY    . TUBAL LIGATION     Medications: Reviewed & Updated - see associated section                       Current Outpatient Prescriptions:  .  acyclovir (ZOVIRAX) 400 MG tablet, Take 1 tablet (400 mg total) by mouth 5 (five) times daily. For 5 days, Disp: 25 tablet, Rfl: 0 .   ALPRAZolam (XANAX) 0.5 MG tablet, Take 1 tablet by mouth 2 (two) times daily., Disp: , Rfl: 0 .  aspirin-acetaminophen-caffeine (EXCEDRIN MIGRAINE) 250-250-65 MG per tablet, Take 2 tablets by mouth every 6 (six) hours as needed for migraine., Disp: , Rfl:  .  diphenhydrAMINE (BENADRYL) 25 MG tablet, Take 25-50 mg by mouth every 6 (six) hours as needed for allergies., Disp: , Rfl:  .  lidocaine (XYLOCAINE) 5 % ointment, Apply 1 application topically as needed., Disp: 35.44 g, Rfl: 0 .  cyclobenzaprine (FLEXERIL) 10 MG tablet, Take 1 tablet (10 mg total) by mouth 3 (three) times daily as needed. (Patient not taking: Reported on 11/08/2015), Disp: 21 tablet, Rfl: 0 .  HYDROcodone-acetaminophen (NORCO/VICODIN) 5-325 MG tablet, Take one tab po q 4-6 hrs prn pain (Patient not taking: Reported on 11/08/2015), Disp: 8 tablet, Rfl: 0   Social History: Reviewed -  reports that she has been smoking Cigarettes.  She has a 18.00  pack-year smoking history. She has never used smokeless tobacco.  Objective Findings:  Vitals: There were no vitals taken for this visit.  Physical Examination: General appearance - alert, well appearing, and in no distress Mental status - alert, oriented to person, place, and time Pelvic -  VULVA: 3 red bumps to right labia majora   VAGINA: normal appearing vagina with normal color and discharge   Assessment & Plan:   A:  1. Acute herpes outbreak, number 3 in a year   P:  1. Will place on continuous acyclovir suppression 2. Will also discharge with 5 days of hydrocodone      By signing my name below, I, Freida Busmaniana Omoyeni, attest that this documentation has been prepared under the direction and in the presence of Tilda BurrowJohn V Denasia Venn, MD . Electronically Signed: Freida Busmaniana Omoyeni, Scribe. 11/08/2015. 2:52 PM. I personally performed the services described in this documentation, which was SCRIBED in my presence. The recorded information has been reviewed and considered accurate. It has  been edited as necessary during review. Tilda BurrowFERGUSON,Smiley Birr V, MD

## 2015-11-08 NOTE — Telephone Encounter (Signed)
Pt c/o vaginal pain and thinks she is having a break out. Pt given an appt today for evaluation.

## 2015-12-28 IMAGING — CT CT HEAD W/O CM
4 of 5 series · 14 of 47 positions shown, 15 images · non-contrast
Comparison: Head CT 01/15/2013.  Cervical spine CT 02/07/2010.

CLINICAL DATA: 34-year-old female with history of seizure earlier
today complicated by a fall. Large laceration on the forehead.

EXAM:
CT HEAD WITHOUT CONTRAST
CT CERVICAL SPINE WITHOUT CONTRAST
TECHNIQUE: Multidetector CT imaging of the head and cervical spine was
performed following the standard protocol without intravenous
contrast. Multiplanar CT image reconstructions of the cervical spine
were also generated.

[Series 2: headseq 4.8 h37s · axial · 0.41mm/px · z∈[+173,+241]mm · 3 of 30 slices shown, 4 images]
[im 8/30  brain]
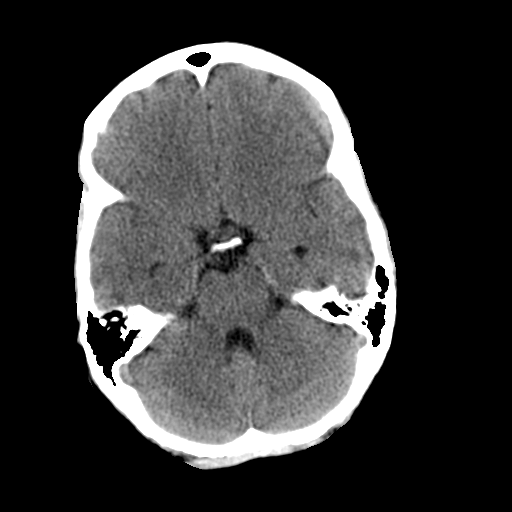
[im 8/30  bone]
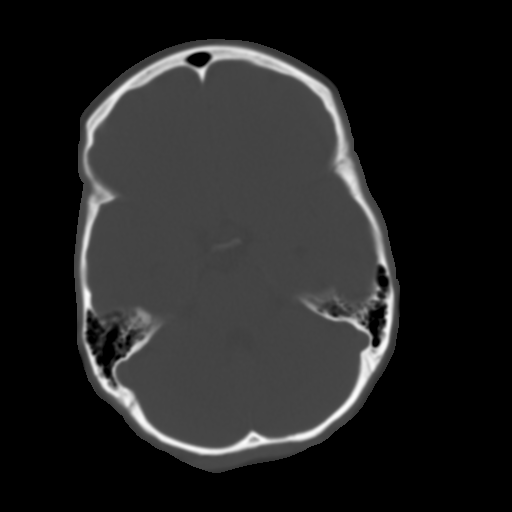
[im 15/30  brain]
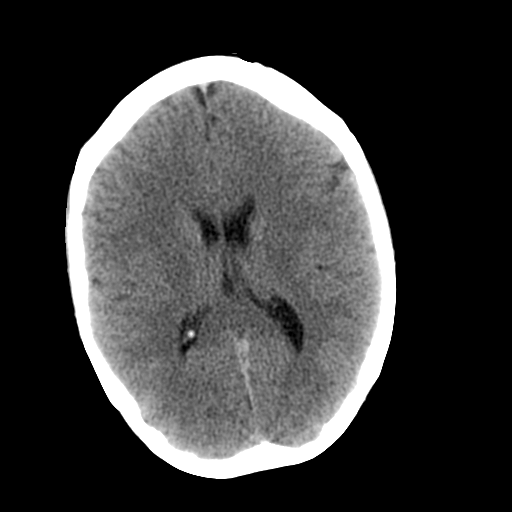
[im 22/30  brain]
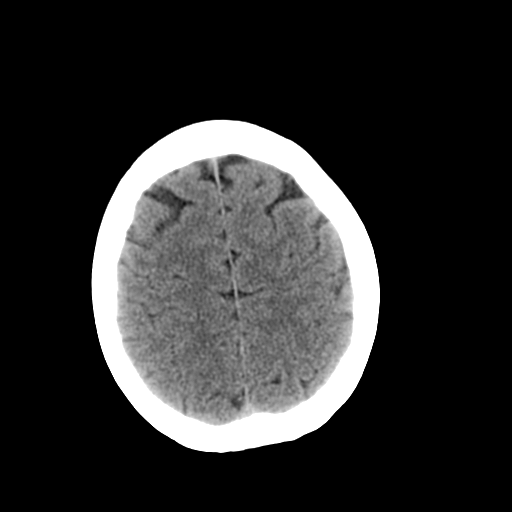

[Series 6: sagittal bone 2.0 · sagittal · 0.19mm/px · 3 of 36 slices shown]
[im 12/36  brain]
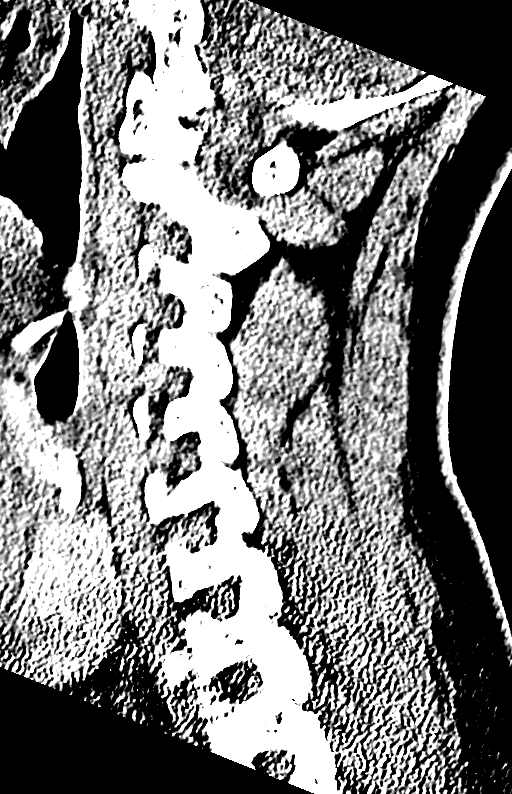
[im 18/36  brain]
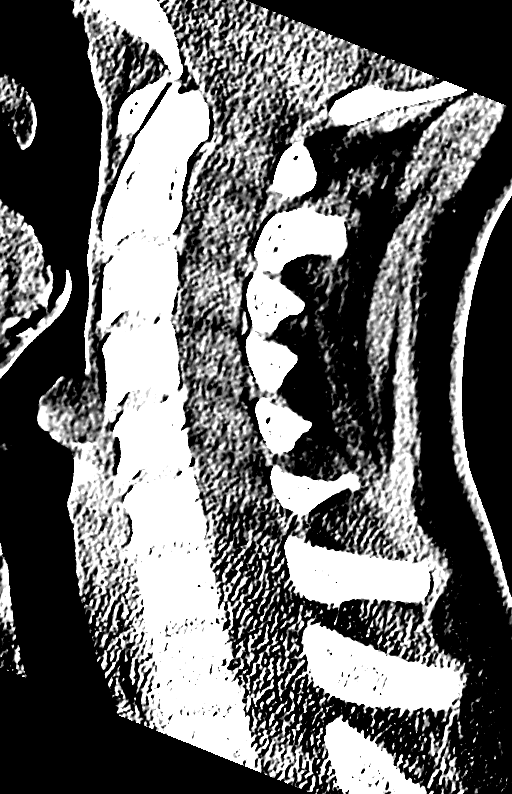
[im 24/36  brain]
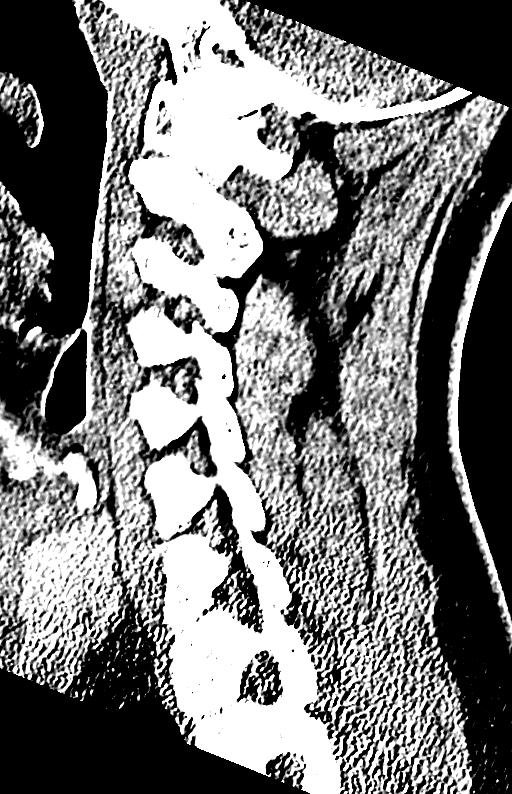

[Series 7: coronal bone 2.0 · coronal · 0.19mm/px · 3 of 51 slices shown]
[im 17/51  brain]
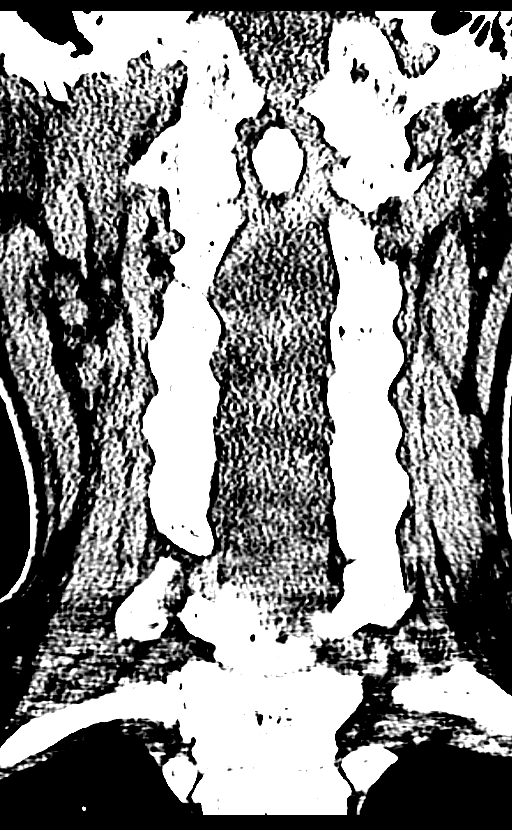
[im 23/51  brain]
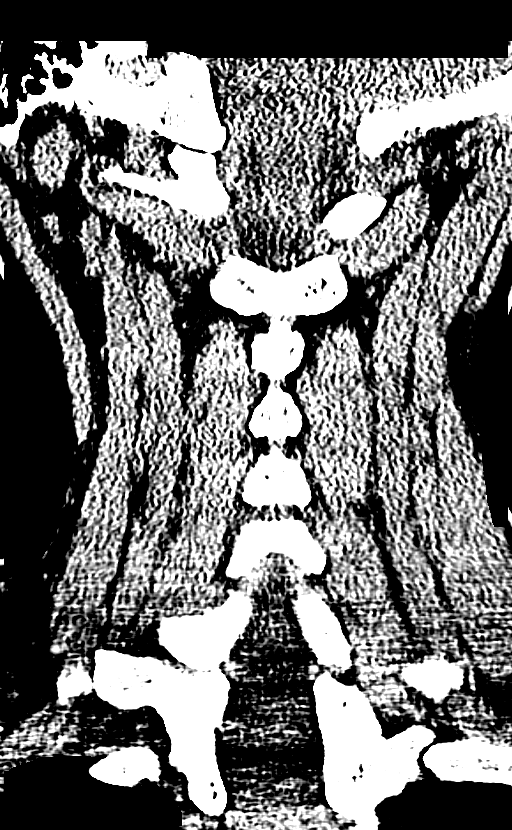
[im 28/51  brain]
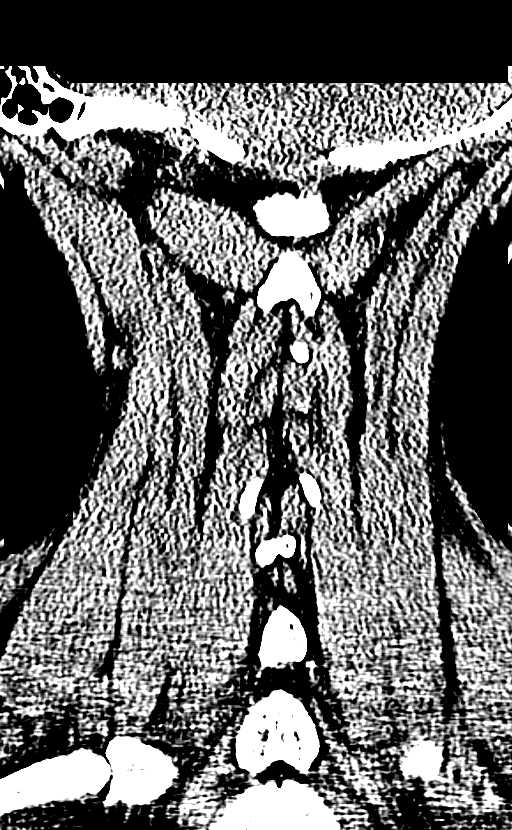

[Series 8: axial bone 2.0 · axial · 0.18mm/px · z∈[-5,+60]mm · 5 of 73 slices shown]
[im 7/73  bone]
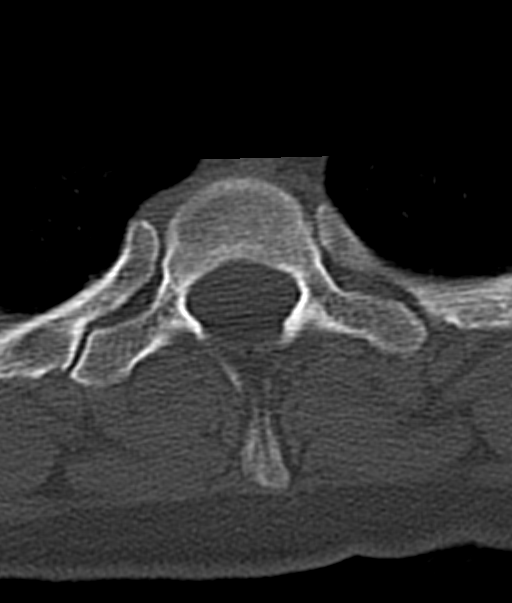
[im 19/73  bone]
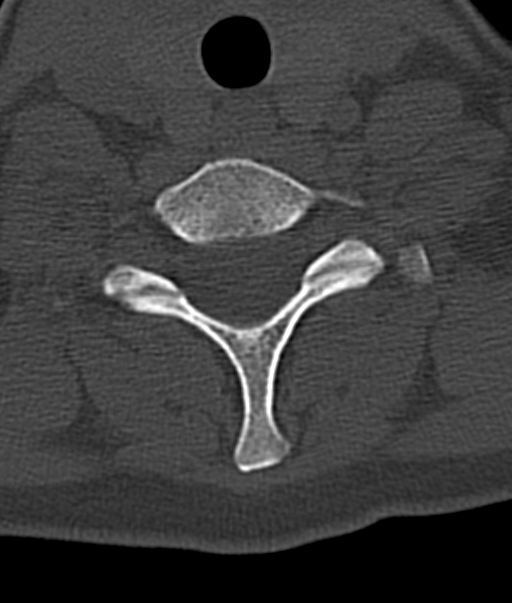
[im 25/73  bone]
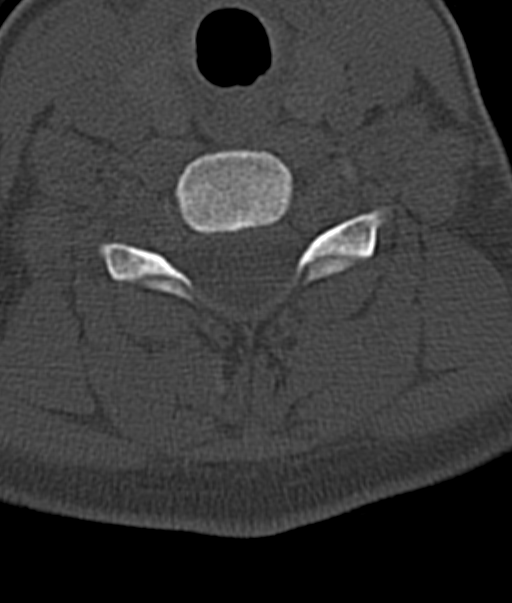
[im 31/73  bone]
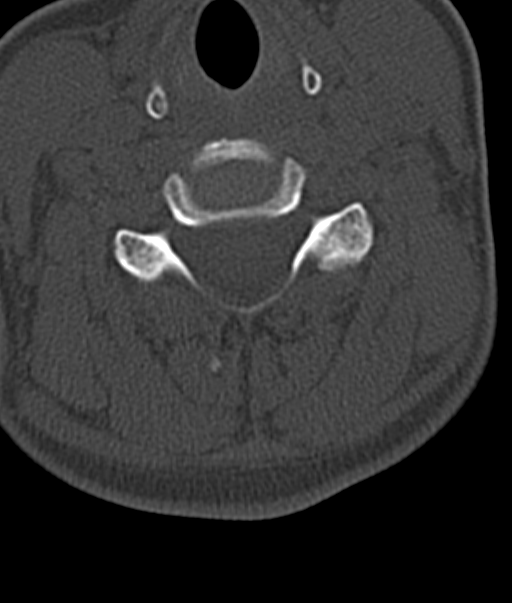
[im 43/73  bone]
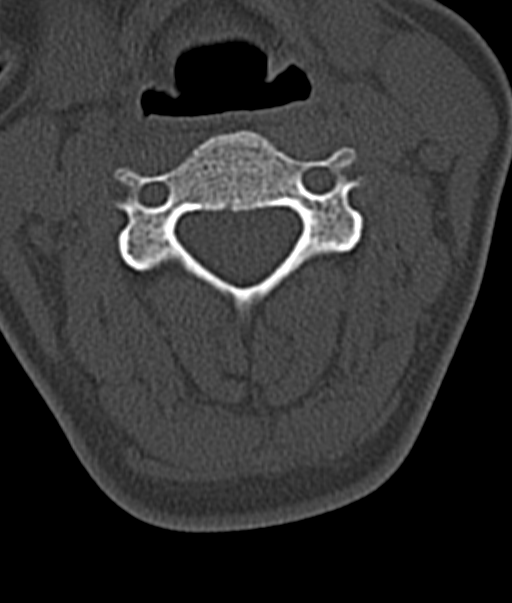

[14 of 47 positions shown; findings below may reference images not displayed]

FINDINGS: CT HEAD FINDINGS

Small amount gas in the left frontal scalp beneath some skin
staples. No underlying displaced skull fracture. No acute
intracranial abnormalities. Specifically, no evidence of acute
intracranial hemorrhage, no definite findings of acute/subacute
cerebral ischemia, no mass, mass effect, hydrocephalus or abnormal
intra or extra-axial fluid collections. Visualized paranasal sinuses
and mastoids are well pneumatized.

CT CERVICAL SPINE FINDINGS

No acute displaced fractures of the cervical spine. Alignment is
anatomic. Prevertebral soft tissues are normal. Visualized portions
of the upper thorax are unremarkable.
IMPRESSION: 1. Small scalp laceration in the left frontal scalp without
underlying displaced skull fracture or findings of significant acute
intracranial trauma.
2. No acute abnormality of the cervical spine.

## 2016-03-14 ENCOUNTER — Telehealth: Payer: Self-pay | Admitting: Obstetrics and Gynecology

## 2016-03-14 NOTE — Telephone Encounter (Signed)
Spoke with patient who states she has a boil on her vagina and wants to be seen today. I informed patient we close at 2 and would not be able to be seen today. She would try to schedule appt on Monday.

## 2016-03-14 NOTE — Telephone Encounter (Signed)
Pt called stating that she has a boil or an ingrown hair and she wanted to be seen today I told her we didn't have any slots and she would like to speak with an nurse. Please contact pt

## 2016-03-17 ENCOUNTER — Ambulatory Visit (INDEPENDENT_AMBULATORY_CARE_PROVIDER_SITE_OTHER): Payer: BLUE CROSS/BLUE SHIELD | Admitting: Obstetrics & Gynecology

## 2016-03-17 ENCOUNTER — Encounter: Payer: Self-pay | Admitting: Obstetrics & Gynecology

## 2016-03-17 VITALS — BP 113/55 | HR 59 | Ht 61.0 in | Wt 118.0 lb

## 2016-03-17 DIAGNOSIS — B009 Herpesviral infection, unspecified: Secondary | ICD-10-CM

## 2016-03-17 MED ORDER — ACYCLOVIR 400 MG PO TABS: 400.0000 mg | ORAL_TABLET | Freq: Every day | ORAL | 2 refills | Status: DC

## 2016-03-17 MED ORDER — HYDROCODONE-ACETAMINOPHEN 5-325 MG PO TABS: 1.0000 | ORAL_TABLET | Freq: Four times a day (QID) | ORAL | 0 refills | Status: DC | PRN

## 2016-03-17 NOTE — Progress Notes (Signed)
Chief Complaint  Patient presents with  . has boil pubic area    Blood pressure (!) 113/55, pulse (!) 59, height 5\' 1"  (1.549 m), weight 118 lb (53.5 kg).  36 y.o. [id number] No LMP recorded. Patient has had a hysterectomy. The current method of family planning is tubal ligation.  Outpatient Encounter Prescriptions as of 03/17/2016  Medication Sig  . acyclovir (ZOVIRAX) 400 MG tablet Take 1 tablet (400 mg total) by mouth 2 (two) times daily. For chronic supression  . ALPRAZolam (XANAX) 0.5 MG tablet Take 1 tablet by mouth 2 (two) times daily.  Marland Kitchen acyclovir (ZOVIRAX) 400 MG tablet Take 1 tablet (400 mg total) by mouth 5 (five) times daily.  . cyclobenzaprine (FLEXERIL) 10 MG tablet Take 1 tablet (10 mg total) by mouth 3 (three) times daily as needed. (Patient not taking: Reported on 11/08/2015)  . HYDROcodone-acetaminophen (NORCO/VICODIN) 5-325 MG tablet Take 1 tablet by mouth every 6 (six) hours as needed.  . [DISCONTINUED] aspirin-acetaminophen-caffeine (EXCEDRIN MIGRAINE) 250-250-65 MG per tablet Take 2 tablets by mouth every 6 (six) hours as needed for migraine.  . [DISCONTINUED] diphenhydrAMINE (BENADRYL) 25 MG tablet Take 25-50 mg by mouth every 6 (six) hours as needed for allergies.  . [DISCONTINUED] HYDROcodone-acetaminophen (NORCO/VICODIN) 5-325 MG tablet Take one tab po q 4-6 hrs prn pain (Patient not taking: Reported on 11/08/2015)  . [DISCONTINUED] HYDROcodone-acetaminophen (NORCO/VICODIN) 5-325 MG tablet Take 1 tablet by mouth every 6 (six) hours as needed.  . [DISCONTINUED] lidocaine (XYLOCAINE) 5 % ointment Apply 1 application topically as needed.   No facility-administered encounter medications on file as of 03/17/2016.     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 Herpes simplex type II infection 054.9 B00.9     Established relevant diagnosis(es):   Plan/Recommendations: Meds ordered this encounter  Medications  . acyclovir (ZOVIRAX) 400 MG tablet    Sig: Take 1 tablet (400 mg total) by mouth 5 (five) times daily.    Dispense:  50 tablet    Refill:  2  . HYDROcodone-acetaminophen (NORCO/VICODIN) 5-325 MG tablet    Sig: Take 1 tablet by mouth every 6 (six) hours as needed.    Dispense:  12 tablet    Refill:  0    Labs or Scans Ordered: No orders of the defined types were placed in this encounter.   Management:: Acyclovir 400 mg 5 times daily  Follow up Return if symptoms worsen or fail to improve, for Follow up.     All questions were answered.  Past Medical History:  Diagnosis Date  . Anxiety   . Chronic back pain   . Chronic headache   . Chronic pain   . Chronic pelvic pain in female   . Cocaine use   . Endometriosis   . Heart murmur    as a child  . Herpes genitalis in women   . Hx of opioid abuse   . Hypokalemia   . Narcotic abuse    5 yrs ago lortab, off few yrs  . Pneumonia    36 years  old  .  Renal disorder Cyst on Kidney    Past Surgical History:  Procedure Laterality Date  . ABDOMINAL HYSTERECTOMY  2007   left ovary remains  . CARPAL TUNNEL RELEASE     both  . CHOLECYSTECTOMY  2010  . INCISION AND DRAINAGE ABSCESS N/A 07/14/2014   Procedure: INCISION AND DRAINAGE VULVAR ABSCESS;  Surgeon: Tilda BurrowJohn Ferguson V, MD;  Location: [facility];  Service: Gynecology;  Laterality: N/A;  . LAPAROSCOPIC SALPINGO OOPHERECTOMY Left 08/15/2014   Procedure: LAPAROSCOPIC LEFT SALPINGO OOPHORECTOMY;  Surgeon: Tilda BurrowJohn Ferguson V, MD;  Location: [facility];  Service: Gynecology;  Laterality: Left;  . TONSILLECTOMY    . TUBAL LIGATION      OB History     Gravida Para Term Preterm AB Living   4 4 4     4    SAB TAB Ectopic Multiple Live Births                  Allergies  Allergen Reactions  . Fentanyl Itching  . Divalproex Sodium Anxiety and Other (See Comments)    'makes me feel really weird' , felt distant from reality  . Fioricet-Codeine [Butalbital-Apap-Caff-Cod] Itching  . Ibuprofen Other (See Comments)    GI upset, stomach pain  . Ketorolac Tromethamine Hives and Other (See Comments)    Stomach pain     Social History   Social History  . Marital status: Married    Spouse name: N/A  . Number of children: 4  . Years of education: N/A   Occupational History  . homemaker    Social History Main Topics  . Smoking status: Current Every Day Smoker    Packs/day: 1.00    Years: 18.00    Types: Cigarettes  . Smokeless tobacco: Never Used  . Alcohol use No     Comment: rarely  . Drug use: No     Comment: no drugs for 3 years 05/2014  . Sexual activity: Yes    Partners: Male    Birth control/ protection: Surgical   Other Topics Concern  . None   Social History Narrative  . None    Family History  Problem Relation Age of Onset  . Stroke Father   . Diverticulitis Father   . Hypertension Mother   . Diabetes Mother   . Heart disease    . Arthritis    . Diabetes    . Kidney disease

## 2016-06-09 ENCOUNTER — Encounter: Payer: Self-pay | Admitting: Women's Health

## 2016-06-09 ENCOUNTER — Ambulatory Visit (INDEPENDENT_AMBULATORY_CARE_PROVIDER_SITE_OTHER): Payer: BLUE CROSS/BLUE SHIELD | Admitting: Women's Health

## 2016-06-09 VITALS — BP 92/54 | HR 68 | Ht 61.0 in | Wt 112.0 lb

## 2016-06-09 DIAGNOSIS — B009 Herpesviral infection, unspecified: Secondary | ICD-10-CM | POA: Diagnosis not present

## 2016-06-09 MED ORDER — VALACYCLOVIR HCL 1 G PO TABS: 1000.0000 mg | ORAL_TABLET | Freq: Every day | ORAL | 11 refills | Status: DC

## 2016-06-09 NOTE — Progress Notes (Signed)
   [facility] Visit  Patient name: [name] MRN [id number]  Date of birth: 1981/01/20  CC & HPI:  [name] is a 36 y.o. [id number] Caucasian female presenting today for report of hsv outbreak that started Friday. Walked up to front desk this am wanting to be seen. States pain is all over vulva. Has been taking acyclovir  BID since Jan, has missed a few. Requesting narcotics, 'only 10'. Has h/o narcotic abuse, polysubstance abuse.  No LMP recorded. Patient has had a hysterectomy. The current method of family planning is status post hysterectomy. Last pap s/p hysterectomy  Pertinent History Reviewed:  Medical & Surgical Hx:   Past medical, surgical, family, and social history reviewed in electronic medical record Medications: Reviewed & Updated - see associated section Allergies: Reviewed in electronic medical record  Objective Findings:  Vitals: BP (!) 92/54 (BP Location: Right Arm, Patient Position: Sitting, Cuff Size: Normal)   Pulse 68   Ht  (1.549 m)   Wt 112 lb (50.8 kg)   BMI 21.16 kg/m  Body mass index is 21.16 kg/m.  Physical Examination: General appearance - alert, well appearing, and in no distress Pelvic - lesions on perineum, vulva tender to touch  No results found for this or any previous visit (from the past 24 hour(s)).   Assessment & Plan:  A:   Recurrent HSV outbreak  H/O narcotic/polysubstance abuse  P:  Stop acyclovir, rx valacyclovir 1g daily for this outbreak, and to continue for suppression since acyclovir not working well/non-compliant w/ bid dosing  Declines request for narcotics, can do cool compresses, apap/ibuprofen  Return in about 1 year (around 06/09/2017) for physical.  Marge Duncans CNM, WHNP-BC 06/09/2016 9:35 AM

## 2016-07-14 ENCOUNTER — Encounter: Payer: Self-pay | Admitting: Obstetrics & Gynecology

## 2016-07-14 ENCOUNTER — Emergency Department (HOSPITAL_COMMUNITY)
Admission: EM | Admit: 2016-07-14 | Discharge: 2016-07-14 | Disposition: A | Discharge status: Home / Self Care | Payer: BLUE CROSS/BLUE SHIELD | Attending: Emergency Medicine | Admitting: Emergency Medicine

## 2016-07-14 ENCOUNTER — Encounter (INDEPENDENT_AMBULATORY_CARE_PROVIDER_SITE_OTHER): Payer: Self-pay

## 2016-07-14 ENCOUNTER — Ambulatory Visit (INDEPENDENT_AMBULATORY_CARE_PROVIDER_SITE_OTHER): Payer: BLUE CROSS/BLUE SHIELD | Admitting: Obstetrics & Gynecology

## 2016-07-14 ENCOUNTER — Encounter (HOSPITAL_COMMUNITY): Payer: Self-pay | Admitting: *Deleted

## 2016-07-14 VITALS — BP 80/60 | HR 54 | Ht 61.0 in | Wt 113.5 lb

## 2016-07-14 DIAGNOSIS — B009 Herpesviral infection, unspecified: Secondary | ICD-10-CM | POA: Diagnosis not present

## 2016-07-14 DIAGNOSIS — L089 Local infection of the skin and subcutaneous tissue, unspecified: Secondary | ICD-10-CM | POA: Diagnosis present

## 2016-07-14 DIAGNOSIS — F17210 Nicotine dependence, cigarettes, uncomplicated: Secondary | ICD-10-CM | POA: Insufficient documentation

## 2016-07-14 DIAGNOSIS — L739 Follicular disorder, unspecified: Secondary | ICD-10-CM | POA: Diagnosis not present

## 2016-07-14 DIAGNOSIS — B029 Zoster without complications: Secondary | ICD-10-CM | POA: Diagnosis not present

## 2016-07-14 DIAGNOSIS — Z79899 Other long term (current) drug therapy: Secondary | ICD-10-CM | POA: Diagnosis not present

## 2016-07-14 MED ORDER — DOXYCYCLINE HYCLATE 100 MG PO TABS
100.0000 mg | ORAL_TABLET | Freq: Once | ORAL | Status: AC
  Administered 2016-07-14: 100 mg via ORAL
  Filled 2016-07-14: qty 1

## 2016-07-14 MED ORDER — VALACYCLOVIR HCL 1 G PO TABS: 1000.0000 mg | ORAL_TABLET | Freq: Three times a day (TID) | ORAL | 0 refills | Status: DC

## 2016-07-14 MED ORDER — HYDROCODONE-ACETAMINOPHEN 5-325 MG PO TABS: 1.0000 | ORAL_TABLET | Freq: Four times a day (QID) | ORAL | 0 refills | Status: DC | PRN

## 2016-07-14 MED ORDER — DOXYCYCLINE HYCLATE 100 MG PO CAPS: 100.0000 mg | ORAL_CAPSULE | Freq: Two times a day (BID) | ORAL | 0 refills | Status: DC

## 2016-07-14 MED ORDER — HYDROCODONE-ACETAMINOPHEN 5-325 MG PO TABS
1.0000 | ORAL_TABLET | Freq: Once | ORAL | Status: AC
  Administered 2016-07-14: 1 via ORAL
  Filled 2016-07-14: qty 1

## 2016-07-14 MED ORDER — ACYCLOVIR 5 % EX CREA: 1.0000 "application " | TOPICAL_CREAM | CUTANEOUS | 2 refills | Status: DC

## 2016-07-14 NOTE — Progress Notes (Signed)
Chief Complaint  Patient presents with  . ? abcess in head    went to [facility] this am    Blood pressure (!) 80/60, pulse (!) 54, height 5\' 1"  (1.549 m), weight 113 lb 8 oz (51.5 kg).  35 y.o. [id number] No LMP recorded. Patient has had a hysterectomy. The current method of family planning is status post hysterectomy.  Outpatient Encounter Prescriptions as of 07/14/2016  Medication Sig  . ALPRAZolam (XANAX) 0.5 MG tablet Take 1 tablet by mouth as needed.   Marland Kitchen. amphetamine-dextroamphetamine (ADDERALL) 20 MG tablet Take 20 mg by mouth daily.  . valACYclovir (VALTREX) 1000 MG tablet Take 1 tablet (1,000 mg total) by mouth daily.  Marland Kitchen. acyclovir cream (ZOVIRAX) 5 % Apply 1 application topically every 3 (three) hours.  Marland Kitchen. doxycycline (VIBRAMYCIN) 100 MG capsule Take 1 capsule (100 mg total) by mouth 2 (two) times daily.  . valACYclovir (VALTREX) 1000 MG tablet Take 1 tablet (1,000 mg total) by mouth 3 (three) times daily.  . [DISCONTINUED] cyclobenzaprine (FLEXERIL) 10 MG tablet Take 1 tablet (10 mg total) by mouth 3 (three) times daily as needed. (Patient not taking: Reported on 11/08/2015)  . [DISCONTINUED] HYDROcodone-acetaminophen (NORCO/VICODIN) 5-325 MG tablet Take 1 tablet by mouth every 6 (six) hours as needed. (Patient not taking: Reported on 06/09/2016)   No facility-administered encounter medications on file as of 07/14/2016.     Subjective Pt with vesicular lesion on right scalp a couple of weeks ago now very painful Vesicular at different stages some crusted Exquisitely tender with lymphadenopathy  Objective In hair line on right vesicular rash with erythema about 5 spots or so consistent with shingles  Pertinent ROS No burning with urination, frequency or urgency No nausea, vomiting or diarrhea Nor fever chills or other constitutional symptoms   Labs or studies Reviewed ED    Impression Diagnoses this Encounter::   ICD-9-CM ICD-10-CM   1. Herpes zoster without  complication 053.9 B02.9     Established relevant diagnosis(es):   Plan/Recommendations: Meds ordered this encounter  Medications  . acyclovir cream (ZOVIRAX) 5 %    Sig: Apply 1 application topically every 3 (three) hours.    Dispense:  15 g    Refill:  2    Labs or Scans Ordered: No orders of the defined types were placed in this encounter.   Management:: Oral and topical acyclovir  Follow up Return in about 10 days (around 07/24/2016) for Follow up, with Dr Despina HiddenEure.        Face to face time:  15 minutes  Greater than 50% of the visit time was spent in counseling and coordination of care with the patient.  The summary and outline of the counseling and care coordination is summarized in the note above.   All questions were answered.  Past Medical History:  Diagnosis Date  . Anxiety   . Chronic back pain   . Chronic headache   . Chronic pain   . Chronic pelvic pain in female   . Cocaine use   . Endometriosis   . Heart murmur    as a child  . Herpes genitalis in women   . Hx of opioid abuse   . Hypokalemia   . Narcotic abuse    5 yrs ago lortab, off few yrs  . Pneumonia    36 years  old  . Renal disorder Cyst on Kidney    Past Surgical History:  Procedure Laterality Date  .  ABDOMINAL HYSTERECTOMY  2007   left ovary remains  . CARPAL TUNNEL RELEASE     both  . CHOLECYSTECTOMY  2010  . INCISION AND DRAINAGE ABSCESS N/A 07/14/2014   Procedure: INCISION AND DRAINAGE VULVAR ABSCESS;  Surgeon: [name], MD;  Location: [facility];  Service: Gynecology;  Laterality: N/A;  . LAPAROSCOPIC SALPINGO OOPHERECTOMY Left 08/15/2014   Procedure: LAPAROSCOPIC LEFT SALPINGO OOPHORECTOMY;  Surgeon: [name], MD;  Location: [facility];  Service: Gynecology;  Laterality: Left;  . TONSILLECTOMY    . TUBAL LIGATION      OB History    Gravida Para Term Preterm AB Living   4 4 4     4    SAB TAB Ectopic Multiple Live Births                  Allergies  Allergen  Reactions  . Fentanyl Itching  . Nsaids Other (See Comments)    GI upset  . Divalproex Sodium Anxiety and Other (See Comments)    'makes me feel really weird' , felt distant from reality  . Fioricet-Codeine [Butalbital-Apap-Caff-Cod] Itching  . Ibuprofen Other (See Comments)    GI upset, stomach pain  . Ketorolac Tromethamine Hives and Other (See Comments)    Stomach pain     Social History   Social History  . Marital status: Married    Spouse name: N/A  . Number of children: 4  . Years of education: N/A   Occupational History  . homemaker    Social History Main Topics  . Smoking status: Current Every Day Smoker    Packs/day: 0.50    Years: 18.00    Types: Cigarettes  . Smokeless tobacco: Former Neurosurgeon    Types: Chew  . Alcohol use No  . Drug use: No     Comment: no drugs for 3 years 05/2014  . Sexual activity: Not Currently    Partners: Male    Birth control/ protection: Surgical     Comment: hyst   Other Topics Concern  . None   Social History Narrative  . None    Family History  Problem Relation Age of Onset  . Stroke Father   . Diverticulitis Father   . Alzheimer's disease Father   . Hypertension Mother   . Diabetes Mother   . Heart disease    . Arthritis    . Diabetes    . Kidney disease    . Heart attack Paternal Grandfather   . Alzheimer's disease Paternal Grandmother   . Heart attack Maternal Grandfather

## 2016-07-14 NOTE — ED Provider Notes (Signed)
[facility] Provider Note   CSN: [id number] Arrival date & time: 07/14/16  0426     History   Chief Complaint Chief Complaint  Patient presents with  . Abscess    HPI [name] is a 35 y.o. female.  Patient with history of anxiety, chronic pain, herpes genitalis presenting with painful lesions to her right posterior scalp for the past 3 weeks. She states she saw her PCP two weeks ago for antibiotic for presumed folliculitis which she finished one week ago. She reports that bumps have been spreading and become more painful. She denies any bleeding or drainage. Reports subjective fever at home. No nausea or vomiting. No chest pain or shortness of breath. She was recently switched from acyclovir to Valtrex once daily for HSV by her gynecologist. She states compliance with this. She came in tonight because the pain is worse and she is concerned that there is a abscess.   The history is provided by the patient.  Abscess  Associated symptoms: no fever, no headaches, no nausea and no vomiting     Past Medical History:  Diagnosis Date  . Anxiety   . Chronic back pain   . Chronic headache   . Chronic pain   . Chronic pelvic pain in female   . Cocaine use   . Endometriosis   . Heart murmur    as a child  . Herpes genitalis in women   . Hx of opioid abuse   . Hypokalemia   . Narcotic abuse    5 yrs ago lortab, off few yrs  . Pneumonia    36 years  old  . Renal disorder Cyst on Kidney    Patient Active Problem List   Diagnosis Date Noted  . Condyloma vulva 08/02/2015  . Herpes simplex type II infection 07/27/2015  . Herpes simplex of female genitalia 07/25/2015  . Depression with anxiety 11/10/2014  . Insomnia 11/10/2014  . Chronic pelvic pain in female 08/15/2014  . Vulvar abscess left labia majora 07/14/2014  . Other and unspecified ovarian cyst 09/16/2013  . Abdominal pain, left lower quadrant 09/16/2013  . LBP (low back pain) 12/16/2012  . Pelvic and  perineal pain 10/25/2012  . Shoulder pain 03/17/2012  . Colitis 01/16/2012  . Abdominal pain 01/16/2012  . Cocaine abuse 09/28/2011  . Dental decay 09/28/2011  . Hormonal disorder 09/28/2011  . Hypokalemia   . Narcotic abuse     Past Surgical History:  Procedure Laterality Date  . ABDOMINAL HYSTERECTOMY  2007   left ovary remains  . CARPAL TUNNEL RELEASE     both  . CHOLECYSTECTOMY  2010  . INCISION AND DRAINAGE ABSCESS N/A 07/14/2014   Procedure: INCISION AND DRAINAGE VULVAR ABSCESS;  Surgeon: [name], MD;  Location: [facility];  Service: Gynecology;  Laterality: N/A;  . LAPAROSCOPIC SALPINGO OOPHERECTOMY Left 08/15/2014   Procedure: LAPAROSCOPIC LEFT SALPINGO OOPHORECTOMY;  Surgeon: [name], MD;  Location: [facility];  Service: Gynecology;  Laterality: Left;  . TONSILLECTOMY    . TUBAL LIGATION      OB History    Gravida Para Term Preterm AB Living   4 4 4     4    SAB TAB Ectopic Multiple Live Births                   Home Medications    Prior to Admission medications   Medication Sig Start Date End Date Taking? Authorizing Provider  acyclovir (ZOVIRAX) 400 MG  tablet Take 1 tablet (400 mg total) by mouth 5 (five) times daily. 03/17/16   Lazaro ArmsEure, Luther H, MD  ALPRAZolam Prudy Feeler(XANAX) 0.5 MG tablet Take 1 tablet by mouth 2 (two) times daily. 11/22/14   [provider]  amphetamine-dextroamphetamine (ADDERALL) 20 MG tablet Take 20 mg by mouth daily.    [provider]  cyclobenzaprine (FLEXERIL) 10 MG tablet Take 1 tablet (10 mg total) by mouth 3 (three) times daily as needed. Patient not taking: Reported on 11/08/2015 09/12/15   Pauline Ausriplett, Tammy, PA-C  HYDROcodone-acetaminophen (NORCO/VICODIN) 5-325 MG tablet Take 1 tablet by mouth every 6 (six) hours as needed. Patient not taking: Reported on 06/09/2016 03/17/16   Lazaro ArmsEure, Luther H, MD  valACYclovir (VALTREX) 1000 MG tablet Take 1 tablet (1,000 mg total) by mouth daily. 06/09/16   Cheral MarkerBooker, Kimberly R, CNM    Family  History Family History  Problem Relation Age of Onset  . Stroke Father   . Diverticulitis Father   . Hypertension Mother   . Diabetes Mother   . Heart disease    . Arthritis    . Diabetes    . Kidney disease      Social History Social History  Substance Use Topics  . Smoking status: Current Every Day Smoker    Packs/day: 0.50    Years: 18.00    Types: Cigarettes  . Smokeless tobacco: Never Used  . Alcohol use No     Comment: rarely     Allergies   Fentanyl; Divalproex sodium; Fioricet-codeine [butalbital-apap-caff-cod]; Ibuprofen; and Ketorolac tromethamine   Review of Systems Review of Systems  Constitutional: Negative for activity change, appetite change and fever.  HENT: Negative for congestion and nosebleeds.   Respiratory: Negative for cough, chest tightness and shortness of breath.   Gastrointestinal: Negative for abdominal pain, nausea and vomiting.  Genitourinary: Negative for dysuria.  Skin: Positive for rash and wound.  Neurological: Negative for dizziness, weakness and headaches.   all other systems are negative except as noted in the HPI and PMH.     Physical Exam Updated Vital Signs BP 103/66 (BP Location: Left Arm)   Pulse 77   Temp 98 F (36.7 C) (Oral)   Resp 18   Ht 5\' 1"  (1.549 m)   Wt 115 lb (52.2 kg)   SpO2 100%   BMI 21.73 kg/m   Physical Exam  Constitutional: She is oriented to person, place, and time. She appears well-developed and well-nourished. No distress.  HENT:  Head: Normocephalic and atraumatic.  Mouth/Throat: Oropharynx is clear and moist. No oropharyngeal exudate.  Eyes: Conjunctivae and EOM are normal. Pupils are equal, round, and reactive to light.  Neck: Normal range of motion. Neck supple.  No meningismus.  Cardiovascular: Normal rate, regular rhythm, normal heart sounds and intact distal pulses.   No murmur heard. Pulmonary/Chest: Effort normal and breath sounds normal. No respiratory distress.  Abdominal: Soft.  There is no tenderness. There is no rebound and no guarding.  Musculoskeletal: Normal range of motion. She exhibits no edema or tenderness.  Neurological: She is alert and oriented to person, place, and time. No cranial nerve deficit. She exhibits normal muscle tone. Coordination normal.  No ataxia on finger to nose bilaterally. No pronator drift. 5/5 strength throughout. CN 2-12 intact.Equal grip strength. Sensation intact.   Skin: Skin is warm. Rash noted.  Right posterior scalp has erythematous scabbed lesions that are tender to palpation. There is no appreciable fluctuance. There is one pustule. All lesions are tender to  palpation and the largest one is crusted over without fluctuance.  Psychiatric: She has a normal mood and affect. Her behavior is normal.  Nursing note and vitals reviewed.    ED Treatments / Results  Labs (all labs ordered are listed, but only abnormal results are displayed) Labs Reviewed - No data to display  EKG  EKG Interpretation None       Radiology No results found.  Procedures Procedures (including critical care time)  Medications Ordered in ED Medications - No data to display   Initial Impression / Assessment and Plan / ED Course  I have reviewed the triage vital signs and the nursing notes.  Pertinent labs & imaging results that were available during my care of the patient were reviewed by me and considered in my medical decision making (see chart for details).     Rash to posterior scalp, suspicious for herpetic lesion. May also have superimposed bacterial infection. No evidence of abscess or fluctuance. Nothing to drain at this time.   Discussed with patient to increase her Valtrex to 3 times daily to treat this as a herpes or zoster outbreak. Will also give antibiotics for possible bacterial component. Follow up with PCP. Return precautions discussed.  BP of 80/60 is not accurate as patient was not in ED at this time.  Final Clinical  Impressions(s) / ED Diagnoses   Final diagnoses:  Folliculitis  Herpes    New Prescriptions New Prescriptions   No medications on file     [name], MD 07/14/16 [phone]

## 2016-07-14 NOTE — ED Notes (Signed)
Pt states understanding of care given and follow up instructions.  Pt angry that Dr Manus Gunningancour will not give more medication for pain.  Pt ambulated from ED with steady gait.

## 2016-07-14 NOTE — ED Triage Notes (Signed)
Pt c/o painful bump to right side of head in hairline; pt states it has been there x 3 weeks and she went to see her PCP and was given antibiotics but the spot is getting bigger and more painful

## 2016-07-14 NOTE — Discharge Instructions (Signed)
As we discussed this may be herpes and it may be bacterial infection. Increase her Valtrex to 1  gram 3 times daily. Take antibiotics as prescribed. Follow-up with your doctor. Return to the ED if you develop new or worsening symptoms.

## 2016-07-24 ENCOUNTER — Ambulatory Visit: Payer: BLUE CROSS/BLUE SHIELD | Admitting: Obstetrics & Gynecology

## 2017-01-20 ENCOUNTER — Other Ambulatory Visit: Payer: Self-pay

## 2017-01-20 ENCOUNTER — Encounter (HOSPITAL_COMMUNITY): Payer: Self-pay | Admitting: *Deleted

## 2017-01-20 ENCOUNTER — Emergency Department (HOSPITAL_COMMUNITY)
Admission: EM | Admit: 2017-01-20 | Discharge: 2017-01-21 | Disposition: A | Discharge status: Home / Self Care | Payer: BLUE CROSS/BLUE SHIELD | Attending: Emergency Medicine | Admitting: Emergency Medicine

## 2017-01-20 DIAGNOSIS — E162 Hypoglycemia, unspecified: Secondary | ICD-10-CM | POA: Insufficient documentation

## 2017-01-20 DIAGNOSIS — T50901A Poisoning by unspecified drugs, medicaments and biological substances, accidental (unintentional), initial encounter: Secondary | ICD-10-CM

## 2017-01-20 DIAGNOSIS — T424X4A Poisoning by benzodiazepines, undetermined, initial encounter: Secondary | ICD-10-CM | POA: Insufficient documentation

## 2017-01-20 DIAGNOSIS — F17210 Nicotine dependence, cigarettes, uncomplicated: Secondary | ICD-10-CM | POA: Insufficient documentation

## 2017-01-20 DIAGNOSIS — R4182 Altered mental status, unspecified: Secondary | ICD-10-CM | POA: Diagnosis present

## 2017-01-20 LAB — COMPREHENSIVE METABOLIC PANEL
ALT: 34 U/L (ref 14–54)
ANION GAP: 12 (ref 5–15)
AST: 24 U/L (ref 15–41)
Albumin: 5.4 g/dL — ABNORMAL HIGH (ref 3.5–5.0)
Alkaline Phosphatase: 108 U/L (ref 38–126)
BUN: 8 mg/dL (ref 6–20)
CHLORIDE: 99 mmol/L — AB (ref 101–111)
CO2: 27 mmol/L (ref 22–32)
Calcium: 9.9 mg/dL (ref 8.9–10.3)
Creatinine, Ser: 0.57 mg/dL (ref 0.44–1.00)
GFR calc Af Amer: >60 mL/min (ref >60)
Glucose, Bld: 89 mg/dL (ref 65–99)
POTASSIUM: 3.1 mmol/L — AB (ref 3.5–5.1)
Sodium: 138 mmol/L (ref 135–145)
TOTAL PROTEIN: 9.1 g/dL — AB (ref 6.5–8.1)
Total Bilirubin: 0.6 mg/dL (ref 0.3–1.2)

## 2017-01-20 LAB — I-STAT BETA HCG BLOOD, ED (MC, WL, AP ONLY): I-stat hCG, quantitative: <5 m[IU]/mL (ref <5)

## 2017-01-20 LAB — ACETAMINOPHEN LEVEL: Acetaminophen (Tylenol), Serum: <10 ug/mL — ABNORMAL LOW (ref 10–30)

## 2017-01-20 LAB — SALICYLATE LEVEL: Salicylate Lvl: <7 mg/dL (ref 2.8–30.0)

## 2017-01-20 LAB — CBC WITH DIFFERENTIAL/PLATELET
BASOS PCT: 0 %
Basophils Absolute: 0 10*3/uL (ref 0.0–0.1)
Eosinophils Absolute: 0.1 10*3/uL (ref 0.0–0.7)
Eosinophils Relative: 0 %
HEMATOCRIT: 43.3 % (ref 36.0–46.0)
HEMOGLOBIN: 14.6 g/dL (ref 12.0–15.0)
LYMPHS ABS: 3.9 10*3/uL (ref 0.7–4.0)
LYMPHS PCT: 29 %
MCH: 33.1 pg (ref 26.0–34.0)
MCHC: 33.7 g/dL (ref 30.0–36.0)
MCV: 98.2 fL (ref 78.0–100.0)
MONO ABS: 0.6 10*3/uL (ref 0.1–1.0)
MONOS PCT: 5 %
NEUTROS ABS: 9.1 10*3/uL — AB (ref 1.7–7.7)
NEUTROS PCT: 66 %
Platelets: 287 10*3/uL (ref 150–400)
RBC: 4.41 MIL/uL (ref 3.87–5.11)
RDW: 12.3 % (ref 11.5–15.5)
WBC: 13.8 10*3/uL — ABNORMAL HIGH (ref 4.0–10.5)

## 2017-01-20 LAB — ETHANOL

## 2017-01-20 LAB — RAPID URINE DRUG SCREEN, HOSP PERFORMED
Amphetamines: POSITIVE — AB
Barbiturates: NOT DETECTED
Benzodiazepines: POSITIVE — AB
COCAINE: NOT DETECTED
OPIATES: NOT DETECTED
TETRAHYDROCANNABINOL: NOT DETECTED

## 2017-01-20 LAB — CBG MONITORING, ED
GLUCOSE-CAPILLARY: 58 mg/dL — AB (ref 65–99)
Glucose-Capillary: 200 mg/dL — ABNORMAL HIGH (ref 65–99)
Glucose-Capillary: 75 mg/dL (ref 65–99)

## 2017-01-20 MED ORDER — DEXTROSE 50 % IV SOLN: 1.0000 | Freq: Once | INTRAVENOUS | Status: DC

## 2017-01-20 MED ORDER — DEXTROSE 50 % IV SOLN
INTRAVENOUS | Status: AC
  Filled 2017-01-20: qty 50

## 2017-01-20 NOTE — ED Triage Notes (Signed)
Pt brought in by [facility] for c/o overdose; pt states she took 7 lorazepam 2mg  and 2-4 percocet; pt's mother called ems when she noticed pt was stumbling down hall; pt denies any SI/HI

## 2017-01-20 NOTE — ED Provider Notes (Signed)
Emergency Department Provider Note   I have reviewed the triage vital signs and the nursing notes.   HISTORY  Chief Complaint Drug Overdose   HPI [name] is a 36 y.o. female, cocaine use, opioid abuse, hypokalemia and renal disorder presents the emergency department today with altered mental status.  Patient filled a prescription for Ativan today at 130 in the bottles missing 7 pills and it is a 2 mg dose.  She also took 2-4 unknown dosage Percocet.  Her mother noticed her stumbling so called EMS.  EMS arrival patient is alert and oriented with normal vital signs however does act confused and has difficulty speaking so brought here for further evaluation.  Patient is not able to provide much history.  Likely secondary to medication overdose.   Past Medical History:  Diagnosis Date  . Anxiety   . Chronic back pain   . Chronic headache   . Chronic pain   . Chronic pelvic pain in female   . Cocaine use   . Endometriosis   . Heart murmur    as a child  . Herpes genitalis in women   . Hx of opioid abuse   . Hypokalemia   . Narcotic abuse (HCC)    5 yrs ago lortab, off few yrs  . Pneumonia    36 years  old  . Renal disorder Cyst on Kidney    Patient Active Problem List   Diagnosis Date Noted  . Condyloma vulva 08/02/2015  . Herpes simplex type II infection 07/27/2015  . Herpes simplex of female genitalia 07/25/2015  . Depression with anxiety 11/10/2014  . Insomnia 11/10/2014  . Chronic pelvic pain in female 08/15/2014  . Vulvar abscess left labia majora 07/14/2014  . Other and unspecified ovarian cyst 09/16/2013  . Abdominal pain, left lower quadrant 09/16/2013  . LBP (low back pain) 12/16/2012  . Pelvic and perineal pain 10/25/2012  . Shoulder pain 03/17/2012  . Colitis 01/16/2012  . Abdominal pain 01/16/2012  . Cocaine abuse (HCC) 09/28/2011  . Dental decay 09/28/2011  . Hormonal disorder 09/28/2011  . Hypokalemia   . Narcotic abuse [facility])     Past  Surgical History:  Procedure Laterality Date  . ABDOMINAL HYSTERECTOMY  2007   left ovary remains  . CARPAL TUNNEL RELEASE     both  . CHOLECYSTECTOMY  2010  . TONSILLECTOMY    . TUBAL LIGATION      Current Outpatient Rx  . Order #: [id number] Class: Historical Med  . Order #: [id number] Class: Historical Med  . Order #: [id number] Class: Normal  . Order #: [id number] Class: Print    Allergies Fentanyl; Nsaids; Divalproex sodium; Fioricet-codeine [butalbital-apap-caff-cod]; Ibuprofen; and Ketorolac tromethamine  Family History  Problem Relation Age of Onset  . Stroke Father   . Diverticulitis Father   . Alzheimer's disease Father   . Hypertension Mother   . Diabetes Mother   . Heart disease Unknown   . Arthritis Unknown   . Diabetes Unknown   . Kidney disease Unknown   . Heart attack Paternal Grandfather   . Alzheimer's disease Paternal Grandmother   . Heart attack Maternal Grandfather     Social History Social History   Tobacco Use  . Smoking status: Current Every Day Smoker    Packs/day: 0.50    Years: 18.00    Pack years: 9.00    Types: Cigarettes  . Smokeless tobacco: Former Neurosurgeon    Types: Chew  Substance Use Topics  . Alcohol use: No  Alcohol/week: 0.0 oz  . Drug use: No    Comment: no drugs for 3 years 05/2014    Review of Systems  All other systems negative except as documented in the HPI. All pertinent positives and negatives as reviewed in the HPI. ____________________________________________   PHYSICAL EXAM:  VITAL SIGNS: ED Triage Vitals [01/20/17 1609]  Enc Vitals Group     BP      Pulse      Resp      Temp      Temp src      SpO2      Weight 112 lb (50.8 kg)     Height 5\' 1"  (1.549 m)     Head Circumference      Peak Flow      Pain Score      Pain Loc      Pain Edu?      Excl. in GC?     Constitutional: Alert and oriented. Well appearing and in no acute distress. Eyes: Conjunctivae are normal. PERRL but does have miosis.  EOMI. Head: Atraumatic. Nose: No congestion/rhinnorhea. Mouth/Throat: Mucous membranes are moist.  Oropharynx non-erythematous. Neck: No stridor.  No meningeal signs.   Cardiovascular: Normal rate, regular rhythm. Good peripheral circulation. Grossly normal heart sounds.   Respiratory: Normal respiratory effort.  No retractions. Lungs CTAB. Gastrointestinal: Soft and nontender. No distention.  Musculoskeletal: No lower extremity tenderness nor edema. No gross deformities of extremities. Neurologic:  Normal speech and language. No gross focal neurologic deficits are appreciated.  Skin:  Skin is warm, dry and intact. No rash noted.   ____________________________________________   LABS (all labs ordered are listed, but only abnormal results are displayed)  Labs Reviewed  COMPREHENSIVE METABOLIC PANEL - Abnormal; Notable for the following components:      Result Value   Potassium 3.1 (*)    Chloride 99 (*)    Total Protein 9.1 (*)    Albumin 5.4 (*)    All other components within normal limits  ACETAMINOPHEN LEVEL - Abnormal; Notable for the following components:   Acetaminophen (Tylenol), Serum <10 (*)    All other components within normal limits  RAPID URINE DRUG SCREEN, HOSP PERFORMED - Abnormal; Notable for the following components:   Benzodiazepines POSITIVE (*)    Amphetamines POSITIVE (*)    All other components within normal limits  CBC WITH DIFFERENTIAL/PLATELET - Abnormal; Notable for the following components:   WBC 13.8 (*)    Neutro Abs 9.1 (*)    All other components within normal limits  CBG MONITORING, ED - Abnormal; Notable for the following components:   Glucose-Capillary 58 (*)    All other components within normal limits  CBG MONITORING, ED - Abnormal; Notable for the following components:   Glucose-Capillary 200 (*)    All other components within normal limits  SALICYLATE LEVEL  ETHANOL  I-STAT BETA HCG BLOOD, ED ([facility], [facility], [facility] ONLY)  CBG MONITORING, ED    ____________________________________________  EKG   EKG Interpretation  Date/Time:  Tuesday January 20 2017 16:17:42 EST Ventricular Rate:  66 PR Interval:    QRS Duration: 72 QT Interval:  383 QTC Calculation: 402 R Axis:   78 Text Interpretation:  Sinus rhythm Short PR interval Probable left atrial enlargement Anteroseptal infarct, age indeterminate No significant change since last tracing in june 2016. Confirmed by Marily MemosMesner, Wendolyn Raso [phone]([id number]) on 01/20/2017 4:19:54 PM       ____________________________________________  RADIOLOGY  No results found.  ____________________________________________  PROCEDURES  Procedure(s) performed:   Procedures   ____________________________________________   INITIAL IMPRESSION / ASSESSMENT AND PLAN / ED COURSE  Pertinent labs & imaging results that were available during my care of the patient were reviewed by me and considered in my medical decision making (see chart for details).  Suspect likely benzodiazepine overdose. Airway protected. Will check for conigestants, ecg and observe.  cbg low, possibly related, d50 given. Recheck at 200.  Repeat evaluation, still very sleepy. Denying suicidality at this point. Still needs to metabolize further. Care transferred pending same.   ____________________________________________  FINAL CLINICAL IMPRESSION(S) / ED DIAGNOSES  Final diagnoses:  Hypoglycemia    MEDICATIONS GIVEN DURING THIS VISIT:  Medications  dextrose 50 % solution 50 mL ( Intravenous Not Given 01/20/17 1645)     NEW OUTPATIENT MEDICATIONS STARTED DURING THIS VISIT:   Note:  This document was prepared using Dragon voice recognition software and may include unintentional dictation errors.   Aldonia Keeven, Barbara CowerJason, MD 01/20/17 2200

## 2017-01-21 NOTE — ED Provider Notes (Signed)
I assumed care in signout to allow pt to wake up She is sleeping but easily arousable She denies complaints She adamantly denies SI She is ambulatory Vitals appropriate Labs reviewed Will discharge with family Advised caution with multiple medications    Zadie RhineWickline, Barb Shear, MD 01/21/17 0221

## 2017-01-22 ENCOUNTER — Ambulatory Visit (HOSPITAL_COMMUNITY)
Admission: RE | Admit: 2017-01-22 | Discharge: 2017-01-22 | Disposition: A | Discharge status: Home / Self Care | Payer: BLUE CROSS/BLUE SHIELD | Source: Ambulatory Visit | Attending: Family Medicine | Admitting: Family Medicine

## 2017-01-22 ENCOUNTER — Other Ambulatory Visit (HOSPITAL_COMMUNITY): Payer: Self-pay | Admitting: Family Medicine

## 2017-01-22 DIAGNOSIS — M5126 Other intervertebral disc displacement, lumbar region: Secondary | ICD-10-CM

## 2017-02-09 ENCOUNTER — Encounter (HOSPITAL_COMMUNITY): Payer: Self-pay | Admitting: *Deleted

## 2017-02-09 ENCOUNTER — Emergency Department (HOSPITAL_COMMUNITY)
Admission: EM | Admit: 2017-02-09 | Discharge: 2017-02-09 | Disposition: A | Discharge status: Home / Self Care | Payer: BLUE CROSS/BLUE SHIELD | Attending: Emergency Medicine | Admitting: Emergency Medicine

## 2017-02-09 ENCOUNTER — Other Ambulatory Visit: Payer: Self-pay

## 2017-02-09 DIAGNOSIS — N899 Noninflammatory disorder of vagina, unspecified: Secondary | ICD-10-CM | POA: Diagnosis present

## 2017-02-09 DIAGNOSIS — Z79899 Other long term (current) drug therapy: Secondary | ICD-10-CM | POA: Insufficient documentation

## 2017-02-09 DIAGNOSIS — N760 Acute vaginitis: Secondary | ICD-10-CM | POA: Diagnosis not present

## 2017-02-09 DIAGNOSIS — F17210 Nicotine dependence, cigarettes, uncomplicated: Secondary | ICD-10-CM | POA: Insufficient documentation

## 2017-02-09 LAB — WET PREP, GENITAL
CLUE CELLS WET PREP: NEGATIVE — AB
Sperm: NONE SEEN
TRICH WET PREP: NEGATIVE — AB
YEAST WET PREP: NEGATIVE — AB

## 2017-02-09 MED ORDER — VALACYCLOVIR HCL 500 MG PO TABS: 500.0000 mg | ORAL_TABLET | Freq: Two times a day (BID) | ORAL | 3 refills | Status: DC

## 2017-02-09 MED ORDER — TRAMADOL HCL 50 MG PO TABS: 50.0000 mg | ORAL_TABLET | Freq: Four times a day (QID) | ORAL | 0 refills | Status: DC | PRN

## 2017-02-09 MED ORDER — VALACYCLOVIR HCL 500 MG PO TABS
500.0000 mg | ORAL_TABLET | Freq: Once | ORAL | Status: AC
  Administered 2017-02-09: 500 mg via ORAL
  Filled 2017-02-09: qty 1

## 2017-02-09 MED ORDER — LIDOCAINE HCL 2 % EX GEL
1.0000 | Freq: Once | CUTANEOUS | Status: AC
Start: 2017-02-09 — End: 2017-02-09
  Administered 2017-02-09: 1
  Filled 2017-02-09: qty 10

## 2017-02-09 MED ORDER — OXYCODONE-ACETAMINOPHEN 5-325 MG PO TABS
1.0000 | ORAL_TABLET | Freq: Once | ORAL | Status: AC
  Administered 2017-02-09: 1 via ORAL
  Filled 2017-02-09: qty 1

## 2017-02-09 MED ORDER — LIDOCAINE 5 % EX OINT: 1.0000 "application " | TOPICAL_OINTMENT | CUTANEOUS | 0 refills | Status: DC | PRN

## 2017-02-09 NOTE — ED Triage Notes (Signed)
Pt c/o vaginal pain; pt states she has been exposed to herpes

## 2017-02-09 NOTE — ED Provider Notes (Signed)
[facility] EMERGENCY DEPARTMENT Provider Note   CSN: [id number] Arrival date & time: 02/09/17  [id number]     History   Chief Complaint Chief Complaint  Patient presents with  . Vaginal Pain    HPI [name] is a 36 y.o. female.  Patient presents to the emergency department for evaluation of vaginal pain.  Patient reports that she has a history of genital herpes.  She started having severe vaginal pain tonight that is similar to outbreaks that she has had in the past.  The entire area of her vagina, mostly inside, is causing her severe pain.  No unusual vaginal discharge, has a history of hysterectomy.  She has not had any dysuria or urinary frequency.      Past Medical History:  Diagnosis Date  . Anxiety   . Chronic back pain   . Chronic headache   . Chronic pain   . Chronic pelvic pain in female   . Cocaine use   . Endometriosis   . Heart murmur    as a child  . Herpes genitalis in women   . Hx of opioid abuse   . Hypokalemia   . Narcotic abuse (HCC)    5 yrs ago lortab, off few yrs  . Pneumonia    36 years  old  . Renal disorder Cyst on Kidney    Patient Active Problem List   Diagnosis Date Noted  . Condyloma vulva 08/02/2015  . Herpes simplex type II infection 07/27/2015  . Herpes simplex of female genitalia 07/25/2015  . Depression with anxiety 11/10/2014  . Insomnia 11/10/2014  . Chronic pelvic pain in female 08/15/2014  . Vulvar abscess left labia majora 07/14/2014  . Other and unspecified ovarian cyst 09/16/2013  . Abdominal pain, left lower quadrant 09/16/2013  . LBP (low back pain) 12/16/2012  . Pelvic and perineal pain 10/25/2012  . Shoulder pain 03/17/2012  . Colitis 01/16/2012  . Abdominal pain 01/16/2012  . Cocaine abuse (HCC) 09/28/2011  . Dental decay 09/28/2011  . Hormonal disorder 09/28/2011  . Hypokalemia   . Narcotic abuse [facility](HCC)     Past Surgical History:  Procedure Laterality Date  . ABDOMINAL HYSTERECTOMY  2007   left ovary  remains  . CARPAL TUNNEL RELEASE     both  . CHOLECYSTECTOMY  2010  . INCISION AND DRAINAGE ABSCESS N/A 07/14/2014   Procedure: INCISION AND DRAINAGE VULVAR ABSCESS;  Surgeon: Tilda BurrowJohn Ferguson V, MD;  Location: [facility];  Service: Gynecology;  Laterality: N/A;  . LAPAROSCOPIC SALPINGO OOPHERECTOMY Left 08/15/2014   Procedure: LAPAROSCOPIC LEFT SALPINGO OOPHORECTOMY;  Surgeon: Tilda BurrowJohn Ferguson V, MD;  Location: [facility];  Service: Gynecology;  Laterality: Left;  . TONSILLECTOMY    . TUBAL LIGATION      OB History    Gravida Para Term Preterm AB Living   4 4 4     4    SAB TAB Ectopic Multiple Live Births                   Home Medications    Prior to Admission medications   Medication Sig Start Date End Date Taking? Authorizing Provider  acyclovir cream (ZOVIRAX) 5 % Apply 1 application topically every 3 (three) hours. 07/14/16   Lazaro ArmsEure, Luther H, MD  amphetamine-dextroamphetamine (ADDERALL) 20 MG tablet Take 10 mg 2 (two) times daily by mouth.     [provider]  lidocaine (XYLOCAINE) 5 % ointment Apply 1 application topically as needed. 02/09/17   Pollina,  Canary Brim, MD  LORazepam (ATIVAN) 2 MG tablet Take 2 mg at bedtime as needed by mouth for anxiety.    [provider]  traMADol (ULTRAM) 50 MG tablet Take 1 tablet (50 mg total) by mouth every 6 (six) hours as needed. 02/09/17   Gilda Crease, MD  valACYclovir (VALTREX) 1000 MG tablet Take 1 tablet (1,000 mg total) by mouth 3 (three) times daily. 07/14/16   Rancour, Jeannett Senior, MD  valACYclovir (VALTREX) 500 MG tablet Take 1 tablet (500 mg total) by mouth 2 (two) times daily. 02/09/17   Gilda Crease, MD    Family History Family History  Problem Relation Age of Onset  . Stroke Father   . Diverticulitis Father   . Alzheimer's disease Father   . Hypertension Mother   . Diabetes Mother   . Heart disease Unknown   . Arthritis Unknown   . Diabetes Unknown   . Kidney disease Unknown   . Heart attack Paternal  Grandfather   . Alzheimer's disease Paternal Grandmother   . Heart attack Maternal Grandfather     Social History Social History   Tobacco Use  . Smoking status: Current Every Day Smoker    Packs/day: 0.50    Years: 18.00    Pack years: 9.00    Types: Cigarettes  . Smokeless tobacco: Former Neurosurgeon    Types: Chew  Substance Use Topics  . Alcohol use: No    Alcohol/week: 0.0 oz  . Drug use: No    Comment: no drugs for 3 years 05/2014     Allergies   Fentanyl; Nsaids; Divalproex sodium; Fioricet-codeine [butalbital-apap-caff-cod]; Ibuprofen; and Ketorolac tromethamine   Review of Systems Review of Systems  Genitourinary: Positive for vaginal pain.  All other systems reviewed and are negative.    Physical Exam Updated Vital Signs BP 109/77 (BP Location: Left Arm)   Pulse 100   Temp 98 F (36.7 C) (Oral)   Resp 18   Ht 5\' 1"  (1.549 m)   Wt 50.8 kg (112 lb)   SpO2 95%   BMI 21.16 kg/m   Physical Exam  Constitutional: She is oriented to person, place, and time. She appears well-developed and well-nourished. No distress.  HENT:  Head: Normocephalic and atraumatic.  Right Ear: Hearing normal.  Left Ear: Hearing normal.  Nose: Nose normal.  Mouth/Throat: Oropharynx is clear and moist and mucous membranes are normal.  Eyes: Conjunctivae and EOM are normal. Pupils are equal, round, and reactive to light.  Neck: Normal range of motion. Neck supple.  Cardiovascular: Regular rhythm, S1 normal and S2 normal. Exam reveals no gallop and no friction rub.  No murmur heard. Pulmonary/Chest: Effort normal and breath sounds normal. No respiratory distress. She exhibits no tenderness.  Abdominal: Soft. Normal appearance and bowel sounds are normal. There is no hepatosplenomegaly. There is no tenderness. There is no rebound, no guarding, no tenderness at McBurney's point and negative Murphy's sign. No hernia.  Genitourinary:  Genitourinary Comments: Generalized slight swelling and  erythema of the vaginal introitus with severe tenderness to light touch, will not tolerate speculum exam, no discrete lesions noted  Musculoskeletal: Normal range of motion.  Neurological: She is alert and oriented to person, place, and time. She has normal strength. No cranial nerve deficit or sensory deficit. Coordination normal. GCS eye subscore is 4. GCS verbal subscore is 5. GCS motor subscore is 6.  Skin: Skin is warm, dry and intact. No rash noted. No cyanosis.  Psychiatric: She has a normal  mood and affect. Her speech is normal and behavior is normal. Thought content normal.  Nursing note and vitals reviewed.    ED Treatments / Results  Labs (all labs ordered are listed, but only abnormal results are displayed) Labs Reviewed  WET PREP, GENITAL - Abnormal; Notable for the following components:      Result Value   Yeast Wet Prep HPF POC NEGATIVE (*)    Trich, Wet Prep NEGATIVE (*)    Clue Cells Wet Prep HPF POC NEGATIVE (*)    WBC, Wet Prep HPF POC RARE (*)    All other components within normal limits    EKG  EKG Interpretation None       Radiology No results found.  Procedures Procedures (including critical care time)  Medications Ordered in ED Medications  oxyCODONE-acetaminophen (PERCOCET/ROXICET) 5-325 MG per tablet 1 tablet (1 tablet Oral Given 02/09/17 0406)  lidocaine (XYLOCAINE) 2 % jelly 1 application (1 application Other Given 02/09/17 0407)  valACYclovir (VALTREX) tablet 500 mg (500 mg Oral Given 02/09/17 0407)     Initial Impression / Assessment and Plan / ED Course  I have reviewed the triage vital signs and the nursing notes.  Pertinent labs & imaging results that were available during my care of the patient were reviewed by me and considered in my medical decision making (see chart for details).     Patient with previous hysterectomy presents with vaginal pain.  Visual examination does not reveal any discrete external lesions, but has generalized  swelling and erythema of the vaginal introitus with severe pain and tenderness to touch.  Treat for suspected herpes outbreak.  Final Clinical Impressions(s) / ED Diagnoses   Final diagnoses:  Vulvovaginitis    ED Discharge Orders        Ordered    valACYclovir (VALTREX) 500 MG tablet  2 times daily     02/09/17 0454    traMADol (ULTRAM) 50 MG tablet  Every 6 hours PRN     02/09/17 0455    lidocaine (XYLOCAINE) 5 % ointment  As needed,   Status:  Discontinued     02/09/17 0455    lidocaine (XYLOCAINE) 5 % ointment  As needed     02/09/17 0502       Gilda CreasePollina, Christopher J, MD 02/09/17 [phone] [id number]

## 2017-02-10 ENCOUNTER — Ambulatory Visit (INDEPENDENT_AMBULATORY_CARE_PROVIDER_SITE_OTHER): Payer: BLUE CROSS/BLUE SHIELD | Admitting: Obstetrics & Gynecology

## 2017-02-10 ENCOUNTER — Encounter: Payer: Self-pay | Admitting: Obstetrics & Gynecology

## 2017-02-10 VITALS — BP 104/64 | HR 82 | Ht 61.0 in | Wt 111.0 lb

## 2017-02-10 DIAGNOSIS — B009 Herpesviral infection, unspecified: Secondary | ICD-10-CM

## 2017-02-10 DIAGNOSIS — A6009 Herpesviral infection of other urogenital tract: Secondary | ICD-10-CM

## 2017-02-10 MED ORDER — OXYCODONE-ACETAMINOPHEN 7.5-325 MG PO TABS: 1.0000 | ORAL_TABLET | Freq: Four times a day (QID) | ORAL | 0 refills | Status: DC | PRN

## 2017-02-10 MED ORDER — VALACYCLOVIR HCL 1 G PO TABS: 1000.0000 mg | ORAL_TABLET | Freq: Three times a day (TID) | ORAL | 11 refills | Status: DC

## 2017-02-10 NOTE — Progress Notes (Signed)
Chief Complaint  Patient presents with  . herpes outbreak    went to [facility] ER Monday am; requests something for pain       36 y.o. [id number] No LMP recorded. Patient has had a hysterectomy. The current method of family planning is status post hysterectomy.  Outpatient Encounter Medications as of 02/10/2017  Medication Sig Note  . acyclovir cream (ZOVIRAX) 5 % Apply 1 application topically every 3 (three) hours.   Marland Kitchen amphetamine-dextroamphetamine (ADDERALL) 20 MG tablet Take 20 mg by mouth daily.  01/20/2017: Filled today but has not picked up via pharmacy records  . lidocaine (XYLOCAINE) 5 % ointment Apply 1 application topically as needed.   Marland Kitchen LORazepam (ATIVAN) 2 MG tablet Take 2 mg at bedtime as needed by mouth for anxiety. 01/20/2017: FIlled today per pharmacy records  . valACYclovir (VALTREX) 1000 MG tablet Take 1 tablet (1,000 mg total) by mouth 3 (three) times daily.   Marland Kitchen oxyCODONE-acetaminophen (PERCOCET) 7.5-325 MG tablet Take 1-2 tablets by mouth every 6 (six) hours as needed.   . valACYclovir (VALTREX) 1000 MG tablet Take 1 tablet (1,000 mg total) by mouth 3 (three) times daily.   . [DISCONTINUED] traMADol (ULTRAM) 50 MG tablet Take 1 tablet (50 mg total) by mouth every 6 (six) hours as needed.   . [DISCONTINUED] valACYclovir (VALTREX) 500 MG tablet Take 1 tablet (500 mg total) by mouth 2 (two) times daily.    No facility-administered encounter medications on file as of 02/10/2017.     Subjective Pt with 72 hour history of vulvar pain  Has history of HSV outbreaks tendernes is moderate to severe, not improved by anything or made worse No fever or systemic symptoms No adenopathy noted by patient Past Medical History:  Diagnosis Date  . Anxiety   . Chronic back pain   . Chronic headache   . Chronic pain   . Chronic pelvic pain in female   . Cocaine use   . Endometriosis   . Heart murmur    as a child  . Herpes genitalis in women   . Hx of opioid abuse   .  Hypokalemia   . Narcotic abuse (HCC)    5 yrs ago lortab, off few yrs  . Pneumonia    36 years  old  . Renal disorder Cyst on Kidney    Past Surgical History:  Procedure Laterality Date  . ABDOMINAL HYSTERECTOMY  2007   left ovary remains  . CARPAL TUNNEL RELEASE     both  . CHOLECYSTECTOMY  2010  . INCISION AND DRAINAGE ABSCESS N/A 07/14/2014   Procedure: INCISION AND DRAINAGE VULVAR ABSCESS;  Surgeon: [name], MD;  Location: [facility];  Service: Gynecology;  Laterality: N/A;  . LAPAROSCOPIC SALPINGO OOPHERECTOMY Left 08/15/2014   Procedure: LAPAROSCOPIC LEFT SALPINGO OOPHORECTOMY;  Surgeon: [name], MD;  Location: [facility];  Service: Gynecology;  Laterality: Left;  . TONSILLECTOMY    . TUBAL LIGATION      OB History    Gravida Para Term Preterm AB Living   4 4 4     4    SAB TAB Ectopic Multiple Live Births                  Allergies  Allergen Reactions  . Fentanyl Itching  . Lyrica [Pregabalin] Other (See Comments)    Causes her to pass out  . Nsaids Other (See Comments)    GI upset  . Tramadol Other (  See Comments)    Seizures   . Divalproex Sodium Anxiety and Other (See Comments)    'makes me feel really weird' , felt distant from reality  . Fioricet-Codeine [Butalbital-Apap-Caff-Cod] Itching  . Ibuprofen Other (See Comments)    GI upset, stomach pain  . Ketorolac Tromethamine Hives and Other (See Comments)    Stomach pain     Social History   Socioeconomic History  . Marital status: Married    Spouse name: None  . Number of children: 4  . Years of education: None  . Highest education level: None  Social Needs  . Financial resource strain: None  . Food insecurity - worry: None  . Food insecurity - inability: None  . Transportation needs - medical: None  . Transportation needs - non-medical: None  Occupational History  . Occupation: homemaker  Tobacco Use  . Smoking status: Current Every Day Smoker    Packs/day: 0.50    Years: 18.00     Pack years: 9.00    Types: Cigarettes  . Smokeless tobacco: Former NeurosurgeonUser    Types: Chew  Substance and Sexual Activity  . Alcohol use: No    Alcohol/week: 0.0 oz  . Drug use: No    Comment: no drugs for 3 years 05/2014  . Sexual activity: Not Currently    Partners: Male    Birth control/protection: Surgical    Comment: hyst  Other Topics Concern  . None  Social History Narrative  . None    Family History  Problem Relation Age of Onset  . Stroke Father   . Diverticulitis Father   . Alzheimer's disease Father   . Hypertension Mother   . Diabetes Mother   . Heart disease Unknown   . Arthritis Unknown   . Diabetes Unknown   . Kidney disease Unknown   . Heart attack Paternal Grandfather   . Alzheimer's disease Paternal Grandmother   . Heart attack Maternal Grandfather     Medications:       Current Outpatient Medications:  .  acyclovir cream (ZOVIRAX) 5 %, Apply 1 application topically every 3 (three) hours., Disp: 15 g, Rfl: 2 .  amphetamine-dextroamphetamine (ADDERALL) 20 MG tablet, Take 20 mg by mouth daily. , Disp: , Rfl:  .  lidocaine (XYLOCAINE) 5 % ointment, Apply 1 application topically as needed., Disp: 35.44 g, Rfl: 0 .  LORazepam (ATIVAN) 2 MG tablet, Take 2 mg at bedtime as needed by mouth for anxiety., Disp: , Rfl:  .  valACYclovir (VALTREX) 1000 MG tablet, Take 1 tablet (1,000 mg total) by mouth 3 (three) times daily., Disp: 30 tablet, Rfl: 0 .  oxyCODONE-acetaminophen (PERCOCET) 7.5-325 MG tablet, Take 1-2 tablets by mouth every 6 (six) hours as needed., Disp: 20 tablet, Rfl: 0 .  valACYclovir (VALTREX) 1000 MG tablet, Take 1 tablet (1,000 mg total) by mouth 3 (three) times daily., Disp: 30 tablet, Rfl: 11  Objective Blood pressure 104/64, pulse 82, height 5\' 1"  (1.549 m), weight 111 lb (50.3 kg).  General WDWN female mild distress Vulva:  Tender swollen, 22 small vesicular ulcers consistent with HSV outbreak Vagina:  normal mucosa, no  discharge    Pertinent ROS No burning with urination, frequency or urgency No nausea, vomiting or diarrhea Nor fever chills or other constitutional symptoms   Labs or studies     Impression Diagnoses this Encounter::   ICD-10-CM   1. Herpes simplex type II infection B00.9   2. Herpes simplex of female genitalia A60.09  Established relevant diagnosis(es): History of HSV outbreaks  Plan/Recommendations: Meds ordered this encounter  Medications  . oxyCODONE-acetaminophen (PERCOCET) 7.5-325 MG tablet    Sig: Take 1-2 tablets by mouth every 6 (six) hours as needed.    Dispense:  20 tablet    Refill:  0  . valACYclovir (VALTREX) 1000 MG tablet    Sig: Take 1 tablet (1,000 mg total) by mouth 3 (three) times daily.    Dispense:  30 tablet    Refill:  11    Labs or Scans Ordered: No orders of the defined types were placed in this encounter.   Management:: meds as ordered above  Follow up Return if symptoms worsen or fail to improve.     .   All questions were answered.

## 2017-02-25 ENCOUNTER — Telehealth: Payer: Self-pay | Admitting: Orthopedic Surgery

## 2017-03-19 ENCOUNTER — Ambulatory Visit (INDEPENDENT_AMBULATORY_CARE_PROVIDER_SITE_OTHER): Payer: BLUE CROSS/BLUE SHIELD | Admitting: Orthopaedic Surgery

## 2017-03-22 ENCOUNTER — Encounter: Payer: Self-pay | Admitting: Obstetrics & Gynecology

## 2017-03-24 ENCOUNTER — Encounter (HOSPITAL_COMMUNITY): Payer: Self-pay | Admitting: Emergency Medicine

## 2017-03-24 ENCOUNTER — Emergency Department (HOSPITAL_COMMUNITY): Payer: BLUE CROSS/BLUE SHIELD

## 2017-03-24 ENCOUNTER — Emergency Department (HOSPITAL_COMMUNITY)
Admission: EM | Admit: 2017-03-24 | Discharge: 2017-03-24 | Disposition: A | Discharge status: Home / Self Care | Payer: BLUE CROSS/BLUE SHIELD | Attending: Emergency Medicine | Admitting: Emergency Medicine

## 2017-03-24 DIAGNOSIS — R1031 Right lower quadrant pain: Secondary | ICD-10-CM | POA: Diagnosis present

## 2017-03-24 DIAGNOSIS — F17210 Nicotine dependence, cigarettes, uncomplicated: Secondary | ICD-10-CM | POA: Diagnosis not present

## 2017-03-24 DIAGNOSIS — Z79899 Other long term (current) drug therapy: Secondary | ICD-10-CM | POA: Insufficient documentation

## 2017-03-24 DIAGNOSIS — G8929 Other chronic pain: Secondary | ICD-10-CM | POA: Diagnosis not present

## 2017-03-24 DIAGNOSIS — R1030 Lower abdominal pain, unspecified: Secondary | ICD-10-CM

## 2017-03-24 DIAGNOSIS — R102 Pelvic and perineal pain: Secondary | ICD-10-CM | POA: Insufficient documentation

## 2017-03-24 DIAGNOSIS — R1011 Right upper quadrant pain: Secondary | ICD-10-CM | POA: Diagnosis not present

## 2017-03-24 LAB — CBC
HEMATOCRIT: 44.9 % (ref 36.0–46.0)
Hemoglobin: 14.7 g/dL (ref 12.0–15.0)
MCH: 33.3 pg (ref 26.0–34.0)
MCHC: 32.7 g/dL (ref 30.0–36.0)
MCV: 101.6 fL — ABNORMAL HIGH (ref 78.0–100.0)
PLATELETS: 327 10*3/uL (ref 150–400)
RBC: 4.42 MIL/uL (ref 3.87–5.11)
RDW: 12.8 % (ref 11.5–15.5)
WBC: 8.4 10*3/uL (ref 4.0–10.5)

## 2017-03-24 LAB — COMPREHENSIVE METABOLIC PANEL
ALT: 27 U/L (ref 14–54)
AST: 23 U/L (ref 15–41)
Albumin: 4.4 g/dL (ref 3.5–5.0)
Alkaline Phosphatase: 119 U/L (ref 38–126)
Anion gap: 12 (ref 5–15)
BILIRUBIN TOTAL: 0.5 mg/dL (ref 0.3–1.2)
BUN: 8 mg/dL (ref 6–20)
CO2: 28 mmol/L (ref 22–32)
CREATININE: 0.72 mg/dL (ref 0.44–1.00)
Calcium: 9.8 mg/dL (ref 8.9–10.3)
Chloride: 100 mmol/L — ABNORMAL LOW (ref 101–111)
Glucose, Bld: 86 mg/dL (ref 65–99)
POTASSIUM: 3.8 mmol/L (ref 3.5–5.1)
Sodium: 140 mmol/L (ref 135–145)
TOTAL PROTEIN: 8.6 g/dL — AB (ref 6.5–8.1)

## 2017-03-24 LAB — URINALYSIS, ROUTINE W REFLEX MICROSCOPIC
Bilirubin Urine: NEGATIVE
Glucose, UA: NEGATIVE mg/dL
Hgb urine dipstick: NEGATIVE
KETONES UR: NEGATIVE mg/dL
LEUKOCYTES UA: NEGATIVE
NITRITE: NEGATIVE
PROTEIN: NEGATIVE mg/dL
Specific Gravity, Urine: 1.004 — ABNORMAL LOW (ref 1.005–1.030)
pH: 7 (ref 5.0–8.0)

## 2017-03-24 LAB — HCG, QUANTITATIVE, PREGNANCY: hCG, Beta Chain, Quant, S: 1 m[IU]/mL (ref <5)

## 2017-03-24 LAB — LIPASE, BLOOD: LIPASE: 26 U/L (ref 11–51)

## 2017-03-24 MED ORDER — HYDROMORPHONE HCL 1 MG/ML IJ SOLN
0.5000 mg | Freq: Once | INTRAMUSCULAR | Status: AC
Start: 2017-03-24 — End: 2017-03-24
  Administered 2017-03-24: 0.5 mg via INTRAVENOUS
  Filled 2017-03-24: qty 1

## 2017-03-24 MED ORDER — ONDANSETRON HCL 4 MG/2ML IJ SOLN
4.0000 mg | Freq: Once | INTRAMUSCULAR | Status: AC | PRN
  Administered 2017-03-24: 4 mg via INTRAVENOUS
  Filled 2017-03-24: qty 2

## 2017-03-24 MED ORDER — ONDANSETRON 4 MG PO TBDP: ORAL_TABLET | ORAL | 0 refills | Status: DC

## 2017-03-24 MED ORDER — SODIUM CHLORIDE 0.9 % IV BOLUS (SEPSIS)
1000.0000 mL | Freq: Once | INTRAVENOUS | Status: AC
  Administered 2017-03-24: 1000 mL via INTRAVENOUS

## 2017-03-24 MED ORDER — ONDANSETRON HCL 4 MG/2ML IJ SOLN
4.0000 mg | Freq: Once | INTRAMUSCULAR | Status: DC
  Filled 2017-03-24: qty 2

## 2017-03-24 MED ORDER — PANTOPRAZOLE SODIUM 20 MG PO TBEC: 20.0000 mg | DELAYED_RELEASE_TABLET | Freq: Every day | ORAL | 0 refills | Status: DC

## 2017-03-24 MED ORDER — HYDROMORPHONE HCL 1 MG/ML IJ SOLN
1.0000 mg | Freq: Once | INTRAMUSCULAR | Status: AC
  Administered 2017-03-24: 1 mg via INTRAVENOUS
  Filled 2017-03-24: qty 1

## 2017-03-24 MED ORDER — IOPAMIDOL (ISOVUE-300) INJECTION 61%
100.0000 mL | Freq: Once | INTRAVENOUS | Status: AC | PRN
  Administered 2017-03-24: 100 mL via INTRAVENOUS

## 2017-03-24 MED ORDER — HYDROCODONE-ACETAMINOPHEN 5-325 MG PO TABS: 1.0000 | ORAL_TABLET | Freq: Four times a day (QID) | ORAL | 0 refills | Status: DC | PRN

## 2017-03-24 MED ORDER — PROMETHAZINE HCL 25 MG/ML IJ SOLN
12.5000 mg | Freq: Once | INTRAMUSCULAR | Status: AC
  Administered 2017-03-24: 12.5 mg via INTRAVENOUS
  Filled 2017-03-24: qty 1

## 2017-03-24 NOTE — ED Triage Notes (Signed)
Pt C/O RLQ pain and vomiting some 2300 last night.

## 2017-03-24 NOTE — ED Notes (Signed)
Patient returned from CT

## 2017-03-24 NOTE — ED Provider Notes (Signed)
[facility] EMERGENCY DEPARTMENT Provider Note   CSN: [id number] Arrival date & time: 03/24/17  0446     History   Chief Complaint Chief Complaint  Patient presents with  . Abdominal Pain    HPI [name] is a 37 y.o. female.  Patient complains of right upper and right lower quadrant abdominal pain.  Also some epigastric pain   The history is provided by the patient.  Abdominal Pain   This is a recurrent problem. The current episode started 12 to 24 hours ago. The problem occurs constantly. The problem has not changed since onset.The pain is associated with an unknown factor. The pain is located in the RUQ. The quality of the pain is sharp. The pain is at a severity of 7/10. The pain is moderate. Pertinent negatives include diarrhea, flatus, frequency, hematuria and headaches.    Past Medical History:  Diagnosis Date  . Anxiety   . Chronic back pain   . Chronic headache   . Chronic pain   . Chronic pelvic pain in female   . Cocaine use   . Endometriosis   . Heart murmur    as a child  . Herpes genitalis in women   . Hx of opioid abuse   . Hypokalemia   . Narcotic abuse (HCC)    5 yrs ago lortab, off few yrs  . Pneumonia    37 years  old  . Renal disorder Cyst on Kidney    Patient Active Problem List   Diagnosis Date Noted  . Condyloma vulva 08/02/2015  . Herpes simplex type II infection 07/27/2015  . Herpes simplex of female genitalia 07/25/2015  . Depression with anxiety 11/10/2014  . Insomnia 11/10/2014  . Chronic pelvic pain in female 08/15/2014  . Vulvar abscess left labia majora 07/14/2014  . Other and unspecified ovarian cyst 09/16/2013  . Abdominal pain, left lower quadrant 09/16/2013  . LBP (low back pain) 12/16/2012  . Pelvic and perineal pain 10/25/2012  . Shoulder pain 03/17/2012  . Colitis 01/16/2012  . Abdominal pain 01/16/2012  . Cocaine abuse (HCC) 09/28/2011  . Dental decay 09/28/2011  . Hormonal disorder 09/28/2011  . Hypokalemia     . Narcotic abuse [facility])     Past Surgical History:  Procedure Laterality Date  . ABDOMINAL HYSTERECTOMY  2007   left ovary remains  . CARPAL TUNNEL RELEASE     both  . CHOLECYSTECTOMY  2010  . INCISION AND DRAINAGE ABSCESS N/A 07/14/2014   Procedure: INCISION AND DRAINAGE VULVAR ABSCESS;  Surgeon: [name], MD;  Location: [facility];  Service: Gynecology;  Laterality: N/A;  . LAPAROSCOPIC SALPINGO OOPHERECTOMY Left 08/15/2014   Procedure: LAPAROSCOPIC LEFT SALPINGO OOPHORECTOMY;  Surgeon: [name], MD;  Location: [facility];  Service: Gynecology;  Laterality: Left;  . TONSILLECTOMY    . TUBAL LIGATION      OB History    Gravida Para Term Preterm AB Living   4 4 4     4    SAB TAB Ectopic Multiple Live Births                   Home Medications    Prior to Admission medications   Medication Sig Start Date End Date Taking? Authorizing Provider  amphetamine-dextroamphetamine (ADDERALL) 20 MG tablet Take 20 mg by mouth daily.    Yes [provider]  LORazepam (ATIVAN) 2 MG tablet Take 2 mg at bedtime as needed by mouth for anxiety.   Yes  [provider]  acyclovir cream (ZOVIRAX) 5 % Apply 1 application topically every 3 (three) hours. 07/14/16   Lazaro Arms, MD  HYDROcodone-acetaminophen (NORCO/VICODIN) 5-325 MG tablet Take 1 tablet by mouth every 6 (six) hours as needed for moderate pain. 03/24/17   [name], MD  lidocaine (XYLOCAINE) 5 % ointment Apply 1 application topically as needed. 02/09/17   Gilda Crease, MD  ondansetron (ZOFRAN ODT) 4 MG disintegrating tablet 4mg  ODT q4 hours prn nausea/vomit 03/24/17   [name], MD  oxyCODONE-acetaminophen (PERCOCET) 7.5-325 MG tablet Take 1-2 tablets by mouth every 6 (six) hours as needed. 02/10/17   Lazaro Arms, MD  pantoprazole (PROTONIX) 20 MG tablet Take 1 tablet (20 mg total) by mouth daily. 03/24/17   [name], MD  valACYclovir (VALTREX) 1000 MG tablet Take 1 tablet (1,000 mg total) by  mouth 3 (three) times daily. 07/14/16   Rancour, Jeannett Senior, MD  valACYclovir (VALTREX) 1000 MG tablet Take 1 tablet (1,000 mg total) by mouth 3 (three) times daily. 02/10/17   Lazaro Arms, MD    Family History Family History  Problem Relation Age of Onset  . Stroke Father   . Diverticulitis Father   . Alzheimer's disease Father   . Hypertension Mother   . Diabetes Mother   . Heart disease Unknown   . Arthritis Unknown   . Diabetes Unknown   . Kidney disease Unknown   . Heart attack Paternal Grandfather   . Alzheimer's disease Paternal Grandmother   . Heart attack Maternal Grandfather     Social History Social History   Tobacco Use  . Smoking status: Current Every Day Smoker    Packs/day: 0.50    Years: 18.00    Pack years: 9.00    Types: Cigarettes  . Smokeless tobacco: Former Neurosurgeon    Types: Chew  Substance Use Topics  . Alcohol use: No    Alcohol/week: 0.0 oz  . Drug use: No    Comment: no drugs for 3 years 05/2014     Allergies   Fentanyl; Lyrica [pregabalin]; Nsaids; Tramadol; Divalproex sodium; Fioricet-codeine [butalbital-apap-caff-cod]; Ibuprofen; and Ketorolac tromethamine   Review of Systems Review of Systems  Constitutional: Negative for appetite change and fatigue.  HENT: Negative for congestion, ear discharge and sinus pressure.   Eyes: Negative for discharge.  Respiratory: Negative for cough.   Cardiovascular: Negative for chest pain.  Gastrointestinal: Positive for abdominal pain. Negative for diarrhea and flatus.  Genitourinary: Negative for frequency and hematuria.  Musculoskeletal: Negative for back pain.  Skin: Negative for rash.  Neurological: Negative for seizures and headaches.  Psychiatric/Behavioral: Negative for hallucinations.     Physical Exam Updated Vital Signs BP 125/90 (BP Location: Right Arm)   Pulse 71   Temp 97.7 F (36.5 C) (Oral)   Resp 15   Ht 5\' 1"  (1.549 m)   Wt 49.9 kg (110 lb)   SpO2 98%   BMI 20.78 kg/m    Physical Exam  Constitutional: She is oriented to person, place, and time. She appears well-developed.  HENT:  Head: Normocephalic.  Eyes: Conjunctivae and EOM are normal. No scleral icterus.  Neck: Neck supple. No thyromegaly present.  Cardiovascular: Normal rate and regular rhythm. Exam reveals no gallop and no friction rub.  No murmur heard. Pulmonary/Chest: No stridor. She has no wheezes. She has no rales. She exhibits no tenderness.  Abdominal: She exhibits no distension. There is tenderness. There is no rebound.  Musculoskeletal: Normal range of motion. She  exhibits no edema.  Lymphadenopathy:    She has no cervical adenopathy.  Neurological: She is oriented to person, place, and time. She exhibits normal muscle tone. Coordination normal.  Skin: No rash noted. No erythema.  Psychiatric: She has a normal mood and affect. Her behavior is normal.     ED Treatments / Results  Labs (all labs ordered are listed, but only abnormal results are displayed) Labs Reviewed  COMPREHENSIVE METABOLIC PANEL - Abnormal; Notable for the following components:      Result Value   Chloride 100 (*)    Total Protein 8.6 (*)    All other components within normal limits  CBC - Abnormal; Notable for the following components:   MCV 101.6 (*)    All other components within normal limits  URINALYSIS, ROUTINE W REFLEX MICROSCOPIC - Abnormal; Notable for the following components:   Color, Urine STRAW (*)    Specific Gravity, Urine 1.004 (*)    All other components within normal limits  LIPASE, BLOOD  HCG, QUANTITATIVE, PREGNANCY    EKG  EKG Interpretation None       Radiology Ct Abdomen Pelvis W Contrast  Result Date: 03/24/2017 CLINICAL DATA:  37 year old female with abdominal distention and lower pelvic pain since midnight. EXAM: CT ABDOMEN AND PELVIS WITH CONTRAST TECHNIQUE: Multidetector CT imaging of the abdomen and pelvis was performed using the standard protocol following bolus  administration of intravenous contrast. CONTRAST:  100mL ISOVUE-300 IOPAMIDOL (ISOVUE-300) INJECTION 61% COMPARISON:  CT Abdomen and Pelvis 07/23/2015 FINDINGS: Lower chest: Negative lung bases aside from mild respiratory motion. No pericardial or pleural effusion. Hepatobiliary: Surgically absent gallbladder. Stable intra-and extrahepatic biliary ducts. Liver enhancement stable and within normal limits. Pancreas: Negative. Spleen: Negative. Adrenals/Urinary Tract: Normal adrenal glands. Bilateral renal enhancement is stable and within normal limits. Small benign appearing left lateral renal midpole cortical cyst (series 2, image 29). Right extrarenal pelvis appears to be new since 2,017, but the proximal right ureter is nondilated. Course of the visible right ureter is within normal limits. Multiple small bilateral pelvic phleboliths are unchanged. Unremarkable urinary bladder. Stomach/Bowel: Decompressed distal rectum. Gas-filled proximal rectum. Redundant sigmoid colon with gas and retained stool. No definite sigmoid colon, and no sigmoid mesentery inflammation. Gas and stool in the descending colon and transverse colon. Oral contrast has reached the ascending colon. Normal appendix (series 2, image 63. The cecum and terminal ileum appear within normal limits. No dilated or abnormal small bowel loops. Stomach and duodenum are within normal limits. No abdominal free air or free fluid. Vascular/Lymphatic: Major arterial structures in the abdomen and pelvis appear patent. No definite atherosclerosis. Portal venous system is patent. No lymphadenopathy. Reproductive: Surgically absent uterus. Diminutive or absent ovaries. Other: No pelvic free fluid. Musculoskeletal: No acute osseous abnormality identified. IMPRESSION: No explanation for acute abdominal or pelvic pain. CT appearance of the abdomen and pelvis remains within normal limits; - normal appendix. - prior hysterectomy and cholecystectomy. Electronically  Signed   By: Odessa FlemingH  Hall M.D.   On: 03/24/2017 10:48    Procedures Procedures (including critical care time)  Medications Ordered in ED Medications  ondansetron (ZOFRAN) injection 4 mg (0 mg Intravenous Hold 03/24/17 0801)  ondansetron (ZOFRAN) injection 4 mg (4 mg Intravenous Given 03/24/17 0620)  sodium chloride 0.9 % bolus 1,000 mL (1,000 mLs Intravenous New Bag/Given 03/24/17 0758)  HYDROmorphone (DILAUDID) injection 1 mg (1 mg Intravenous Given 03/24/17 0758)  HYDROmorphone (DILAUDID) injection 0.5 mg (0.5 mg Intravenous Given 03/24/17 0919)  promethazine (PHENERGAN)  injection 12.5 mg (12.5 mg Intravenous Given 03/24/17 1014)  iopamidol (ISOVUE-300) 61 % injection 100 mL (100 mLs Intravenous Contrast Given 03/24/17 1023)     Initial Impression / Assessment and Plan / ED Course  I have reviewed the triage vital signs and the nursing notes.  Pertinent labs & imaging results that were available during my care of the patient were reviewed by me and considered in my medical decision making (see chart for details).     Patient with significant abdominal pain that has been relieved with pain medicine.  Her labs and her CT scan are unremarkable.  Patient will be discharged home on Protonix and pain medicine nausea medicine and referred to GI or she can follow-up with her family doctor  Final Clinical Impressions(s) / ED Diagnoses   Final diagnoses:  Lower abdominal pain    ED Discharge Orders        Ordered    pantoprazole (PROTONIX) 20 MG tablet  Daily     03/24/17 1310    HYDROcodone-acetaminophen (NORCO/VICODIN) 5-325 MG tablet  Every 6 hours PRN     03/24/17 1310    ondansetron (ZOFRAN ODT) 4 MG disintegrating tablet     03/24/17 1310       [name], MD 03/24/17 1315

## 2017-03-24 NOTE — Discharge Instructions (Signed)
Follow-up with Dr. Darrick PennaFields or follow-up with your family doctor next week for recheck

## 2017-03-31 ENCOUNTER — Encounter: Payer: Self-pay | Admitting: Obstetrics & Gynecology

## 2017-03-31 ENCOUNTER — Ambulatory Visit (INDEPENDENT_AMBULATORY_CARE_PROVIDER_SITE_OTHER): Payer: BLUE CROSS/BLUE SHIELD | Admitting: Obstetrics & Gynecology

## 2017-03-31 VITALS — BP 100/60 | HR 96 | Ht 61.0 in | Wt 120.0 lb

## 2017-03-31 DIAGNOSIS — N952 Postmenopausal atrophic vaginitis: Secondary | ICD-10-CM | POA: Diagnosis not present

## 2017-03-31 MED ORDER — ESTRADIOL 0.1 MG/GM VA CREA: 1.0000 | TOPICAL_CREAM | Freq: Every day | VAGINAL | 11 refills | Status: AC

## 2017-03-31 MED ORDER — ESTRADIOL 2 MG PO TABS: 2.0000 mg | ORAL_TABLET | Freq: Every day | ORAL | 11 refills | Status: DC

## 2017-03-31 MED ORDER — OXYCODONE-ACETAMINOPHEN 7.5-325 MG PO TABS: 1.0000 | ORAL_TABLET | Freq: Four times a day (QID) | ORAL | 0 refills | Status: DC | PRN

## 2017-03-31 NOTE — Progress Notes (Signed)
Patient ID: [name], female   DOB: 30-May-1980, 37 y.o.   MRN: [id number]      Chief Complaint  Patient presents with  . vaginal soreness    talk about hormone/ also need pap      37 y.o. [id number] No LMP recorded. Patient has had a hysterectomy. The current method of family planning is status post hysterectomy.  Outpatient Encounter Medications as of 03/31/2017  Medication Sig Note  . amphetamine-dextroamphetamine (ADDERALL) 20 MG tablet Take 20 mg by mouth daily.  01/20/2017: Filled today but has not picked up via pharmacy records  . [DISCONTINUED] acyclovir cream (ZOVIRAX) 5 % Apply 1 application topically every 3 (three) hours.   . [DISCONTINUED] lidocaine (XYLOCAINE) 5 % ointment Apply 1 application topically as needed.   . [DISCONTINUED] LORazepam (ATIVAN) 2 MG tablet Take 1 mg by mouth at bedtime as needed for anxiety.  01/20/2017: FIlled today per pharmacy records  . [DISCONTINUED] ondansetron (ZOFRAN ODT) 4 MG disintegrating tablet 4mg  ODT q4 hours prn nausea/vomit   . [DISCONTINUED] pantoprazole (PROTONIX) 20 MG tablet Take 1 tablet (20 mg total) by mouth daily. (Patient not taking: Reported on 08/05/2017)   . [DISCONTINUED] valACYclovir (VALTREX) 1000 MG tablet Take 1 tablet (1,000 mg total) by mouth 3 (three) times daily.   . [DISCONTINUED] valACYclovir (VALTREX) 1000 MG tablet Take 1 tablet (1,000 mg total) by mouth 3 (three) times daily. (Patient not taking: Reported on 08/05/2017)   . estradiol (ESTRACE VAGINAL) 0.1 MG/GM vaginal cream Place 1 Applicatorful vaginally at bedtime.   . [DISCONTINUED] estradiol (ESTRACE) 2 MG tablet Take 1 tablet (2 mg total) by mouth daily. (Patient not taking: Reported on 08/05/2017)   . [DISCONTINUED] HYDROcodone-acetaminophen (NORCO/VICODIN) 5-325 MG tablet Take 1 tablet by mouth every 6 (six) hours as needed for moderate pain. (Patient not taking: Reported on 03/31/2017)   . [DISCONTINUED] oxyCODONE-acetaminophen (PERCOCET) 7.5-325 MG tablet  Take 1-2 tablets by mouth every 6 (six) hours as needed. (Patient not taking: Reported on 03/31/2017)   . [DISCONTINUED] oxyCODONE-acetaminophen (PERCOCET) 7.5-325 MG tablet Take 1-2 tablets by mouth every 6 (six) hours as needed.    No facility-administered encounter medications on file as of 03/31/2017.     Subjective Presents complaining of vaginal soreness dryness burning Long standing Moderate intensity Flares after warm baths Past Medical History:  Diagnosis Date  . Anxiety   . Chronic back pain   . Chronic headache   . Chronic pain   . Chronic pelvic pain in female   . Cocaine use   . Endometriosis   . Heart murmur    as a child  . Herpes genitalis in women   . Hx of opioid abuse   . Hypokalemia   . Narcotic abuse (HCC)    5 yrs ago lortab, off few yrs  . Pneumonia    37 years  old  . Renal disorder Cyst on Kidney    Past Surgical History:  Procedure Laterality Date  . ABDOMINAL HYSTERECTOMY  2007   left ovary remains  . CARPAL TUNNEL RELEASE     both  . CHOLECYSTECTOMY  2010  . INCISION AND DRAINAGE ABSCESS N/A 07/14/2014   Procedure: INCISION AND DRAINAGE VULVAR ABSCESS;  Surgeon: [name], MD;  Location: [facility];  Service: Gynecology;  Laterality: N/A;  . LAPAROSCOPIC SALPINGO OOPHERECTOMY Left 08/15/2014   Procedure: LAPAROSCOPIC LEFT SALPINGO OOPHORECTOMY;  Surgeon: [name], MD;  Location: [facility];  Service: Gynecology;  Laterality: Left;  . TONSILLECTOMY    .  TUBAL LIGATION      OB History    Gravida  4   Para  4   Term  4   Preterm      AB      Living  4     SAB      TAB      Ectopic      Multiple      Live Births              Allergies  Allergen Reactions  . Fentanyl Itching  . Lyrica [Pregabalin] Other (See Comments)    Causes her to pass out  . Morphine And Related Itching  . Nsaids Other (See Comments)    GI upset  . Tramadol Other (See Comments)    Seizures   . Divalproex Sodium Anxiety and Other (See  Comments)    'makes me feel really weird' , felt distant from reality  . Fioricet-Codeine [Butalbital-Apap-Caff-Cod] Itching  . Ibuprofen Other (See Comments)    GI upset, stomach pain  . Ketorolac Tromethamine Hives and Other (See Comments)    Stomach pain     Social History   Socioeconomic History  . Marital status: Married    Spouse name: Not on file  . Number of children: 4  . Years of education: Not on file  . Highest education level: Not on file  Occupational History  . Occupation: homemaker  Social Needs  . Financial resource strain: Not on file  . Food insecurity:    Worry: Not on file    Inability: Not on file  . Transportation needs:    Medical: Not on file    Non-medical: Not on file  Tobacco Use  . Smoking status: Current Every Day Smoker    Packs/day: 1.00    Years: 18.00    Pack years: 18.00    Types: Cigarettes  . Smokeless tobacco: Never Used  Substance and Sexual Activity  . Alcohol use: No    Alcohol/week: 0.0 standard drinks  . Drug use: No    Types: Cocaine    Comment: no drugs for 3 years 05/2014  . Sexual activity: Not Currently    Partners: Male    Birth control/protection: Surgical    Comment: hyst  Lifestyle  . Physical activity:    Days per week: Not on file    Minutes per session: Not on file  . Stress: Not on file  Relationships  . Social connections:    Talks on phone: Not on file    Gets together: Not on file    Attends religious service: Not on file    Active member of club or organization: Not on file    Attends meetings of clubs or organizations: Not on file    Relationship status: Not on file  Other Topics Concern  . Not on file  Social History Narrative  . Not on file    Family History  Problem Relation Age of Onset  . Stroke Father   . Diverticulitis Father   . Alzheimer's disease Father   . Colon polyps Father        diagnosed in 82s-70s   . Hypertension Mother   . Diabetes Mother   . Heart disease Unknown     . Arthritis Unknown   . Diabetes Unknown   . Kidney disease Unknown   . Heart attack Paternal Grandfather   . Alzheimer's disease Paternal Grandmother   . Heart attack Maternal Grandfather   . Liver  disease Neg Hx   . Colon cancer Neg Hx     Medications:       Current Outpatient Medications:  .  amphetamine-dextroamphetamine (ADDERALL) 20 MG tablet, Take 20 mg by mouth daily. , Disp: , Rfl:  .  estradiol (ESTRACE VAGINAL) 0.1 MG/GM vaginal cream, Place 1 Applicatorful vaginally at bedtime., Disp: 30 g, Rfl: 11 .  oxyCODONE-acetaminophen (PERCOCET/ROXICET) 5-325 MG tablet, Take 1-2 tablets by mouth every 4 (four) hours as needed for moderate pain or severe pain (Postoperative pain). As needed for postoperative pain. May alternate with tramadol, Disp: 10 tablet, Rfl: 0 .  vortioxetine HBr (TRINTELLIX) 5 MG TABS tablet, Take 5 mg by mouth daily., Disp: , Rfl:   Objective Blood pressure 100/60, pulse 96, height 5\' 1"  (1.549 m), weight 120 lb (54.4 kg).  General WDWN female NAD Vulva:  Atrophic changes Vagina:  atrophic Cervix:  absent Uterus:  uterus absent Adnexa: ovaries:not present,     Pertinent ROS No burning with urination, frequency or urgency No nausea, vomiting or diarrhea Nor fever chills or other constitutional symptoms   Labs or studies     Impression Diagnoses this Encounter::   ICD-10-CM   1. Atrophic vaginitis N95.2     Established relevant diagnosis(es):   Plan/Recommendations: Meds ordered this encounter  Medications  . DISCONTD: estradiol (ESTRACE) 2 MG tablet    Sig: Take 1 tablet (2 mg total) by mouth daily.    Dispense:  30 tablet    Refill:  11  . estradiol (ESTRACE VAGINAL) 0.1 MG/GM vaginal cream    Sig: Place 1 Applicatorful vaginally at bedtime.    Dispense:  30 g    Refill:  11  . DISCONTD: oxyCODONE-acetaminophen (PERCOCET) 7.5-325 MG tablet    Sig: Take 1-2 tablets by mouth every 6 (six) hours as needed.    Dispense:  20 tablet     Refill:  0    Labs or Scans Ordered: No orders of the defined types were placed in this encounter.   Management:: Estrace orally and vaginally  Follow up Return if symptoms worsen or fail to improve.        All questions were answered.

## 2017-04-08 ENCOUNTER — Encounter: Payer: Self-pay | Admitting: Obstetrics and Gynecology

## 2017-04-08 ENCOUNTER — Ambulatory Visit (INDEPENDENT_AMBULATORY_CARE_PROVIDER_SITE_OTHER): Payer: BLUE CROSS/BLUE SHIELD | Admitting: Obstetrics and Gynecology

## 2017-04-08 ENCOUNTER — Telehealth: Payer: Self-pay | Admitting: Obstetrics and Gynecology

## 2017-04-08 VITALS — BP 98/62 | HR 81 | Ht 61.0 in | Wt 116.4 lb

## 2017-04-08 DIAGNOSIS — N94819 Vulvodynia, unspecified: Secondary | ICD-10-CM

## 2017-04-08 MED ORDER — OXYCODONE-ACETAMINOPHEN 5-325 MG PO TABS: 1.0000 | ORAL_TABLET | ORAL | 0 refills | Status: AC | PRN

## 2017-04-08 MED ORDER — PREDNISONE 20 MG PO TABS: 40.0000 mg | ORAL_TABLET | Freq: Every day | ORAL | 0 refills | Status: DC

## 2017-04-08 NOTE — Progress Notes (Signed)
Patient ID: Angel RheaCheri Rolin, female   DOB: 11-Dec-1980, 37 y.o.   MRN: [id number]   [facility]  @DATE @            Patient name: [name] MRN [id number]  Date of birth: 11-Dec-1980  CC & HPI:  Angel RheaCheri Chavarria is a 37 y.o. female presenting today for worsening vaginal pain that started about three days ago. She saw Dr. Despina HiddenEure on 03/31/2017 and was prescribed acyclovir. She has been taking that with mild to no relief as well as using ice packs without relief. She would like percocet for her pain. Pt has hx of opiate overuse. The patient denies fever, chills or any other symptoms or complaints at this time.   ROS:  ROS +vaginal pain vaginal atrophy, slight d/c per vagina -fever -chills All systems are negative except as noted in the HPI and PMH.   Pertinent History Reviewed:   Reviewed: Significant for herpes, chronic pelvic pain, endometriosis Medical         Past Medical History:  Diagnosis Date   Anxiety    Chronic back pain    Chronic headache    Chronic pain    Chronic pelvic pain in female    Cocaine use    Endometriosis    Heart murmur    as a child   Herpes genitalis in women    Hx of opioid abuse    Hypokalemia    Narcotic abuse (HCC)    5 yrs ago lortab, off few yrs   Pneumonia    37 years  old   Renal disorder Cyst on Kidney                              Surgical Hx:    Past Surgical History:  Procedure Laterality Date   ABDOMINAL HYSTERECTOMY  2007   left ovary remains   CARPAL TUNNEL RELEASE     both   CHOLECYSTECTOMY  2010   INCISION AND DRAINAGE ABSCESS N/A 07/14/2014   Procedure: INCISION AND DRAINAGE VULVAR ABSCESS;  Surgeon: Tilda BurrowJohn Ferguson V, MD;  Location: [facility];  Service: Gynecology;  Laterality: N/A;   LAPAROSCOPIC SALPINGO OOPHERECTOMY Left 08/15/2014   Procedure: LAPAROSCOPIC LEFT SALPINGO OOPHORECTOMY;  Surgeon: Tilda BurrowJohn Ferguson V, MD;  Location: [facility];  Service: Gynecology;  Laterality: Left;   TONSILLECTOMY     TUBAL LIGATION      Medications: Reviewed & Updated - see associated section                       Current Outpatient Medications:    acetaminophen (TYLENOL) 325 MG tablet, Take 325 mg by mouth as needed., Disp: , Rfl:    amphetamine-dextroamphetamine (ADDERALL) 20 MG tablet, Take 20 mg by mouth daily. , Disp: , Rfl:    estradiol (ESTRACE VAGINAL) 0.1 MG/GM vaginal cream, Place 1 Applicatorful vaginally at bedtime., Disp: 30 g, Rfl: 11   estradiol (ESTRACE) 2 MG tablet, Take 1 tablet (2 mg total) by mouth daily., Disp: 30 tablet, Rfl: 11   LORazepam (ATIVAN) 2 MG tablet, Take 1 mg by mouth at bedtime as needed for anxiety. , Disp: , Rfl:    pantoprazole (PROTONIX) 20 MG tablet, Take 1 tablet (20 mg total) by mouth daily., Disp: 30 tablet, Rfl: 0   valACYclovir (VALTREX) 1000 MG tablet, Take 1 tablet (1,000 mg total) by mouth 3 (three) times daily., Disp: 30 tablet, Rfl:  11   Social History: Reviewed -  reports that she has been smoking cigarettes.  She has a 18.00 pack-year smoking history. she has never used smokeless tobacco.  Objective Findings:  Vitals: Blood pressure 98/62, pulse 81, height 5\' 1"  (1.549 m), weight 116 lb 6.4 oz (52.8 kg).  PHYSICAL EXAMINATION General appearance - alert, well appearing, and in no distress, oriented to person, place, and time and normal appearing weight Mental status - alert, oriented to person, place, and time, normal mood, behavior, speech, dress, motor activity, and thought processes, affect appropriate to mood  PELVIC External genitalia - tiny 1mm areas of excoriation , ? 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 Continue Acyclovir  2. Rx Prednisone 40 mg qd x 5 days 3. Percocet 5mg  tabs x 20   By signing my name below, I, Diona Browner, attest that this documentation has been prepared  under the direction and in the presence of [name], MD. Electronically Signed: Diona Browner, Medical Scribe. 04/08/17. 2:47 PM.  I personally performed the services described in this documentation, which was SCRIBED in my presence. The recorded information has been reviewed and considered accurate. It has been edited as necessary during review. [name], MD

## 2017-04-09 ENCOUNTER — Other Ambulatory Visit: Payer: Self-pay | Admitting: Obstetrics & Gynecology

## 2017-05-20 ENCOUNTER — Ambulatory Visit (INDEPENDENT_AMBULATORY_CARE_PROVIDER_SITE_OTHER): Payer: BLUE CROSS/BLUE SHIELD | Admitting: Obstetrics and Gynecology

## 2017-05-20 ENCOUNTER — Encounter: Payer: Self-pay | Admitting: Obstetrics and Gynecology

## 2017-05-20 ENCOUNTER — Telehealth: Payer: Self-pay | Admitting: *Deleted

## 2017-05-20 VITALS — BP 110/60 | HR 96 | Ht 61.0 in | Wt 121.4 lb

## 2017-05-20 DIAGNOSIS — A6009 Herpesviral infection of other urogenital tract: Secondary | ICD-10-CM

## 2017-05-20 DIAGNOSIS — Z765 Malingerer [conscious simulation]: Secondary | ICD-10-CM | POA: Diagnosis not present

## 2017-05-20 MED ORDER — HYDROCODONE-ACETAMINOPHEN 7.5-325 MG PO TABS: 1.0000 | ORAL_TABLET | Freq: Four times a day (QID) | ORAL | 0 refills | Status: DC | PRN

## 2017-05-20 NOTE — Progress Notes (Signed)
[facility] Visit  05/20/2017            Patient name: Angel RheaCheri Wisham MRN [id number]  Date of birth: [birth year]  CC & HPI:  [name] is a 37 y.o. female presenting today for a stabbing sensation that begins on the outside of her vagina that radiates inward, has been going on for 3 days. She has been taking Valtrex for maintenance and suppression for her Herpes. She says the last time she and her husband were intimate, he felt something inside her vagina. No alleviating factors were noted. She has not taken any medications for relief.    ROS:  ROS  (+) vaginal pain (-) pain (-) chills All systems are negative except as noted in the HPI and PMH.   Pertinent History Reviewed:   Reviewed: Significant for herpes simplex type 2, endometriosis, abdominal hysterectomy, tubal ligation Medical         Past Medical History:  Diagnosis Date  . Anxiety   . Chronic back pain   . Chronic headache   . Chronic pain   . Chronic pelvic pain in female   . Cocaine use   . Endometriosis   . Heart murmur    as a child  . Herpes genitalis in women   . Hx of opioid abuse   . Hypokalemia   . Narcotic abuse (HCC)    5 yrs ago lortab, off few yrs  . Pneumonia    37 years  old  . Renal disorder Cyst on Kidney                              Surgical Hx:    Past Surgical History:  Procedure Laterality Date  . ABDOMINAL HYSTERECTOMY  2007   left ovary remains  . CARPAL TUNNEL RELEASE     both  . CHOLECYSTECTOMY  2010  . INCISION AND DRAINAGE ABSCESS N/A 07/14/2014   Procedure: INCISION AND DRAINAGE VULVAR ABSCESS;  Surgeon: Tilda BurrowJohn Myquan Schaumburg V, MD;  Location: [facility];  Service: Gynecology;  Laterality: N/A;  . LAPAROSCOPIC SALPINGO OOPHERECTOMY Left 08/15/2014   Procedure: LAPAROSCOPIC LEFT SALPINGO OOPHORECTOMY;  Surgeon: Tilda BurrowJohn Drystan Reader V, MD;  Location: [facility];  Service: Gynecology;  Laterality: Left;  . TONSILLECTOMY    . TUBAL LIGATION     Medications: Reviewed & Updated - see associated  section                       Current Outpatient Medications:  .  acetaminophen (TYLENOL) 325 MG tablet, Take 325 mg by mouth as needed., Disp: , Rfl:  .  amphetamine-dextroamphetamine (ADDERALL) 20 MG tablet, Take 20 mg by mouth daily. , Disp: , Rfl:  .  estradiol (ESTRACE VAGINAL) 0.1 MG/GM vaginal cream, Place 1 Applicatorful vaginally at bedtime., Disp: 30 g, Rfl: 11 .  estradiol (ESTRACE) 2 MG tablet, Take 1 tablet (2 mg total) by mouth daily., Disp: 30 tablet, Rfl: 11 .  LORazepam (ATIVAN) 2 MG tablet, Take 1 mg by mouth at bedtime as needed for anxiety. , Disp: , Rfl:  .  pantoprazole (PROTONIX) 20 MG tablet, Take 1 tablet (20 mg total) by mouth daily., Disp: 30 tablet, Rfl: 0 .  valACYclovir (VALTREX) 1000 MG tablet, Take 1 tablet (1,000 mg total) by mouth 3 (three) times daily., Disp: 30 tablet, Rfl: 11 .  vortioxetine HBr (TRINTELLIX) 5 MG TABS tablet, Take 5 mg by  mouth daily., Disp: , Rfl:  .  oxyCODONE-acetaminophen (PERCOCET/ROXICET) 5-325 MG tablet, Take 1-2 tablets by mouth every 4 (four) hours as needed for moderate pain or severe pain (Postoperative pain). As needed for postoperative pain. May alternate with tramadol (Patient not taking: Reported on 05/20/2017), Disp: 10 tablet, Rfl: 0 .  predniSONE (DELTASONE) 20 MG tablet, Take 2 tablets (40 mg total) by mouth daily with breakfast. (Patient not taking: Reported on 05/20/2017), Disp: 10 tablet, Rfl: 0   Social History: Reviewed -  reports that she has been smoking cigarettes.  She has a 18.00 pack-year smoking history. she has never used smokeless tobacco.  Objective Findings:  Vitals: Blood pressure 110/60, pulse 96, height 5\' 1"  (1.549 m), weight 121 lb 6.4 oz (55.1 kg).  PHYSICAL EXAMINATION General appearance - alert, well appearing, and in no distress, oriented to person, place, and time and normal appearing weight Mental status - alert, oriented to person, place, and time, normal mood, behavior, speech, dress, motor  activity, and thought processes  PELVIC External genitalia - normal, clear Vulva - normal Vagina - normal, healthy appearing tissue, some tenderness at entrance and right side of vaginal wall upon palpation, right side has old chronic scarring from obstetric lacerations, no ulcerations where she is most tender Back wall support is good. Left side has 2 small 2 mm openings that are likely lubrication gland openings, do not look ulcerating. Top of vagina is very short Cervix - surgically absent Uterus - surgically absent Adnexa - left ovary present   Assessment & Plan:   A:  1.  questionable herpes outbreak despite suppression 2. Possible drug seeking behavior, as  PCP don Diego is out of town   P:  1.  Double Valtrex Rx to 1000mg /d for 5 days 2. Hydrocodone 7.5 for 5 day    By signing my name below, I, Izna Ahmed, attest that this documentation has been prepared under the direction and in the presence of [name], MD. Electronically Signed: Redge Gainer, Medical Scribe. 05/20/17. 2:22 PM.  I personally performed the services described in this documentation, which was SCRIBED in my presence. The recorded information has been reviewed and considered accurate. It has been edited as necessary during review. [name], MD

## 2017-05-20 NOTE — Telephone Encounter (Signed)
Pt says she is feeling a stabbing sensation in her vagina.  Pt says she is afraid it may be an outbreak.  Discussed with nurse and pt advised to come in today to be seen.  05-20-17  AS

## 2017-06-24 ENCOUNTER — Other Ambulatory Visit: Payer: Self-pay

## 2017-06-24 ENCOUNTER — Ambulatory Visit (INDEPENDENT_AMBULATORY_CARE_PROVIDER_SITE_OTHER): Payer: BLUE CROSS/BLUE SHIELD | Admitting: Obstetrics and Gynecology

## 2017-06-24 ENCOUNTER — Encounter: Payer: Self-pay | Admitting: Obstetrics and Gynecology

## 2017-06-24 VITALS — BP 104/62 | HR 98 | Ht 61.0 in | Wt 122.0 lb

## 2017-06-24 DIAGNOSIS — N94819 Vulvodynia, unspecified: Secondary | ICD-10-CM

## 2017-06-24 DIAGNOSIS — R300 Dysuria: Secondary | ICD-10-CM

## 2017-06-24 NOTE — Progress Notes (Signed)
Patient ID: [name], female   DOB: 1980/12/05, 37 y.o.   MRN: [id number]   [facility] Visit  @DATE @            Patient name: [name] MRN [id number]  Date of birth: 27-Oct-1980  CC & HPI:  Kyrin Garn is a 37 y.o. female presenting today for worsening, generalized, vaginal irritation and burning that started over a month ago. Associated symptoms include dysuria. She has tried lidocaine which will give her relief for about an hour. She has also tried Desitin with temporary relief as well. She has been taking Valtrex with no relief. The patient has tried Neruontin in the past, but states it made her feel, "out there." She was seen on 05/20/2017 for similar symptoms. The patient denies vaginal bleeding, fever, chills or any other symptoms or complaints at this time.  ROS:  ROS +vaginal irritation +dysuria -vaginal bleeding -fever -chills All systems are negative except as noted in the HPI and PMH.   Pertinent History Reviewed:   Reviewed: Significant for chronic pelvic pain, endometriosis, herpes genitalis, partial abdominal hysterectomy, opioid abuse, narcotic abuse  Medical         Past Medical History:  Diagnosis Date  . Anxiety   . Chronic back pain   . Chronic headache   . Chronic pain   . Chronic pelvic pain in female   . Cocaine use   . Endometriosis   . Heart murmur    as a child  . Herpes genitalis in women   . Hx of opioid abuse   . Hypokalemia   . Narcotic abuse (HCC)    5 yrs ago lortab, off few yrs  . Pneumonia    37 years  old  . Renal disorder Cyst on Kidney                              Surgical Hx:    Past Surgical History:  Procedure Laterality Date  . ABDOMINAL HYSTERECTOMY  2007   left ovary remains  . CARPAL TUNNEL RELEASE     both  . CHOLECYSTECTOMY  2010  . INCISION AND DRAINAGE ABSCESS N/A 07/14/2014   Procedure: INCISION AND DRAINAGE VULVAR ABSCESS;  Surgeon: [name], MD;  Location: [facility];  Service: Gynecology;  Laterality: N/A;   . LAPAROSCOPIC SALPINGO OOPHERECTOMY Left 08/15/2014   Procedure: LAPAROSCOPIC LEFT SALPINGO OOPHORECTOMY;  Surgeon: [name], MD;  Location: [facility];  Service: Gynecology;  Laterality: Left;  . TONSILLECTOMY    . TUBAL LIGATION     Medications: Reviewed & Updated - see associated section                       Current Outpatient Medications:  .  acetaminophen (TYLENOL) 325 MG tablet, Take 325 mg by mouth as needed., Disp: , Rfl:  .  amphetamine-dextroamphetamine (ADDERALL) 20 MG tablet, Take 20 mg by mouth daily. , Disp: , Rfl:  .  estradiol (ESTRACE VAGINAL) 0.1 MG/GM vaginal cream, Place 1 Applicatorful vaginally at bedtime., Disp: 30 g, Rfl: 11 .  estradiol (ESTRACE) 2 MG tablet, Take 1 tablet (2 mg total) by mouth daily., Disp: 30 tablet, Rfl: 11 .  LORazepam (ATIVAN) 2 MG tablet, Take 1 mg by mouth at bedtime as needed for anxiety. , Disp: , Rfl:  .  pantoprazole (PROTONIX) 20 MG tablet, Take 1 tablet (20 mg total) by mouth daily.,  Disp: 30 tablet, Rfl: 0 .  valACYclovir (VALTREX) 1000 MG tablet, Take 1 tablet (1,000 mg total) by mouth 3 (three) times daily., Disp: 30 tablet, Rfl: 11 .  vortioxetine HBr (TRINTELLIX) 5 MG TABS tablet, Take 5 mg by mouth daily., Disp: , Rfl:  .  HYDROcodone-acetaminophen (NORCO) 7.5-325 MG tablet, Take 1 tablet by mouth every 6 (six) hours as needed for moderate pain. (Patient not taking: Reported on 06/24/2017), Disp: 30 tablet, Rfl: 0 .  oxyCODONE-acetaminophen (PERCOCET/ROXICET) 5-325 MG tablet, Take 1-2 tablets by mouth every 4 (four) hours as needed for moderate pain or severe pain (Postoperative pain). As needed for postoperative pain. May alternate with tramadol (Patient not taking: Reported on 05/20/2017), Disp: 10 tablet, Rfl: 0   Social History: Reviewed -  reports that she has been smoking cigarettes.  She has a 18.00 pack-year smoking history. She has never used smokeless tobacco.  Objective Findings:  Vitals: Blood pressure 104/62, pulse  98, height 5\' 1"  (1.549 m), weight 122 lb (55.3 kg).  PHYSICAL EXAMINATION General appearance - alert, well appearing, and in no distress, oriented to person, place, and time and normal appearing weight Mental status - alert, oriented to person, place, and time, normal mood, behavior, speech, dress, motor activity, and thought processes, affect appropriate to mood  PELVIC External genitalia - no erythema on the left, slight erythema on the inner aspect of the right labia majora Vagina - normal and normal secretions Cervix - surgically absent Uterus - surgically absent  Assessment & Plan:   A:  1. Vulvodynia  2. ? Drug Seeking behavior  P:  1. F/u 4 weeks or PRN 2. Refill Rx Lidocaine  3. Given Vulvodynia: A Self Help Guide  4 will try to refer to vulvodynia clinic, as  I am not getting results.   By signing my name below, I, Diona BrownerJennifer Gorman, attest that this documentation has been prepared under the direction and in the presence of Tilda BurrowFerguson, Sarp Vernier V, MD. Electronically Signed: Diona BrownerJennifer Gorman, Medical Scribe. 06/24/17. 1:55 PM.  I personally performed the services described in this documentation, which was SCRIBED in my presence. The recorded information has been reviewed and considered accurate. It has been edited as necessary during review. Tilda BurrowJohn V Caryle Helgeson, MD

## 2017-07-22 ENCOUNTER — Ambulatory Visit: Payer: BLUE CROSS/BLUE SHIELD | Admitting: Obstetrics and Gynecology

## 2017-08-05 ENCOUNTER — Encounter (HOSPITAL_COMMUNITY): Payer: Self-pay | Admitting: Emergency Medicine

## 2017-08-05 ENCOUNTER — Inpatient Hospital Stay (HOSPITAL_COMMUNITY): Payer: BLUE CROSS/BLUE SHIELD

## 2017-08-05 ENCOUNTER — Inpatient Hospital Stay (HOSPITAL_COMMUNITY)
Admission: EM | Admit: 2017-08-05 | Discharge: 2017-08-11 | DRG: 690 | Disposition: A | Discharge status: Home / Self Care | Payer: BLUE CROSS/BLUE SHIELD | Attending: Family Medicine | Admitting: Family Medicine

## 2017-08-05 ENCOUNTER — Emergency Department (HOSPITAL_COMMUNITY): Payer: BLUE CROSS/BLUE SHIELD

## 2017-08-05 ENCOUNTER — Other Ambulatory Visit: Payer: Self-pay

## 2017-08-05 DIAGNOSIS — A6000 Herpesviral infection of urogenital system, unspecified: Secondary | ICD-10-CM | POA: Diagnosis present

## 2017-08-05 DIAGNOSIS — K8020 Calculus of gallbladder without cholecystitis without obstruction: Secondary | ICD-10-CM

## 2017-08-05 DIAGNOSIS — N136 Pyonephrosis: Secondary | ICD-10-CM | POA: Diagnosis not present

## 2017-08-05 DIAGNOSIS — N951 Menopausal and female climacteric states: Secondary | ICD-10-CM | POA: Diagnosis present

## 2017-08-05 DIAGNOSIS — R109 Unspecified abdominal pain: Secondary | ICD-10-CM | POA: Diagnosis not present

## 2017-08-05 DIAGNOSIS — Z9049 Acquired absence of other specified parts of digestive tract: Secondary | ICD-10-CM

## 2017-08-05 DIAGNOSIS — F411 Generalized anxiety disorder: Secondary | ICD-10-CM | POA: Diagnosis not present

## 2017-08-05 DIAGNOSIS — N390 Urinary tract infection, site not specified: Secondary | ICD-10-CM | POA: Diagnosis present

## 2017-08-05 DIAGNOSIS — F418 Other specified anxiety disorders: Secondary | ICD-10-CM

## 2017-08-05 DIAGNOSIS — F17210 Nicotine dependence, cigarettes, uncomplicated: Secondary | ICD-10-CM | POA: Diagnosis present

## 2017-08-05 DIAGNOSIS — F1110 Opioid abuse, uncomplicated: Secondary | ICD-10-CM | POA: Diagnosis present

## 2017-08-05 DIAGNOSIS — Z886 Allergy status to analgesic agent status: Secondary | ICD-10-CM | POA: Diagnosis not present

## 2017-08-05 DIAGNOSIS — G8929 Other chronic pain: Secondary | ICD-10-CM | POA: Diagnosis present

## 2017-08-05 DIAGNOSIS — D72829 Elevated white blood cell count, unspecified: Secondary | ICD-10-CM | POA: Diagnosis not present

## 2017-08-05 DIAGNOSIS — Z7982 Long term (current) use of aspirin: Secondary | ICD-10-CM

## 2017-08-05 DIAGNOSIS — R1031 Right lower quadrant pain: Secondary | ICD-10-CM

## 2017-08-05 DIAGNOSIS — E349 Endocrine disorder, unspecified: Secondary | ICD-10-CM

## 2017-08-05 DIAGNOSIS — N10 Acute tubulo-interstitial nephritis: Secondary | ICD-10-CM

## 2017-08-05 DIAGNOSIS — R10A1 Flank pain, right side: Secondary | ICD-10-CM | POA: Diagnosis present

## 2017-08-05 DIAGNOSIS — Z888 Allergy status to other drugs, medicaments and biological substances status: Secondary | ICD-10-CM

## 2017-08-05 DIAGNOSIS — Z79899 Other long term (current) drug therapy: Secondary | ICD-10-CM

## 2017-08-05 DIAGNOSIS — N1330 Unspecified hydronephrosis: Secondary | ICD-10-CM | POA: Diagnosis present

## 2017-08-05 LAB — URINALYSIS, ROUTINE W REFLEX MICROSCOPIC
Bilirubin Urine: NEGATIVE
Glucose, UA: NEGATIVE mg/dL
KETONES UR: NEGATIVE mg/dL
Nitrite: NEGATIVE
PH: 5 (ref 5.0–8.0)
Protein, ur: 30 mg/dL — AB
Specific Gravity, Urine: 1.006 (ref 1.005–1.030)
WBC, UA: >50 WBC/hpf — ABNORMAL HIGH (ref 0–5)

## 2017-08-05 LAB — CBC WITH DIFFERENTIAL/PLATELET
BASOS PCT: 0 %
Basophils Absolute: 0.1 10*3/uL (ref 0.0–0.1)
EOS ABS: 0.5 10*3/uL (ref 0.0–0.7)
EOS PCT: 3 %
HCT: 40 % (ref 36.0–46.0)
HEMOGLOBIN: 13.6 g/dL (ref 12.0–15.0)
LYMPHS ABS: 4 10*3/uL (ref 0.7–4.0)
Lymphocytes Relative: 23 %
MCH: 33.6 pg (ref 26.0–34.0)
MCHC: 34 g/dL (ref 30.0–36.0)
MCV: 98.8 fL (ref 78.0–100.0)
MONO ABS: 1.3 10*3/uL — AB (ref 0.1–1.0)
MONOS PCT: 8 %
Neutro Abs: 11.4 10*3/uL — ABNORMAL HIGH (ref 1.7–7.7)
Neutrophils Relative %: 66 %
Platelets: 246 10*3/uL (ref 150–400)
RBC: 4.05 MIL/uL (ref 3.87–5.11)
RDW: 11.4 % — AB (ref 11.5–15.5)
WBC: 17.3 10*3/uL — ABNORMAL HIGH (ref 4.0–10.5)

## 2017-08-05 LAB — POC URINE PREG, ED: PREG TEST UR: NEGATIVE

## 2017-08-05 LAB — RAPID URINE DRUG SCREEN, HOSP PERFORMED
AMPHETAMINES: NOT DETECTED
Barbiturates: NOT DETECTED
Benzodiazepines: POSITIVE — AB
Cocaine: NOT DETECTED
OPIATES: NOT DETECTED
TETRAHYDROCANNABINOL: NOT DETECTED

## 2017-08-05 LAB — COMPREHENSIVE METABOLIC PANEL
ALBUMIN: 3.6 g/dL (ref 3.5–5.0)
ALK PHOS: 100 U/L (ref 38–126)
ALT: 14 U/L (ref 14–54)
ANION GAP: 9 (ref 5–15)
AST: 17 U/L (ref 15–41)
BILIRUBIN TOTAL: 0.5 mg/dL (ref 0.3–1.2)
BUN: 7 mg/dL (ref 6–20)
CALCIUM: 8.9 mg/dL (ref 8.9–10.3)
CO2: 31 mmol/L (ref 22–32)
CREATININE: 0.56 mg/dL (ref 0.44–1.00)
Chloride: 99 mmol/L — ABNORMAL LOW (ref 101–111)
GFR calc Af Amer: >60 mL/min (ref >60)
GFR calc non Af Amer: >60 mL/min (ref >60)
GLUCOSE: 99 mg/dL (ref 65–99)
Potassium: 3.7 mmol/L (ref 3.5–5.1)
SODIUM: 139 mmol/L (ref 135–145)
TOTAL PROTEIN: 7 g/dL (ref 6.5–8.1)

## 2017-08-05 LAB — LIPASE, BLOOD: Lipase: 20 U/L (ref 11–51)

## 2017-08-05 MED ORDER — DOCUSATE SODIUM 100 MG PO CAPS
100.0000 mg | ORAL_CAPSULE | Freq: Two times a day (BID) | ORAL | Status: DC
  Administered 2017-08-05 – 2017-08-10 (×8): 100 mg via ORAL
  Filled 2017-08-05 (×9): qty 1

## 2017-08-05 MED ORDER — ONDANSETRON HCL 4 MG/2ML IJ SOLN
4.0000 mg | Freq: Four times a day (QID) | INTRAMUSCULAR | Status: DC | PRN
  Administered 2017-08-05 – 2017-08-06 (×5): 4 mg via INTRAVENOUS
  Filled 2017-08-05 (×6): qty 2

## 2017-08-05 MED ORDER — ACETAMINOPHEN 650 MG RE SUPP: 650.0000 mg | Freq: Four times a day (QID) | RECTAL | Status: DC | PRN

## 2017-08-05 MED ORDER — LORAZEPAM 0.5 MG PO TABS
0.5000 mg | ORAL_TABLET | Freq: Three times a day (TID) | ORAL | Status: DC | PRN
  Administered 2017-08-05 – 2017-08-10 (×13): 0.5 mg via ORAL
  Filled 2017-08-05 (×14): qty 1

## 2017-08-05 MED ORDER — MORPHINE SULFATE (PF) 4 MG/ML IV SOLN
4.0000 mg | INTRAVENOUS | Status: DC | PRN
Start: 2017-08-05 — End: 2017-08-05
  Administered 2017-08-05: 4 mg via INTRAVENOUS
  Filled 2017-08-05: qty 1

## 2017-08-05 MED ORDER — VORTIOXETINE HBR 5 MG PO TABS
5.0000 mg | ORAL_TABLET | Freq: Every day | ORAL | Status: DC
  Administered 2017-08-05 – 2017-08-10 (×6): 5 mg via ORAL
  Filled 2017-08-05 (×8): qty 1

## 2017-08-05 MED ORDER — SODIUM CHLORIDE 0.9 % IV SOLN
1.0000 g | Freq: Once | INTRAVENOUS | Status: AC
  Administered 2017-08-05: 1 g via INTRAVENOUS
  Filled 2017-08-05: qty 10

## 2017-08-05 MED ORDER — ONDANSETRON HCL 4 MG/2ML IJ SOLN
4.0000 mg | Freq: Once | INTRAMUSCULAR | Status: AC
  Administered 2017-08-05: 4 mg via INTRAVENOUS
  Filled 2017-08-05: qty 2

## 2017-08-05 MED ORDER — MORPHINE SULFATE (PF) 4 MG/ML IV SOLN
4.0000 mg | Freq: Once | INTRAVENOUS | Status: AC
  Administered 2017-08-05: 4 mg via INTRAVENOUS
  Filled 2017-08-05: qty 1

## 2017-08-05 MED ORDER — ACETAMINOPHEN 325 MG PO TABS
650.0000 mg | ORAL_TABLET | Freq: Four times a day (QID) | ORAL | Status: DC | PRN
  Administered 2017-08-07 (×2): 650 mg via ORAL
  Filled 2017-08-05 (×3): qty 2

## 2017-08-05 MED ORDER — SODIUM CHLORIDE 0.9 % IV BOLUS
1000.0000 mL | Freq: Once | INTRAVENOUS | Status: AC
  Administered 2017-08-05: 1000 mL via INTRAVENOUS

## 2017-08-05 MED ORDER — NICOTINE 14 MG/24HR TD PT24
14.0000 mg | MEDICATED_PATCH | Freq: Every day | TRANSDERMAL | Status: DC
  Administered 2017-08-05 – 2017-08-10 (×6): 14 mg via TRANSDERMAL
  Filled 2017-08-05 (×6): qty 1

## 2017-08-05 MED ORDER — LEVOFLOXACIN IN D5W 750 MG/150ML IV SOLN
750.0000 mg | INTRAVENOUS | Status: DC
  Administered 2017-08-05 – 2017-08-07 (×3): 750 mg via INTRAVENOUS
  Filled 2017-08-05 (×3): qty 150

## 2017-08-05 MED ORDER — ENOXAPARIN SODIUM 40 MG/0.4ML ~~LOC~~ SOLN
40.0000 mg | SUBCUTANEOUS | Status: DC
  Administered 2017-08-05 – 2017-08-08 (×3): 40 mg via SUBCUTANEOUS
  Filled 2017-08-05 (×5): qty 0.4

## 2017-08-05 MED ORDER — ONDANSETRON HCL 4 MG PO TABS
4.0000 mg | ORAL_TABLET | Freq: Four times a day (QID) | ORAL | Status: DC | PRN
  Administered 2017-08-07 – 2017-08-09 (×3): 4 mg via ORAL
  Filled 2017-08-05 (×3): qty 1

## 2017-08-05 MED ORDER — ONDANSETRON HCL 4 MG/2ML IJ SOLN
4.0000 mg | INTRAMUSCULAR | Status: DC | PRN
  Filled 2017-08-05 (×2): qty 2

## 2017-08-05 MED ORDER — OXYCODONE HCL 5 MG PO TABS
5.0000 mg | ORAL_TABLET | ORAL | Status: DC | PRN
  Administered 2017-08-05 – 2017-08-11 (×25): 5 mg via ORAL
  Filled 2017-08-05 (×28): qty 1

## 2017-08-05 MED ORDER — HYDROMORPHONE HCL 1 MG/ML IJ SOLN
0.5000 mg | INTRAMUSCULAR | Status: DC | PRN
  Administered 2017-08-05: 1 mg via INTRAVENOUS
  Administered 2017-08-05 – 2017-08-06 (×10): 0.5 mg via INTRAVENOUS
  Administered 2017-08-07 (×5): 1 mg via INTRAVENOUS
  Filled 2017-08-05: qty 1
  Filled 2017-08-05 (×6): qty 0.5
  Filled 2017-08-05 (×2): qty 1
  Filled 2017-08-05: qty 0.5
  Filled 2017-08-05: qty 1
  Filled 2017-08-05 (×2): qty 0.5
  Filled 2017-08-05 (×3): qty 1

## 2017-08-05 MED ORDER — SODIUM CHLORIDE 0.9 % IV SOLN
INTRAVENOUS | Status: DC
  Administered 2017-08-05 – 2017-08-10 (×7): via INTRAVENOUS

## 2017-08-05 NOTE — ED Triage Notes (Signed)
Pt c/o right flank/back pain intermittently since Friday.

## 2017-08-05 NOTE — ED Provider Notes (Signed)
Pt received at sign out with CT pending. Pt c/o flank pain, nausea, dysuria. No fever in ED. +UTI, UC pending. CT without urinary calculus, possible pyelonephritis. Given pt's continued c/o pain and nausea, as well as elevated WBC count; will admit. 0925: T/C returned from [facility] Laural Benes, case discussed, including:  HPI, pertinent PM/SHx, VS/PE, dx testing, ED course and treatment:  Agreeable to admit.     Patient Vitals for the past 24 hrs:  BP Temp Pulse Resp SpO2 Height Weight  08/05/17 0900 (!) 92/40 - (!) 59 - 100 % - -  08/05/17 0845 98/61 - (!) 101 - (!) 78 % - -  08/05/17 0730 105/71 - (!) 57 - 100 % - -  08/05/17 0700 113/80 - (!) 57 - 98 % - -  08/05/17 0645 113/65 - (!) 59 18 100 % - -  08/05/17 0353 121/81 98.3 F (36.8 C) 82 18 100 %  (1.549 m) 54.4 kg (120 lb)    . Results for orders placed or performed during the hospital encounter of 08/05/17  Comprehensive metabolic panel  Result Value Ref Range   Sodium 139 135 - 145 mmol/L   Potassium 3.7 3.5 - 5.1 mmol/L   Chloride 99 (L) 101 - 111 mmol/L   CO2 31 22 - 32 mmol/L   Glucose, Bld 99 65 - 99 mg/dL   BUN 7 6 - 20 mg/dL   Creatinine, Ser 1.61 0.44 - 1.00 mg/dL   Calcium 8.9 8.9 - 09.6 mg/dL   Total Protein 7.0 6.5 - 8.1 g/dL   Albumin 3.6 3.5 - 5.0 g/dL   AST 17 15 - 41 U/L   ALT 14 14 - 54 U/L   Alkaline Phosphatase 100 38 - 126 U/L   Total Bilirubin 0.5 0.3 - 1.2 mg/dL   GFR calc non Af Amer >60 >60 mL/min   GFR calc Af Amer >60 >60 mL/min   Anion gap 9 5 - 15  Lipase, blood  Result Value Ref Range   Lipase 20 11 - 51 U/L  CBC with Differential  Result Value Ref Range   WBC 17.3 (H) 4.0 - 10.5 K/uL   RBC 4.05 3.87 - 5.11 MIL/uL   Hemoglobin 13.6 12.0 - 15.0 g/dL   HCT 04.5 40.9 - 81.1 %   MCV 98.8 78.0 - 100.0 fL   MCH 33.6 26.0 - 34.0 pg   MCHC 34.0 30.0 - 36.0 g/dL   RDW 91.4 (L) 78.2 - 95.6 %   Platelets 246 150 - 400 K/uL   Neutrophils Relative % 66 %   Neutro Abs 11.4 (H) 1.7 - 7.7 K/uL    Lymphocytes Relative 23 %   Lymphs Abs 4.0 0.7 - 4.0 K/uL   Monocytes Relative 8 %   Monocytes Absolute 1.3 (H) 0.1 - 1.0 K/uL   Eosinophils Relative 3 %   Eosinophils Absolute 0.5 0.0 - 0.7 K/uL   Basophils Relative 0 %   Basophils Absolute 0.1 0.0 - 0.1 K/uL  Urinalysis, Routine w reflex microscopic  Result Value Ref Range   Color, Urine YELLOW YELLOW   APPearance HAZY (A) CLEAR   Specific Gravity, Urine 1.006 1.005 - 1.030   pH 5.0 5.0 - 8.0   Glucose, UA NEGATIVE NEGATIVE mg/dL   Hgb urine dipstick MODERATE (A) NEGATIVE   Bilirubin Urine NEGATIVE NEGATIVE   Ketones, ur NEGATIVE NEGATIVE mg/dL   Protein, ur 30 (A) NEGATIVE mg/dL   Nitrite NEGATIVE NEGATIVE   Leukocytes, UA  SMALL (A) NEGATIVE   RBC / HPF 11-20 0 - 5 RBC/hpf   WBC, UA >50 (H) 0 - 5 WBC/hpf   Bacteria, UA FEW (A) NONE SEEN   Squamous Epithelial / LPF 0-5 0 - 5   Mucus PRESENT   POC urine preg, ED  Result Value Ref Range   Preg Test, Ur NEGATIVE NEGATIVE   Ct Renal Stone Study Result Date: 08/05/2017 CLINICAL DATA:  Right flank region pain EXAM: CT ABDOMEN AND PELVIS WITHOUT CONTRAST TECHNIQUE: Multidetector CT imaging of the abdomen and pelvis was performed following the standard protocol without oral or IV contrast. COMPARISON:  March 24, 2017 FINDINGS: Lower chest: Lung bases are clear. Hepatobiliary: There is apparent fatty infiltration near the fissure for the ligamentum teres. No liver lesions beyond apparent fatty infiltration noted on this noncontrast enhanced study. Gallbladder is absent. There is no biliary duct dilatation. Pancreas: There is no pancreatic mass or inflammatory focus. Spleen: No splenic lesions are evident. Adrenals/Urinary Tract: Adrenals bilaterally appear normal. The right kidney appears edematous. There is no renal mass on either side. There is moderate hydronephrosis on the right. There is no hydronephrosis on the left. There is no intrarenal calculus on either side. There is edema  along the course of much of the right ureter. No ureteral calculi are evident on either side. Urinary bladder is midline with wall thickness within normal limits. Stomach/Bowel: There is no appreciable bowel wall or mesenteric thickening. No bowel obstruction. No free air or portal venous air. Vascular/Lymphatic: There is no abdominal aortic aneurysm. No vascular lesions are evident on this study. There is no adenopathy in the abdomen or pelvis. Reproductive: Uterus is absent.  No evident pelvic mass. Other: There are calcifications in right lower quadrant, stable. Appendix appears unremarkable. There is no periappendiceal region inflammation. There is no abscess or ascites in the abdomen or pelvis. There is a minimal ventral hernia containing only fat. Musculoskeletal: There are no blastic or lytic bone lesions. There is no intramuscular or abdominal wall lesion. IMPRESSION: 1. Right kidney is edematous. There is moderate hydronephrosis on the right. There is edema involving much of the right ureter. No ureteral calculus is appreciable on this study. Question recent calculus passage. Pyelonephritis is a differential consideration. No renal abscess or localized lobar nephronia evident by CT. 2. No bowel obstruction. No abscess. No appendiceal region inflammation. Stable small calcifications in the right lower quadrant, separate from the appendix. 3. Apparent fatty infiltration in the liver near the fissure for the ligamentum teres, also present on prior study. Gallbladder absent. 4.  Uterus absent. Electronically Signed   By: Bretta Bang III M.D.   On: 08/05/2017 09:01       Samuel Jester, DO 08/05/17 [phone]

## 2017-08-05 NOTE — ED Provider Notes (Signed)
[facility] EMERGENCY DEPARTMENT Provider Note   CSN: [id number] Arrival date & time: 08/05/17  4098     History   Chief Complaint Chief Complaint  Patient presents with  . Flank Pain    HPI [name] is a 37 y.o. female.  The history is provided by the patient.  Flank Pain   She complains of pain in the right mid and lower abdomen for the last 5 days.  Pain does radiate around to the right flank.  Pain is severe and she rates it at 10/10.  It has been getting worse.  Pain is worse with coughing.  There is been nausea but no vomiting.  She denies fever, chills, sweats.  She has had some slight dysuria but no urinary urgency or frequency or tenesmus.  She is status post cholecystectomy.  Pain is similar to what she has had with ovarian cysts in the past.  Her past history does include opioid abuse, cocaine abuse.  Past Medical History:  Diagnosis Date  . Anxiety   . Chronic back pain   . Chronic headache   . Chronic pain   . Chronic pelvic pain in female   . Cocaine use   . Endometriosis   . Heart murmur    as a child  . Herpes genitalis in women   . Hx of opioid abuse   . Hypokalemia   . Narcotic abuse (HCC)    5 yrs ago lortab, off few yrs  . Pneumonia    37 years  old  . Renal disorder Cyst on Kidney    Patient Active Problem List   Diagnosis Date Noted  . 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 Condyloma vulva 08/02/2015  . Herpes simplex type II infection 07/27/2015  . Herpes simplex of female genitalia 07/25/2015  . Depression with anxiety 11/10/2014  . Insomnia 11/10/2014  . Chronic pelvic pain in female 08/15/2014  . Vulvar abscess left labia majora 07/14/2014  . Other and unspecified ovarian cyst 09/16/2013  . Abdominal pain, left lower quadrant 09/16/2013  . LBP (low back pain) 12/16/2012  . Pelvic and perineal pain 10/25/2012  . Shoulder pain 03/17/2012  . Colitis 01/16/2012  . Abdominal pain 01/16/2012  . Cocaine abuse (HCC) 09/28/2011  .  Dental decay 09/28/2011  . Hormonal disorder 09/28/2011  . Hypokalemia   . Narcotic abuse [facility])     Past Surgical History:  Procedure Laterality Date  . ABDOMINAL HYSTERECTOMY  2007   left ovary remains  . CARPAL TUNNEL RELEASE     both  . CHOLECYSTECTOMY  2010  . INCISION AND DRAINAGE ABSCESS N/A 07/14/2014   Procedure: INCISION AND DRAINAGE VULVAR ABSCESS;  Surgeon: [name], MD;  Location: [facility];  Service: Gynecology;  Laterality: N/A;  . LAPAROSCOPIC SALPINGO OOPHERECTOMY Left 08/15/2014   Procedure: LAPAROSCOPIC LEFT SALPINGO OOPHORECTOMY;  Surgeon: [name], MD;  Location: [facility];  Service: Gynecology;  Laterality: Left;  . TONSILLECTOMY    . TUBAL LIGATION       OB History    Gravida  4   Para  4   Term  4   Preterm      AB      Living  4     SAB      TAB      Ectopic      Multiple      Live Births               Home Medications  Prior to Admission medications   Medication Sig Start Date End Date Taking? Authorizing Provider  acetaminophen (TYLENOL) 325 MG tablet Take 325 mg by mouth as needed.    [provider]  amphetamine-dextroamphetamine (ADDERALL) 20 MG tablet Take 20 mg by mouth daily.     [provider]  estradiol (ESTRACE VAGINAL) 0.1 MG/GM vaginal cream Place 1 Applicatorful vaginally at bedtime. 03/31/17   Lazaro Arms, MD  estradiol (ESTRACE) 2 MG tablet Take 1 tablet (2 mg total) by mouth daily. 03/31/17   Lazaro Arms, MD  HYDROcodone-acetaminophen (NORCO) 7.5-325 MG tablet Take 1 tablet by mouth every 6 (six) hours as needed for moderate pain. Patient not taking: Reported on 06/24/2017 05/20/17   [name], MD  LORazepam (ATIVAN) 2 MG tablet Take 1 mg by mouth at bedtime as needed for anxiety.     [provider]  oxyCODONE-acetaminophen (PERCOCET/ROXICET) 5-325 MG tablet Take 1-2 tablets by mouth every 4 (four) hours as needed for moderate pain or severe pain (Postoperative pain). As  needed for postoperative pain. May alternate with tramadol Patient not taking: Reported on 05/20/2017 04/08/17   [name], MD  pantoprazole (PROTONIX) 20 MG tablet Take 1 tablet (20 mg total) by mouth daily. 03/24/17   [name], MD  valACYclovir (VALTREX) 1000 MG tablet Take 1 tablet (1,000 mg total) by mouth 3 (three) times daily. 02/10/17   Lazaro Arms, MD  vortioxetine HBr (TRINTELLIX) 5 MG TABS tablet Take 5 mg by mouth daily.    [provider]  POTASSIUM PO Take 1 tablet by mouth daily.    05/16/11  [provider]    Family History Family History  Problem Relation Age of Onset  . Stroke Father   . Diverticulitis Father   . Alzheimer's disease Father   . Hypertension Mother   . Diabetes Mother   . Heart disease Unknown   . Arthritis Unknown   . Diabetes Unknown   . Kidney disease Unknown   . Heart attack Paternal Grandfather   . Alzheimer's disease Paternal Grandmother   . Heart attack Maternal Grandfather     Social History Social History   Tobacco Use  . Smoking status: Current Every Day Smoker    Packs/day: 1.00    Years: 18.00    Pack years: 18.00    Types: Cigarettes  . Smokeless tobacco: Never Used  Substance Use Topics  . Alcohol use: No    Alcohol/week: 0.0 oz  . Drug use: No    Types: Cocaine    Comment: no drugs for 3 years 05/2014     Allergies   Fentanyl; Lyrica [pregabalin]; Nsaids; Tramadol; Divalproex sodium; Fioricet-codeine [butalbital-apap-caff-cod]; Ibuprofen; and Ketorolac tromethamine   Review of Systems Review of Systems  Genitourinary: Positive for flank pain.  All other systems reviewed and are negative.    Physical Exam Updated Vital Signs BP 113/65 (BP Location: Right Arm)   Pulse (!) 59   Temp 98.3 F (36.8 C)   Resp 18   Ht  (1.549 m)   Wt 54.4 kg (120 lb)   SpO2 100%   BMI 22.67 kg/m   Physical Exam  Nursing note and vitals reviewed.  37 year old female, appears uncomfortable,  but is in no acute distress. Vital signs are normal. Oxygen saturation is 100%, which is normal. Head is normocephalic and atraumatic. PERRLA, EOMI. Oropharynx is clear. Neck is nontender and supple without adenopathy or JVD. Back is nontender and  there is no CVA tenderness. Lungs are clear without rales, wheezes, or rhonchi. Chest is nontender. Heart has regular rate and rhythm without murmur. Abdomen is soft, with moderate to severe tenderness in the right mid and lower abdomen.  There is no rebound or guarding.  There are no masses or hepatosplenomegaly and peristalsis is hypoactive. Extremities have no cyanosis or edema, full range of motion is present. Skin is warm and dry without rash. Neurologic: Mental status is normal, cranial nerves are intact, there are no motor or sensory deficits.  ED Treatments / Results  Labs (all labs ordered are listed, but only abnormal results are displayed) Labs Reviewed  COMPREHENSIVE METABOLIC PANEL - Abnormal; Notable for the following components:      Result Value   Chloride 99 (*)    All other components within normal limits  CBC WITH DIFFERENTIAL/PLATELET - Abnormal; Notable for the following components:   WBC 17.3 (*)    RDW 11.4 (*)    Neutro Abs 11.4 (*)    Monocytes Absolute 1.3 (*)    All other components within normal limits  URINALYSIS, ROUTINE W REFLEX MICROSCOPIC - Abnormal; Notable for the following components:   APPearance HAZY (*)    Hgb urine dipstick MODERATE (*)    Protein, ur 30 (*)    Leukocytes, UA SMALL (*)    WBC, UA >50 (*)    Bacteria, UA FEW (*)    All other components within normal limits  URINE CULTURE  LIPASE, BLOOD  POC URINE PREG, ED    Radiology No results found.  Procedures Procedures (including critical care time)  Medications Ordered in ED Medications  sodium chloride 0.9 % bolus 1,000 mL (1,000 mLs Intravenous New Bag/Given 08/05/17 0725)  morphine 4 MG/ML injection 4 mg (4 mg Intravenous Given  08/05/17 0725)  ondansetron (ZOFRAN) injection 4 mg (4 mg Intravenous Given 08/05/17 0725)     Initial Impression / Assessment and Plan / ED Course  I have reviewed the triage vital signs and the nursing notes.  Pertinent labs & imaging results that were available during my care of the patient were reviewed by me and considered in my medical decision making (see chart for details).  Right lower quadrant and flank pain.  Possible pyelonephritis, but patient is not febrile.  Possible urolithiasis.  Possible appendicitis.  Possible ovarian cyst.  Possible diverticulitis.  Possible pancreatitis.  Old records are reviewed, and she had an ED visit in January with right-sided abdominal pain and negative CT scan.  Also prior ED visits for chronic abdominal pain and chronic pelvic pain.  Will check screening labs and sent for CT renal stone protocol.  Screening labs are significant only for moderate leukocytosis without left shift.  CT is pending.  Case is signed out to Dr. Clarene Duke.  Final Clinical Impressions(s) / ED Diagnoses   Final diagnoses:  RLQ abdominal pain    ED Discharge Orders    None       Dione Booze, MD 08/05/17 [phone]

## 2017-08-05 NOTE — H&P (Signed)
History and Physical  [name] ZOX:[id number] DOB: 30-Aug-1980 DOA: 08/05/2017  Referring physician: Clarene Woods PCP: Angel Linsey, MD   Chief Complaint: Right flank pain  HPI: [name] is a 37 y.o. female presented to the emergency department complaining of 5 days of progressive right mid and lower abdominal pain and right flank pain.  The patient has had severe pain 10/10.  The patient says that the pain is been getting worse.  She was worried about a kidney stone.  The patient denies blood in the urine.  She has been having nausea but no vomiting.  She denies fever and chills.  She denies diarrhea.  The patient reports that she has had burning with urination.  The patient reports that she has not been able to eat but she has been drinking liquids.  The patient reports overall feeling malaise.  Her main complaint of the abdominal pain and right flank pain.  ED course: The patient was seen in the emergency department and found to have an abnormal urinalysis consistent with infection.  In addition, the patient ended up having a CT stone study that remarked and edematous right kidney that was suspicious for pyelonephritis.  No kidney stone was seen in this study.  There was notation of a moderate right hydronephrosis.  The patient had a mild leukocytosis.  The patient was not febrile and had a normal blood pressure.  Given the leukocytosis and abnormal urinalysis she is being treated for presumed right pyelonephritis.  Admission was requested for IV antibiotic therapy.   Review of Systems: All systems reviewed and apart from history of presenting illness, are negative.  Past Medical History:  Diagnosis Date  . Anxiety   . Chronic back pain   . Chronic headache   . Chronic pain   . Chronic pelvic pain in female   . Cocaine use   . Endometriosis   . Heart murmur    as a child  . Herpes genitalis in women   . Hx of opioid abuse   . Hypokalemia   . Narcotic abuse (HCC)    5 yrs ago  lortab, off few yrs  . Pneumonia    37 years  old  . Renal disorder Cyst on Kidney   Past Surgical History:  Procedure Laterality Date  . ABDOMINAL HYSTERECTOMY  2007   left ovary remains  . CARPAL TUNNEL RELEASE     both  . CHOLECYSTECTOMY  2010  . INCISION AND DRAINAGE ABSCESS N/A 07/14/2014   Procedure: INCISION AND DRAINAGE VULVAR ABSCESS;  Surgeon: Angel Burrow, MD;  Location: [facility];  Service: Gynecology;  Laterality: N/A;  . LAPAROSCOPIC SALPINGO OOPHERECTOMY Left 08/15/2014   Procedure: LAPAROSCOPIC LEFT SALPINGO OOPHORECTOMY;  Surgeon: Angel Burrow, MD;  Location: [facility];  Service: Gynecology;  Laterality: Left;  . TONSILLECTOMY    . TUBAL LIGATION     Social History:  reports that she has been smoking cigarettes.  She has a 18.00 pack-year smoking history. She has never used smokeless tobacco. She reports that she does not drink alcohol or use drugs.  Allergies  Allergen Reactions  . Fentanyl Itching  . Lyrica [Pregabalin] Other (See Comments)    Causes her to pass out  . Morphine And Related Itching  . Nsaids Other (See Comments)    GI upset  . Tramadol Other (See Comments)    Seizures   . Divalproex Sodium Anxiety and Other (See Comments)    'makes me feel really weird' ,  felt distant from reality  . Fioricet-Codeine [Butalbital-Apap-Caff-Cod] Itching  . Ibuprofen Other (See Comments)    GI upset, stomach pain  . Ketorolac Tromethamine Hives and Other (See Comments)    Stomach pain     Family History  Problem Relation Age of Onset  . Stroke Father   . Diverticulitis Father   . Alzheimer's disease Father   . Hypertension Mother   . Diabetes Mother   . Heart disease Unknown   . Arthritis Unknown   . Diabetes Unknown   . Kidney disease Unknown   . Heart attack Paternal Grandfather   . Alzheimer's disease Paternal Grandmother   . Heart attack Maternal Grandfather     Prior to Admission medications   Medication Sig Start Date End Date Taking?  Authorizing Provider  amphetamine-dextroamphetamine (ADDERALL) 20 MG tablet Take 20 mg by mouth daily.    Yes [provider]  aspirin-acetaminophen-caffeine (EXCEDRIN MIGRAINE) [phone] MG tablet Take 2 tablets by mouth every 6 (six) hours as needed for headache or migraine.   Yes [provider]  diphenhydrAMINE (BENADRYL) 25 mg capsule Take 25 mg by mouth every 6 (six) hours as needed for allergies.   Yes [provider]  estradiol (ESTRACE VAGINAL) 0.1 MG/GM vaginal cream Place 1 Applicatorful vaginally at bedtime. 03/31/17  Yes Lazaro Arms, MD  LORazepam (ATIVAN) 2 MG tablet Take 1 mg by mouth at bedtime as needed for anxiety.    Yes [provider]  oxyCODONE-acetaminophen (PERCOCET/ROXICET) 5-325 MG tablet Take 1-2 tablets by mouth every 4 (four) hours as needed for moderate pain or severe pain (Postoperative pain). As needed for postoperative pain. May alternate with tramadol 04/08/17  Yes Angel Burrow, MD  vortioxetine HBr (TRINTELLIX) 5 MG TABS tablet Take 5 mg by mouth daily.   Yes [provider]  POTASSIUM PO Take 1 tablet by mouth daily.    05/16/11  [provider]   Physical Exam: Vitals:   08/05/17 0730 08/05/17 0845 08/05/17 0900 08/05/17 0930  BP: 105/71 98/61 (!) 92/40 104/69  Pulse: (!) 57 (!) 101 (!) 59 (!) 53  Resp:      Temp:      SpO2: 100% (!) 78% 100% 99%  Weight:      Height:         General exam: Moderately built and nourished patient, lying comfortably supine on the gurney in no obvious distress.  Head, eyes and ENT: Nontraumatic and normocephalic. Pupils equally reacting to light and accommodation. Oral mucosa moist.  Poor dentition.  Neck: Supple. No JVD, carotid bruit or thyromegaly.  Lymphatics: No lymphadenopathy.  Respiratory system: Clear to auscultation. No increased work of breathing.  Cardiovascular system: S1 and S2 heard, RRR. No JVD, murmurs, gallops, clicks or pedal  edema.  Gastrointestinal system: Abdomen is nondistended, soft and tenderness of the right lower quadrant but no guarding.  Right flank pain.  Positive right CVA tenderness. Normal bowel sounds heard. No organomegaly or masses appreciated.  Central nervous system: Alert and oriented. No focal neurological deficits.  Extremities: Symmetric 5 x 5 power. Peripheral pulses symmetrically felt.   Skin: No rashes or acute findings.  Musculoskeletal system: Negative exam.  Psychiatry: Pleasant and cooperative.  Labs on Admission:  Basic Metabolic Panel: Recent Labs  Lab 08/05/17 0709  NA 139  K 3.7  CL 99*  CO2 31  GLUCOSE 99  BUN 7  CREATININE 0.56  CALCIUM 8.9   Liver Function Tests: Recent Labs  Lab 08/05/17  0709  AST 17  ALT 14  ALKPHOS 100  BILITOT 0.5  PROT 7.0  ALBUMIN 3.6   Recent Labs  Lab 08/05/17 0709  LIPASE 20   No results for input(s): AMMONIA in the last 168 hours. CBC: Recent Labs  Lab 08/05/17 0709  WBC 17.3*  NEUTROABS 11.4*  HGB 13.6  HCT 40.0  MCV 98.8  PLT 246   Cardiac Enzymes: No results for input(s): CKTOTAL, CKMB, CKMBINDEX, TROPONINI in the last 168 hours.  BNP (last 3 results) No results for input(s): PROBNP in the last 8760 hours. CBG: No results for input(s): GLUCAP in the last 168 hours.  Radiological Exams on Admission: Ct Renal Stone Study  Result Date: 08/05/2017 CLINICAL DATA:  Right flank region pain EXAM: CT ABDOMEN AND PELVIS WITHOUT CONTRAST TECHNIQUE: Multidetector CT imaging of the abdomen and pelvis was performed following the standard protocol without oral or IV contrast. COMPARISON:  March 24, 2017 FINDINGS: Lower chest: Lung bases are clear. Hepatobiliary: There is apparent fatty infiltration near the fissure for the ligamentum teres. No liver lesions beyond apparent fatty infiltration noted on this noncontrast enhanced study. Gallbladder is absent. There is no biliary duct dilatation. Pancreas: There is no  pancreatic mass or inflammatory focus. Spleen: No splenic lesions are evident. Adrenals/Urinary Tract: Adrenals bilaterally appear normal. The right kidney appears edematous. There is no renal mass on either side. There is moderate hydronephrosis on the right. There is no hydronephrosis on the left. There is no intrarenal calculus on either side. There is edema along the course of much of the right ureter. No ureteral calculi are evident on either side. Urinary bladder is midline with wall thickness within normal limits. Stomach/Bowel: There is no appreciable bowel wall or mesenteric thickening. No bowel obstruction. No free air or portal venous air. Vascular/Lymphatic: There is no abdominal aortic aneurysm. No vascular lesions are evident on this study. There is no adenopathy in the abdomen or pelvis. Reproductive: Uterus is absent.  No evident pelvic mass. Other: There are calcifications in right lower quadrant, stable. Appendix appears unremarkable. There is no periappendiceal region inflammation. There is no abscess or ascites in the abdomen or pelvis. There is a minimal ventral hernia containing only fat. Musculoskeletal: There are no blastic or lytic bone lesions. There is no intramuscular or abdominal wall lesion. IMPRESSION: 1. Right kidney is edematous. There is moderate hydronephrosis on the right. There is edema involving much of the right ureter. No ureteral calculus is appreciable on this study. Question recent calculus passage. Pyelonephritis is a differential consideration. No renal abscess or localized lobar nephronia evident by CT. 2. No bowel obstruction. No abscess. No appendiceal region inflammation. Stable small calcifications in the right lower quadrant, separate from the appendix. 3. Apparent fatty infiltration in the liver near the fissure for the ligamentum teres, also present on prior study. Gallbladder absent. 4.  Uterus absent. Electronically Signed   By: Bretta Bang III M.D.   On:  08/05/2017 09:01   Assessment/Plan Principal Problem:   Acute pyelonephritis Active Problems:   Right flank pain   Moderate Hydronephrosis of right kidney   Leukocytosis   UTI (urinary tract infection)   History of Narcotic abuse   Generalized anxiety disorder   1. Acute pyelonephritis of the right kidney- we are presuming this given the abnormal urinalysis, no stone seen on CT, treating with IV antibiotics, follow cultures, supportive care with IV fluids and pain management. 2. Moderate right hydronephrosis- treating pyelonephritis as above, ordered for  renal ultrasound tomorrow morning to reassess hydronephrosis, if persists may need urology consultation. 3. Leukocytosis- likely secondary to pyelonephritis, treating as noted above, plan to repeat CBC with differential in the morning. 4. Severe right flank pain- IV pain medication ordered. 5. History of opioid abuse and polysubstance abuse- urine drug screen pending, she does have a legitimate reason to have pain at this time and treating with IV opioids for now, monitor usage. 6. Generalized anxiety disorder- low-dose lorazepam ordered as needed, hold for sedation.   DVT Prophylaxis: Lovenox Code Status: Full Family Communication: Patient Disposition Plan: Inpatient treatment with IV antibiotics required  Time spent: 58 minutes  Standley Dakins, MD [facility] Pager [phone]  If 7PM-7AM, please contact night-coverage [email or web address] Password [email or web address] 08/05/2017, 10:09 AM

## 2017-08-06 ENCOUNTER — Inpatient Hospital Stay (HOSPITAL_COMMUNITY): Payer: BLUE CROSS/BLUE SHIELD

## 2017-08-06 LAB — CBC WITH DIFFERENTIAL/PLATELET
Basophils Absolute: 0.1 10*3/uL (ref 0.0–0.1)
Basophils Relative: 1 %
Eosinophils Absolute: 0.5 10*3/uL (ref 0.0–0.7)
Eosinophils Relative: 4 %
HEMATOCRIT: 38.4 % (ref 36.0–46.0)
HEMOGLOBIN: 12.5 g/dL (ref 12.0–15.0)
LYMPHS ABS: 3 10*3/uL (ref 0.7–4.0)
LYMPHS PCT: 26 %
MCH: 33.1 pg (ref 26.0–34.0)
MCHC: 32.6 g/dL (ref 30.0–36.0)
MCV: 101.6 fL — ABNORMAL HIGH (ref 78.0–100.0)
Monocytes Absolute: 1.4 10*3/uL — ABNORMAL HIGH (ref 0.1–1.0)
Monocytes Relative: 12 %
NEUTROS ABS: 6.7 10*3/uL (ref 1.7–7.7)
Neutrophils Relative %: 57 %
Platelets: 225 10*3/uL (ref 150–400)
RBC: 3.78 MIL/uL — AB (ref 3.87–5.11)
RDW: 12.2 % (ref 11.5–15.5)
WBC: 11.6 10*3/uL — AB (ref 4.0–10.5)

## 2017-08-06 LAB — COMPREHENSIVE METABOLIC PANEL
ALT: 24 U/L (ref 14–54)
ANION GAP: 6 (ref 5–15)
AST: 33 U/L (ref 15–41)
Albumin: 2.9 g/dL — ABNORMAL LOW (ref 3.5–5.0)
Alkaline Phosphatase: 101 U/L (ref 38–126)
BUN: 7 mg/dL (ref 6–20)
CHLORIDE: 101 mmol/L (ref 101–111)
CO2: 31 mmol/L (ref 22–32)
Calcium: 8.4 mg/dL — ABNORMAL LOW (ref 8.9–10.3)
Creatinine, Ser: 0.71 mg/dL (ref 0.44–1.00)
GFR calc non Af Amer: >60 mL/min (ref >60)
Glucose, Bld: 97 mg/dL (ref 65–99)
Potassium: 4.3 mmol/L (ref 3.5–5.1)
SODIUM: 138 mmol/L (ref 135–145)
Total Bilirubin: 0.5 mg/dL (ref 0.3–1.2)
Total Protein: 6 g/dL — ABNORMAL LOW (ref 6.5–8.1)

## 2017-08-06 LAB — MAGNESIUM: Magnesium: 1.4 mg/dL — ABNORMAL LOW (ref 1.7–2.4)

## 2017-08-06 LAB — HIV ANTIBODY (ROUTINE TESTING W REFLEX): HIV Screen 4th Generation wRfx: NONREACTIVE

## 2017-08-06 NOTE — Progress Notes (Signed)
Patient has right-sided pyelonephritis currently on IV Levaquin 750 IV fluids has considerable pain no evidence of nephrolithiasis [name] [id number] DOB: February 20, 1981 DOA: 08/05/2017 PCP: Oval Linsey, MD   Physical Exam: Blood pressure (!) 89/46, pulse 63, temperature 98.5 F (36.9 C), temperature source Oral, resp. rate 18, height  (1.549 Woods), weight 54.4 kg (120 lb), SpO2 (!) 89 %.  Lungs clear to A&P with a prolonged expiratory phase heart regular rhythm no S3-S4 no heaves thrills rubs some CVA tenderness to palpation some right lower quadrant tenderness to palpation no rebound tenderness elicited bowel sounds are normoactive   Investigations:  No results found for this or any previous visit (from the past 240 hour(s)).   Basic Metabolic Panel: Recent Labs    08/05/17 0709 08/06/17 0435  NA 139 138  K 3.7 4.3  CL 99* 101  CO2 31 31  GLUCOSE 99 97  BUN 7 7  CREATININE 0.56 0.71  CALCIUM 8.9 8.4*  MG  --  1.4*   Liver Function Tests: Recent Labs    08/05/17 0709 08/06/17 0435  AST 17 33  ALT 14 24  ALKPHOS 100 101  BILITOT 0.5 0.5  PROT 7.0 6.0*  ALBUMIN 3.6 2.9*     CBC: Recent Labs    08/05/17 0709 08/06/17 0435  WBC 17.3* 11.6*  NEUTROABS 11.4* 6.7  HGB 13.6 12.5  HCT 40.0 38.4  MCV 98.8 101.6*  PLT 246 225    US Renal  Result Date: 08/06/2017 CLINICAL DATA:  Flank region pain EXAM: RENAL / URINARY TRACT ULTRASOUND COMPLETE COMPARISON:  Aug 05, 2017 CT abdomen and pelvis FINDINGS: Right Kidney: Length: 10.1 cm. Echogenicity and renal cortical thickness are within normal limits. No mass, perinephric fluid, or hydronephrosis visualized. No sonographically demonstrable calculus or ureterectasis. Left Kidney: Length: 10.4 cm. Echogenicity and renal cortical thickness are within normal limits. No mass, perinephric fluid, or hydronephrosis visualized. No sonographically demonstrable calculus or ureterectasis. Bladder: Appears normal for degree of  bladder distention. IMPRESSION: Study within normal limits. Previously noted hydronephrosis on the right is no longer appreciable. Suspect recent calculus passage on the right given current appearance by ultrasound compared to CT appearance 1 day prior. Electronically Signed   By: Bretta Bang III Woods.D.   On: 08/06/2017 10:53   Ct Renal Stone Study  Result Date: 08/05/2017 CLINICAL DATA:  Right flank region pain EXAM: CT ABDOMEN AND PELVIS WITHOUT CONTRAST TECHNIQUE: Multidetector CT imaging of the abdomen and pelvis was performed following the standard protocol without oral or IV contrast. COMPARISON:  March 24, 2017 FINDINGS: Lower chest: Lung bases are clear. Hepatobiliary: There is apparent fatty infiltration near the fissure for the ligamentum teres. No liver lesions beyond apparent fatty infiltration noted on this noncontrast enhanced study. Gallbladder is absent. There is no biliary duct dilatation. Pancreas: There is no pancreatic mass or inflammatory focus. Spleen: No splenic lesions are evident. Adrenals/Urinary Tract: Adrenals bilaterally appear normal. The right kidney appears edematous. There is no renal mass on either side. There is moderate hydronephrosis on the right. There is no hydronephrosis on the left. There is no intrarenal calculus on either side. There is edema along the course of much of the right ureter. No ureteral calculi are evident on either side. Urinary bladder is midline with wall thickness within normal limits. Stomach/Bowel: There is no appreciable bowel wall or mesenteric thickening. No bowel obstruction. No free air or portal venous air. Vascular/Lymphatic: There is no abdominal aortic aneurysm. No vascular lesions  are evident on this study. There is no adenopathy in the abdomen or pelvis. Reproductive: Uterus is absent.  No evident pelvic mass. Other: There are calcifications in right lower quadrant, stable. Appendix appears unremarkable. There is no periappendiceal  region inflammation. There is no abscess or ascites in the abdomen or pelvis. There is a minimal ventral hernia containing only fat. Musculoskeletal: There are no blastic or lytic bone lesions. There is no intramuscular or abdominal wall lesion. IMPRESSION: 1. Right kidney is edematous. There is moderate hydronephrosis on the right. There is edema involving much of the right ureter. No ureteral calculus is appreciable on this study. Question recent calculus passage. Pyelonephritis is a differential consideration. No renal abscess or localized lobar nephronia evident by CT. 2. No bowel obstruction. No abscess. No appendiceal region inflammation. Stable small calcifications in the right lower quadrant, separate from the appendix. 3. Apparent fatty infiltration in the liver near the fissure for the ligamentum teres, also present on prior study. Gallbladder absent. 4.  Uterus absent. Electronically Signed   By: Bretta Bang III Woods.D.   On: 08/05/2017 09:01      Medications:  Impression:  Principal Problem:   Acute pyelonephritis Active Problems:   History of Narcotic abuse   Right flank pain   Leukocytosis   UTI (urinary tract infection)   Moderate Hydronephrosis of right kidney   Generalized anxiety disorder     Plan: Continue IV Levaquin 750 daily continue IV fluids continue monitoring pain response leukocytosis CBC in a.Woods.  Consultants:    Procedures   Antibiotics: Levaquin IV 750 every 24 hours  Time spent: 30 minutes   LOS: 1 day   [name]   08/06/2017, 1:47 PM

## 2017-08-07 LAB — COMPREHENSIVE METABOLIC PANEL
ALK PHOS: 138 U/L — AB (ref 38–126)
ALT: 52 U/L (ref 14–54)
AST: 75 U/L — ABNORMAL HIGH (ref 15–41)
Albumin: 2.8 g/dL — ABNORMAL LOW (ref 3.5–5.0)
Anion gap: 5 (ref 5–15)
BILIRUBIN TOTAL: 0.5 mg/dL (ref 0.3–1.2)
BUN: 5 mg/dL — AB (ref 6–20)
CHLORIDE: 103 mmol/L (ref 101–111)
CO2: 31 mmol/L (ref 22–32)
CREATININE: 0.69 mg/dL (ref 0.44–1.00)
Calcium: 8.7 mg/dL — ABNORMAL LOW (ref 8.9–10.3)
GFR calc Af Amer: >60 mL/min (ref >60)
GFR calc non Af Amer: >60 mL/min (ref >60)
GLUCOSE: 105 mg/dL — AB (ref 65–99)
POTASSIUM: 4.1 mmol/L (ref 3.5–5.1)
SODIUM: 139 mmol/L (ref 135–145)
Total Protein: 6 g/dL — ABNORMAL LOW (ref 6.5–8.1)

## 2017-08-07 LAB — CBC WITH DIFFERENTIAL/PLATELET
Basophils Absolute: 0 10*3/uL (ref 0.0–0.1)
Basophils Relative: 1 %
EOS PCT: 5 %
Eosinophils Absolute: 0.4 10*3/uL (ref 0.0–0.7)
HEMATOCRIT: 36.9 % (ref 36.0–46.0)
Hemoglobin: 11.8 g/dL — ABNORMAL LOW (ref 12.0–15.0)
LYMPHS ABS: 2.9 10*3/uL (ref 0.7–4.0)
LYMPHS PCT: 40 %
MCH: 32.3 pg (ref 26.0–34.0)
MCHC: 32 g/dL (ref 30.0–36.0)
MCV: 101.1 fL — AB (ref 78.0–100.0)
MONO ABS: 0.9 10*3/uL (ref 0.1–1.0)
MONOS PCT: 12 %
NEUTROS ABS: 3.1 10*3/uL (ref 1.7–7.7)
Neutrophils Relative %: 42 %
PLATELETS: 215 10*3/uL (ref 150–400)
RBC: 3.65 MIL/uL — ABNORMAL LOW (ref 3.87–5.11)
RDW: 12.1 % (ref 11.5–15.5)
WBC: 7.3 10*3/uL (ref 4.0–10.5)

## 2017-08-07 LAB — MAGNESIUM: Magnesium: 1.5 mg/dL — ABNORMAL LOW (ref 1.7–2.4)

## 2017-08-07 MED ORDER — HYOSCYAMINE SULFATE 0.125 MG/ML PO SOLN
0.2500 mg | Freq: Four times a day (QID) | ORAL | Status: DC | PRN
  Filled 2017-08-07: qty 2

## 2017-08-07 MED ORDER — HYOSCYAMINE SULFATE 0.125 MG PO TBDP
0.2500 mg | ORAL_TABLET | Freq: Four times a day (QID) | ORAL | Status: DC | PRN
  Administered 2017-08-07: 0.25 mg via SUBLINGUAL
  Filled 2017-08-07 (×2): qty 2

## 2017-08-07 MED ORDER — LEVOFLOXACIN 750 MG PO TABS
750.0000 mg | ORAL_TABLET | Freq: Every day | ORAL | Status: DC
  Administered 2017-08-08 – 2017-08-10 (×3): 750 mg via ORAL
  Filled 2017-08-07 (×3): qty 1

## 2017-08-07 NOTE — Progress Notes (Signed)
Pt stated to nurse that "You been riding me all day about narcotics and getting on the pain pills. You told the doctor to get rid of the IV dilaudid". Educated pt that until she asked RN to page MD, RN had not spoken with MD. Pt states "I don't believe you". Pt educated that RN was attempting to educate her throughout the day and did not speak with MD.

## 2017-08-07 NOTE — Plan of Care (Signed)
Pt educated on IV pain management vs PO pain management. Pt not receptive.

## 2017-08-07 NOTE — Progress Notes (Signed)
Patient with pyelonephritis no evidence of nephrolithiasis currently treated with IV Levaquin 750 daily renal function remains normal she has some diminishing pain in right lower quadrant right flank improved over yesterday white count dropped to 6 from 11 currently afebrile all markers of infection and inflammation are improving continue the same [name] [id number] DOB: [birth year] DOA: 08/05/2017 PCP: Lucia Gaskins, MD   Physical Exam: Blood pressure 94/60, pulse (!) 50, temperature 98.2 F (36.8 C), temperature source Oral, resp. rate 20, height '5\' 1"'  (1.549 m), weight 54.4 kg (120 lb), SpO2 100 %.  Lungs prolonged expiratory phase scattered rhonchi no rales no wheeze appreciable heart regular rhythm no S3-S4 no heaves thrills rubs of right CVA tenderness noted some may be right lower quadrant discomfort noted to deep palpation no rebound tenderness   Investigations:  Recent Results (from the past 240 hour(s))  Urine culture     Status: Abnormal (Preliminary result)   Collection Time: 08/05/17  7:30 AM  Result Value Ref Range Status   Specimen Description   Final    URINE, CLEAN CATCH Performed at [facility], [address], [place], [place] [address]    Special Requests   Final    NONE Performed at [facility], [address], [place], [place] [address]    Culture >=100,000 COLONIES/mL ESCHERICHIA COLI (A)  Final   Report Status PENDING  Incomplete     Basic Metabolic Panel: Recent Labs    08/06/17 0435 08/07/17 0611  NA 138 139  K 4.3 4.1  CL 101 103  CO2 31 31  GLUCOSE 97 105*  BUN 7 5*  CREATININE 0.71 0.69  CALCIUM 8.4* 8.7*  MG 1.4* 1.5*   Liver Function Tests: Recent Labs    08/06/17 0435 08/07/17 0611  AST 33 75*  ALT 24 52  ALKPHOS 101 138*  BILITOT 0.5 0.5  PROT 6.0* 6.0*  ALBUMIN 2.9* 2.8*     CBC: Recent Labs    08/06/17 0435 08/07/17 0611  WBC 11.6* 7.3  NEUTROABS 6.7 3.1  HGB 12.5 11.8*  HCT 38.4 36.9  MCV 101.6* 101.1*  PLT  225 215    US Renal  Result Date: 08/06/2017 CLINICAL DATA:  Flank region pain EXAM: RENAL / URINARY TRACT ULTRASOUND COMPLETE COMPARISON:  Aug 05, 2017 CT abdomen and pelvis FINDINGS: Right Kidney: Length: 10.1 cm. Echogenicity and renal cortical thickness are within normal limits. No mass, perinephric fluid, or hydronephrosis visualized. No sonographically demonstrable calculus or ureterectasis. Left Kidney: Length: 10.4 cm. Echogenicity and renal cortical thickness are within normal limits. No mass, perinephric fluid, or hydronephrosis visualized. No sonographically demonstrable calculus or ureterectasis. Bladder: Appears normal for degree of bladder distention. IMPRESSION: Study within normal limits. Previously noted hydronephrosis on the right is no longer appreciable. Suspect recent calculus passage on the right given current appearance by ultrasound compared to CT appearance 1 day prior. Electronically Signed   By: Lowella Grip III M.D.   On: 08/06/2017 10:53      Medications:   Impression:  Principal Problem:   Acute pyelonephritis Active Problems:   History of Narcotic abuse   Right flank pain   Leukocytosis   UTI (urinary tract infection)   Moderate Hydronephrosis of right kidney   Generalized anxiety disorder     Plan: Continue IV Levaquin be met in a.m.  Consultants:    Procedures   Antibiotics: IV Levaquin       Time spent: 30 minutes   LOS: 2 days  Jaylon Boylen M   08/07/2017, 2:58 PM

## 2017-08-07 NOTE — Progress Notes (Signed)
Pt asking about IV dilaudid. Pt educated on what a PRN medication is and when it is available to her. Pt educated on PO options as well. Pt states she wants her PO and IV medication together. Pt educated on issues with multiple narcotics at the same time. Pt states "They been giving me all the meds at once". Pt educated this is not good practice and when her medication is available. Pt states "I want my damn dilaudid". Pt educated it is not available until 1437 and she just took PO pain medication. Pt educated on narcotic usage and need to decrease IV narcotics in preparation for going home. Pt verbalizes understanding and states "I don't care". Pt again provided timeline and medication available PRN. Pt verbalizes understanding.

## 2017-08-07 NOTE — Progress Notes (Signed)
RN to room to remove IV and provide pt with AMA paper. Pt asking for discharge abx. Pt informed that AMA means she is not getting discharged so no abx or pain meds will be given. Pt on phone and states will notify nurse when she decides what to do. CN Jessica notified of same.

## 2017-08-07 NOTE — Progress Notes (Signed)
PHARMACIST - PHYSICIAN COMMUNICATION DR:   Janna Archondiego CONCERNING: Antibiotic: IV- to- Oral Route Change Policy  RECOMMENDATION: This patient is receiving Levaquin 750mg  by the intravenous route.  Based on criteria approved by the Pharmacy and Therapeutics Committee, the antibiotic(s) is/are being converted to the equivalent oral dose form(s).   DESCRIPTION: These criteria include:  Patient being treated for a respiratory tract infection, urinary tract infection, cellulitis or clostridium difficile associated diarrhea if on metronidazole  The patient is not neutropenic and does not exhibit a GI malabsorption state  The patient is eating (either orally or via tube) and/or has been taking other orally administered medications for a least 24 hours  The patient is improving clinically and has a Tmax < 100.5  If you have questions about this conversion, please contact the Pharmacy Department  [x]   [phone]( [phone] )  [facility] []   [phone]( [phone] )  [facility]  []   [phone]( [phone] )  [facility] []   [phone]( [phone] )  [facility]

## 2017-08-07 NOTE — Progress Notes (Signed)
Pt asking if she can have nausea medication. Pt provided with nausea meds. Pt states "Leave me alone tonight. If I can't get pain meds whats the point of this IV. I just want to be left alone". Pt educated on purpose of IV abx and fluids. Pt rolled her eyes at the RN. Pt stated "Well what happens if I get to hurting the way I was?" Pt informed that the nightshift RN would call the on call MD and inform them of the situation. Pt stated "well ya'll don't care if someone suffers". Pt educated that we are able to limit suffering as much as possible, however, we can only give what the MD orders and the patients MD has stated he will not give additional pain medication. Pt rolled over in bed and stated that "Y'all just don't care"

## 2017-08-07 NOTE — Progress Notes (Signed)
Paged Dr Janna Archondiego per pt request for additional pain medication. Relayed pt's request for additional pain medication or discharge to home. MD stated he would discuss it with patient when he comes in on 08/08/17 but would not be ordering additional pain medication. MD stated that pt was able to leave AMA if she would like but he strongly recommended against it due to the risk of losing a kidney. Pt informed of same. Pt stated she was leaving AMA. Dr informed of same.

## 2017-08-08 ENCOUNTER — Inpatient Hospital Stay (HOSPITAL_COMMUNITY): Payer: BLUE CROSS/BLUE SHIELD

## 2017-08-08 LAB — CBC WITH DIFFERENTIAL/PLATELET
Basophils Absolute: 0.1 10*3/uL (ref 0.0–0.1)
Basophils Relative: 1 %
Eosinophils Absolute: 0.4 10*3/uL (ref 0.0–0.7)
Eosinophils Relative: 6 %
HEMATOCRIT: 38.2 % (ref 36.0–46.0)
Hemoglobin: 12.3 g/dL (ref 12.0–15.0)
LYMPHS PCT: 42 %
Lymphs Abs: 2.6 10*3/uL (ref 0.7–4.0)
MCH: 32.5 pg (ref 26.0–34.0)
MCHC: 32.2 g/dL (ref 30.0–36.0)
MCV: 101.1 fL — AB (ref 78.0–100.0)
MONO ABS: 0.7 10*3/uL (ref 0.1–1.0)
MONOS PCT: 11 %
NEUTROS ABS: 2.5 10*3/uL (ref 1.7–7.7)
Neutrophils Relative %: 40 %
Platelets: 232 10*3/uL (ref 150–400)
RBC: 3.78 MIL/uL — ABNORMAL LOW (ref 3.87–5.11)
RDW: 12.1 % (ref 11.5–15.5)
WBC: 6.2 10*3/uL (ref 4.0–10.5)

## 2017-08-08 LAB — COMPREHENSIVE METABOLIC PANEL
ALBUMIN: 2.9 g/dL — AB (ref 3.5–5.0)
ALT: 154 U/L — ABNORMAL HIGH (ref 14–54)
AST: 200 U/L — AB (ref 15–41)
Alkaline Phosphatase: 179 U/L — ABNORMAL HIGH (ref 38–126)
Anion gap: 6 (ref 5–15)
BILIRUBIN TOTAL: 0.4 mg/dL (ref 0.3–1.2)
BUN: 7 mg/dL (ref 6–20)
CHLORIDE: 107 mmol/L (ref 101–111)
CO2: 30 mmol/L (ref 22–32)
Calcium: 9 mg/dL (ref 8.9–10.3)
Creatinine, Ser: 0.68 mg/dL (ref 0.44–1.00)
GFR calc Af Amer: >60 mL/min (ref >60)
GFR calc non Af Amer: >60 mL/min (ref >60)
GLUCOSE: 97 mg/dL (ref 65–99)
POTASSIUM: 4.8 mmol/L (ref 3.5–5.1)
Sodium: 143 mmol/L (ref 135–145)
TOTAL PROTEIN: 6.2 g/dL — AB (ref 6.5–8.1)

## 2017-08-08 LAB — URINE CULTURE

## 2017-08-08 LAB — MAGNESIUM: Magnesium: 1.5 mg/dL — ABNORMAL LOW (ref 1.7–2.4)

## 2017-08-08 MED ORDER — MAGNESIUM OXIDE 400 (241.3 MG) MG PO TABS
400.0000 mg | ORAL_TABLET | Freq: Three times a day (TID) | ORAL | Status: DC
  Administered 2017-08-08 – 2017-08-10 (×9): 400 mg via ORAL
  Filled 2017-08-08 (×9): qty 1

## 2017-08-08 NOTE — Progress Notes (Signed)
Patient admitted with right pyelonephritis currently on Levaquin 750 IV every 24 hours question of recently passed ureteral calculus due to edematous kidney no stone visualized on admission study she now presents while on Levaquin with increased LFTs as well as alkaline phosphatase she is status post cholecystectomy will obtain stat abdominal ultrasound to look for any possible choledocholithiasis as an explanation for elevated LFTs CVA tenderness and right lower quadrant tenderness is much improved over yesterday January Bergthold [id number] DOB: 10-Jun-1980 DOA: 08/05/2017 PCP: Oval Linsey, MD   Physical Exam: Blood pressure 94/77, pulse (!) 49, temperature 98.4 F (36.9 C), temperature source Oral, resp. rate 15, height 5\' 1"  (1.549 m), weight 54.4 kg (120 lb), SpO2 100 %.  Lungs show prolonged expiratory phase scattered rhonchi no rales no wheeze heart regular rhythm no S3-S4 no heaves thrills rubs abdomen new right upper quadrant tenderness to deep palpation positive Murphy sign clinically no guarding no rebound bowel sounds normoactive no peristaltic rushes noted   Investigations:  Recent Results (from the past 240 hour(s))  Urine culture     Status: Abnormal   Collection Time: 08/05/17  7:30 AM  Result Value Ref Range Status   Specimen Description   Final    URINE, CLEAN CATCH Performed at [facility], [address] [place], [place] [address]    Special Requests   Final    NONE Performed at [facility], [address], [place], [place] [address]    Culture >=100,000 COLONIES/mL ESCHERICHIA COLI (A)  Final   Report Status 08/08/2017 FINAL  Final   Organism ID, Bacteria ESCHERICHIA COLI (A)  Final      Susceptibility   Escherichia coli - MIC*    AMPICILLIN >=32 RESISTANT Resistant     CEFAZOLIN <=4 SENSITIVE Sensitive     CEFTRIAXONE <=1 SENSITIVE Sensitive     CIPROFLOXACIN <=0.25 SENSITIVE Sensitive     GENTAMICIN <=1 SENSITIVE Sensitive     IMIPENEM <=0.25 SENSITIVE  Sensitive     NITROFURANTOIN 64 INTERMEDIATE Intermediate     TRIMETH/SULFA <=20 SENSITIVE Sensitive     AMPICILLIN/SULBACTAM >=32 RESISTANT Resistant     PIP/TAZO <=4 SENSITIVE Sensitive     Extended ESBL NEGATIVE Sensitive     * >=100,000 COLONIES/mL ESCHERICHIA COLI     Basic Metabolic Panel: Recent Labs    08/07/17 0611 08/08/17 0721  NA 139 143  K 4.1 4.8  CL 103 107  CO2 31 30  GLUCOSE 105* 97  BUN 5* 7  CREATININE 0.69 0.68  CALCIUM 8.7* 9.0  MG 1.5* 1.5*   Liver Function Tests: Recent Labs    08/07/17 0611 08/08/17 0721  AST 75* 200*  ALT 52 154*  ALKPHOS 138* 179*  BILITOT 0.5 0.4  PROT 6.0* 6.2*  ALBUMIN 2.8* 2.9*     CBC: Recent Labs    08/07/17 0611 08/08/17 0721  WBC 7.3 6.2  NEUTROABS 3.1 2.5  HGB 11.8* 12.3  HCT 36.9 38.2  MCV 101.1* 101.1*  PLT 215 232    US Renal  Result Date: 08/06/2017 CLINICAL DATA:  Flank region pain EXAM: RENAL / URINARY TRACT ULTRASOUND COMPLETE COMPARISON:  Aug 05, 2017 CT abdomen and pelvis FINDINGS: Right Kidney: Length: 10.1 cm. Echogenicity and renal cortical thickness are within normal limits. No mass, perinephric fluid, or hydronephrosis visualized. No sonographically demonstrable calculus or ureterectasis. Left Kidney: Length: 10.4 cm. Echogenicity and renal cortical thickness are within normal limits. No mass, perinephric fluid, or hydronephrosis visualized. No sonographically demonstrable calculus or ureterectasis.  Bladder: Appears normal for degree of bladder distention. IMPRESSION: Study within normal limits. Previously noted hydronephrosis on the right is no longer appreciable. Suspect recent calculus passage on the right given current appearance by ultrasound compared to CT appearance 1 day prior. Electronically Signed   By: Bretta BangWilliam  Woodruff III M.D.   On: 08/06/2017 10:53      Medications: Impression: *Principal Problem:   Acute pyelonephritis Active Problems:   History of Narcotic abuse   Right  flank pain   Leukocytosis   UTI (urinary tract infection)   Moderate Hydronephrosis of right kidney   Generalized anxiety disorder     Plan: Stat abdominal ultrasound to rule out choledocholithiasis serial hepatic function panels for a.m. continue Levaquin 750 IV every 24 hours  Consultants:     Procedures abdominal sonography today   Antibiotics: Levaquin 750 IV every 24 hours           Time spent: 45 minutes  LOS: 3 days   Siniya Lichty M   08/08/2017, 10:14 AM

## 2017-08-09 LAB — CBC WITH DIFFERENTIAL/PLATELET
BASOS ABS: 0 10*3/uL (ref 0.0–0.1)
Basophils Relative: 1 %
EOS PCT: 5 %
Eosinophils Absolute: 0.4 10*3/uL (ref 0.0–0.7)
HCT: 37.3 % (ref 36.0–46.0)
Hemoglobin: 12.1 g/dL (ref 12.0–15.0)
Lymphocytes Relative: 40 %
Lymphs Abs: 2.8 10*3/uL (ref 0.7–4.0)
MCH: 32.6 pg (ref 26.0–34.0)
MCHC: 32.4 g/dL (ref 30.0–36.0)
MCV: 100.5 fL — ABNORMAL HIGH (ref 78.0–100.0)
MONO ABS: 0.7 10*3/uL (ref 0.1–1.0)
Monocytes Relative: 10 %
NEUTROS ABS: 3.1 10*3/uL (ref 1.7–7.7)
Neutrophils Relative %: 44 %
PLATELETS: 265 10*3/uL (ref 150–400)
RBC: 3.71 MIL/uL — ABNORMAL LOW (ref 3.87–5.11)
RDW: 12.1 % (ref 11.5–15.5)
WBC: 7 10*3/uL (ref 4.0–10.5)

## 2017-08-09 LAB — COMPREHENSIVE METABOLIC PANEL
ALBUMIN: 2.9 g/dL — AB (ref 3.5–5.0)
ALT: 216 U/L — ABNORMAL HIGH (ref 14–54)
ANION GAP: 7 (ref 5–15)
AST: 224 U/L — ABNORMAL HIGH (ref 15–41)
Alkaline Phosphatase: 190 U/L — ABNORMAL HIGH (ref 38–126)
BUN: 8 mg/dL (ref 6–20)
CO2: 30 mmol/L (ref 22–32)
Calcium: 8.8 mg/dL — ABNORMAL LOW (ref 8.9–10.3)
Chloride: 107 mmol/L (ref 101–111)
Creatinine, Ser: 0.72 mg/dL (ref 0.44–1.00)
GFR calc Af Amer: >60 mL/min (ref >60)
GFR calc non Af Amer: >60 mL/min (ref >60)
GLUCOSE: 98 mg/dL (ref 65–99)
Potassium: 3.7 mmol/L (ref 3.5–5.1)
Sodium: 144 mmol/L (ref 135–145)
TOTAL PROTEIN: 6.1 g/dL — AB (ref 6.5–8.1)
Total Bilirubin: 0.4 mg/dL (ref 0.3–1.2)

## 2017-08-09 NOTE — Progress Notes (Signed)
Patient is leaving unit to walk downstairs. Patient has been advised that it is against the rules and that we can not be liable if something were to happen. Patient verbalizes understanding of risks but states that she still wishes to leave the unit temporarily.

## 2017-08-09 NOTE — Progress Notes (Signed)
Patient admitted with right-sided pyelonephritis with a question of a recently passed possible stone with inflammation of right side placed on Levaquin.  Right CVA tenderness and right lower quadrant tenderness has improved since admission however yesterday her LFTs began elevating along with alkaline phosphatase.  She is status post cholecystectomy and an abdominal sonogram was done looking for choledocholithiasis which revealed no evidence of stricture CBD dilatation or stone.  Acute hepatitis serologies serologies are ordered and a GI consult will be obtained at earliest convenience Angel RheaCheri Woods [id number] DOB: 1980/04/07 DOA: 08/05/2017 PCP: Oval Linseyondiego, Angel Claw, MD   Physical Exam: Blood pressure 134/78, pulse (!) 42, temperature (!) 97.5 F (36.4 C), temperature source Oral, resp. rate 16, height 5\' 1"  (1.549 m), weight 54.4 kg (120 lb), SpO2 100 %.  Lungs show prolonged expiratory phase no rales wheeze rhonchi appreciable heart regular rhythm no S3-S4 no heaves thrills rubs improve right upper quadrant discomfort to deep palpation since yesterday bowel sounds normoactive no guarding no rebound no masses no megaly no CVA tenderness on right side today no right lower quadrant tenderness or masses elicited   Investigations:  Recent Results (from the past 240 hour(s))  Urine culture     Status: Abnormal   Collection Time: 08/05/17  7:30 AM  Result Value Ref Range Status   Specimen Description   Final    URINE, CLEAN CATCH Performed at [facility], [address] [place], [place] [address]    Special Requests   Final    NONE Performed at [facility], [address] [place], [place] [address]    Culture >=100,000 COLONIES/mL ESCHERICHIA COLI (A)  Final   Report Status 08/08/2017 FINAL  Final   Organism ID, Bacteria ESCHERICHIA COLI (A)  Final      Susceptibility   Escherichia coli - MIC*    AMPICILLIN >=32 RESISTANT Resistant     CEFAZOLIN <=4 SENSITIVE Sensitive     CEFTRIAXONE <=1  SENSITIVE Sensitive     CIPROFLOXACIN <=0.25 SENSITIVE Sensitive     GENTAMICIN <=1 SENSITIVE Sensitive     IMIPENEM <=0.25 SENSITIVE Sensitive     NITROFURANTOIN 64 INTERMEDIATE Intermediate     TRIMETH/SULFA <=20 SENSITIVE Sensitive     AMPICILLIN/SULBACTAM >=32 RESISTANT Resistant     PIP/TAZO <=4 SENSITIVE Sensitive     Extended ESBL NEGATIVE Sensitive     * >=100,000 COLONIES/mL ESCHERICHIA COLI     Basic Metabolic Panel: Recent Labs    08/07/17 0611 08/08/17 0721 08/09/17 0623  NA 139 143 144  K 4.1 4.8 3.7  CL 103 107 107  CO2 31 30 30   GLUCOSE 105* 97 98  BUN 5* 7 8  CREATININE 0.69 0.68 0.72  CALCIUM 8.7* 9.0 8.8*  MG 1.5* 1.5*  --    Liver Function Tests: Recent Labs    08/08/17 0721 08/09/17 0623  AST 200* 224*  ALT 154* 216*  ALKPHOS 179* 190*  BILITOT 0.4 0.4  PROT 6.2* 6.1*  ALBUMIN 2.9* 2.9*     CBC: Recent Labs    08/08/17 0721 08/09/17 0623  WBC 6.2 7.0  NEUTROABS 2.5 3.1  HGB 12.3 12.1  HCT 38.2 37.3  MCV 101.1* 100.5*  PLT 232 265    [place] Abdomen Complete  Result Date: 08/08/2017 CLINICAL DATA:  37 year old female with a history of prior cholecystectomy. Evaluate for choledocholithiasis. EXAM: ABDOMEN ULTRASOUND COMPLETE COMPARISON:  CT scan of the abdomen and pelvis 08/05/2017 FINDINGS: Gallbladder: The gallbladder is surgically absent. Common bile duct: Diameter: Within normal  limits measuring between 4 and 6 mm. No choledocholithiasis visualized. Liver: No focal lesion identified. Within normal limits in parenchymal echogenicity. Portal vein is patent on color Doppler imaging with normal direction of blood flow towards the liver. IVC: No abnormality visualized. Pancreas: Visualized portion unremarkable. Spleen: Size and appearance within normal limits. Right Kidney: Length: 10.1 cm. Echogenicity within normal limits. No mass or hydronephrosis visualized. Left Kidney: Length: 10.4 cm. Echogenicity within normal limits. No mass or  hydronephrosis visualized. Abdominal aorta: No aneurysm visualized. Other findings: None. IMPRESSION: 1. Surgical changes of prior cholecystectomy without evidence of biliary ductal dilatation or visualized choledocholithiasis. 2. Otherwise, unremarkable abdominal ultrasound. Electronically Signed   By: Malachy Moan M.D.   On: 08/08/2017 11:35      Medications:   Impression: *Principal Problem:   Acute pyelonephritis Active Problems:   History of Narcotic abuse   Right flank pain   Leukocytosis   UTI (urinary tract infection)   Moderate Hydronephrosis of right kidney   Generalized anxiety disorder     Plan: Continue Levaquin and IV fluids for UTI as well as resolving pyelonephritis.  Order acute hepatitis serologies at present obtain GI consultation regarding increasing LFTs over the previous 48 hours patient with prior cholecystectomy  Consultants: Gastroenterology requested   Procedures abdominal sonogram performed yesterday   Antibiotics: Levaquin 750 IV daily           Time spent: 30 minutes   LOS: 4 days   [name] M   08/09/2017, 11:55 AM

## 2017-08-10 ENCOUNTER — Telehealth: Payer: Self-pay | Admitting: Gastroenterology

## 2017-08-10 ENCOUNTER — Encounter (HOSPITAL_COMMUNITY): Payer: Self-pay | Admitting: Gastroenterology

## 2017-08-10 ENCOUNTER — Other Ambulatory Visit: Payer: Self-pay

## 2017-08-10 DIAGNOSIS — R748 Abnormal levels of other serum enzymes: Secondary | ICD-10-CM

## 2017-08-10 DIAGNOSIS — N10 Acute tubulo-interstitial nephritis: Secondary | ICD-10-CM

## 2017-08-10 LAB — COMPREHENSIVE METABOLIC PANEL
ALT: 157 U/L — ABNORMAL HIGH (ref 14–54)
ANION GAP: 7 (ref 5–15)
AST: 82 U/L — AB (ref 15–41)
Albumin: 2.9 g/dL — ABNORMAL LOW (ref 3.5–5.0)
Alkaline Phosphatase: 190 U/L — ABNORMAL HIGH (ref 38–126)
BILIRUBIN TOTAL: 0.6 mg/dL (ref 0.3–1.2)
BUN: 5 mg/dL — ABNORMAL LOW (ref 6–20)
CHLORIDE: 106 mmol/L (ref 101–111)
CO2: 30 mmol/L (ref 22–32)
Calcium: 8.7 mg/dL — ABNORMAL LOW (ref 8.9–10.3)
Creatinine, Ser: 0.74 mg/dL (ref 0.44–1.00)
Glucose, Bld: 98 mg/dL (ref 65–99)
POTASSIUM: 3.4 mmol/L — AB (ref 3.5–5.1)
Sodium: 143 mmol/L (ref 135–145)
Total Protein: 6 g/dL — ABNORMAL LOW (ref 6.5–8.1)

## 2017-08-10 LAB — CBC WITH DIFFERENTIAL/PLATELET
BASOS ABS: 0 10*3/uL (ref 0.0–0.1)
Basophils Relative: 1 %
EOS PCT: 5 %
Eosinophils Absolute: 0.4 10*3/uL (ref 0.0–0.7)
HCT: 35.6 % — ABNORMAL LOW (ref 36.0–46.0)
HEMOGLOBIN: 11.6 g/dL — AB (ref 12.0–15.0)
LYMPHS PCT: 43 %
Lymphs Abs: 3.1 10*3/uL (ref 0.7–4.0)
MCH: 33 pg (ref 26.0–34.0)
MCHC: 32.6 g/dL (ref 30.0–36.0)
MCV: 101.1 fL — AB (ref 78.0–100.0)
Monocytes Absolute: 0.6 10*3/uL (ref 0.1–1.0)
Monocytes Relative: 8 %
NEUTROS ABS: 3 10*3/uL (ref 1.7–7.7)
NEUTROS PCT: 43 %
PLATELETS: 297 10*3/uL (ref 150–400)
RBC: 3.52 MIL/uL — AB (ref 3.87–5.11)
RDW: 12.3 % (ref 11.5–15.5)
WBC: 7.1 10*3/uL (ref 4.0–10.5)

## 2017-08-10 LAB — HEPATITIS PANEL, ACUTE
HCV Ab: <0.1 s/co ratio (ref 0.0–0.9)
HEP B S AG: NEGATIVE
Hep A IgM: NEGATIVE
Hep B C IgM: NEGATIVE

## 2017-08-10 MED ORDER — OXYCODONE HCL 5 MG PO TABS
10.0000 mg | ORAL_TABLET | Freq: Once | ORAL | Status: AC
  Administered 2017-08-10: 10 mg via ORAL
  Filled 2017-08-10: qty 2

## 2017-08-10 MED ORDER — HYDROMORPHONE HCL 1 MG/ML IJ SOLN
0.5000 mg | Freq: Once | INTRAMUSCULAR | Status: AC
  Administered 2017-08-10: 0.5 mg via INTRAVENOUS
  Filled 2017-08-10: qty 0.5

## 2017-08-10 MED ORDER — HYDROMORPHONE HCL 1 MG/ML IJ SOLN
0.5000 mg | INTRAMUSCULAR | Status: AC
  Administered 2017-08-10: 0.5 mg via INTRAVENOUS
  Filled 2017-08-10: qty 0.5

## 2017-08-10 MED ORDER — ONDANSETRON HCL 4 MG/2ML IJ SOLN
4.0000 mg | Freq: Three times a day (TID) | INTRAMUSCULAR | Status: DC
  Administered 2017-08-10 (×2): 4 mg via INTRAVENOUS
  Filled 2017-08-10 (×2): qty 2

## 2017-08-10 NOTE — Telephone Encounter (Signed)
ADMITTED WITH TRANSIENT ELEVATION IN LIVER ENZYMES. NEEDS HFP/MRCP WITHIN 7 DAYS/AFTER JUN 7.

## 2017-08-10 NOTE — Progress Notes (Signed)
Patient complaining of pain when she breathes.   Patient states 'It hurts so bad when I try to take a deep breath."  Patients lung sounds are clear.  Patient was given a second one time dose of 0.5 dilaudid at 0402. Stated that helped, now she is asking for another dose at [facility]0510

## 2017-08-10 NOTE — Consult Note (Signed)
Referring Provider: Dr. Cindie Laroche Primary Care Physician:  Lucia Gaskins, MD Primary Gastroenterologist:  Dr. Gala Romney   Date of Admission: 08/05/17 Date of Consultation: 08/10/17  Reason for Consultation:  Elevated LFTs   HPI:  [name] is a 37 y.o. year old female last seen by [facility] in 2013 for abdominal pain. Colonoscopy was recommended at that time due to CT findings of colitis, but she did not show for this appointment. Presented this admission with abdominal pain, right flank pain,nausea, and admitted with acute pyelonephritis, moderate right hydronephrosis. Increased LFTs during admission. Prior mildly elevated transaminases in 2014 and 2016. LFTs normal on admission with increasing transaminases and alk phos noted 6/1. Transaminases improving today. CT with contrast this admission with normal liver. US abdomen without CBD dilation, spleen normal. Acute hepatitis panel in process.   Pain located in RUQ. Noted as constant. Worsening since admission and thought it was related to renal issues. Yesterday was worsening. States she can't take a deep breath as it hurts too bad. Worsened with eating/drinking. Associated nausea, no vomiting. Having hot flashes. No ETOH use. Denies drug use. Cocaine last in 2015/2016. History of tattoos. Use excedrin migraines for headaches daily. Occasional GERD. Wants Dilaudid instead of oxycodone.   Wants to go home. Can't stand the smell of food. Ate jello yesterday. Didn't eat this morning. Smell of food makes her nauseated.    History of drug abuse. Last positive drug screen for cocaine was in 2016. This admission positive for BZDs.   Past Medical History:  Diagnosis Date  . Anxiety   . Chronic back pain   . Chronic headache   . Chronic pain   . Chronic pelvic pain in female   . Cocaine use   . Endometriosis   . Heart murmur    as a child  . Herpes genitalis in women   . Hx of opioid abuse   . Hypokalemia   . Narcotic abuse ([place])    5 yrs ago  lortab, off few yrs  . Pneumonia    37 years  old  . Renal disorder Cyst on Kidney    Past Surgical History:  Procedure Laterality Date  . ABDOMINAL HYSTERECTOMY  2007   left ovary remains  . CARPAL TUNNEL RELEASE     both  . CHOLECYSTECTOMY  2010  . INCISION AND DRAINAGE ABSCESS N/A 07/14/2014   Procedure: INCISION AND DRAINAGE VULVAR ABSCESS;  Surgeon: Jonnie Kind, MD;  Location: [facility];  Service: Gynecology;  Laterality: N/A;  . LAPAROSCOPIC SALPINGO OOPHERECTOMY Left 08/15/2014   Procedure: LAPAROSCOPIC LEFT SALPINGO OOPHORECTOMY;  Surgeon: Jonnie Kind, MD;  Location: [facility];  Service: Gynecology;  Laterality: Left;  . TONSILLECTOMY    . TUBAL LIGATION      Prior to Admission medications   Medication Sig Start Date End Date Taking? Authorizing Provider  amphetamine-dextroamphetamine (ADDERALL) 20 MG tablet Take 20 mg by mouth daily.    Yes [provider]  aspirin-acetaminophen-caffeine (EXCEDRIN MIGRAINE) [phone] MG tablet Take 2 tablets by mouth every 6 (six) hours as needed for headache or migraine.   Yes [provider]  diphenhydrAMINE (BENADRYL) 25 mg capsule Take 25 mg by mouth every 6 (six) hours as needed for allergies.   Yes [provider]  estradiol (ESTRACE VAGINAL) 0.1 MG/GM vaginal cream Place 1 Applicatorful vaginally at bedtime. 03/31/17  Yes Florian Buff, MD  LORazepam (ATIVAN) 2 MG tablet Take 1 mg by mouth at bedtime as needed for anxiety.  Yes [provider]  oxyCODONE-acetaminophen (PERCOCET/ROXICET) 5-325 MG tablet Take 1-2 tablets by mouth every 4 (four) hours as needed for moderate pain or severe pain (Postoperative pain). As needed for postoperative pain. May alternate with tramadol 04/08/17  Yes Jonnie Kind, MD  vortioxetine HBr (TRINTELLIX) 5 MG TABS tablet Take 5 mg by mouth daily.   Yes [provider]  POTASSIUM PO Take 1 tablet by mouth daily.    05/16/11  [provider]    Current  Facility-Administered Medications  Medication Dose Route Frequency Provider Last Rate Last Dose  . 0.9 %  sodium chloride infusion   Intravenous Continuous Johnson, Clanford L, MD 75 mL/hr at 08/10/17 0150    . acetaminophen (TYLENOL) tablet 650 mg  650 mg Oral Q6H PRN Wynetta Emery, Clanford L, MD   650 mg at 08/07/17 2203   Or  . acetaminophen (TYLENOL) suppository 650 mg  650 mg Rectal Q6H PRN Johnson, Clanford L, MD      . docusate sodium (COLACE) capsule 100 mg  100 mg Oral BID Johnson, Clanford L, MD   100 mg at 08/10/17 0801  . enoxaparin (LOVENOX) injection 40 mg  40 mg Subcutaneous Q24H Johnson, Clanford L, MD   40 mg at 08/08/17 0906  . hyoscyamine (ANASPAZ) disintergrating tablet 0.25 mg  0.25 mg Sublingual Q6H PRN Lucia Gaskins, MD   0.25 mg at 08/07/17 2142  . levofloxacin (LEVAQUIN) tablet 750 mg  750 mg Oral Daily Lucia Gaskins, MD   750 mg at 08/10/17 0800  . LORazepam (ATIVAN) tablet 0.5 mg  0.5 mg Oral Q8H PRN Johnson, Clanford L, MD   0.5 mg at 08/09/17 1728  . magnesium oxide (MAG-OX) tablet 400 mg  400 mg Oral TID Lucia Gaskins, MD   400 mg at 08/10/17 0801  . nicotine (NICODERM CQ - dosed in mg/24 hours) patch 14 mg  14 mg Transdermal Daily Johnson, Clanford L, MD   14 mg at 08/10/17 0803  . ondansetron (ZOFRAN) tablet 4 mg  4 mg Oral Q6H PRN Johnson, Clanford L, MD   4 mg at 08/09/17 1728   Or  . ondansetron (ZOFRAN) injection 4 mg  4 mg Intravenous Q6H PRN Wynetta Emery, Clanford L, MD   4 mg at 08/06/17 2116  . oxyCODONE (Oxy IR/ROXICODONE) immediate release tablet 5 mg  5 mg Oral Q4H PRN Johnson, Clanford L, MD   5 mg at 08/10/17 1152  . vortioxetine HBr (TRINTELLIX) tablet 5 mg  5 mg Oral Daily Johnson, Clanford L, MD   5 mg at 08/10/17 0800    Allergies as of 08/05/2017 - Review Complete 08/05/2017  Allergen Reaction Noted  . Fentanyl Itching 06/07/2014  . Lyrica [pregabalin] Other (See Comments) 02/10/2017  . Morphine and related Itching 08/05/2017  . Nsaids  Other (See Comments) 09/21/2012  . Tramadol Other (See Comments) 09/19/2010  . Divalproex sodium Anxiety and Other (See Comments) 09/19/2010  . Fioricet-codeine [butalbital-apap-caff-cod] Itching 08/24/2011  . Ibuprofen Other (See Comments) 02/24/2011  . Ketorolac tromethamine Hives and Other (See Comments) 01/27/2011    Family History  Problem Relation Age of Onset  . Stroke Father   . Diverticulitis Father   . Alzheimer's disease Father   . Colon polyps Father        diagnosed in 12s-70s   . Hypertension Mother   . Diabetes Mother   . Heart disease Unknown   . Arthritis Unknown   . Diabetes Unknown   . Kidney disease Unknown   .  Heart attack Paternal Grandfather   . Alzheimer's disease Paternal Grandmother   . Heart attack Maternal Grandfather   . Liver disease Neg Hx   . Colon cancer Neg Hx     Social History   Socioeconomic History  . Marital status: Married    Spouse name: Not on file  . Number of children: 4  . Years of education: Not on file  . Highest education level: Not on file  Occupational History  . Occupation: homemaker  Social Needs  . Financial resource strain: Not on file  . Food insecurity:    Worry: Not on file    Inability: Not on file  . Transportation needs:    Medical: Not on file    Non-medical: Not on file  Tobacco Use  . Smoking status: Current Every Day Smoker    Packs/day: 1.00    Years: 18.00    Pack years: 18.00    Types: Cigarettes  . Smokeless tobacco: Never Used  Substance and Sexual Activity  . Alcohol use: No    Alcohol/week: 0.0 oz  . Drug use: No    Types: Cocaine    Comment: no drugs for 3 years 05/2014  . Sexual activity: Not Currently    Partners: Male    Birth control/protection: Surgical    Comment: hyst  Lifestyle  . Physical activity:    Days per week: Not on file    Minutes per session: Not on file  . Stress: Not on file  Relationships  . Social connections:    Talks on phone: Not on file    Gets  together: Not on file    Attends religious service: Not on file    Active member of club or organization: Not on file    Attends meetings of clubs or organizations: Not on file    Relationship status: Not on file  . Intimate partner violence:    Fear of current or ex partner: Not on file    Emotionally abused: Not on file    Physically abused: Not on file    Forced sexual activity: Not on file  Other Topics Concern  . Not on file  Social History Narrative  . Not on file    Review of Systems: Gen: Denies fever, chills, loss of appetite, change in weight or weight loss CV: Denies chest pain, heart palpitations, syncope, edema  Resp: Denies shortness of breath with rest, cough, wheezing GI: see HPI  GU : Denies urinary burning, urinary frequency, urinary incontinence.  MS: Denies joint pain,swelling, cramping Derm: Denies rash, itching, dry skin Psych: Denies depression, anxiety,confusion, or memory loss Heme: Denies bruising, bleeding, and enlarged lymph nodes.  Physical Exam: Vital signs in last 24 hours: Temp:  [97.7 F (36.5 C)-98.2 F (36.8 C)] 97.7 F (36.5 C) (06/03 0627) Pulse Rate:  [39-49] 40 (06/03 0627) Resp:  [16] 16 (06/03 0627) BP: (88-120)/(54-64) 120/61 (06/03 0627) SpO2:  [100 %] 100 % (06/03 0627) Last BM Date: 08/09/17 General:   Alert,  Well-developed, well-nourished, flat affect  Head:  Normocephalic and atraumatic. Eyes:  Sclera clear, no icterus.   Conjunctiva pink. Ears:  Normal auditory acuity. Nose:  No deformity, discharge,  or lesions. Lungs:  Clear throughout to auscultation.   Heart:  S1 S2 present without murmurs  Abdomen:  Wincing prior to me examining, rolling in bed with only light touch and without significant palpation. Abdomen soft. +BS. Tenderness reported entire abdomen.  Msk:  Symmetrical without gross deformities. Normal  posture. Extremities:  Without edema. Neurologic:  Alert and  oriented x4 Psych:  Flat affect, tearful.    Intake/Output from previous day: 06/02 0701 - 06/03 0700 In: 1800 [I.V.:1800] Out: -  Intake/Output this shift: No intake/output data recorded.  Lab Results: Recent Labs    08/08/17 0721 08/09/17 0623 08/10/17 0543  WBC 6.2 7.0 7.1  HGB 12.3 12.1 11.6*  HCT 38.2 37.3 35.6*  PLT 232 265 297   BMET Recent Labs    08/08/17 0721 08/09/17 0623 08/10/17 0543  NA 143 144 143  K 4.8 3.7 3.4*  CL 107 107 106  CO2 '30 30 30  ' GLUCOSE 97 98 98  BUN 7 8 5*  CREATININE 0.68 0.72 0.74  CALCIUM 9.0 8.8* 8.7*   LFT Recent Labs    08/08/17 0721 08/09/17 0623 08/10/17 0543  PROT 6.2* 6.1* 6.0*  ALBUMIN 2.9* 2.9* 2.9*  AST 200* 224* 82*  ALT 154* 216* 157*  ALKPHOS 179* 190* 190*  BILITOT 0.4 0.4 0.6   Lab Results  Component Value Date   LIPASE 20 08/05/2017     Impression: 36 year old female admitted with pyelonephritis and found to have new onset elevated transaminases and mild to moderately elevated alk phos several days into admission. US abdomen complete with evidence of prior cholecystectomy but no biliary ductal dilation or obvious CBD stones. Transaminases improving today. Acute hepatitis panel pending. Abdominal exam with diffuse tenderness but response out of proportion to degree of palpation. Interestingly, she also states she wants to go home. History of drug use but negative this admission for cocaine (last in 2016).  May need further serologies if hepatitis panel negative or increase in LFTs and/or abdominal pain noted. Will await hepatitis panel for now and continue to follow with you.     Plan: Await hepatitis panel in process Add Zofran scheduled before meals Further serologies and/or imaging if any increase in LFTs or no improvement Will continue to follow with you  Annitta Needs, PhD, ANP-BC [facility]      LOS: 5 days    08/10/2017, 1:29 PM

## 2017-08-10 NOTE — Progress Notes (Signed)
Pt was found leaving the unit to walk downstairs to meet daughters for food. Escorted back up to the floor by security and was reinforced once again that patient's are not allowed to leave the floor without a doctor's order. Pt verbalized negative thoughts about the rules, and wishes to leave, yet doesn't want to leave AMA. MD made aware that patient wants to leave, states probable D/C in the morning. Will continue to monitor.

## 2017-08-10 NOTE — Progress Notes (Signed)
Evidence of choledocholithiasis nor common bile duct dilatation based on sonography.  LFTs beginning to trend downward as of today still awaiting acute hepatitis serologies not in chart present she requested more pain medicine I explained to her why this is contraindicated with any possibility of common bile duct stagnation.  GI has seen her preliminarily and awaiting formal consult note at present [name] XBM:[id number] DOB: 01-22-1981 DOA: 08/05/2017 PCP: Angel Linsey, MD   Physical Exam: Blood pressure 120/61, pulse (!) 40, temperature 97.7 F (36.5 C), temperature source Oral, resp. rate 16, height 5\' 1"  (1.549 m), weight 54.4 kg (120 lb), SpO2 100 %.  Lungs clear to A&P no rales rhonchi heart regular rhythm no S3-S4 no heaves thrills rubs abdomen completely soft nontender bowel sounds normoactive no right upper quadrant tenderness this morning CVA tenderness elicited sounds normoactive no peristaltic rushes auscultated   Investigations:  Recent Results (from the past 240 hour(s))  Urine culture     Status: Abnormal   Collection Time: 08/05/17  7:30 AM  Result Value Ref Range Status   Specimen Description   Final    URINE, CLEAN CATCH Performed at [facility], [address], [place], [place] [address]    Special Requests   Final    NONE Performed at [facility], [address] [place], [place] [address]    Culture >=100,000 COLONIES/mL ESCHERICHIA COLI (A)  Final   Report Status 08/08/2017 FINAL  Final   Organism ID, Bacteria ESCHERICHIA COLI (A)  Final      Susceptibility   Escherichia coli - MIC*    AMPICILLIN >=32 RESISTANT Resistant     CEFAZOLIN <=4 SENSITIVE Sensitive     CEFTRIAXONE <=1 SENSITIVE Sensitive     CIPROFLOXACIN <=0.25 SENSITIVE Sensitive     GENTAMICIN <=1 SENSITIVE Sensitive     IMIPENEM <=0.25 SENSITIVE Sensitive     NITROFURANTOIN 64 INTERMEDIATE Intermediate     TRIMETH/SULFA <=20 SENSITIVE Sensitive     AMPICILLIN/SULBACTAM >=32 RESISTANT  Resistant     PIP/TAZO <=4 SENSITIVE Sensitive     Extended ESBL NEGATIVE Sensitive     * >=100,000 COLONIES/mL ESCHERICHIA COLI     Basic Metabolic Panel: Recent Labs    08/08/17 0721 08/09/17 0623 08/10/17 0543  NA 143 144 143  K 4.8 3.7 3.4*  CL 107 107 106  CO2 30 30 30   GLUCOSE 97 98 98  BUN 7 8 5*  CREATININE 0.68 0.72 0.74  CALCIUM 9.0 8.8* 8.7*  MG 1.5*  --   --    Liver Function Tests: Recent Labs    08/09/17 0623 08/10/17 0543  AST 224* 82*  ALT 216* 157*  ALKPHOS 190* 190*  BILITOT 0.4 0.6  PROT 6.1* 6.0*  ALBUMIN 2.9* 2.9*     CBC: Recent Labs    08/09/17 0623 08/10/17 0543  WBC 7.0 7.1  NEUTROABS 3.1 3.0  HGB 12.1 11.6*  HCT 37.3 35.6*  MCV 100.5* 101.1*  PLT 265 297    No results found.    Medications:   Impression:  Principal Problem:   Acute pyelonephritis Active Problems:   History of Narcotic abuse   Right flank pain   Leukocytosis   UTI (urinary tract infection)   Moderate Hydronephrosis of right kidney   Generalized anxiety disorder     Plan: Repeat hepatic profile in a.m. await gastroenterology expertise and opinion  Consultants: Gastroenterology requested   Procedures   Antibiotics: Levaquin 750 IV every 24 hours  Time spent: 30 minutes   LOS: 5 days   [name] M   08/10/2017, 1:30 PM

## 2017-08-11 ENCOUNTER — Other Ambulatory Visit: Payer: Self-pay

## 2017-08-11 ENCOUNTER — Telehealth: Payer: Self-pay | Admitting: Gastroenterology

## 2017-08-11 DIAGNOSIS — R945 Abnormal results of liver function studies: Principal | ICD-10-CM

## 2017-08-11 DIAGNOSIS — R7989 Other specified abnormal findings of blood chemistry: Secondary | ICD-10-CM

## 2017-08-11 LAB — CBC WITH DIFFERENTIAL/PLATELET
BASOS PCT: 1 %
Basophils Absolute: 0.1 10*3/uL (ref 0.0–0.1)
EOS ABS: 0.4 10*3/uL (ref 0.0–0.7)
Eosinophils Relative: 4 %
HCT: 38.5 % (ref 36.0–46.0)
HEMOGLOBIN: 12.5 g/dL (ref 12.0–15.0)
LYMPHS ABS: 4.2 10*3/uL — AB (ref 0.7–4.0)
Lymphocytes Relative: 41 %
MCH: 32.6 pg (ref 26.0–34.0)
MCHC: 32.5 g/dL (ref 30.0–36.0)
MCV: 100.3 fL — ABNORMAL HIGH (ref 78.0–100.0)
MONOS PCT: 7 %
Monocytes Absolute: 0.7 10*3/uL (ref 0.1–1.0)
NEUTROS PCT: 47 %
Neutro Abs: 4.9 10*3/uL (ref 1.7–7.7)
Platelets: 344 10*3/uL (ref 150–400)
RBC: 3.84 MIL/uL — ABNORMAL LOW (ref 3.87–5.11)
RDW: 12.1 % (ref 11.5–15.5)
WBC: 10.3 10*3/uL (ref 4.0–10.5)

## 2017-08-11 LAB — HEPATIC FUNCTION PANEL
ALBUMIN: 3.3 g/dL — AB (ref 3.5–5.0)
ALK PHOS: 187 U/L — AB (ref 38–126)
ALT: 121 U/L — ABNORMAL HIGH (ref 14–54)
AST: 40 U/L (ref 15–41)
BILIRUBIN INDIRECT: 0.2 mg/dL — AB (ref 0.3–0.9)
Bilirubin, Direct: 0.1 mg/dL (ref 0.1–0.5)
TOTAL PROTEIN: 6.6 g/dL (ref 6.5–8.1)
Total Bilirubin: 0.3 mg/dL (ref 0.3–1.2)

## 2017-08-11 LAB — HIV ANTIBODY (ROUTINE TESTING W REFLEX): HIV Screen 4th Generation wRfx: NONREACTIVE

## 2017-08-11 MED ORDER — LEVOFLOXACIN 500 MG PO TABS: 500.0000 mg | ORAL_TABLET | Freq: Every day | ORAL | 0 refills | Status: AC

## 2017-08-11 NOTE — Telephone Encounter (Signed)
See other phone note for today, DS tried to call pt. Awaiting return call.

## 2017-08-11 NOTE — Telephone Encounter (Signed)
Pt called to say she was D/C from the hospital this morning and needed OV to see SF this week. All I saw in her chart was she needed HFP/MRCP . She is asking what type of labs they were because Dr Janna Archondiego had also ordered labs to check her liver. I told her that she would need to speak with the nurse. Please call her at [phone]

## 2017-08-11 NOTE — Discharge Summary (Signed)
Physician Discharge Summary  [name] WUJ:[id number] DOB: 1981/01/02 DOA: 08/05/2017  PCP: Oval Linsey, MD  Admit date: 08/05/2017 Discharge date: 08/11/2017   Recommendations for Outpatient Follow-up:  The patient is advised to take Levaquin 500 mg p.o. daily for an additional 5 days post hospital is likewise urged to drink plenty of fluids to perfuse her kidney from the pyelonephritis she is likewise asked to avoid all alcohol due to her elevated liver function enzymes.  To follow-up in my office within 3 to 6 days time to assess liver function abnormalities and they are hopeful progression to normality Discharge Diagnoses:  Principal Problem:   Acute pyelonephritis Active Problems:   History of Narcotic abuse   Right flank pain   Leukocytosis   UTI (urinary tract infection)   Moderate Hydronephrosis of right kidney   Generalized anxiety disorder   Discharge Condition: Good  Filed Weights   08/05/17 0353  Weight: 54.4 kg (120 lb)    History of present illness:  The patient is a 37 year old white female with a history of depression anxiety admitted to the hospital with an acute pyelonephritis with severe CVA tenderness from partial hydronephrosis of right kidney no nephrolithiasis noted on CT scan on admission was possibility of the stone could have passed recently she did have pyuria and some hematuria on urine analysis she was elected to be treated with IV intravenous Levaquin she had cyst considerable CVA tenderness right lower quadrant tenderness and is resolved gradually over 5 days time on the fourth fifth hospital day she started to have significant elevation of her alkaline phosphatase as well as her liver function tests she is status post cholecystectomy consideration was given to choledocholithiasis abdominal sonogram revealed no evidence of common bile duct dilatation stenosis or stone acute hepatitis serologies were performed they were all negative she was seen in  consultation by gastroenterology who felt watchful waiting is the course and her liver function test began to recent towards normal therefore she was subsequently discharged to follow-up in the office in 3 to 6 days time to assess her liver function tests  Hospital Course:  See HPI above  Procedures:    Consultations:  [facility] enterology  Discharge Instructions  Discharge Instructions    Discharge instructions   Complete by:  As directed    Discharge patient   Complete by:  As directed    Discharge disposition:  01-Home or Self Care   Discharge patient date:  08/11/2017     Allergies as of 08/11/2017      Reactions   Fentanyl Itching   Lyrica [pregabalin] Other (See Comments)   Causes her to pass out   Morphine And Related Itching   Nsaids Other (See Comments)   GI upset   Tramadol Other (See Comments)   Seizures   Divalproex Sodium Anxiety, Other (See Comments)   'makes me feel really weird' , felt distant from reality   Fioricet-codeine [butalbital-apap-caff-cod] Itching   Ibuprofen Other (See Comments)   GI upset, stomach pain   Ketorolac Tromethamine Hives, Other (See Comments)   Stomach pain      Medication List    STOP taking these medications   aspirin-acetaminophen-caffeine 250-250-65 MG tablet Commonly known as:  EXCEDRIN MIGRAINE   diphenhydrAMINE 25 mg capsule Commonly known as:  BENADRYL   LORazepam 2 MG tablet Commonly known as:  ATIVAN     TAKE these medications   amphetamine-dextroamphetamine 20 MG tablet Commonly known as:  ADDERALL Take 20 mg by  mouth daily.   estradiol 0.1 MG/GM vaginal cream Commonly known as:  ESTRACE VAGINAL Place 1 Applicatorful vaginally at bedtime.   levofloxacin 500 MG tablet Commonly known as:  LEVAQUIN Take 1 tablet (500 mg total) by mouth daily for 5 days.   oxyCODONE-acetaminophen 5-325 MG tablet Commonly known as:  PERCOCET/ROXICET Take 1-2 tablets by mouth every 4 (four) hours as needed for moderate pain  or severe pain (Postoperative pain). As needed for postoperative pain. May alternate with tramadol   TRINTELLIX 5 MG Tabs tablet Generic drug:  vortioxetine HBr Take 5 mg by mouth daily.      Allergies  Allergen Reactions  . Fentanyl Itching  . Lyrica [Pregabalin] Other (See Comments)    Causes her to pass out  . Morphine And Related Itching  . Nsaids Other (See Comments)    GI upset  . Tramadol Other (See Comments)    Seizures   . Divalproex Sodium Anxiety and Other (See Comments)    'makes me feel really weird' , felt distant from reality  . Fioricet-Codeine [Butalbital-Apap-Caff-Cod] Itching  . Ibuprofen Other (See Comments)    GI upset, stomach pain  . Ketorolac Tromethamine Hives and Other (See Comments)    Stomach pain       The results of significant diagnostics from this hospitalization (including imaging, microbiology, ancillary and laboratory) are listed below for reference.    Significant Diagnostic Studies: US Abdomen Complete  Result Date: 08/08/2017 CLINICAL DATA:  37 year old female with a history of prior cholecystectomy. Evaluate for choledocholithiasis. EXAM: ABDOMEN ULTRASOUND COMPLETE COMPARISON:  CT scan of the abdomen and pelvis 08/05/2017 FINDINGS: Gallbladder: The gallbladder is surgically absent. Common bile duct: Diameter: Within normal limits measuring between 4 and 6 mm. No choledocholithiasis visualized. Liver: No focal lesion identified. Within normal limits in parenchymal echogenicity. Portal vein is patent on color Doppler imaging with normal direction of blood flow towards the liver. IVC: No abnormality visualized. Pancreas: Visualized portion unremarkable. Spleen: Size and appearance within normal limits. Right Kidney: Length: 10.1 cm. Echogenicity within normal limits. No mass or hydronephrosis visualized. Left Kidney: Length: 10.4 cm. Echogenicity within normal limits. No mass or hydronephrosis visualized. Abdominal aorta: No aneurysm visualized.  Other findings: None. IMPRESSION: 1. Surgical changes of prior cholecystectomy without evidence of biliary ductal dilatation or visualized choledocholithiasis. 2. Otherwise, unremarkable abdominal ultrasound. Electronically Signed   By: Malachy Moan Woods.D.   On: 08/08/2017 11:35   US Renal  Result Date: 08/06/2017 CLINICAL DATA:  Flank region pain EXAM: RENAL / URINARY TRACT ULTRASOUND COMPLETE COMPARISON:  Aug 05, 2017 CT abdomen and pelvis FINDINGS: Right Kidney: Length: 10.1 cm. Echogenicity and renal cortical thickness are within normal limits. No mass, perinephric fluid, or hydronephrosis visualized. No sonographically demonstrable calculus or ureterectasis. Left Kidney: Length: 10.4 cm. Echogenicity and renal cortical thickness are within normal limits. No mass, perinephric fluid, or hydronephrosis visualized. No sonographically demonstrable calculus or ureterectasis. Bladder: Appears normal for degree of bladder distention. IMPRESSION: Study within normal limits. Previously noted hydronephrosis on the right is no longer appreciable. Suspect recent calculus passage on the right given current appearance by ultrasound compared to CT appearance 1 day prior. Electronically Signed   By: Bretta Bang III Woods.D.   On: 08/06/2017 10:53   Ct Renal Stone Study  Result Date: 08/05/2017 CLINICAL DATA:  Right flank region pain EXAM: CT ABDOMEN AND PELVIS WITHOUT CONTRAST TECHNIQUE: Multidetector CT imaging of the abdomen and pelvis was performed following the standard protocol without oral or  IV contrast. COMPARISON:  March 24, 2017 FINDINGS: Lower chest: Lung bases are clear. Hepatobiliary: There is apparent fatty infiltration near the fissure for the ligamentum teres. No liver lesions beyond apparent fatty infiltration noted on this noncontrast enhanced study. Gallbladder is absent. There is no biliary duct dilatation. Pancreas: There is no pancreatic mass or inflammatory focus. Spleen: No splenic lesions  are evident. Adrenals/Urinary Tract: Adrenals bilaterally appear normal. The right kidney appears edematous. There is no renal mass on either side. There is moderate hydronephrosis on the right. There is no hydronephrosis on the left. There is no intrarenal calculus on either side. There is edema along the course of much of the right ureter. No ureteral calculi are evident on either side. Urinary bladder is midline with wall thickness within normal limits. Stomach/Bowel: There is no appreciable bowel wall or mesenteric thickening. No bowel obstruction. No free air or portal venous air. Vascular/Lymphatic: There is no abdominal aortic aneurysm. No vascular lesions are evident on this study. There is no adenopathy in the abdomen or pelvis. Reproductive: Uterus is absent.  No evident pelvic mass. Other: There are calcifications in right lower quadrant, stable. Appendix appears unremarkable. There is no periappendiceal region inflammation. There is no abscess or ascites in the abdomen or pelvis. There is a minimal ventral hernia containing only fat. Musculoskeletal: There are no blastic or lytic bone lesions. There is no intramuscular or abdominal wall lesion. IMPRESSION: 1. Right kidney is edematous. There is moderate hydronephrosis on the right. There is edema involving much of the right ureter. No ureteral calculus is appreciable on this study. Question recent calculus passage. Pyelonephritis is a differential consideration. No renal abscess or localized lobar nephronia evident by CT. 2. No bowel obstruction. No abscess. No appendiceal region inflammation. Stable small calcifications in the right lower quadrant, separate from the appendix. 3. Apparent fatty infiltration in the liver near the fissure for the ligamentum teres, also present on prior study. Gallbladder absent. 4.  Uterus absent. Electronically Signed   By: Bretta Bang III Woods.D.   On: 08/05/2017 09:01    Microbiology: Recent Results (from the  past 240 hour(s))  Urine culture     Status: Abnormal   Collection Time: 08/05/17  7:30 AM  Result Value Ref Range Status   Specimen Description   Final    URINE, CLEAN CATCH Performed at [facility], [address], [place], [place] [address]    Special Requests   Final    NONE Performed at [facility], [address], [place], [place] [address]    Culture >=100,000 COLONIES/mL ESCHERICHIA COLI (A)  Final   Report Status 08/08/2017 FINAL  Final   Organism ID, Bacteria ESCHERICHIA COLI (A)  Final      Susceptibility   Escherichia coli - MIC*    AMPICILLIN >=32 RESISTANT Resistant     CEFAZOLIN <=4 SENSITIVE Sensitive     CEFTRIAXONE <=1 SENSITIVE Sensitive     CIPROFLOXACIN <=0.25 SENSITIVE Sensitive     GENTAMICIN <=1 SENSITIVE Sensitive     IMIPENEM <=0.25 SENSITIVE Sensitive     NITROFURANTOIN 64 INTERMEDIATE Intermediate     TRIMETH/SULFA <=20 SENSITIVE Sensitive     AMPICILLIN/SULBACTAM >=32 RESISTANT Resistant     PIP/TAZO <=4 SENSITIVE Sensitive     Extended ESBL NEGATIVE Sensitive     * >=100,000 COLONIES/mL ESCHERICHIA COLI     Labs: Basic Metabolic Panel: Recent Labs  Lab 08/06/17 0435 08/07/17 0611 08/08/17 0721 08/09/17 0623 08/10/17 0543  NA 138 139  143 144 143  K 4.3 4.1 4.8 3.7 3.4*  CL 101 103 107 107 106  CO2 31 31 30 30 30   GLUCOSE 97 105* 97 98 98  BUN 7 5* 7 8 5*  CREATININE 0.71 0.69 0.68 0.72 0.74  CALCIUM 8.4* 8.7* 9.0 8.8* 8.7*  MG 1.4* 1.5* 1.5*  --   --    Liver Function Tests: Recent Labs  Lab 08/07/17 0611 08/08/17 0721 08/09/17 0623 08/10/17 0543 08/11/17 0525  AST 75* 200* 224* 82* 40  ALT 52 154* 216* 157* 121*  ALKPHOS 138* 179* 190* 190* 187*  BILITOT 0.5 0.4 0.4 0.6 0.3  PROT 6.0* 6.2* 6.1* 6.0* 6.6  ALBUMIN 2.8* 2.9* 2.9* 2.9* 3.3*   Recent Labs  Lab 08/05/17 0709  LIPASE 20   No results for input(s): AMMONIA in the last 168 hours. CBC: Recent Labs  Lab 08/07/17 0611 08/08/17 0721 08/09/17 0623  08/10/17 0543 08/11/17 0525  WBC 7.3 6.2 7.0 7.1 10.3  NEUTROABS 3.1 2.5 3.1 3.0 4.9  HGB 11.8* 12.3 12.1 11.6* 12.5  HCT 36.9 38.2 37.3 35.6* 38.5  MCV 101.1* 101.1* 100.5* 101.1* 100.3*  PLT 215 232 265 297 344   Cardiac Enzymes: No results for input(s): CKTOTAL, CKMB, CKMBINDEX, TROPONINI in the last 168 hours. BNP: BNP (last 3 results) No results for input(s): BNP in the last 8760 hours.  ProBNP (last 3 results) No results for input(s): PROBNP in the last 8760 hours.  CBG: No results for input(s): GLUCAP in the last 168 hours.     Signed:  Isabella StallingDONDIEGO,[name]   Pager: [phone]: [phone] 08/11/2017, 6:55 AM

## 2017-08-11 NOTE — Telephone Encounter (Signed)
LMOM for a return call.  

## 2017-08-11 NOTE — Progress Notes (Signed)
[name] discharged Home per MD order.  Discharge instructions reviewed and discussed with the patient, all questions and concerns answered. Copy of instructions and scripts given to patient.  Allergies as of 08/11/2017      Reactions   Fentanyl Itching   Lyrica [pregabalin] Other (See Comments)   Causes her to pass out   Morphine And Related Itching   Nsaids Other (See Comments)   GI upset   Tramadol Other (See Comments)   Seizures   Divalproex Sodium Anxiety, Other (See Comments)   'makes me feel really weird' , felt distant from reality   Fioricet-codeine [butalbital-apap-caff-cod] Itching   Ibuprofen Other (See Comments)   GI upset, stomach pain   Ketorolac Tromethamine Hives, Other (See Comments)   Stomach pain      Medication List    STOP taking these medications   aspirin-acetaminophen-caffeine 250-250-65 MG tablet Commonly known as:  EXCEDRIN MIGRAINE   diphenhydrAMINE 25 mg capsule Commonly known as:  BENADRYL   LORazepam 2 MG tablet Commonly known as:  ATIVAN     TAKE these medications   amphetamine-dextroamphetamine 20 MG tablet Commonly known as:  ADDERALL Take 20 mg by mouth daily.   estradiol 0.1 MG/GM vaginal cream Commonly known as:  ESTRACE VAGINAL Place 1 Applicatorful vaginally at bedtime.   levofloxacin 500 MG tablet Commonly known as:  LEVAQUIN Take 1 tablet (500 mg total) by mouth daily for 5 days.   oxyCODONE-acetaminophen 5-325 MG tablet Commonly known as:  PERCOCET/ROXICET Take 1-2 tablets by mouth every 4 (four) hours as needed for moderate pain or severe pain (Postoperative pain). As needed for postoperative pain. May alternate with tramadol   TRINTELLIX 5 MG Tabs tablet Generic drug:  vortioxetine HBr Take 5 mg by mouth daily.       Patients skin is clean, dry and intact, no evidence of skin break down. IV site discontinued and catheter remains intact. Site without signs and symptoms of complications. Dressing and pressure  applied.  Patient ambulated to the elevator,  no distress noted upon discharge.  Angel KoyanagiBonnie M Annalena Woods 08/11/2017 8:24 AM

## 2017-08-12 NOTE — Telephone Encounter (Signed)
MRCP scheduled for 08/17/17 at 1:00pm, npo 4 hrs prior to test.  Spoke with patient and is aware of appt details. She voiced understanding.  Called [employer] and was advised no PA is required

## 2017-08-12 NOTE — Addendum Note (Signed)
Addended by: Tommie SamsSILVA, MINDY S on: 08/12/2017 01:14 PM   Modules accepted: Orders

## 2017-08-12 NOTE — Telephone Encounter (Signed)
See prior phone note. 

## 2017-08-12 NOTE — Telephone Encounter (Signed)
Pt is aware that Dr.Fields is ordering Hepatic Function Panel and order is in at [facility]. She said Dr. Delbert Harnesson Diego has ordered the same. I told her she can let the lab know since she did not have a written copy for the orders. Ok to schedule the MRCP, but she is claustrophobic.  Forwarding to [facility] to schedule.

## 2017-08-17 ENCOUNTER — Ambulatory Visit (HOSPITAL_COMMUNITY): Admission: RE | Admit: 2017-08-17 | Payer: BLUE CROSS/BLUE SHIELD | Source: Ambulatory Visit

## 2017-11-01 ENCOUNTER — Encounter: Payer: Self-pay | Admitting: Obstetrics & Gynecology

## 2017-12-28 ENCOUNTER — Other Ambulatory Visit: Payer: Self-pay | Admitting: Family Medicine

## 2017-12-28 DIAGNOSIS — R945 Abnormal results of liver function studies: Principal | ICD-10-CM

## 2017-12-28 DIAGNOSIS — R7989 Other specified abnormal findings of blood chemistry: Secondary | ICD-10-CM

## 2018-01-08 ENCOUNTER — Ambulatory Visit (HOSPITAL_COMMUNITY)
Admission: RE | Admit: 2018-01-08 | Discharge: 2018-01-08 | Disposition: A | Discharge status: Home / Self Care | Payer: BLUE CROSS/BLUE SHIELD | Source: Ambulatory Visit | Attending: Family Medicine | Admitting: Family Medicine

## 2018-01-08 ENCOUNTER — Encounter (HOSPITAL_COMMUNITY): Payer: Self-pay

## 2018-06-11 ENCOUNTER — Ambulatory Visit (INDEPENDENT_AMBULATORY_CARE_PROVIDER_SITE_OTHER): Payer: BLUE CROSS/BLUE SHIELD | Admitting: Obstetrics & Gynecology

## 2018-06-11 ENCOUNTER — Other Ambulatory Visit: Payer: Self-pay

## 2018-06-11 ENCOUNTER — Encounter: Payer: Self-pay | Admitting: Obstetrics & Gynecology

## 2018-06-11 VITALS — BP 94/62 | HR 68 | Ht 61.0 in | Wt 116.8 lb

## 2018-06-11 DIAGNOSIS — A6009 Herpesviral infection of other urogenital tract: Secondary | ICD-10-CM

## 2018-06-11 MED ORDER — VALACYCLOVIR HCL 1 G PO TABS: 1000.0000 mg | ORAL_TABLET | Freq: Two times a day (BID) | ORAL | 11 refills | Status: AC

## 2018-06-11 NOTE — Progress Notes (Signed)
Chief Complaint  Patient presents with  . ingrown hair    pubic area      37 y.o. [id number] No LMP recorded. Patient has had a hysterectomy. The current method of family planning is status post hysterectomy.  Outpatient Encounter Medications as of 06/11/2018  Medication Sig Note  . amphetamine-dextroamphetamine (ADDERALL) 20 MG tablet Take 20 mg by mouth daily.  01/20/2017: Filled today but has not picked up via pharmacy records  . vortioxetine HBr (TRINTELLIX) 5 MG TABS tablet Take 5 mg by mouth daily.   Marland Kitchen estradiol (ESTRACE VAGINAL) 0.1 MG/GM vaginal cream Place 1 Applicatorful vaginally at bedtime. (Patient not taking: Reported on 06/11/2018)   . oxyCODONE-acetaminophen (PERCOCET/ROXICET) 5-325 MG tablet Take 1-2 tablets by mouth every 4 (four) hours as needed for moderate pain or severe pain (Postoperative pain). As needed for postoperative pain. May alternate with tramadol (Patient not taking: Reported on 06/11/2018)   . valACYclovir (VALTREX) 1000 MG tablet Take 1 tablet (1,000 mg total) by mouth 2 (two) times daily.   . [DISCONTINUED] POTASSIUM PO Take 1 tablet by mouth daily.      No facility-administered encounter medications on file as of 06/11/2018.     Subjective Pt has had a painful area on her bottom that has been present for 5 days or so The pain has been sharp moderate intensity Has history of similar lesion Past Medical History:  Diagnosis Date  . Anxiety   . Chronic back pain   . Chronic headache   . Chronic pain   . Chronic pelvic pain in female   . Cocaine use   . Endometriosis   . Heart murmur    as a child  . Herpes genitalis in women   . Hx of opioid abuse (HCC)   . Hypokalemia   . Narcotic abuse (HCC)    5 yrs ago lortab, off few yrs  . Pneumonia    38 years  old  . Renal disorder Cyst on Kidney    Past Surgical History:  Procedure Laterality Date  . ABDOMINAL HYSTERECTOMY  2007   left ovary remains  . CARPAL TUNNEL RELEASE     both  .  CHOLECYSTECTOMY  2010  . INCISION AND DRAINAGE ABSCESS N/A 07/14/2014   Procedure: INCISION AND DRAINAGE VULVAR ABSCESS;  Surgeon: [name], MD;  Location: [facility];  Service: Gynecology;  Laterality: N/A;  . LAPAROSCOPIC SALPINGO OOPHERECTOMY Left 08/15/2014   Procedure: LAPAROSCOPIC LEFT SALPINGO OOPHORECTOMY;  Surgeon: [name], MD;  Location: [facility];  Service: Gynecology;  Laterality: Left;  . TONSILLECTOMY    . TUBAL LIGATION      OB History    Gravida  4   Para  4   Term  4   Preterm      AB      Living  4     SAB      TAB      Ectopic      Multiple      Live Births              Allergies  Allergen Reactions  . Fentanyl Itching  . Lyrica [Pregabalin] Other (See Comments)    Causes her to pass out  . Morphine And Related Itching  . Nsaids Other (See Comments)    GI upset  . Tramadol Other (See Comments)    Seizures   . Divalproex Sodium Anxiety and Other (See Comments)    'makes  me feel really weird' , felt distant from reality  . Fioricet-Codeine [Butalbital-Apap-Caff-Cod] Itching  . Ibuprofen Other (See Comments)    GI upset, stomach pain  . Ketorolac Tromethamine Hives and Other (See Comments)    Stomach pain     Social History   Socioeconomic History  . Marital status: Married    Spouse name: Not on file  . Number of children: 4  . Years of education: Not on file  . Highest education level: Not on file  Occupational History  . Occupation: homemaker  Social Needs  . Financial resource strain: Not on file  . Food insecurity:    Worry: Not on file    Inability: Not on file  . Transportation needs:    Medical: Not on file    Non-medical: Not on file  Tobacco Use  . Smoking status: Current Every Day Smoker    Packs/day: 1.00    Years: 18.00    Pack years: 18.00    Types: Cigarettes  . Smokeless tobacco: Never Used  Substance and Sexual Activity  . Alcohol use: No    Alcohol/week: 0.0 standard drinks  . Drug use: No     Types: Cocaine    Comment: no drugs for 3 years 05/2014  . Sexual activity: Yes    Partners: Male    Birth control/protection: Surgical    Comment: hyst  Lifestyle  . Physical activity:    Days per week: Not on file    Minutes per session: Not on file  . Stress: Not on file  Relationships  . Social connections:    Talks on phone: Not on file    Gets together: Not on file    Attends religious service: Not on file    Active member of club or organization: Not on file    Attends meetings of clubs or organizations: Not on file    Relationship status: Not on file  Other Topics Concern  . Not on file  Social History Narrative  . Not on file    Family History  Problem Relation Age of Onset  . Stroke Father   . Diverticulitis Father   . Alzheimer's disease Father   . Colon polyps Father        diagnosed in 78s-70s   . Hypertension Mother   . Diabetes Mother   . Heart disease Other   . Arthritis Other   . Diabetes Other   . Kidney disease Other   . Heart attack Paternal Grandfather   . Alzheimer's disease Paternal Grandmother   . Heart attack Maternal Grandfather   . Liver disease Neg Hx   . Colon cancer Neg Hx     Medications:       Current Outpatient Medications:  .  amphetamine-dextroamphetamine (ADDERALL) 20 MG tablet, Take 20 mg by mouth daily. , Disp: , Rfl:  .  vortioxetine HBr (TRINTELLIX) 5 MG TABS tablet, Take 5 mg by mouth daily., Disp: , Rfl:  .  estradiol (ESTRACE VAGINAL) 0.1 MG/GM vaginal cream, Place 1 Applicatorful vaginally at bedtime. (Patient not taking: Reported on 06/11/2018), Disp: 30 g, Rfl: 11 .  oxyCODONE-acetaminophen (PERCOCET/ROXICET) 5-325 MG tablet, Take 1-2 tablets by mouth every 4 (four) hours as needed for moderate pain or severe pain (Postoperative pain). As needed for postoperative pain. May alternate with tramadol (Patient not taking: Reported on 06/11/2018), Disp: 10 tablet, Rfl: 0 .  valACYclovir (VALTREX) 1000 MG tablet, Take 1 tablet  (1,000 mg total) by mouth 2 (two)  times daily., Disp: 20 tablet, Rfl: 11  Objective Blood pressure 94/62, pulse 68, height 5\' 1"  (1.549 m), weight 116 lb 12.8 oz (53 kg). General WDWN female NAD Vulva:  Ulcerative lesion on perineum just to the right of the midline Vagina:   Cervix:   Uterus:   Adnexa: ovaries:,      Pertinent ROS No burning with urination, frequency or urgency No nausea, vomiting or diarrhea Nor fever chills or other constitutional symptoms   Labs or studies     Impression Diagnoses this Encounter::   ICD-10-CM   1. 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 valACYclovir (VALTREX) 1000 MG tablet    Sig: Take 1 tablet (1,000 mg total) by mouth 2 (two) times daily.    Dispense:  20 tablet    Refill:  11    Labs or Scans Ordered: No orders of the defined types were placed in this encounter.   Management:: >acyclovir for acute outbreak refills for recurrences given  Follow up Return if symptoms worsen or fail to improve.        All questions were answered.

## 2019-09-30 NOTE — Telephone Encounter (Signed)
Error

## 2021-12-30 DIAGNOSIS — R69 Illness, unspecified: Secondary | ICD-10-CM | POA: Diagnosis not present
